# Patient Record
Sex: Male | Born: 1950 | Race: White | Hispanic: No | Marital: Married | Smoking: Former smoker
Health system: Southern US, Community
[De-identification: ages and names within clinical notes are randomized; demographics above are authoritative.]

## PROBLEM LIST (undated history)

## (undated) DIAGNOSIS — I499 Cardiac arrhythmia, unspecified: Secondary | ICD-10-CM

## (undated) DIAGNOSIS — T7840XA Allergy, unspecified, initial encounter: Secondary | ICD-10-CM

## (undated) DIAGNOSIS — I209 Angina pectoris, unspecified: Secondary | ICD-10-CM

## (undated) DIAGNOSIS — N400 Enlarged prostate without lower urinary tract symptoms: Secondary | ICD-10-CM

## (undated) DIAGNOSIS — I4891 Unspecified atrial fibrillation: Secondary | ICD-10-CM

## (undated) DIAGNOSIS — J309 Allergic rhinitis, unspecified: Secondary | ICD-10-CM

## (undated) DIAGNOSIS — F528 Other sexual dysfunction not due to a substance or known physiological condition: Secondary | ICD-10-CM

## (undated) DIAGNOSIS — I219 Acute myocardial infarction, unspecified: Secondary | ICD-10-CM

## (undated) DIAGNOSIS — I10 Essential (primary) hypertension: Secondary | ICD-10-CM

## (undated) DIAGNOSIS — I739 Peripheral vascular disease, unspecified: Secondary | ICD-10-CM

## (undated) DIAGNOSIS — G2581 Restless legs syndrome: Principal | ICD-10-CM

## (undated) DIAGNOSIS — G4700 Insomnia, unspecified: Secondary | ICD-10-CM

## (undated) DIAGNOSIS — J449 Chronic obstructive pulmonary disease, unspecified: Secondary | ICD-10-CM

## (undated) DIAGNOSIS — N3941 Urge incontinence: Secondary | ICD-10-CM

## (undated) DIAGNOSIS — Z86010 Personal history of colonic polyps: Secondary | ICD-10-CM

## (undated) DIAGNOSIS — E039 Hypothyroidism, unspecified: Secondary | ICD-10-CM

## (undated) DIAGNOSIS — K219 Gastro-esophageal reflux disease without esophagitis: Secondary | ICD-10-CM

## (undated) DIAGNOSIS — E785 Hyperlipidemia, unspecified: Secondary | ICD-10-CM

## (undated) DIAGNOSIS — M1710 Unilateral primary osteoarthritis, unspecified knee: Secondary | ICD-10-CM

## (undated) DIAGNOSIS — J189 Pneumonia, unspecified organism: Secondary | ICD-10-CM

## (undated) DIAGNOSIS — R740 Nonspecific elevation of levels of transaminase and lactic acid dehydrogenase [LDH]: Secondary | ICD-10-CM

## (undated) DIAGNOSIS — M1990 Unspecified osteoarthritis, unspecified site: Secondary | ICD-10-CM

## (undated) HISTORY — PX: OTHER SURGICAL HISTORY: SHX169

## (undated) HISTORY — DX: Gastro-esophageal reflux disease without esophagitis: K21.9

## (undated) HISTORY — DX: Unspecified atrial fibrillation: I48.91

## (undated) HISTORY — DX: Hypothyroidism, unspecified: E03.9

## (undated) HISTORY — PX: SHOULDER SURGERY: SHX246

## (undated) HISTORY — DX: Unspecified osteoarthritis, unspecified site: M19.90

## (undated) HISTORY — DX: Allergic rhinitis, unspecified: J30.9

## (undated) HISTORY — PX: UPPER GASTROINTESTINAL ENDOSCOPY: SHX188

## (undated) HISTORY — DX: Insomnia, unspecified: G47.00

## (undated) HISTORY — DX: Benign prostatic hyperplasia without lower urinary tract symptoms: N40.0

## (undated) HISTORY — DX: Allergy, unspecified, initial encounter: T78.40XA

## (undated) HISTORY — DX: Restless legs syndrome: G25.81

## (undated) HISTORY — PX: COLONOSCOPY: SHX174

## (undated) HISTORY — DX: Unilateral primary osteoarthritis, unspecified knee: M17.10

## (undated) HISTORY — PX: POLYPECTOMY: SHX149

## (undated) HISTORY — DX: Personal history of colonic polyps: Z86.010

## (undated) HISTORY — DX: Urge incontinence: N39.41

## (undated) HISTORY — PX: HERNIA REPAIR: SHX51

## (undated) HISTORY — DX: Other sexual dysfunction not due to a substance or known physiological condition: F52.8

## (undated) HISTORY — DX: Hyperlipidemia, unspecified: E78.5

## (undated) HISTORY — DX: Nonspecific elevation of levels of transaminase and lactic acid dehydrogenase (ldh): R74.0

---

## 1993-12-28 LAB — HM SIGMOIDOSCOPY: HM Sigmoidoscopy: NORMAL

## 2000-07-31 ENCOUNTER — Ambulatory Visit (HOSPITAL_COMMUNITY): Admission: RE | Admit: 2000-07-31 | Discharge: 2000-07-31 | Payer: Self-pay | Admitting: Rheumatology

## 2000-07-31 ENCOUNTER — Encounter: Payer: Self-pay | Admitting: Rheumatology

## 2003-10-03 ENCOUNTER — Inpatient Hospital Stay (HOSPITAL_COMMUNITY): Admission: EM | Admit: 2003-10-03 | Discharge: 2003-10-05 | Payer: Self-pay | Admitting: *Deleted

## 2003-10-07 ENCOUNTER — Ambulatory Visit (HOSPITAL_COMMUNITY): Admission: RE | Admit: 2003-10-07 | Discharge: 2003-10-07 | Payer: Self-pay | Admitting: Internal Medicine

## 2004-03-10 ENCOUNTER — Emergency Department (HOSPITAL_COMMUNITY): Admission: EM | Admit: 2004-03-10 | Discharge: 2004-03-10 | Payer: Self-pay | Admitting: Emergency Medicine

## 2004-10-30 ENCOUNTER — Encounter: Admission: RE | Admit: 2004-10-30 | Discharge: 2004-10-30 | Payer: Self-pay | Admitting: Orthopedic Surgery

## 2004-11-24 ENCOUNTER — Encounter: Admission: RE | Admit: 2004-11-24 | Discharge: 2004-11-24 | Payer: Self-pay | Admitting: Orthopedic Surgery

## 2005-01-18 ENCOUNTER — Ambulatory Visit: Payer: Self-pay | Admitting: Internal Medicine

## 2005-01-22 ENCOUNTER — Ambulatory Visit: Payer: Self-pay | Admitting: Cardiology

## 2006-03-13 ENCOUNTER — Ambulatory Visit: Payer: Self-pay | Admitting: Internal Medicine

## 2006-03-18 ENCOUNTER — Ambulatory Visit: Payer: Self-pay | Admitting: Internal Medicine

## 2006-03-27 ENCOUNTER — Ambulatory Visit: Payer: Self-pay

## 2007-04-29 ENCOUNTER — Ambulatory Visit: Payer: Self-pay | Admitting: Internal Medicine

## 2007-04-29 LAB — CONVERTED CEMR LAB
ALT: 19 units/L (ref 0–53)
AST: 20 units/L (ref 0–37)
Albumin: 3.9 g/dL (ref 3.5–5.2)
Alkaline Phosphatase: 90 units/L (ref 39–117)
BUN: 7 mg/dL (ref 6–23)
Basophils Absolute: 0.1 10*3/uL (ref 0.0–0.1)
Basophils Relative: 1 % (ref 0.0–1.0)
Bilirubin Urine: NEGATIVE
Bilirubin, Direct: 0.1 mg/dL (ref 0.0–0.3)
CO2: 27 meq/L (ref 19–32)
Calcium: 9.4 mg/dL (ref 8.4–10.5)
Chloride: 104 meq/L (ref 96–112)
Cholesterol: 227 mg/dL (ref 0–200)
Creatinine, Ser: 0.8 mg/dL (ref 0.4–1.5)
Direct LDL: 123.7 mg/dL
Eosinophils Absolute: 0.2 10*3/uL (ref 0.0–0.6)
Eosinophils Relative: 2 % (ref 0.0–5.0)
GFR calc Af Amer: 129 mL/min
GFR calc non Af Amer: 107 mL/min
Glucose, Bld: 98 mg/dL (ref 70–99)
HCT: 44.1 % (ref 39.0–52.0)
HDL: 34.7 mg/dL — ABNORMAL LOW (ref >39.0)
Hemoglobin, Urine: NEGATIVE
Hemoglobin: 15.4 g/dL (ref 13.0–17.0)
Ketones, ur: NEGATIVE mg/dL
Leukocytes, UA: NEGATIVE
Lymphocytes Relative: 34.5 % (ref 12.0–46.0)
MCHC: 34.9 g/dL (ref 30.0–36.0)
MCV: 92.1 fL (ref 78.0–100.0)
Monocytes Absolute: 0.8 10*3/uL — ABNORMAL HIGH (ref 0.2–0.7)
Monocytes Relative: 9.5 % (ref 3.0–11.0)
Neutro Abs: 4.1 10*3/uL (ref 1.4–7.7)
Neutrophils Relative %: 53 % (ref 43.0–77.0)
Nitrite: NEGATIVE
PSA: 0.75 ng/mL (ref 0.10–4.00)
Platelets: 285 10*3/uL (ref 150–400)
Potassium: 4.3 meq/L (ref 3.5–5.1)
RBC: 4.79 M/uL (ref 4.22–5.81)
RDW: 12.2 % (ref 11.5–14.6)
Sodium: 139 meq/L (ref 135–145)
Specific Gravity, Urine: 1.01 (ref 1.000–1.03)
TSH: 22.58 microintl units/mL — ABNORMAL HIGH (ref 0.35–5.50)
Total Bilirubin: 0.7 mg/dL (ref 0.3–1.2)
Total CHOL/HDL Ratio: 6.5
Total Protein, Urine: NEGATIVE mg/dL
Total Protein: 7.1 g/dL (ref 6.0–8.3)
Triglycerides: 391 mg/dL (ref 0–149)
Urine Glucose: NEGATIVE mg/dL
Urobilinogen, UA: 0.2 (ref 0.0–1.0)
VLDL: 78 mg/dL — ABNORMAL HIGH (ref 0–40)
WBC: 8 10*3/uL (ref 4.5–10.5)
pH: 6.5 (ref 5.0–8.0)

## 2007-05-06 ENCOUNTER — Ambulatory Visit: Payer: Self-pay | Admitting: Internal Medicine

## 2007-05-06 DIAGNOSIS — R5381 Other malaise: Secondary | ICD-10-CM | POA: Insufficient documentation

## 2007-05-06 DIAGNOSIS — E039 Hypothyroidism, unspecified: Secondary | ICD-10-CM

## 2007-05-06 DIAGNOSIS — IMO0002 Reserved for concepts with insufficient information to code with codable children: Secondary | ICD-10-CM

## 2007-05-06 DIAGNOSIS — M545 Low back pain, unspecified: Secondary | ICD-10-CM | POA: Insufficient documentation

## 2007-05-06 DIAGNOSIS — J309 Allergic rhinitis, unspecified: Secondary | ICD-10-CM | POA: Insufficient documentation

## 2007-05-06 DIAGNOSIS — K219 Gastro-esophageal reflux disease without esophagitis: Secondary | ICD-10-CM | POA: Insufficient documentation

## 2007-05-06 DIAGNOSIS — E785 Hyperlipidemia, unspecified: Secondary | ICD-10-CM | POA: Insufficient documentation

## 2007-05-06 DIAGNOSIS — M1710 Unilateral primary osteoarthritis, unspecified knee: Secondary | ICD-10-CM

## 2007-05-06 DIAGNOSIS — F528 Other sexual dysfunction not due to a substance or known physiological condition: Secondary | ICD-10-CM | POA: Insufficient documentation

## 2007-05-06 DIAGNOSIS — R5383 Other fatigue: Secondary | ICD-10-CM

## 2007-05-06 HISTORY — DX: Other sexual dysfunction not due to a substance or known physiological condition: F52.8

## 2007-05-06 HISTORY — DX: Allergic rhinitis, unspecified: J30.9

## 2007-05-06 HISTORY — DX: Gastro-esophageal reflux disease without esophagitis: K21.9

## 2007-05-06 HISTORY — DX: Hyperlipidemia, unspecified: E78.5

## 2007-05-06 HISTORY — DX: Reserved for concepts with insufficient information to code with codable children: IMO0002

## 2007-05-06 HISTORY — DX: Hypothyroidism, unspecified: E03.9

## 2007-05-13 ENCOUNTER — Telehealth (INDEPENDENT_AMBULATORY_CARE_PROVIDER_SITE_OTHER): Payer: Self-pay | Admitting: *Deleted

## 2007-05-15 ENCOUNTER — Ambulatory Visit: Payer: Self-pay | Admitting: Gastroenterology

## 2007-05-23 ENCOUNTER — Telehealth (INDEPENDENT_AMBULATORY_CARE_PROVIDER_SITE_OTHER): Payer: Self-pay | Admitting: *Deleted

## 2007-05-27 ENCOUNTER — Encounter: Payer: Self-pay | Admitting: Internal Medicine

## 2007-05-27 ENCOUNTER — Encounter: Payer: Self-pay | Admitting: Gastroenterology

## 2007-05-27 ENCOUNTER — Ambulatory Visit: Payer: Self-pay | Admitting: Gastroenterology

## 2007-05-29 ENCOUNTER — Telehealth (INDEPENDENT_AMBULATORY_CARE_PROVIDER_SITE_OTHER): Payer: Self-pay | Admitting: *Deleted

## 2007-05-29 ENCOUNTER — Ambulatory Visit: Payer: Self-pay | Admitting: Internal Medicine

## 2007-05-29 DIAGNOSIS — R0989 Other specified symptoms and signs involving the circulatory and respiratory systems: Secondary | ICD-10-CM

## 2007-05-29 DIAGNOSIS — R0609 Other forms of dyspnea: Secondary | ICD-10-CM | POA: Insufficient documentation

## 2007-05-29 LAB — CONVERTED CEMR LAB
ALT: 19 units/L (ref 0–53)
AST: 21 units/L (ref 0–37)
Albumin: 3.9 g/dL (ref 3.5–5.2)
Alkaline Phosphatase: 95 units/L (ref 39–117)
BUN: 6 mg/dL (ref 6–23)
Basophils Absolute: 0 10*3/uL (ref 0.0–0.1)
Basophils Relative: 0.6 % (ref 0.0–1.0)
Bilirubin, Direct: 0.2 mg/dL (ref 0.0–0.3)
CO2: 29 meq/L (ref 19–32)
Calcium: 9.6 mg/dL (ref 8.4–10.5)
Chloride: 102 meq/L (ref 96–112)
Cholesterol: 183 mg/dL (ref 0–200)
Creatinine, Ser: 0.9 mg/dL (ref 0.4–1.5)
Eosinophils Absolute: 0.1 10*3/uL (ref 0.0–0.6)
Eosinophils Relative: 1.3 % (ref 0.0–5.0)
GFR calc Af Amer: 113 mL/min
GFR calc non Af Amer: 93 mL/min
Glucose, Bld: 88 mg/dL (ref 70–99)
HCT: 43.7 % (ref 39.0–52.0)
HDL: 32 mg/dL — ABNORMAL LOW (ref >39.0)
Hemoglobin: 15.7 g/dL (ref 13.0–17.0)
LDL Cholesterol: 112 mg/dL — ABNORMAL HIGH (ref 0–99)
Lymphocytes Relative: 35.9 % (ref 12.0–46.0)
MCHC: 35.8 g/dL (ref 30.0–36.0)
MCV: 90.7 fL (ref 78.0–100.0)
Monocytes Absolute: 0.9 10*3/uL — ABNORMAL HIGH (ref 0.2–0.7)
Monocytes Relative: 14.7 % — ABNORMAL HIGH (ref 3.0–11.0)
Neutro Abs: 3 10*3/uL (ref 1.4–7.7)
Neutrophils Relative %: 47.5 % (ref 43.0–77.0)
Platelets: 225 10*3/uL (ref 150–400)
Potassium: 3.9 meq/L (ref 3.5–5.1)
RBC: 4.82 M/uL (ref 4.22–5.81)
RDW: 11.8 % (ref 11.5–14.6)
Sodium: 138 meq/L (ref 135–145)
TSH: 10.87 microintl units/mL — ABNORMAL HIGH (ref 0.35–5.50)
Total Bilirubin: 0.7 mg/dL (ref 0.3–1.2)
Total CHOL/HDL Ratio: 5.7
Total Protein: 7.1 g/dL (ref 6.0–8.3)
Triglycerides: 195 mg/dL — ABNORMAL HIGH (ref 0–149)
VLDL: 39 mg/dL (ref 0–40)
WBC: 6.2 10*3/uL (ref 4.5–10.5)

## 2007-06-03 ENCOUNTER — Ambulatory Visit: Payer: Self-pay | Admitting: Internal Medicine

## 2007-06-13 ENCOUNTER — Ambulatory Visit: Payer: Self-pay | Admitting: Internal Medicine

## 2007-06-16 LAB — CONVERTED CEMR LAB: TSH: 8.79 microintl units/mL — ABNORMAL HIGH (ref 0.35–5.50)

## 2007-06-17 ENCOUNTER — Encounter: Payer: Self-pay | Admitting: Internal Medicine

## 2007-07-15 ENCOUNTER — Ambulatory Visit: Payer: Self-pay | Admitting: Gastroenterology

## 2007-07-29 ENCOUNTER — Ambulatory Visit: Payer: Self-pay | Admitting: Gastroenterology

## 2007-07-29 ENCOUNTER — Encounter: Payer: Self-pay | Admitting: Internal Medicine

## 2007-07-29 ENCOUNTER — Encounter: Payer: Self-pay | Admitting: Gastroenterology

## 2007-11-06 ENCOUNTER — Encounter: Payer: Self-pay | Admitting: Internal Medicine

## 2008-01-01 ENCOUNTER — Ambulatory Visit: Payer: Self-pay | Admitting: Internal Medicine

## 2008-01-01 ENCOUNTER — Telehealth (INDEPENDENT_AMBULATORY_CARE_PROVIDER_SITE_OTHER): Payer: Self-pay | Admitting: *Deleted

## 2008-01-01 DIAGNOSIS — R1011 Right upper quadrant pain: Secondary | ICD-10-CM | POA: Insufficient documentation

## 2008-01-02 ENCOUNTER — Ambulatory Visit (HOSPITAL_COMMUNITY): Admission: RE | Admit: 2008-01-02 | Discharge: 2008-01-02 | Payer: Self-pay | Admitting: Internal Medicine

## 2008-01-02 LAB — CONVERTED CEMR LAB
ALT: 20 units/L (ref 0–53)
AST: 20 units/L (ref 0–37)
Albumin: 4.1 g/dL (ref 3.5–5.2)
Alkaline Phosphatase: 92 units/L (ref 39–117)
BUN: 7 mg/dL (ref 6–23)
Basophils Absolute: 0.2 10*3/uL — ABNORMAL HIGH (ref 0.0–0.1)
Basophils Relative: 2.2 % (ref 0.0–3.0)
Bilirubin Urine: NEGATIVE
Bilirubin, Direct: 0.1 mg/dL (ref 0.0–0.3)
CO2: 27 meq/L (ref 19–32)
Calcium: 9.3 mg/dL (ref 8.4–10.5)
Chloride: 101 meq/L (ref 96–112)
Creatinine, Ser: 0.7 mg/dL (ref 0.4–1.5)
Crystals: NEGATIVE
Eosinophils Absolute: 0.1 10*3/uL (ref 0.0–0.7)
Eosinophils Relative: 1.3 % (ref 0.0–5.0)
GFR calc Af Amer: 150 mL/min
GFR calc non Af Amer: 124 mL/min
Glucose, Bld: 117 mg/dL — ABNORMAL HIGH (ref 70–99)
HCT: 46.3 % (ref 39.0–52.0)
Hemoglobin: 16 g/dL (ref 13.0–17.0)
Ketones, ur: NEGATIVE mg/dL
Lipase: 26 units/L (ref 11.0–59.0)
Lymphocytes Relative: 32.4 % (ref 12.0–46.0)
MCHC: 34.5 g/dL (ref 30.0–36.0)
MCV: 92.2 fL (ref 78.0–100.0)
Monocytes Absolute: 0.6 10*3/uL (ref 0.1–1.0)
Monocytes Relative: 6 % (ref 3.0–12.0)
Neutro Abs: 5.3 10*3/uL (ref 1.4–7.7)
Neutrophils Relative %: 58.1 % (ref 43.0–77.0)
Nitrite: NEGATIVE
Platelets: 282 10*3/uL (ref 150–400)
Potassium: 3.6 meq/L (ref 3.5–5.1)
RBC: 5.02 M/uL (ref 4.22–5.81)
RDW: 12.4 % (ref 11.5–14.6)
Sodium: 137 meq/L (ref 135–145)
Specific Gravity, Urine: >=1.03 (ref 1.000–1.03)
Squamous Epithelial / LPF: NEGATIVE /lpf
Total Bilirubin: 0.8 mg/dL (ref 0.3–1.2)
Total Protein, Urine: NEGATIVE mg/dL
Total Protein: 7 g/dL (ref 6.0–8.3)
Urine Glucose: NEGATIVE mg/dL
Urobilinogen, UA: 0.2 (ref 0.0–1.0)
WBC: 9.2 10*3/uL (ref 4.5–10.5)
pH: 6 (ref 5.0–8.0)

## 2008-05-25 ENCOUNTER — Ambulatory Visit: Payer: Self-pay | Admitting: Internal Medicine

## 2008-05-25 LAB — CONVERTED CEMR LAB
ALT: 17 units/L (ref 0–53)
AST: 22 units/L (ref 0–37)
Albumin: 3.9 g/dL (ref 3.5–5.2)
Alkaline Phosphatase: 95 units/L (ref 39–117)
BUN: 6 mg/dL (ref 6–23)
Basophils Absolute: 0.1 10*3/uL (ref 0.0–0.1)
Basophils Relative: 0.8 % (ref 0.0–3.0)
Bilirubin Urine: NEGATIVE
Bilirubin, Direct: 0.1 mg/dL (ref 0.0–0.3)
CO2: 30 meq/L (ref 19–32)
Calcium: 9.9 mg/dL (ref 8.4–10.5)
Chloride: 105 meq/L (ref 96–112)
Cholesterol: 251 mg/dL (ref 0–200)
Creatinine, Ser: 0.7 mg/dL (ref 0.4–1.5)
Direct LDL: 143.7 mg/dL
Eosinophils Absolute: 0.1 10*3/uL (ref 0.0–0.7)
Eosinophils Relative: 1.3 % (ref 0.0–5.0)
Folate: 7.6 ng/mL
GFR calc Af Amer: 150 mL/min
GFR calc non Af Amer: 124 mL/min
Glucose, Bld: 87 mg/dL (ref 70–99)
HCT: 46.9 % (ref 39.0–52.0)
HDL: 38.3 mg/dL — ABNORMAL LOW (ref >39.0)
Hemoglobin: 16.2 g/dL (ref 13.0–17.0)
Ketones, ur: NEGATIVE mg/dL
Leukocytes, UA: NEGATIVE
Lymphocytes Relative: 32.2 % (ref 12.0–46.0)
MCHC: 34.5 g/dL (ref 30.0–36.0)
MCV: 92.4 fL (ref 78.0–100.0)
Monocytes Absolute: 0.8 10*3/uL (ref 0.1–1.0)
Monocytes Relative: 9.7 % (ref 3.0–12.0)
Neutro Abs: 4.6 10*3/uL (ref 1.4–7.7)
Neutrophils Relative %: 56 % (ref 43.0–77.0)
Nitrite: NEGATIVE
PSA: 0.74 ng/mL (ref 0.10–4.00)
Platelets: 240 10*3/uL (ref 150–400)
Potassium: 4.3 meq/L (ref 3.5–5.1)
RBC: 5.07 M/uL (ref 4.22–5.81)
RDW: 12.2 % (ref 11.5–14.6)
Sodium: 141 meq/L (ref 135–145)
Specific Gravity, Urine: <=1.005 (ref 1.000–1.03)
TSH: 2.64 microintl units/mL (ref 0.35–5.50)
Total Bilirubin: 0.6 mg/dL (ref 0.3–1.2)
Total CHOL/HDL Ratio: 6.6
Total Protein, Urine: NEGATIVE mg/dL
Total Protein: 6.9 g/dL (ref 6.0–8.3)
Triglycerides: 371 mg/dL (ref 0–149)
Urine Glucose: NEGATIVE mg/dL
Urobilinogen, UA: 0.2 (ref 0.0–1.0)
VLDL: 74 mg/dL — ABNORMAL HIGH (ref 0–40)
Vitamin B-12: 225 pg/mL (ref 211–911)
WBC: 8.2 10*3/uL (ref 4.5–10.5)
pH: 6 (ref 5.0–8.0)

## 2008-05-26 LAB — CONVERTED CEMR LAB: Vit D, 1,25-Dihydroxy: 31 (ref 30–89)

## 2008-06-30 ENCOUNTER — Ambulatory Visit: Payer: Self-pay | Admitting: Internal Medicine

## 2008-06-30 DIAGNOSIS — R079 Chest pain, unspecified: Secondary | ICD-10-CM | POA: Insufficient documentation

## 2008-07-05 ENCOUNTER — Ambulatory Visit: Payer: Self-pay

## 2008-07-05 ENCOUNTER — Encounter: Payer: Self-pay | Admitting: Internal Medicine

## 2008-07-06 ENCOUNTER — Telehealth: Payer: Self-pay | Admitting: Internal Medicine

## 2008-07-08 ENCOUNTER — Encounter: Payer: Self-pay | Admitting: Internal Medicine

## 2008-07-08 ENCOUNTER — Ambulatory Visit: Payer: Self-pay | Admitting: Internal Medicine

## 2008-07-12 ENCOUNTER — Telehealth: Payer: Self-pay | Admitting: Internal Medicine

## 2008-07-20 ENCOUNTER — Ambulatory Visit: Payer: Self-pay | Admitting: Gastroenterology

## 2008-07-20 DIAGNOSIS — Z860100 Personal history of colon polyps, unspecified: Secondary | ICD-10-CM

## 2008-07-20 HISTORY — DX: Personal history of colonic polyps: Z86.010

## 2008-07-20 HISTORY — DX: Personal history of colon polyps, unspecified: Z86.0100

## 2008-07-30 ENCOUNTER — Ambulatory Visit: Payer: Self-pay | Admitting: Gastroenterology

## 2008-07-30 ENCOUNTER — Ambulatory Visit: Payer: Self-pay | Admitting: Internal Medicine

## 2008-07-30 ENCOUNTER — Inpatient Hospital Stay (HOSPITAL_COMMUNITY): Admission: EM | Admit: 2008-07-30 | Discharge: 2008-07-31 | Payer: Self-pay | Admitting: Emergency Medicine

## 2008-07-30 LAB — HM COLONOSCOPY

## 2008-08-03 ENCOUNTER — Telehealth (INDEPENDENT_AMBULATORY_CARE_PROVIDER_SITE_OTHER): Payer: Self-pay | Admitting: *Deleted

## 2008-08-04 ENCOUNTER — Ambulatory Visit: Payer: Self-pay | Admitting: Gastroenterology

## 2008-08-04 ENCOUNTER — Encounter: Payer: Self-pay | Admitting: Gastroenterology

## 2008-08-04 ENCOUNTER — Telehealth: Payer: Self-pay | Admitting: Gastroenterology

## 2008-08-04 DIAGNOSIS — R7401 Elevation of levels of liver transaminase levels: Secondary | ICD-10-CM | POA: Insufficient documentation

## 2008-08-04 DIAGNOSIS — R7402 Elevation of levels of lactic acid dehydrogenase (LDH): Secondary | ICD-10-CM | POA: Insufficient documentation

## 2008-08-04 DIAGNOSIS — R740 Nonspecific elevation of levels of transaminase and lactic acid dehydrogenase [LDH]: Secondary | ICD-10-CM

## 2008-08-04 DIAGNOSIS — R1013 Epigastric pain: Secondary | ICD-10-CM | POA: Insufficient documentation

## 2008-08-04 HISTORY — DX: Elevation of levels of lactic acid dehydrogenase (LDH): R74.02

## 2008-08-04 HISTORY — DX: Elevation of levels of liver transaminase levels: R74.01

## 2008-08-04 LAB — CONVERTED CEMR LAB
ALT: 28 units/L (ref 0–53)
AST: 61 units/L — ABNORMAL HIGH (ref 0–37)
Albumin: 3.9 g/dL (ref 3.5–5.2)
Alkaline Phosphatase: 87 units/L (ref 39–117)
Amylase: 72 units/L (ref 27–131)
BUN: 3 mg/dL — ABNORMAL LOW (ref 6–23)
Basophils Absolute: 0.1 10*3/uL (ref 0.0–0.1)
Basophils Relative: 0.7 % (ref 0.0–3.0)
CO2: 31 meq/L (ref 19–32)
Calcium: 9.7 mg/dL (ref 8.4–10.5)
Chloride: 102 meq/L (ref 96–112)
Creatinine, Ser: 0.7 mg/dL (ref 0.4–1.5)
Eosinophils Absolute: 0.2 10*3/uL (ref 0.0–0.7)
Eosinophils Relative: 1.5 % (ref 0.0–5.0)
GFR calc Af Amer: 150 mL/min
GFR calc non Af Amer: 124 mL/min
Glucose, Bld: 95 mg/dL (ref 70–99)
HCT: 46.5 % (ref 39.0–52.0)
Hemoglobin: 16.2 g/dL (ref 13.0–17.0)
Lipase: 22 units/L (ref 11.0–59.0)
Lymphocytes Relative: 30 % (ref 12.0–46.0)
MCHC: 34.8 g/dL (ref 30.0–36.0)
MCV: 90.8 fL (ref 78.0–100.0)
Monocytes Absolute: 1.2 10*3/uL — ABNORMAL HIGH (ref 0.1–1.0)
Monocytes Relative: 11.2 % (ref 3.0–12.0)
Neutro Abs: 5.9 10*3/uL (ref 1.4–7.7)
Neutrophils Relative %: 56.6 % (ref 43.0–77.0)
Platelets: 276 10*3/uL (ref 150–400)
Potassium: 4.2 meq/L (ref 3.5–5.1)
RBC: 5.12 M/uL (ref 4.22–5.81)
RDW: 12.4 % (ref 11.5–14.6)
Sodium: 139 meq/L (ref 135–145)
Total Bilirubin: 0.7 mg/dL (ref 0.3–1.2)
Total Protein: 6.9 g/dL (ref 6.0–8.3)
WBC: 10.6 10*3/uL — ABNORMAL HIGH (ref 4.5–10.5)

## 2008-08-05 ENCOUNTER — Encounter: Payer: Self-pay | Admitting: Gastroenterology

## 2008-08-05 ENCOUNTER — Ambulatory Visit (HOSPITAL_COMMUNITY): Admission: RE | Admit: 2008-08-05 | Discharge: 2008-08-05 | Payer: Self-pay | Admitting: Gastroenterology

## 2008-08-06 ENCOUNTER — Encounter: Payer: Self-pay | Admitting: Gastroenterology

## 2008-08-16 ENCOUNTER — Telehealth: Payer: Self-pay | Admitting: Gastroenterology

## 2008-08-17 ENCOUNTER — Ambulatory Visit (HOSPITAL_COMMUNITY): Admission: RE | Admit: 2008-08-17 | Discharge: 2008-08-17 | Payer: Self-pay | Admitting: Gastroenterology

## 2008-08-17 ENCOUNTER — Encounter: Payer: Self-pay | Admitting: Gastroenterology

## 2008-08-27 ENCOUNTER — Encounter: Payer: Self-pay | Admitting: Gastroenterology

## 2008-08-27 ENCOUNTER — Telehealth (INDEPENDENT_AMBULATORY_CARE_PROVIDER_SITE_OTHER): Payer: Self-pay | Admitting: *Deleted

## 2008-09-13 ENCOUNTER — Ambulatory Visit: Payer: Self-pay | Admitting: Internal Medicine

## 2008-09-15 LAB — CONVERTED CEMR LAB
ALT: 19 units/L (ref 0–53)
AST: 20 units/L (ref 0–37)
Albumin: 4.2 g/dL (ref 3.5–5.2)
Alkaline Phosphatase: 97 units/L (ref 39–117)
Bilirubin, Direct: 0.1 mg/dL (ref 0.0–0.3)
Cholesterol: 242 mg/dL — ABNORMAL HIGH (ref 0–200)
Direct LDL: 129.6 mg/dL
HDL: 37.9 mg/dL — ABNORMAL LOW (ref >39.00)
Total Bilirubin: 0.7 mg/dL (ref 0.3–1.2)
Total CHOL/HDL Ratio: 6
Total Protein: 7.2 g/dL (ref 6.0–8.3)
Triglycerides: 358 mg/dL — ABNORMAL HIGH (ref 0.0–149.0)
VLDL: 71.6 mg/dL — ABNORMAL HIGH (ref 0.0–40.0)

## 2009-04-12 ENCOUNTER — Ambulatory Visit: Payer: Self-pay | Admitting: Internal Medicine

## 2009-04-12 DIAGNOSIS — T466X5A Adverse effect of antihyperlipidemic and antiarteriosclerotic drugs, initial encounter: Secondary | ICD-10-CM | POA: Insufficient documentation

## 2009-04-12 DIAGNOSIS — M25469 Effusion, unspecified knee: Secondary | ICD-10-CM | POA: Insufficient documentation

## 2009-04-12 DIAGNOSIS — IMO0001 Reserved for inherently not codable concepts without codable children: Secondary | ICD-10-CM | POA: Insufficient documentation

## 2009-04-12 DIAGNOSIS — G4710 Hypersomnia, unspecified: Secondary | ICD-10-CM | POA: Insufficient documentation

## 2009-04-12 DIAGNOSIS — R350 Frequency of micturition: Secondary | ICD-10-CM | POA: Insufficient documentation

## 2009-04-13 ENCOUNTER — Encounter: Payer: Self-pay | Admitting: Internal Medicine

## 2009-04-13 ENCOUNTER — Encounter (INDEPENDENT_AMBULATORY_CARE_PROVIDER_SITE_OTHER): Payer: Self-pay | Admitting: *Deleted

## 2009-04-14 LAB — CONVERTED CEMR LAB
ALT: 18 units/L (ref 0–53)
AST: 20 units/L (ref 0–37)
Albumin: 4.1 g/dL (ref 3.5–5.2)
Alkaline Phosphatase: 86 units/L (ref 39–117)
BUN: 8 mg/dL (ref 6–23)
Basophils Absolute: 0 10*3/uL (ref 0.0–0.1)
Basophils Relative: 0 % (ref 0.0–3.0)
Bilirubin Urine: NEGATIVE
Bilirubin, Direct: 0.1 mg/dL (ref 0.0–0.3)
CO2: 27 meq/L (ref 19–32)
Calcium: 9.6 mg/dL (ref 8.4–10.5)
Chloride: 101 meq/L (ref 96–112)
Cholesterol: 247 mg/dL — ABNORMAL HIGH (ref 0–200)
Creatinine, Ser: 0.9 mg/dL (ref 0.4–1.5)
Direct LDL: 154.3 mg/dL
Eosinophils Absolute: 0.1 10*3/uL (ref 0.0–0.7)
Eosinophils Relative: 1 % (ref 0.0–5.0)
Folate: 6.5 ng/mL
GFR calc non Af Amer: 92.27 mL/min (ref >60)
Glucose, Bld: 84 mg/dL (ref 70–99)
HCT: 45.5 % (ref 39.0–52.0)
HDL: 36.3 mg/dL — ABNORMAL LOW (ref >39.00)
Hemoglobin: 15.7 g/dL (ref 13.0–17.0)
Iron: 101 ug/dL (ref 42–165)
Ketones, ur: NEGATIVE mg/dL
Leukocytes, UA: NEGATIVE
Lymphocytes Relative: 22 % (ref 12.0–46.0)
Lymphs Abs: 2.1 10*3/uL (ref 0.7–4.0)
MCHC: 34.6 g/dL (ref 30.0–36.0)
MCV: 94.6 fL (ref 78.0–100.0)
Monocytes Absolute: 0.6 10*3/uL (ref 0.1–1.0)
Monocytes Relative: 6.3 % (ref 3.0–12.0)
Neutro Abs: 6.8 10*3/uL (ref 1.4–7.7)
Neutrophils Relative %: 70.7 % (ref 43.0–77.0)
Nitrite: NEGATIVE
PSA: 0.86 ng/mL (ref 0.10–4.00)
Platelets: 243 10*3/uL (ref 150.0–400.0)
Potassium: 5.2 meq/L — ABNORMAL HIGH (ref 3.5–5.1)
RBC: 4.8 M/uL (ref 4.22–5.81)
RDW: 11.9 % (ref 11.5–14.6)
Saturation Ratios: 25.7 % (ref 20.0–50.0)
Sed Rate: 10 mm/hr (ref 0–22)
Sodium: 137 meq/L (ref 135–145)
Specific Gravity, Urine: <=1.005 (ref 1.000–1.030)
TSH: 7.54 microintl units/mL — ABNORMAL HIGH (ref 0.35–5.50)
Total Bilirubin: 0.6 mg/dL (ref 0.3–1.2)
Total CHOL/HDL Ratio: 7
Total CK: 96 units/L (ref 7–232)
Total Protein, Urine: NEGATIVE mg/dL
Total Protein: 7.1 g/dL (ref 6.0–8.3)
Transferrin: 281.2 mg/dL (ref 212.0–360.0)
Triglycerides: 316 mg/dL — ABNORMAL HIGH (ref 0.0–149.0)
Urine Glucose: NEGATIVE mg/dL
Urobilinogen, UA: 0.2 (ref 0.0–1.0)
VLDL: 63.2 mg/dL — ABNORMAL HIGH (ref 0.0–40.0)
Vitamin B-12: 212 pg/mL (ref 211–911)
WBC: 9.6 10*3/uL (ref 4.5–10.5)
pH: 6.5 (ref 5.0–8.0)

## 2009-05-13 ENCOUNTER — Ambulatory Visit: Payer: Self-pay | Admitting: Internal Medicine

## 2009-05-17 LAB — CONVERTED CEMR LAB
Cholesterol: 235 mg/dL — ABNORMAL HIGH (ref 0–200)
Direct LDL: 176 mg/dL
HDL: 39.9 mg/dL (ref >39.00)
TSH: 3.89 microintl units/mL (ref 0.35–5.50)
Total CHOL/HDL Ratio: 6
Triglycerides: 171 mg/dL — ABNORMAL HIGH (ref 0.0–149.0)
VLDL: 34.2 mg/dL (ref 0.0–40.0)

## 2009-08-22 ENCOUNTER — Ambulatory Visit: Payer: Self-pay | Admitting: Internal Medicine

## 2009-08-22 DIAGNOSIS — B029 Zoster without complications: Secondary | ICD-10-CM | POA: Insufficient documentation

## 2010-04-18 ENCOUNTER — Ambulatory Visit: Payer: Self-pay | Admitting: Internal Medicine

## 2010-04-18 ENCOUNTER — Encounter: Payer: Self-pay | Admitting: Internal Medicine

## 2010-04-18 DIAGNOSIS — R634 Abnormal weight loss: Secondary | ICD-10-CM | POA: Insufficient documentation

## 2010-04-18 LAB — CONVERTED CEMR LAB
ALT: 17 units/L (ref 0–53)
AST: 20 units/L (ref 0–37)
Albumin: 4.1 g/dL (ref 3.5–5.2)
Alkaline Phosphatase: 87 units/L (ref 39–117)
BUN: 14 mg/dL (ref 6–23)
Basophils Absolute: 0.1 10*3/uL (ref 0.0–0.1)
Basophils Relative: 0.9 % (ref 0.0–3.0)
Bilirubin Urine: NEGATIVE
Bilirubin, Direct: 0.1 mg/dL (ref 0.0–0.3)
CO2: 27 meq/L (ref 19–32)
Calcium: 9.6 mg/dL (ref 8.4–10.5)
Chloride: 104 meq/L (ref 96–112)
Cholesterol: 229 mg/dL — ABNORMAL HIGH (ref 0–200)
Creatinine, Ser: 0.8 mg/dL (ref 0.4–1.5)
Direct LDL: 178 mg/dL
Eosinophils Absolute: 0.1 10*3/uL (ref 0.0–0.7)
Eosinophils Relative: 1.4 % (ref 0.0–5.0)
GFR calc non Af Amer: 102.37 mL/min (ref >60)
Glucose, Bld: 86 mg/dL (ref 70–99)
HCT: 48.3 % (ref 39.0–52.0)
HDL: 41.4 mg/dL (ref >39.00)
Hemoglobin: 16.5 g/dL (ref 13.0–17.0)
Ketones, ur: 15 mg/dL
Leukocytes, UA: NEGATIVE
Lymphocytes Relative: 25.7 % (ref 12.0–46.0)
Lymphs Abs: 2.5 10*3/uL (ref 0.7–4.0)
MCHC: 34.1 g/dL (ref 30.0–36.0)
MCV: 94.3 fL (ref 78.0–100.0)
Monocytes Absolute: 0.7 10*3/uL (ref 0.1–1.0)
Monocytes Relative: 7.4 % (ref 3.0–12.0)
Neutro Abs: 6.2 10*3/uL (ref 1.4–7.7)
Neutrophils Relative %: 64.6 % (ref 43.0–77.0)
Nitrite: NEGATIVE
PSA: 0.71 ng/mL (ref 0.10–4.00)
Platelets: 264 10*3/uL (ref 150.0–400.0)
Potassium: 4.5 meq/L (ref 3.5–5.1)
RBC: 5.12 M/uL (ref 4.22–5.81)
RDW: 13.3 % (ref 11.5–14.6)
Sodium: 139 meq/L (ref 135–145)
Specific Gravity, Urine: 1.025 (ref 1.000–1.030)
TSH: 5.11 microintl units/mL (ref 0.35–5.50)
Total Bilirubin: 0.7 mg/dL (ref 0.3–1.2)
Total CHOL/HDL Ratio: 6
Total Protein, Urine: NEGATIVE mg/dL
Total Protein: 6.6 g/dL (ref 6.0–8.3)
Triglycerides: 88 mg/dL (ref 0.0–149.0)
Urine Glucose: NEGATIVE mg/dL
Urobilinogen, UA: 0.2 (ref 0.0–1.0)
VLDL: 17.6 mg/dL (ref 0.0–40.0)
WBC: 9.7 10*3/uL (ref 4.5–10.5)
pH: 5.5 (ref 5.0–8.0)

## 2010-07-02 ENCOUNTER — Encounter: Payer: Self-pay | Admitting: Orthopedic Surgery

## 2010-07-13 NOTE — Progress Notes (Signed)
  Phone Note Outgoing Call   Summary of Call: LMOPT - labs negative, normal, or stable  - No Acute problem  - stress test normal Initial call taken by: Corwin Levins MD,  July 06, 2008 11:53 AM

## 2010-07-13 NOTE — Progress Notes (Signed)
Summary: chest cold  Phone Note Call from Patient Call back at Home Phone [phone]   Caller: Spouse Call For: dr Jonny Ruiz Reason for Call: Acute Illness Complaint: Cough/Sore throat, Breathing Problems Summary of Call: c/o chest cold ,running a fever of 102 last night , wheezing when he breath requesting to be seen today Initial call taken by: Shelbie Proctor,  May 29, 2007 9:05 AM  Follow-up for Phone Call        ok for addon today Follow-up by: Corwin Levins MD,  May 29, 2007 9:10 AM  Additional Follow-up for Phone Call Additional follow up Details #1::        Phone Call Completed, Provider Notified, Appt Scheduled Today at 10:00 spoke with pt on his way now dd Additional Follow-up by: Shelbie Proctor,  May 29, 2007 9:37 AM
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
## 2010-07-13 NOTE — Miscellaneous (Signed)
Summary: Orders Update pft charges  Clinical Lists Changes  Orders: Added new Service order of Carbon Monoxide diffusing w/capacity (94720) - Signed Added new Service order of Lung Volumes (94240) - Signed Added new Service order of Spirometry (Pre & Post) (94060) - Signed 
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
## 2010-07-13 NOTE — Progress Notes (Signed)
  Phone Note Call from Patient Call back at Home Phone [phone]   Caller: Patient Call For: DR Jonny Ruiz Summary of Call: PER PT CALL STATED HE MENTION THAT HE WAS GETTING BRONCHITIS AT HIS LAST OFFICE VISIT, NOW HE HAS BRONCHITIS WANT AN RX SENT TO [facility] RDS [phone] Initial call taken by: Shelbie Proctor,  May 23, 2007 3:20 PM  Follow-up for Phone Call        ok to be seen at [facility] tomorrow; we do not normally call in antibx Follow-up by: Corwin Levins MD,  May 23, 2007 4:11 PM  Additional Follow-up for Phone Call Additional follow up Details #1::        Phone Call Completed pt stated he will wait to make an appt with dr Jonny Ruiz May 26, 2007 9:11 AM spoke with pt Additional Follow-up by: Shelbie Proctor,  May 26, 2007 9:11 AM

## 2010-07-13 NOTE — Procedures (Signed)
Summary: Colonoscopy Report/LEB  Colonoscopy Report/LEB   Imported By: Esmeralda Links D'jimraou 06/02/2007 12:19:44  _____________________________________________________________________  External Attachment:    Type:   Image     Comment:   External Document

## 2010-07-13 NOTE — Progress Notes (Signed)
Summary: ? re appt tom   Phone Note Call from Patient Call back at Home Phone [phone]   Caller: spouse patricia Call For: jacobs Reason for Call: Talk to Nurse Summary of Call: Patient has a monometry scheduled for tomorrow but woke up today with a scratchy throat wants to know if thats ok Initial call taken by: Tawni Levy,  August 16, 2008 10:49 AM  Follow-up for Phone Call         Advised ok to go ahead with manometry tomorrow Follow-up by: Teryl Lucy RN,  August 16, 2008 11:30 AM
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
## 2010-07-13 NOTE — Miscellaneous (Signed)
Summary: esophageal manometry  Clinical Lists Changes  Orders: Added new Test order of Manometry (Manometry) - Signed  Appended Document: esophageal manometry manometry completed on 08/17/2008: all peristaltic contractions, no signficantly high esophageal pressures except that RESIDUAL UES pressure was slightly elevated 12 (should be <8).  Not sure this is clincally relevant or that it means his pains are from his esophagus however I would like to try him on levbid 0.375mg  by mouth  two times a day (call him in 60 pills, 2 refils) and have him see me in office in 5-6 weeks.  please call him, thanks

## 2010-07-13 NOTE — Procedures (Signed)
Summary: EGD   EGD  Procedure date:  08/05/2008  Findings:      Location: [facility]    ENDOSCOPY PROCEDURE REPORT  PATIENT:  Guy Morris, Guy Morris  MR#:  [id number] BIRTHDATE:   [birth year]   GENDER:   male  ENDOSCOPIST:   Rachael Fee, MD   PROCEDURE DATE:  08/05/2008 PROCEDURE:  EGD, diagnostic ASA CLASS:   Class II INDICATIONS: abdominal pain , chest pain, jaw pains  MEDICATIONS:   Fentanyl 100 mcg IV, Versed 6 mg IV TOPICAL ANESTHETIC:   Cetacaine Spray  DESCRIPTION OF PROCEDURE:   After the risks benefits and alternatives of the procedure were thoroughly explained, informed consent was obtained.  The [id number] ([id number]) endoscope was introduced through the mouth and advanced to the second portion of the duodenum, without limitations.  The instrument was slowly withdrawn as the mucosa was fully examined. <<PROCEDUREIMAGES>>        <<OLD IMAGES>>  The upper, middle, and distal third of the esophagus were carefully inspected and no abnormalities were noted. The z-line was well seen at the GEJ. The endoscope was pushed into the fundus which was normal including a retroflexed view. The antrum,gastric body, first and second part of the duodenum were unremarkable (see image001, image002, image003, image004, and image005).    Retroflexed views revealed no abnormalities.    The scope was then withdrawn from the patient and the procedure completed.  COMPLICATIONS:   None  ENDOSCOPIC IMPRESSION:  1) Normal EGD   As yet, no explanation for his chest, abd, jaw pains (recent CT scan unrevealing, ultrasound today unrevealing; cbc, cmet normal; colonoscopy with only polyps).    It is not clear that his pains are GI related.  RECOMMENDATIONS:  It is not clear that the pains are GI related.  My office will arrange for esophageal manometry to be done (esophageal dysmotility can cause chest pains).  You should see your PCP to consider other causes outside of GI  tract.  Continue daily antiacid medicine.    _______________________________ Rachael Fee, MD  cc: Oliver Barre, MD  Appended Document: EGD needs esophageal manometry at [facility] dx: chest pains

## 2010-07-13 NOTE — Progress Notes (Signed)
Summary: possible broken rib  Phone Note Call from Patient Call back at Home Phone [phone]   Caller: Patient Call For: dr Jonny Ruiz Summary of Call: per pt call stated he thinks he has a broken rib  pt has an appt with dr Jonny Ruiz today Initial call taken by: Shelbie Proctor,  January 01, 2008 10:28 AM

## 2010-07-13 NOTE — Progress Notes (Signed)
   Phone Note Outgoing Call   Summary of Call: Message left for pt. to call and make ROV appt. for 2-3 wks from time of colon on 07/30/08 Initial call taken by: Teryl Lucy RN,  August 03, 2008 9:43 AM
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
## 2010-07-13 NOTE — Progress Notes (Signed)
  Phone Note Call from Patient Call back at Home Phone [phone]   Caller: [facility] [phone] Summary of Call: Per [facility] received an rx for  levothyroxine 100  mcg and one for 50 mcg  e faxed from the office . which is the correct dosage pt need to be on Patient's chart has been requested.  Initial call taken by: Shelbie Proctor,  May 13, 2007 3:34 PM  Follow-up for Phone Call        the 100 is correct Follow-up by: Corwin Levins MD,  May 13, 2007 5:45 PM  Additional Follow-up for Phone Call Additional follow up Details #1::        called pt pharmacy  349 left msg on machine reguarding the correct doasge Additional Follow-up by: Shelbie Proctor,  May 14, 2007 8:50 AM

## 2010-07-13 NOTE — Progress Notes (Signed)
   Phone Note Outgoing Call   Call placed by: Chales Abrahams CMA,  August 27, 2008 8:31 AM Summary of Call: left message on machine to call back regarding manometry and levbid sent to pharmacy  Follow-up for Phone Call        pt aware meds sent to pharmacy will call to make f/u Follow-up by: Chales Abrahams CMA,  August 27, 2008 3:21 PM
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
## 2010-07-13 NOTE — Progress Notes (Signed)
Summary: Having another problem   Phone Note Call from Patient Call back at Home Phone [phone]   Caller: Elease Hashimoto Call For: Dr Christella Hartigan Reason for Call: Talk to Nurse Summary of Call: Having another problem-wants to talk to [facility]. Initial call taken by: Leanor Kail [facility],  August 04, 2008 10:14 AM  Follow-up for Phone Call        Burning   epigastric pain restarted yesterday around 11am.Pain radiates into,jaws neck and collarbone.Kept him awake last nightHe took 2 Prilosec and ate a half pack  of Rolaids this am and has not gotten any relief.Denies sweating,nausea or shortness  of breath.  Follow-up by: Teryl Lucy RN,  August 04, 2008 10:43 AM  Additional Follow-up for Phone Call Additional follow up Details #1::        needs labs (cbc, cmet), needs to be seen here today. (PA?)  Still needs EGD to be rescheduled.  May need ultrasound to check for biliary issues.  Panic attack's?? Additional Follow-up by: Rachael Fee MD,  August 04, 2008 11:20 AM    Additional Follow-up for Phone Call Additional follow up Details #2::    To be seen by Dr.Stark this afternoon. Labs ordered. Follow-up by: Teryl Lucy RN,  August 04, 2008 1:34 PM
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
## 2010-07-13 NOTE — Progress Notes (Signed)
  Phone Note Outgoing Call   Summary of Call: LMOPT - PFT's normal Initial call taken by: Corwin Levins MD,  July 12, 2008 12:54 PM

## 2010-07-13 NOTE — Consult Note (Signed)
Summary: [facility]  [facility]   Imported By: Lanelle Bal 11/14/2007 14:22:44  _____________________________________________________________________  External Attachment:    Type:   Image     Comment:   External Document

## 2010-07-13 NOTE — Procedures (Signed)
Summary: Esophageal Manometry/[facility]  Esophageal [facility]ometry/[facility]   Imported By: Sherian Rein 08/30/2008 08:14:34  _____________________________________________________________________  External Attachment:    Type:   Image     Comment:   External Document
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
## 2010-07-13 NOTE — Procedures (Signed)
Summary: Colonscopy Report/[facility]  Colonscopy Report/[facility]   Imported By: Esmeralda Links D'jimraou 08/05/2007 13:07:40  _____________________________________________________________________  External Attachment:    Type:   Image     Comment:   External Document

## 2010-07-13 NOTE — Assessment & Plan Note (Signed)
Summary: 4 WK ROA/NML    History of Present Illness: breathing much improved, now finished with meds, still some very slight sob and uses the inhaler infrequently; denies any hyper-hypo thyroid symptoms, reflux, cough  Current Allergies (reviewed today): ! BENADRYL ! VIOXX ! NEURONTIN (GABAPENTIN) Updated/Current Medications (including changes made in today's visit):  LEVOTHROID 100 MCG  TABS (LEVOTHYROXINE SODIUM) 1 by mouth qd LOVASTATIN 40 MG  TABS (LOVASTATIN) 1 by mouth qd TRAMADOL HCL 50 MG TABS (TRAMADOL HCL) Take 1 tablet by mouth every four hours OMEPRAZOLE 20 MG  TBEC (OMEPRAZOLE) 1po qd DARVOCET-N 100 100-650 MG  TABS (PROPOXYPHENE N-APAP) 1 by mouth once daily as needed breakthrough pain ALBUTEROL 90 MCG/ACT  AERS (ALBUTEROL) 2 puffs qid prn   Past Medical History:    Reviewed history from 05/06/2007 and no changes required:       GERD       Hyperlipidemia       right knee djd       Low back pain       Hypothyroidism       E.D.       Allergic rhinitis   Social History:    Reviewed history from 05/06/2007 and no changes required:       Alcohol use-no       Former Smoker     Physical Exam  General:     Well-developed,well-nourished,in no acute distress; alert,appropriate and cooperative throughout examination Head:     Normocephalic and atraumatic without obvious abnormalities. No apparent alopecia or balding. Eyes:     No corneal or conjunctival inflammation noted. EOMI. Perrla.  Ears:     External ear exam shows no significant lesions or deformities.  Otoscopic examination reveals clear canals, tympanic membranes are intact bilaterally without bulging, retraction, inflammation or discharge. Hearing is grossly normal bilaterally. Nose:     External nasal examination shows no deformity or inflammation. Nasal mucosa are pink and moist without lesions or exudates. Mouth:     Oral mucosa and oropharynx without lesions or exudates.  Teeth in good  repair. Neck:     No deformities, masses, or tenderness noted. Lungs:     Normal respiratory effort, chest expands symmetrically. Lungs are clear to auscultation, no crackles or wheezes. Heart:     Normal rate and regular rhythm. S1 and S2 normal without gallop, murmur, click, rub or other extra sounds. Extremities:     no edema    Impression & Recommendations:  Problem # 1:  BRONCHITIS-ACUTE (ICD-466.0)  The following medications were removed from the medication list:    Levaquin 500 Mg Tabs (Levofloxacin) .Marland Kitchen... 1 by mouth qd    Tussionex Pennkinetic Er 8-10 Mg/40ml Lqcr (Chlorpheniramine-hydrocodone) .Marland Kitchen... 1 tsp po two times a day prn  His updated medication list for this problem includes:    Albuterol 90 Mcg/act Aers (Albuterol) .Marland Kitchen... 2 puffs qid prn    Problem # 2:  HYPOTHYROIDISM (ICD-244.9)  His updated medication list for this problem includes:    Levothroid 100 Mcg Tabs (Levothyroxine sodium) .Marland Kitchen... 1 by mouth qd  on new thyroid dosing, now out 4 wks on the new dose - to re-checke tsh Orders: TLB-TSH (Thyroid Stimulating Hormone) (84443-TSH)  Labs Reviewed: TSH: 10.87 (05/29/2007)    Chol: 183 (05/29/2007)   HDL: 32.0 (05/29/2007)   LDL: 112 (05/29/2007)   TG: 195 (05/29/2007)   Problem # 3:  HYPERLIPIDEMIA (ICD-272.4)  His updated medication list for this problem includes:    Lovastatin 40 Mg  Tabs (Lovastatin) .Marland Kitchen... 1 by mouth qd  Labs Reviewed: Chol: 183 (05/29/2007)   HDL: 32.0 (05/29/2007)   LDL: 112 (05/29/2007)   TG: 195 (05/29/2007) SGOT: 21 (05/29/2007)   SGPT: 19 (05/29/2007)   Problem # 4:  GERD (ICD-530.81)  His updated medication list for this problem includes:    Omeprazole 20 Mg Tbec (Omeprazole) .Marland Kitchen... 1po qd  Diagnostics Reviewed:  Discussed lifestyle modifications, diet, antacids/medications, and preventive measures. Handout provided.   Complete Medication List: 1)  Levothroid 100 Mcg Tabs (Levothyroxine sodium) .Marland Kitchen.. 1 by mouth qd 2)   Lovastatin 40 Mg Tabs (Lovastatin) .Marland Kitchen.. 1 by mouth qd 3)  Tramadol Hcl 50 Mg Tabs (Tramadol hcl) .... Take 1 tablet by mouth every four hours 4)  Omeprazole 20 Mg Tbec (Omeprazole) .Marland Kitchen.. 1po qd 5)  Darvocet-n 100 100-650 Mg Tabs (Propoxyphene n-apap) .Marland Kitchen.. 1 by mouth once daily as needed breakthrough pain 6)  Albuterol 90 Mcg/act Aers (Albuterol) .... 2 puffs qid prn   Patient Instructions: 1)  You will have repeat Thyroid test today - you may need a slightly higher thyroid med dose depending on the result 2)  Please schedule a follow-up appointment in 6 months.    ]
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
## 2010-07-13 NOTE — Letter (Signed)
Summary: [facility] Consult Scheduled Letter  [facility]  [address] [place], [place] [address]   Phone: [phone]  Fax: [phone]      04/13/2009 MRN: [id number]  [facility] [address] [place], [place]  [address]    Dear Mr. STRACENER,      We have scheduled an appointment for you.  At the recommendation of Dr.John, we have scheduled you a consult with Dr Shelle Iron on 04/22/09 at 2:15pm.  Their phone number is [phone].  If this appointment day and time is not convenient for you, please feel free to call the office of the doctor you are being referred to at the number listed above and reschedule the appointment.    [facility] [address] [place], [place] [address] *Pulmonary Dept.2nd Floor*   Please give 24hr notice if you need to cancel/reschedule to avoid a $50.00 fee.Also bring insurance card and any co-pay due at time of visit.     Thank you,  Patient Care Coordinator [facility]

## 2010-07-13 NOTE — Miscellaneous (Signed)
Summary: levbid  Clinical Lists Changes  Medications: Changed medication from HYOSCYAMINE SULFATE 0.125 MG SUBL (HYOSCYAMINE SULFATE) 2 tablets by mouth every 4 hours as needed for pain to LEVBID 0.375 MG XR12H-TAB (HYOSCYAMINE SULFATE) take 1 tab by mouth two times a day - Signed Rx of LEVBID 0.375 MG XR12H-TAB (HYOSCYAMINE SULFATE) take 1 tab by mouth two times a day;  #60 x 2;  Signed;  Entered by: Chales Abrahams CMA;  Authorized by: Rachael Fee MD;  Method used: Electronically to [facility] [address], [place], [place], [place]  [address], Ph: [phone], Fax: [phone]    Prescriptions: LEVBID 0.375 MG XR12H-TAB (HYOSCYAMINE SULFATE) take 1 tab by mouth two times a day  #60 x 2   Entered by:   Chales Abrahams CMA   Authorized by:   Rachael Fee MD   Signed by:   Chales Abrahams CMA on 08/27/2008   Method used:   Electronically to        [facility]  [address] (retail)       [address]       [place], [place]  [address]       Ph: [phone]       Fax: [phone]   RxID:   [id number]

## 2010-07-13 NOTE — Consult Note (Signed)
Summary: [facility]  [facility]   Imported By: Esmeralda Links D'jimraou 07/17/2007 15:10:35  _____________________________________________________________________  External Attachment:    Type:   Image     Comment:   External Document

## 2010-07-13 NOTE — Procedures (Signed)
Summary: Colonoscopy   Colonoscopy  Procedure date:  07/30/2008  Findings:      Location:  [facility].    Procedures Next Due Date:    Colonoscopy: 08/2013  COLONOSCOPY PROCEDURE REPORT  PATIENT:  Guy Morris, Guy Morris  MR#:  [id number] BIRTHDATE:   1950/09/18   GENDER:   male  ENDOSCOPIST:   Rachael Fee, MD   PROCEDURE DATE:  07/30/2008 PROCEDURE:  Colonoscopy, diagnostic ASA CLASS:   Class II INDICATIONS: history of pre-cancerous (adenomatous) colon polyps   MEDICATIONS:    Fentanyl 75 mcg IV, versed 7mg  IV  DESCRIPTION OF PROCEDURE:   After the risks benefits and alternatives of the procedure were thoroughly explained, informed consent was obtained.  Digital rectal exam was performed and revealed no rectal masses.   The LB CF-H180AL [id number] endoscope was introduced through the anus and advanced to the cecum, which was identified by both the appendix and ileocecal valve, without limitations.  The quality of the prep was good, using MoviPrep.  The instrument was then slowly withdrawn as the colon was fully examined. <<PROCEDUREIMAGES>>      <<OLD IMAGES>>  FINDINGS:  Moderate diverticulosis was found sigmoid to descending  This was otherwise a normal examination (see image1, image2, and image4).   Retroflexed views in the rectum revealed no abnormalities.    The scope was then withdrawn from the patient and the procedure completed.  COMPLICATIONS:   None  ENDOSCOPIC IMPRESSION:  1) Moderate diverticulosis in the sigmoid to descending  2) Otherwise normal examination  RECOMMENDATIONS:  Given your personal history of pre-cancerous polyps, you will need a repeat colonoscopy in 5 years.  REPEAT EXAM:   In 5 year(s) for Colonoscopy.   _______________________________ Rachael Fee, MD  CC: Oliver Barre, MD       Appended Document: Colonoscopy below is an amended repor that includes documentation of his post procedureal pain  complication  COLONOSCOPY PROCEDURE REPORT  PATIENT:  Guy, Morris  MR#:  [id number] BIRTHDATE:   08-15-1950   GENDER:   male  ENDOSCOPIST:   Rachael Fee, MD   PROCEDURE DATE:  07/30/2008 PROCEDURE:  Colonoscopy, diagnostic ASA CLASS:   Class II INDICATIONS: history of pre-cancerous (adenomatous) colon polyps   MEDICATIONS:    Fentanyl 75 mcg IV, versed 7mg  IV  DESCRIPTION OF PROCEDURE:   After the risks benefits and alternatives of the procedure were thoroughly explained, informed consent was obtained.  Digital rectal exam was performed and revealed no rectal masses.   The LB CF-H180AL [id number] endoscope was introduced through the anus and advanced to the cecum, which was identified by both the appendix and ileocecal valve, without limitations.  The quality of the prep was good, using MoviPrep.  The instrument was then slowly withdrawn as the colon was fully examined. <<PROCEDUREIMAGES>>      <<OLD IMAGES>>  FINDINGS:  Moderate diverticulosis was found sigmoid to descending  This was otherwise a normal examination (see image1, image2, and image4).   Retroflexed views in the rectum revealed no abnormalities.    The scope was then withdrawn from the patient and the procedure completed.  COMPLICATIONS:   Severe post procedural pain, patient was sent to ER by ambulance and the EGD that was previously planned to follow this colonoscopy was cancelled.  ENDOSCOPIC IMPRESSION:  1) Moderate diverticulosis in the sigmoid to descending  2) Otherwise normal examination  RECOMMENDATIONS:  Given your personal history of pre-cancerous polyps, you will need a repeat colonoscopy in 5 years.  REPEAT EXAM:   In 5 year(s) for Colonoscopy.   _______________________________ Rachael Fee, MD  CC: Oliver Barre, MD
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
## 2010-07-13 NOTE — Procedures (Signed)
Summary: Colon   Colonoscopy  Procedure date:  07/29/2007  Findings:      Location:  [facility].    Colonoscopy  Procedure date:  07/29/2007  Findings:      [facility]   [facility]   Patient Name: Guy Morris, Guy Morris MRN:  Procedure Procedures: Colonoscopy CPT: [phone].    with polypectomy. CPT: [id number].  Personnel: Endoscopist: Rachael Fee, MD.  Exam Location: Exam performed in Endoscopy Suite. Outpatient  Patient Consent: Procedure, Alternatives, Risks and Benefits discussed, consent obtained, from patient. Consent was obtained by the RN.  Indications  Surveillance of: Adenomatous Polyp(s).  Comments: multiple polyps removed 2 months ago, one with HGD, one was piecemeal resected. History  Current Medications: Patient is not currently taking Coumadin.  Comments: Patient history reviewed and updated, pre-procedure physical performed prior to initiation of sedation? yes Pre-Exam Physical: Performed Jul 29, 2007. Cardio-pulmonary exam, Abdominal exam, Mental status exam WNL.  Exam Exam: Extent of exam reached: Cecum, extent intended: Cecum.  The cecum was identified by appendiceal orifice and IC valve. Patient position: on left side. Time to Cecum: 00:03:07. Time for Withdrawl: 00:16:45. Colon retroflexion performed. Images taken. ASA Classification: II. Tolerance: good.  Monitoring: Pulse and BP monitoring, Oximetry used. Supplemental O2 given.  Colon Prep Prep results: good.  Sedation Meds: Patient assessed and found to be appropriate for moderate (conscious) sedation. Fentanyl 75 mcg. given IV. Versed 6 mg. given IV.  Findings - MULTIPLE POLYPS: Ascending Colon to Transverse Colon. 5 polyps Polyps sent to pathology. Path # 1 and 2. Comments: 6 polyps.  All were sessile and none were larger than 5-59mm.  They were all completely removed using snare and no cautery.  5 of the polyps were in right colon, the last was in mid  transverse.  - DIVERTICULOSIS: Descending Colon to Sigmoid Colon.  - NORMAL EXAM: Cecum to Rectum. Comments: otherwise normal examination.   Assessment Abnormal examination, see findings above.  Comments: SIX SMALL COLON POLYPS, ALL COMPLETELY REMOVED WITH SNARE (NO CAUTERY) .  LEFT SIDED DIVERTICULOSIS.  NO CANCERS. Events  Unplanned Interventions: No intervention was required.  Unplanned Events: There were no complications. Plans Comments: AWAIT FINAL PATHOLOGY, BUT GIVEN LARGE POLYP BURDED (ON THIS EXAM AND THE COLONOSCOPY 2-3 MONTHS AGO) AND THE FACT THAT HGD WAS SEEN FOCALLY IN ONE OF THE COMPLETELY RESECTED POLYPS IN 12/09, HE WILL LIKELY NEED REPEAT COLONOSCOPY IN 1 YEAR. This report was created from the original endoscopy report, which was reviewed and signed by the above listed endoscopist.

## 2010-07-13 NOTE — Procedures (Signed)
Summary: Instructions for Procedure/[facility]  Instructions for Procedure/[facility]   Imported By: Lanelle Bal 08/11/2008 10:52:15  _____________________________________________________________________  External Attachment:    Type:   Image     Comment:   External Document

## 2010-07-13 NOTE — Miscellaneous (Signed)
Summary: Waiver of Liability/[facility]  Waiver of Liability/[facility]   Imported By: Lanelle Bal 08/11/2008 10:53:36  _____________________________________________________________________  External Attachment:    Type:   Image     Comment:   External Document

## 2010-07-13 NOTE — Procedures (Signed)
Summary: Colon   Colonoscopy  Procedure date:  05/27/2007  Findings:      Location:  [facility].    Patient Name: Guy Morris, Guy Morris MRN:  Procedure Procedures: Colonoscopy CPT: [phone].    with polypectomy. CPT: [id number].  Personnel: Endoscopist: Rachael Fee, MD.  Referred By: Corwin Levins, MD.  Exam Location: Exam performed in Endoscopy Suite. Outpatient  Patient Consent: Procedure, Alternatives, Risks and Benefits discussed, consent obtained, from patient. Consent was obtained by the RN.  Indications  Average Risk Screening Routine.  Comments: had flex sig in 1995, no polyps; History  Current Medications: Patient is not currently taking Coumadin.  Comments: Patient history reviewed and updated, pre-procedure physical performed prior to initiation of sedation? yes Pre-Exam Physical: Performed May 27, 2007. Cardio-pulmonary exam, Abdominal exam, Mental status exam WNL.  Exam Exam: Extent of exam reached: Cecum, extent intended: Cecum.  The cecum was identified by appendiceal orifice and IC valve. Patient position: on left side. Time to Cecum: 00:02:05. Time for Withdrawl: 00:29:04. Colon retroflexion performed. Images taken. ASA Classification: II. Tolerance: good.  Monitoring: Pulse and BP monitoring, Oximetry used. Supplemental O2 given.  Colon Prep Prep results: fair, exam compromised.  Sedation Meds: Patient assessed and found to be appropriate for moderate (conscious) sedation. Fentanyl 75 mcg. given IV. Versed 10 mg. given IV.  Findings POLYP: Transverse Colon, Maximum size: 3 mm. sessile polyp. Procedure:  snare without cautery, removed, retrieved, Polyp sent to pathology. Polyp sent to pathology.  POLYP: Transverse Colon, Maximum size: 9 mm. sessile polyp. Procedure:  snare without cautery, The polyp was removed piece meal. removed, retrieved, sent to pathology. sent to pathology.  - MULTIPLE POLYPS: Ascending Colon. removed,  retrieved, 5 polyps Polyps sent to pathology. Path # 1 and 2. Comments: 5 ascending colon polyps.  All were sessile.  Most were 5-67mm and were removed with cold snare.  One was 15mm and was removed with snare, cautery and then removed with [employer].  The largest polyp was sent in pathology jar #2.  The remaining smaller polyps were sent in jar #1.  - DIVERTICULOSIS: Sigmoid Colon. Comments: a few small diverticulum.  - NORMAL EXAM: Cecum to Rectum. Comments: otherwise normal examination.   Assessment Abnormal examination, see findings above.  Comments: SEVERAL COLON POLYPS, ONE WAS >1CM AND ONE WAS AND REMOVED IN PIECEMEAL FASHION.  NO CANCERS. Events  Unplanned Interventions: No intervention was required.  Unplanned Events: There were no complications. Plans Comments: AWAIT PATHOLOGY. LIKELY WILL REPEAT COLONOSCOPY IN 1 YEAR GIVEN LARGE NUMBER OF POLYPS AND PIECEMEAL RESECTION. Scheduling/Referral: Await pathology to schedule patient.

## 2010-09-26 LAB — BASIC METABOLIC PANEL
BUN: 5 mg/dL — ABNORMAL LOW (ref 6–23)
BUN: 5 mg/dL — ABNORMAL LOW (ref 6–23)
CO2: 22 mEq/L (ref 19–32)
CO2: 23 mEq/L (ref 19–32)
Calcium: 8.7 mg/dL (ref 8.4–10.5)
Calcium: 8.8 mg/dL (ref 8.4–10.5)
Chloride: 104 mEq/L (ref 96–112)
Chloride: 106 mEq/L (ref 96–112)
Creatinine, Ser: 0.69 mg/dL (ref 0.4–1.5)
Creatinine, Ser: 0.81 mg/dL (ref 0.4–1.5)
GFR calc Af Amer: >60 mL/min (ref >60)
GFR calc Af Amer: >60 mL/min (ref >60)
GFR calc non Af Amer: >60 mL/min (ref >60)
GFR calc non Af Amer: >60 mL/min (ref >60)
Glucose, Bld: 86 mg/dL (ref 70–99)
Glucose, Bld: 96 mg/dL (ref 70–99)
Potassium: 3.8 mEq/L (ref 3.5–5.1)
Potassium: 4 mEq/L (ref 3.5–5.1)
Sodium: 133 mEq/L — ABNORMAL LOW (ref 135–145)
Sodium: 136 mEq/L (ref 135–145)

## 2010-09-26 LAB — CBC
HCT: 40.6 % (ref 39.0–52.0)
HCT: 42.5 % (ref 39.0–52.0)
Hemoglobin: 13.8 g/dL (ref 13.0–17.0)
Hemoglobin: 14.4 g/dL (ref 13.0–17.0)
MCHC: 33.8 g/dL (ref 30.0–36.0)
MCHC: 33.9 g/dL (ref 30.0–36.0)
MCV: 91.6 fL (ref 78.0–100.0)
MCV: 91.7 fL (ref 78.0–100.0)
Platelets: 256 10*3/uL (ref 150–400)
Platelets: 263 10*3/uL (ref 150–400)
RBC: 4.43 MIL/uL (ref 4.22–5.81)
RBC: 4.63 MIL/uL (ref 4.22–5.81)
RDW: 12.9 % (ref 11.5–15.5)
RDW: 13.1 % (ref 11.5–15.5)
WBC: 10.8 10*3/uL — ABNORMAL HIGH (ref 4.0–10.5)
WBC: 8.5 10*3/uL (ref 4.0–10.5)

## 2010-09-26 LAB — TYPE AND SCREEN
ABO/RH(D): A POS
Antibody Screen: NEGATIVE

## 2010-09-26 LAB — TROPONIN I
Troponin I: 0.03 ng/mL (ref 0.00–0.06)
Troponin I: 0.04 ng/mL (ref 0.00–0.06)

## 2010-09-26 LAB — DIFFERENTIAL
Basophils Absolute: 0.1 10*3/uL (ref 0.0–0.1)
Basophils Relative: 1 % (ref 0–1)
Eosinophils Absolute: 0.1 10*3/uL (ref 0.0–0.7)
Eosinophils Relative: 1 % (ref 0–5)
Lymphocytes Relative: 29 % (ref 12–46)
Lymphs Abs: 2.5 10*3/uL (ref 0.7–4.0)
Monocytes Absolute: 0.6 10*3/uL (ref 0.1–1.0)
Monocytes Relative: 7 % (ref 3–12)
Neutro Abs: 5.3 10*3/uL (ref 1.7–7.7)
Neutrophils Relative %: 63 % (ref 43–77)

## 2010-09-26 LAB — CK
Total CK: 71 U/L (ref 7–232)
Total CK: 83 U/L (ref 7–232)

## 2010-09-26 LAB — ABO/RH: ABO/RH(D): A POS

## 2010-10-15 ENCOUNTER — Encounter: Payer: Self-pay | Admitting: Internal Medicine

## 2010-10-15 DIAGNOSIS — Z0000 Encounter for general adult medical examination without abnormal findings: Secondary | ICD-10-CM | POA: Insufficient documentation

## 2010-10-17 ENCOUNTER — Ambulatory Visit (INDEPENDENT_AMBULATORY_CARE_PROVIDER_SITE_OTHER): Payer: Medicare Other | Admitting: Internal Medicine

## 2010-10-17 ENCOUNTER — Other Ambulatory Visit (INDEPENDENT_AMBULATORY_CARE_PROVIDER_SITE_OTHER): Payer: Medicare Other

## 2010-10-17 ENCOUNTER — Encounter: Payer: Self-pay | Admitting: Internal Medicine

## 2010-10-17 ENCOUNTER — Other Ambulatory Visit (INDEPENDENT_AMBULATORY_CARE_PROVIDER_SITE_OTHER): Payer: Medicare Other | Admitting: Internal Medicine

## 2010-10-17 VITALS — BP 122/72 | HR 47 | Temp 97.7°F | Ht 68.0 in | Wt 171.1 lb

## 2010-10-17 DIAGNOSIS — E785 Hyperlipidemia, unspecified: Secondary | ICD-10-CM

## 2010-10-17 DIAGNOSIS — Z13220 Encounter for screening for lipoid disorders: Secondary | ICD-10-CM

## 2010-10-17 DIAGNOSIS — G2581 Restless legs syndrome: Secondary | ICD-10-CM

## 2010-10-17 DIAGNOSIS — R5381 Other malaise: Secondary | ICD-10-CM

## 2010-10-17 DIAGNOSIS — E039 Hypothyroidism, unspecified: Secondary | ICD-10-CM

## 2010-10-17 DIAGNOSIS — Z0000 Encounter for general adult medical examination without abnormal findings: Secondary | ICD-10-CM

## 2010-10-17 DIAGNOSIS — R5383 Other fatigue: Secondary | ICD-10-CM

## 2010-10-17 DIAGNOSIS — R3915 Urgency of urination: Secondary | ICD-10-CM | POA: Insufficient documentation

## 2010-10-17 DIAGNOSIS — Z79899 Other long term (current) drug therapy: Secondary | ICD-10-CM

## 2010-10-17 HISTORY — DX: Restless legs syndrome: G25.81

## 2010-10-17 LAB — URINALYSIS, ROUTINE W REFLEX MICROSCOPIC
Bilirubin Urine: NEGATIVE
Ketones, ur: NEGATIVE
Leukocytes, UA: NEGATIVE
Nitrite: NEGATIVE
Specific Gravity, Urine: 1.01 (ref 1.000–1.030)
Total Protein, Urine: NEGATIVE
Urine Glucose: NEGATIVE
Urobilinogen, UA: 0.2 (ref 0.0–1.0)
pH: 5.5 (ref 5.0–8.0)

## 2010-10-17 LAB — LIPID PANEL
Cholesterol: 252 mg/dL — ABNORMAL HIGH (ref 0–200)
HDL: 43.7 mg/dL (ref >39.00)
Total CHOL/HDL Ratio: 6
Triglycerides: 102 mg/dL (ref 0.0–149.0)
VLDL: 20.4 mg/dL (ref 0.0–40.0)

## 2010-10-17 LAB — BASIC METABOLIC PANEL
BUN: 12 mg/dL (ref 6–23)
CO2: 29 mEq/L (ref 19–32)
Calcium: 9.8 mg/dL (ref 8.4–10.5)
Chloride: 103 mEq/L (ref 96–112)
Creatinine, Ser: 0.8 mg/dL (ref 0.4–1.5)
GFR: 106.68 mL/min (ref >60.00)
Glucose, Bld: 81 mg/dL (ref 70–99)
Potassium: 4.6 mEq/L (ref 3.5–5.1)
Sodium: 139 mEq/L (ref 135–145)

## 2010-10-17 LAB — HEPATIC FUNCTION PANEL
ALT: 14 U/L (ref 0–53)
AST: 17 U/L (ref 0–37)
Albumin: 4 g/dL (ref 3.5–5.2)
Alkaline Phosphatase: 84 U/L (ref 39–117)
Bilirubin, Direct: 0.1 mg/dL (ref 0.0–0.3)
Total Bilirubin: 0.6 mg/dL (ref 0.3–1.2)
Total Protein: 6.9 g/dL (ref 6.0–8.3)

## 2010-10-17 LAB — CBC WITH DIFFERENTIAL/PLATELET
Basophils Absolute: 0 10*3/uL (ref 0.0–0.1)
Basophils Relative: 0.3 % (ref 0.0–3.0)
Eosinophils Absolute: 0.1 10*3/uL (ref 0.0–0.7)
Eosinophils Relative: 0.9 % (ref 0.0–5.0)
HCT: 47.8 % (ref 39.0–52.0)
Hemoglobin: 16.5 g/dL (ref 13.0–17.0)
Lymphocytes Relative: 23.6 % (ref 12.0–46.0)
Lymphs Abs: 2.7 10*3/uL (ref 0.7–4.0)
MCHC: 34.5 g/dL (ref 30.0–36.0)
MCV: 93.6 fl (ref 78.0–100.0)
Monocytes Absolute: 0.7 10*3/uL (ref 0.1–1.0)
Monocytes Relative: 5.7 % (ref 3.0–12.0)
Neutro Abs: 8 10*3/uL — ABNORMAL HIGH (ref 1.4–7.7)
Neutrophils Relative %: 69.5 % (ref 43.0–77.0)
Platelets: 249 10*3/uL (ref 150.0–400.0)
RBC: 5.11 Mil/uL (ref 4.22–5.81)
RDW: 13.5 % (ref 11.5–14.6)
WBC: 11.5 10*3/uL — ABNORMAL HIGH (ref 4.5–10.5)

## 2010-10-17 LAB — TSH: TSH: 3.71 u[IU]/mL (ref 0.35–5.50)

## 2010-10-17 LAB — LDL CHOLESTEROL, DIRECT: Direct LDL: 191.1 mg/dL

## 2010-10-17 MED ORDER — CLONAZEPAM 1 MG PO TABS: 1.0000 mg | ORAL_TABLET | Freq: Every evening | ORAL | Status: DC | PRN

## 2010-10-17 NOTE — Assessment & Plan Note (Signed)
Also for lft's on new statin
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
## 2010-10-17 NOTE — Assessment & Plan Note (Signed)
stable overall by hx and exam, most recent lab reviewed with pt, and pt to continue medical treatment as before, but with fatigue will check TSH today  Lab Results  Component Value Date   TSH 5.11 04/18/2010

## 2010-10-17 NOTE — Assessment & Plan Note (Signed)
stable overall by hx and exam, most recent lab reviewed with pt, and pt to continue medical treatment as before  Lab Results  Component Value Date   LDLCALC 112* 05/29/2007    To check ldl and lft's on new statin, cont diet

## 2010-10-17 NOTE — Assessment & Plan Note (Signed)
Does have sense of ongoing fatigue, but denies signficant hypersomnolence.  Etiology unclear, Exam otherwise benign, to check labs as documented, follow with expectant management

## 2010-10-17 NOTE — Assessment & Plan Note (Signed)
New onset, for klonpin qhs,  to f/u any worsening symptoms or concerns

## 2010-10-17 NOTE — Patient Instructions (Signed)
Please go to LAB in the Basement for the blood and/or urine tests to be done today Please call the number on the [employer] (the [employer] [facility]) for results of testing in 2-3 days Take all new medications as prescribed - the medication at night for the restless legs Continue all other medications as before You will be contacted regarding the referral for: urology Please return in 6 mo with Lab testing done 3-5 days before

## 2010-10-17 NOTE — Assessment & Plan Note (Signed)
Likely a bladder irrtative symptoms related to prostate -   For UA today to r/o infection, and refer urology

## 2010-10-18 ENCOUNTER — Telehealth: Payer: Self-pay | Admitting: Internal Medicine

## 2010-10-18 MED ORDER — EZETIMIBE 10 MG PO TABS: 10.0000 mg | ORAL_TABLET | Freq: Every day | ORAL | Status: DC

## 2010-10-18 NOTE — Telephone Encounter (Signed)
zetia rx sent to pharmacy  Continue all other medications as before

## 2010-10-22 ENCOUNTER — Encounter: Payer: Self-pay | Admitting: Internal Medicine

## 2010-10-24 NOTE — H&P (Signed)
[name], [name]                ACCOUNT NO.:  [id number]   MEDICAL RECORD NO.:  [id number]          PATIENT TYPE:  EMS   LOCATION:  ED                           FACILITY:  [facility]   PHYSICIAN:  Wilhemina Bonito. Marina Goodell, MD      DATE OF BIRTH:  Apr 10, 1951   DATE OF ADMISSION:  07/30/2008  DATE OF DISCHARGE:                              HISTORY & PHYSICAL   PROBLEM:  Severe abdominal pain post colonoscopy.   HISTORY:  Guy Morris is a 60 year old white male referred to Dr. Christella Hartigan per  Dr. Oliver Barre with recent complaints of a choking sensation in his  throat which is associated with bilateral shoulder pain and chest  discomfort.  This had been occurring episodically over the past 3-4  weeks, apparently episodes lasting 30 minutes or so.  He had seen Dr.  Jonny Ruiz, had subsequently been referred for nuclear medicine stress testing  and a Cardiolite was negative with no evidence of ischemia.  He was then  sent to Dr. Christella Hartigan with question of esophageal spasm.  The patient does  have a history of GERD.  He also has history of colon polyps with  several tubular adenomas removed at colonoscopy approximately 2 years  ago.  He was scheduled for followup colonoscopy and EGD with Dr. Christella Hartigan  today for further evaluation.  He had the colonoscopy first, which did  show moderate diverticular disease in the sigmoid and descending colon.  No polyps were noted.  Immediately post procedure the patient was  complaining of severe excruciating diffuse abdominal pain, which he  describes as a tearing sort of pain.  He remained hemodynamically  stable.  EMS was called and he was transported to the emergency room at  [facility].  He has remained hemodynamically stable, has not had any  emesis, has no complaints of shortness of breath, chest pain or  diaphoresis.  He is being admitted at this time for stabilization and  further workup to rule out colonic perforation.   CURRENT MEDICATIONS:  1. Prilosec 20 mg daily.  2.  Levothyroxine 100 mcg daily.   ALLERGIES:  BENADRYL, VIOXX AND NEURONTIN.   PAST HISTORY:  Is pertinent for:  1. GERD.  2. Hypothyroidism.  3. Hyperlipidemia.  4. He has chronic back pain and is disabled with disk disease in the      lumbar spine.  5. Has history of adenomatous colon polyps as outlined above.   SOCIAL HISTORY:  The patient is married.  He has three children.  He is  disabled.  No tobacco, no ETOH.   FAMILY HISTORY:  Father deceased secondary to lung cancer.  Mother  deceased with breast cancer.   REVIEW OF SYSTEMS:  GENERAL:  The patient complains of diffuse abdominal  pain and weakness, currently.  CARDIOVASCULAR:  Denies any chest pain or  anginal symptoms.  Episodic throat and shoulder complex as outlined  above.  RESPIRATORY:  Denies any cough, shortness of breath or sputum  production.  GI:  As outlined above.  GENITOURINARY:  Denies any  dysuria, urgency or frequency.  MUSCULOSKELETAL:  Positive  for chronic  low back pain.  He does generally not take any pain medication.  NEURO/PSYCH:  Negative.  All other review of systems negative.   LABORATORIES:  WBC of 8.5, hemoglobin 14.4, hematocrit of 42.5, MCV of  91.7, potassium 3.8, creatinine 0.69.  Plain abdominal films supine  views only show a markedly distended air filled colon.  There is no free  air noted.  CT is pending.   PHYSICAL EXAM:  Well-developed white male in pain.  He is grimacing,  holding his abdomen.  He is alert and oriented.  He is afebrile.  Blood  pressure 143/87, pulse in the 90s, O2 saturation 95 on room air.  HEENT:  Nontraumatic, normocephalic.  EOMI, PERRLA.  Sclerae anicteric.  Color is good.  NECK:  Supple.  There is no JVD.  CARDIOVASCULAR:  Regular rate and rhythm with S1, S2.  There is no  murmur, rub or gallop.  PULMONARY:  Clear to A and P.  ABDOMEN:  Is distended, fairly firm.  Bowel sounds are quiet.  He is  diffusely tender with guarding and rebound.  No incisional  scars.  RECTAL EXAM:  Is not done at this time.  See colonoscopy report from  earlier this afternoon.  EXTREMITIES:  No clubbing, cyanosis or edema.  NEURO:  The patient is alert and oriented x3.  Exam is grossly nonfocal.   IMPRESSION:  1. [age]-year-old white male with acute severe diffuse abdominal      pain post colonoscopy this afternoon.  Rule out bowel perforation.      Rule out gas trapping.  Rule out other acute intra-abdominal event.  2. Recent complaint of throat and shoulder pain.  Question esophageal      spasm.  3. Hypothyroidism.  4. Chronic back pain with disk disease.  5. Gastroesophageal reflux disease.   PLAN:  The patient is admitted to the service of Dr. Yancey Flemings.  He  will be kept n.p.o.  Pain control and antibiotics as needed.  Stat CT  scan of the abdomen and pelvis and then further plans pending findings  on CT.  For details please see the orders.      Amy Esterwood, PA-C      John N. Marina Goodell, MD  Electronically Signed    AE/MEDQ  D:  07/30/2008  T:  07/30/2008  Job:  [id number]   cc:   Rachael Fee, MD  [address]  [place], [place] [address]   Corwin Levins, MD  [address]  [place]  [place] [address]

## 2010-10-27 NOTE — Discharge Summary (Signed)
Guy Morris, Guy Morris                          ACCOUNT NO.:  [id number]   MEDICAL RECORD NO.:  [id number]                   PATIENT TYPE:  INP   LOCATION:  A311                                 FACILITY:  [facility]   PHYSICIAN:  Vania Rea, M.D.              DATE OF BIRTH:  07-Jul-1950   DATE OF ADMISSION:  10/02/2003  DATE OF DISCHARGE:  10/03/2003                                 DISCHARGE SUMMARY   DISPOSITION:  Discharged to home.   DISCHARGE CONDITION:  Stable.   DISCHARGE DIAGNOSES:  1. Gastroenteritis, resolved.  2. Hypertension, controlled.  3. Hypothyroidism, controlled.  4. History of gastroesophageal reflux disease, stable.   DISCHARGE MEDICATIONS:  1. Lotensin 5 mg daily.  2. Synthroid 25 mg daily.  3. Prevacid 30 mg daily.   HOSPITAL COURSE:  Please refer to history and physical of April 24. This is  a 60 year old Caucasian man who was admitted after 36 hours of postprandial  nausea and vomiting of undigested food. He denied any hematemesis, melena,  or hematochezia. In the emergency room, he had an abdominal series which  suggested possible small bowel obstruction. Subsequent CT scan of the  abdomen revealed no evidence of obstruction. The patient was admitted,  received IV hydration and a graduated diet. Today, the patient is able to  tolerate a full diet without any nausea, vomiting, or pain.   The patient does describe abdominal discomfort in the months prior to  admission whenever he eats very greasy food. The patient also says that he  feels as if he has been loosing weight for the past few months; however,  there has been no change in the way his clothes have been fitting him.   PHYSICAL EXAMINATION:  VITAL SIGNS:  This morning, his vitals:  Temperature  97.5, pulse 56, respirations 20, blood pressure 104/55.  HEENT:  His mucous membranes are pink. He is anicteric. He is not  dehydrated. His chest is clear to auscultation bilaterally.  CARDIOVASCULAR:   His heart has a regular rhythm. No murmurs.  ABDOMEN:  Obese and soft. There is mild right upper quadrant tenderness.  There is no organomegaly.  EXTREMITIES:  There is no edema. He has 3+ pulses bilaterally.  CENTRAL NERVOUS SYSTEM:  Cranial nerves are grossly intact. There is no  focal deficit.   LABORATORY DATA:  During this brief hospital stay revealed a repeatedly  normal lipase. Slightly low sodium of 131 yesterday probably related to his  vomiting not yet repleted. Serum chemistry has been otherwise normal. His  liver function tests, his alkaline phosphatase was 104, SGOT 25, SGPT 90,  his total protein was 5.4, and albumin 3.2, and a calcium 8.6.   FOLLOW UP:  The patient is to have an abdominal ultrasound as an outpatient  to evaluate for gallstones. He is also to follow up with Dr. Rehman/Dr.  Jena Gauss to assess for any other cause of his  persistent intolerance to fats.   SPECIAL INSTRUCTIONS:  The patient is advised to avoid fatty or greasy foods  and to keep all appointments with his physicians.     ___________________________________________                                         Vania Rea, M.D.   LC/MEDQ  D:  10/05/2003  T:  10/05/2003  Job:  [id number]

## 2010-10-27 NOTE — H&P (Signed)
NAME:  ERICA, OSUNA                          ACCOUNT NO.:  [id number]   MEDICAL RECORD NO.:  [id number]                   PATIENT TYPE:  INP   LOCATION:  A311                                 FACILITY:  [facility]   PHYSICIAN:  Melvyn Novas, MD        DATE OF BIRTH:  01-24-1951   DATE OF ADMISSION:  10/02/2003  DATE OF DISCHARGE:                                HISTORY & PHYSICAL   REASON FOR ADMISSION:  The patient is a 60 year old white male who complains  of a 36-hour history of recurrent postprandial nausea and vomiting of food.  He specifically denies any hematemesis, melena, hematochezia.  He did have  some epigastric tenderness which has precipitated postprandially which tends  to subside after vomiting.  He denies any abdominal trauma and he is  admitted through the ER.  He had some diffuse abdominal tenderness.  X-rays  reveal possible small-bowel obstruction.  CAT scan with contrast reveals no  evidence of small-bowel obstruction.  The appendix looked fairly normal.  There is some scattered diverticula.  He is admitted for serial blood work  and repeat abdominal exam to pursue his clinical course.   PAST SURGICAL HISTORY:  Remarkable for a left knee arthroscopic surgery.   PAST MEDICAL HISTORY:  Significant for hypothyroidism, hypertension, and  GERD.   CURRENT MEDICATIONS:  1. A blood pressure pill he takes once a day.  2. Synthroid unknown dosage.  3. Prevacid once a day.   SOCIAL HISTORY:  He is a retired Personnel officer, married, nonsmoker, does not  imbibe alcohol.   PHYSICAL EXAMINATION:  VITAL SIGNS:  Blood pressure 109/72, temperature is  98.3, respiratory rate is 60 and pulse is 76.  HEENT:  Head:  Normocephalic, atraumatic.  Eyes:  PERRLA.  Extraocular  movements intact.  Sclerae clear.  Conjunctivae pink.  Throat shows no  erythema or exudate.  NECK:  Shows no jugular venous distention, no carotid bruits, no  thyromegaly, no thyroid bruits.  LUNGS:   Clear to A&P.  No rales, wheezes, or rhonchi.  HEART:  Regular rhythm.  No murmurs, gallops, heaves, thrills, or rubs.  ABDOMEN:  Soft, positive diffuse tenderness all over.  Bowel sounds are  hypoactive.  No borborygmi.  No rebound.  No masses detectable.  RECTAL:  Was heme-negative in the ER.  EXTREMITIES:  No clubbing, cyanosis, or edema.  NEUROLOGICAL:  Cranial nerves grossly intact.  The patient moves all four  extremities.   IMPRESSION:  1. Recurrent nausea and vomiting.  2. Diffuse abdominal pain.  3. Possible ileus.  4. Hypertension.  5. Gastroesophageal reflux disease.  6. Hypothyroidism.   PLAN:  The plan is to admit and repeat serial abdominal exams, repeat  lipase, amylase, CBC, and BMP.  Follow clinical course with abdominal checks  and I will make further recommendations as the database expands.  He is on a  clear liquid diet.     ___________________________________________  Melvyn Novas, MD   RMD/MEDQ  D:  10/03/2003  T:  10/03/2003  Job:  [id number]

## 2011-04-11 ENCOUNTER — Other Ambulatory Visit: Payer: Self-pay | Admitting: Internal Medicine

## 2011-04-23 ENCOUNTER — Ambulatory Visit (INDEPENDENT_AMBULATORY_CARE_PROVIDER_SITE_OTHER): Payer: Medicare Other | Admitting: Internal Medicine

## 2011-04-23 ENCOUNTER — Encounter: Payer: Self-pay | Admitting: Internal Medicine

## 2011-04-23 VITALS — BP 106/70 | HR 46 | Temp 98.0°F | Ht 68.0 in | Wt 172.3 lb

## 2011-04-23 DIAGNOSIS — E039 Hypothyroidism, unspecified: Secondary | ICD-10-CM

## 2011-04-23 DIAGNOSIS — Z23 Encounter for immunization: Secondary | ICD-10-CM

## 2011-04-23 DIAGNOSIS — R634 Abnormal weight loss: Secondary | ICD-10-CM

## 2011-04-23 DIAGNOSIS — M1990 Unspecified osteoarthritis, unspecified site: Secondary | ICD-10-CM

## 2011-04-23 DIAGNOSIS — E785 Hyperlipidemia, unspecified: Secondary | ICD-10-CM

## 2011-04-23 HISTORY — DX: Unspecified osteoarthritis, unspecified site: M19.90

## 2011-04-23 MED ORDER — SIMVASTATIN 20 MG PO TABS: 20.0000 mg | ORAL_TABLET | Freq: Every day | ORAL | Status: DC

## 2011-04-23 MED ORDER — MELOXICAM 15 MG PO TABS: 15.0000 mg | ORAL_TABLET | Freq: Every day | ORAL | Status: DC

## 2011-04-23 NOTE — Assessment & Plan Note (Signed)
Intentional, with better diet and exercise,  to f/u any worsening symptoms or concerns

## 2011-04-23 NOTE — Assessment & Plan Note (Signed)
stable overall by hx and exam, most recent data reviewed with pt, and pt to continue medical treatment as before  Lab Results  Component Value Date   TSH 3.71 10/17/2010

## 2011-04-23 NOTE — Patient Instructions (Signed)
Take all new medications as prescribed - the generic for zocor (simvastatin) lower dose, and the anti-inflammatory for pain Continue all other medications as before You had the flu shot today Please return in 6 months, or sooner if needed

## 2011-04-23 NOTE — Progress Notes (Signed)
Addended by: Scharlene Gloss B on: 04/23/2011 08:48 AM   Modules accepted: Orders

## 2011-04-23 NOTE — Assessment & Plan Note (Signed)
Is taking the zetia, but not the zocor;  Doubt the zetia alone with get LDL < 100;  Has been intol of lipitor/crestor in the past;  For low dose zetai and hopefully he can tolerate thi;  to f/u any worsening symptoms or concerns and labs next visit

## 2011-04-23 NOTE — Assessment & Plan Note (Signed)
Ok for meloxicam prn,  to f/u any worsening symptoms or concerns
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
## 2011-08-09 DIAGNOSIS — M25569 Pain in unspecified knee: Secondary | ICD-10-CM | POA: Diagnosis not present

## 2011-08-20 DIAGNOSIS — M25569 Pain in unspecified knee: Secondary | ICD-10-CM | POA: Diagnosis not present

## 2011-10-22 ENCOUNTER — Encounter: Payer: Self-pay | Admitting: Internal Medicine

## 2011-10-22 ENCOUNTER — Ambulatory Visit (INDEPENDENT_AMBULATORY_CARE_PROVIDER_SITE_OTHER): Payer: Medicare Other | Admitting: Internal Medicine

## 2011-10-22 ENCOUNTER — Other Ambulatory Visit (INDEPENDENT_AMBULATORY_CARE_PROVIDER_SITE_OTHER): Payer: Medicare Other

## 2011-10-22 VITALS — BP 132/78 | HR 46 | Temp 97.0°F | Ht 68.0 in | Wt 179.1 lb

## 2011-10-22 DIAGNOSIS — N320 Bladder-neck obstruction: Secondary | ICD-10-CM | POA: Insufficient documentation

## 2011-10-22 DIAGNOSIS — H5460 Unqualified visual loss, one eye, unspecified: Secondary | ICD-10-CM

## 2011-10-22 DIAGNOSIS — M1990 Unspecified osteoarthritis, unspecified site: Secondary | ICD-10-CM

## 2011-10-22 DIAGNOSIS — E039 Hypothyroidism, unspecified: Secondary | ICD-10-CM | POA: Diagnosis not present

## 2011-10-22 DIAGNOSIS — R109 Unspecified abdominal pain: Secondary | ICD-10-CM

## 2011-10-22 DIAGNOSIS — H5461 Unqualified visual loss, right eye, normal vision left eye: Secondary | ICD-10-CM | POA: Insufficient documentation

## 2011-10-22 DIAGNOSIS — E785 Hyperlipidemia, unspecified: Secondary | ICD-10-CM | POA: Diagnosis not present

## 2011-10-22 DIAGNOSIS — R350 Frequency of micturition: Secondary | ICD-10-CM

## 2011-10-22 LAB — LIPID PANEL
Cholesterol: 212 mg/dL — ABNORMAL HIGH (ref 0–200)
HDL: 37.1 mg/dL — ABNORMAL LOW (ref >39.00)
Total CHOL/HDL Ratio: 6
Triglycerides: 190 mg/dL — ABNORMAL HIGH (ref 0.0–149.0)
VLDL: 38 mg/dL (ref 0.0–40.0)

## 2011-10-22 LAB — URINALYSIS, ROUTINE W REFLEX MICROSCOPIC
Bilirubin Urine: NEGATIVE
Ketones, ur: NEGATIVE
Leukocytes, UA: NEGATIVE
Nitrite: NEGATIVE
Specific Gravity, Urine: 1.02 (ref 1.000–1.030)
Total Protein, Urine: NEGATIVE
Urine Glucose: NEGATIVE
Urobilinogen, UA: 0.2 (ref 0.0–1.0)
pH: 5.5 (ref 5.0–8.0)

## 2011-10-22 LAB — CBC WITH DIFFERENTIAL/PLATELET
Basophils Absolute: 0.1 10*3/uL (ref 0.0–0.1)
Basophils Relative: 0.5 % (ref 0.0–3.0)
Eosinophils Absolute: 0.2 10*3/uL (ref 0.0–0.7)
Eosinophils Relative: 1.4 % (ref 0.0–5.0)
HCT: 47 % (ref 39.0–52.0)
Hemoglobin: 15.7 g/dL (ref 13.0–17.0)
Lymphocytes Relative: 20.8 % (ref 12.0–46.0)
Lymphs Abs: 2.7 10*3/uL (ref 0.7–4.0)
MCHC: 33.4 g/dL (ref 30.0–36.0)
MCV: 95.1 fl (ref 78.0–100.0)
Monocytes Absolute: 0.8 10*3/uL (ref 0.1–1.0)
Monocytes Relative: 6 % (ref 3.0–12.0)
Neutro Abs: 9.2 10*3/uL — ABNORMAL HIGH (ref 1.4–7.7)
Neutrophils Relative %: 71.3 % (ref 43.0–77.0)
Platelets: 243 10*3/uL (ref 150.0–400.0)
RBC: 4.95 Mil/uL (ref 4.22–5.81)
RDW: 13.4 % (ref 11.5–14.6)
WBC: 12.9 10*3/uL — ABNORMAL HIGH (ref 4.5–10.5)

## 2011-10-22 LAB — BASIC METABOLIC PANEL
BUN: 12 mg/dL (ref 6–23)
CO2: 24 mEq/L (ref 19–32)
Calcium: 9 mg/dL (ref 8.4–10.5)
Chloride: 101 mEq/L (ref 96–112)
Creatinine, Ser: 0.7 mg/dL (ref 0.4–1.5)
GFR: 130.83 mL/min (ref >60.00)
Glucose, Bld: 86 mg/dL (ref 70–99)
Potassium: 4.3 mEq/L (ref 3.5–5.1)
Sodium: 137 mEq/L (ref 135–145)

## 2011-10-22 LAB — TSH: TSH: 4.18 u[IU]/mL (ref 0.35–5.50)

## 2011-10-22 LAB — LDL CHOLESTEROL, DIRECT: Direct LDL: 141.6 mg/dL

## 2011-10-22 LAB — HEPATIC FUNCTION PANEL
ALT: 14 U/L (ref 0–53)
AST: 18 U/L (ref 0–37)
Albumin: 3.8 g/dL (ref 3.5–5.2)
Alkaline Phosphatase: 76 U/L (ref 39–117)
Bilirubin, Direct: 0.1 mg/dL (ref 0.0–0.3)
Total Bilirubin: 0.4 mg/dL (ref 0.3–1.2)
Total Protein: 6.8 g/dL (ref 6.0–8.3)

## 2011-10-22 LAB — PSA: PSA: 1.26 ng/mL (ref 0.10–4.00)

## 2011-10-22 LAB — SEDIMENTATION RATE: Sed Rate: 7 mm/hr (ref 0–22)

## 2011-10-22 MED ORDER — NAPROXEN 500 MG PO TABS: 500.0000 mg | ORAL_TABLET | Freq: Two times a day (BID) | ORAL | Status: DC
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
## 2011-10-22 NOTE — Assessment & Plan Note (Signed)
Could not tolerate the zocor, to check lipids, cont diet Lab Results  Component Value Date   LDLCALC 112* 05/29/2007

## 2011-10-22 NOTE — Assessment & Plan Note (Addendum)
Suspect GI vs prostate/bladder realted, for labs today  Note: time for pt hx, exam, record review in room with pt, determination of diagnoses with plan for further eval and tx is > 40 min

## 2011-10-22 NOTE — Assessment & Plan Note (Signed)
Also due for PSA- to do today

## 2011-10-22 NOTE — Assessment & Plan Note (Signed)
Ok to change to naproxen prn,  to f/u any worsening symptoms or concerns

## 2011-10-22 NOTE — Assessment & Plan Note (Signed)
Unclear etiology - for MRI head, and refer opthomology

## 2011-10-22 NOTE — Assessment & Plan Note (Signed)
?   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

## 2011-10-22 NOTE — Patient Instructions (Addendum)
OK to stop the mobic and zocor as you have done Take all new medications as prescribed - the naproxen Continue all other medications as before Please have the pharmacy call with any refills you may need. You will be contacted regarding the referral for: MRI for the brain, as well as opthomology and urology referrals Please go to LAB in the Basement for the blood and/or urine tests to be done today You will be contacted by phone if any changes need to be made immediately.  Otherwise, you will receive a letter about your results with an explanation. Please return in 6 months, or sooner if needed

## 2011-10-22 NOTE — Assessment & Plan Note (Signed)
stable overall by hx and exam, most recent data reviewed with pt, and pt to continue medical treatment as before Lab Results  Component Value Date   WBC 11.5* 10/17/2010   HGB 16.5 10/17/2010   HCT 47.8 10/17/2010   PLT 249.0 10/17/2010   GLUCOSE 81 10/17/2010   CHOL 252* 10/17/2010   TRIG 102.0 10/17/2010   HDL 43.70 10/17/2010   LDLDIRECT 191.1 10/17/2010   LDLCALC 112* 05/29/2007   ALT 14 10/17/2010   AST 17 10/17/2010   NA 139 10/17/2010   K 4.6 10/17/2010   CL 103 10/17/2010   CREATININE 0.8 10/17/2010   BUN 12 10/17/2010   CO2 29 10/17/2010   TSH 3.71 10/17/2010   PSA 0.71 04/18/2010

## 2011-10-29 ENCOUNTER — Other Ambulatory Visit: Payer: Self-pay

## 2011-10-29 ENCOUNTER — Ambulatory Visit
Admission: RE | Admit: 2011-10-29 | Discharge: 2011-10-29 | Disposition: A | Discharge status: Home / Self Care | Payer: Medicare Other | Source: Ambulatory Visit | Attending: Internal Medicine | Admitting: Internal Medicine

## 2011-10-29 DIAGNOSIS — H5460 Unqualified visual loss, one eye, unspecified: Secondary | ICD-10-CM

## 2011-10-29 DIAGNOSIS — H5461 Unqualified visual loss, right eye, normal vision left eye: Secondary | ICD-10-CM

## 2011-10-29 DIAGNOSIS — Z1389 Encounter for screening for other disorder: Secondary | ICD-10-CM | POA: Diagnosis not present

## 2011-10-29 DIAGNOSIS — R51 Headache: Secondary | ICD-10-CM | POA: Diagnosis not present

## 2011-10-29 DIAGNOSIS — H539 Unspecified visual disturbance: Secondary | ICD-10-CM | POA: Diagnosis not present

## 2011-12-21 ENCOUNTER — Ambulatory Visit (INDEPENDENT_AMBULATORY_CARE_PROVIDER_SITE_OTHER): Payer: Medicare Other | Admitting: Urology

## 2011-12-21 DIAGNOSIS — R3915 Urgency of urination: Secondary | ICD-10-CM | POA: Diagnosis not present

## 2011-12-21 DIAGNOSIS — N400 Enlarged prostate without lower urinary tract symptoms: Secondary | ICD-10-CM | POA: Diagnosis not present

## 2011-12-21 DIAGNOSIS — R350 Frequency of micturition: Secondary | ICD-10-CM | POA: Diagnosis not present

## 2011-12-21 DIAGNOSIS — N3941 Urge incontinence: Secondary | ICD-10-CM | POA: Diagnosis not present

## 2011-12-31 DIAGNOSIS — R82998 Other abnormal findings in urine: Secondary | ICD-10-CM | POA: Diagnosis not present

## 2011-12-31 DIAGNOSIS — K5730 Diverticulosis of large intestine without perforation or abscess without bleeding: Secondary | ICD-10-CM | POA: Diagnosis not present

## 2011-12-31 DIAGNOSIS — N400 Enlarged prostate without lower urinary tract symptoms: Secondary | ICD-10-CM | POA: Diagnosis not present

## 2012-01-07 ENCOUNTER — Other Ambulatory Visit: Payer: Self-pay | Admitting: Internal Medicine

## 2012-04-28 ENCOUNTER — Ambulatory Visit (INDEPENDENT_AMBULATORY_CARE_PROVIDER_SITE_OTHER): Payer: Medicare Other | Admitting: Internal Medicine

## 2012-04-28 ENCOUNTER — Encounter: Payer: Self-pay | Admitting: Internal Medicine

## 2012-04-28 VITALS — BP 122/80 | HR 53 | Temp 99.0°F | Ht 68.0 in | Wt 173.4 lb

## 2012-04-28 DIAGNOSIS — R002 Palpitations: Secondary | ICD-10-CM | POA: Diagnosis not present

## 2012-04-28 DIAGNOSIS — G4700 Insomnia, unspecified: Secondary | ICD-10-CM

## 2012-04-28 DIAGNOSIS — N400 Enlarged prostate without lower urinary tract symptoms: Secondary | ICD-10-CM

## 2012-04-28 DIAGNOSIS — E039 Hypothyroidism, unspecified: Secondary | ICD-10-CM

## 2012-04-28 DIAGNOSIS — N3941 Urge incontinence: Secondary | ICD-10-CM

## 2012-04-28 DIAGNOSIS — Z23 Encounter for immunization: Secondary | ICD-10-CM

## 2012-04-28 DIAGNOSIS — M1990 Unspecified osteoarthritis, unspecified site: Secondary | ICD-10-CM

## 2012-04-28 HISTORY — DX: Benign prostatic hyperplasia without lower urinary tract symptoms: N40.0

## 2012-04-28 HISTORY — DX: Insomnia, unspecified: G47.00

## 2012-04-28 HISTORY — DX: Urge incontinence: N39.41

## 2012-04-28 MED ORDER — NAPROXEN 500 MG PO TABS: 500.0000 mg | ORAL_TABLET | Freq: Two times a day (BID) | ORAL | Status: AC

## 2012-04-28 MED ORDER — ZOLPIDEM TARTRATE 5 MG PO TABS: 5.0000 mg | ORAL_TABLET | Freq: Every evening | ORAL | Status: DC | PRN

## 2012-04-28 MED ORDER — LEVOTHYROXINE SODIUM 112 MCG PO TABS: 112.0000 ug | ORAL_TABLET | Freq: Every day | ORAL | Status: DC

## 2012-04-28 MED ORDER — SOLIFENACIN SUCCINATE 5 MG PO TABS: 10.0000 mg | ORAL_TABLET | Freq: Every day | ORAL | Status: DC

## 2012-04-28 NOTE — Assessment & Plan Note (Signed)
stable overall by hx and exam, most recent data reviewed with pt, and pt to continue medical treatment as before Lab Results  Component Value Date   TSH 4.18 10/22/2011

## 2012-04-28 NOTE — Assessment & Plan Note (Addendum)
ECG reviewed as per emr, rare intermittent symptoms without assoc symtpoms such as sob, dizziness, ok to  to f/u any worsening symptoms or concerns
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
## 2012-04-28 NOTE — Patient Instructions (Addendum)
Your EKG was ok today Take all new medications as prescribed - the generic ambien to help with sleep You had the flu shot today Your medications were refilled today as requested Your handicap parking form was signed Please return if the palpitations cause any other symptoms, to consider a Holter Monitor Please keep your appointments with your specialists as you have planned Please continue your efforts at being more active, low cholesterol diet, and weight control. Please return in 6 months, or sooner if needed

## 2012-04-28 NOTE — Assessment & Plan Note (Signed)
For ambien prn,  to f/u any worsening symptoms or concerns  

## 2012-04-28 NOTE — Assessment & Plan Note (Signed)
stable overall by hx and exam, most recent data reviewed with pt, and pt to continue medical treatment as before, for refills today 

## 2012-10-06 ENCOUNTER — Telehealth: Payer: Self-pay

## 2012-10-06 NOTE — Telephone Encounter (Signed)
Ok for verbal 

## 2012-10-06 NOTE — Telephone Encounter (Signed)
Pharmacy informed.

## 2012-10-06 NOTE — Telephone Encounter (Signed)
Ok to change synthroid to a different generic brand per pharmacy.  Call back [facility] [phone]

## 2012-10-27 ENCOUNTER — Encounter: Payer: Self-pay | Admitting: Internal Medicine

## 2012-10-27 ENCOUNTER — Other Ambulatory Visit (INDEPENDENT_AMBULATORY_CARE_PROVIDER_SITE_OTHER): Payer: Medicare Other

## 2012-10-27 ENCOUNTER — Ambulatory Visit (INDEPENDENT_AMBULATORY_CARE_PROVIDER_SITE_OTHER): Payer: Medicare Other | Admitting: Internal Medicine

## 2012-10-27 VITALS — BP 130/76 | HR 56 | Temp 98.1°F | Wt 175.0 lb

## 2012-10-27 DIAGNOSIS — K219 Gastro-esophageal reflux disease without esophagitis: Secondary | ICD-10-CM | POA: Diagnosis not present

## 2012-10-27 DIAGNOSIS — N320 Bladder-neck obstruction: Secondary | ICD-10-CM | POA: Diagnosis not present

## 2012-10-27 DIAGNOSIS — E039 Hypothyroidism, unspecified: Secondary | ICD-10-CM | POA: Diagnosis not present

## 2012-10-27 DIAGNOSIS — E785 Hyperlipidemia, unspecified: Secondary | ICD-10-CM

## 2012-10-27 DIAGNOSIS — G894 Chronic pain syndrome: Secondary | ICD-10-CM | POA: Diagnosis not present

## 2012-10-27 LAB — BASIC METABOLIC PANEL
BUN: 10 mg/dL (ref 6–23)
CO2: 26 mEq/L (ref 19–32)
Calcium: 9.8 mg/dL (ref 8.4–10.5)
Chloride: 105 mEq/L (ref 96–112)
Creatinine, Ser: 0.7 mg/dL (ref 0.4–1.5)
GFR: 130.38 mL/min (ref >60.00)
Glucose, Bld: 87 mg/dL (ref 70–99)
Potassium: 4.9 mEq/L (ref 3.5–5.1)
Sodium: 138 mEq/L (ref 135–145)

## 2012-10-27 LAB — HEPATIC FUNCTION PANEL
ALT: 14 U/L (ref 0–53)
AST: 19 U/L (ref 0–37)
Albumin: 3.9 g/dL (ref 3.5–5.2)
Alkaline Phosphatase: 81 U/L (ref 39–117)
Bilirubin, Direct: 0.1 mg/dL (ref 0.0–0.3)
Total Bilirubin: 0.8 mg/dL (ref 0.3–1.2)
Total Protein: 6.9 g/dL (ref 6.0–8.3)

## 2012-10-27 LAB — LIPID PANEL
Cholesterol: 222 mg/dL — ABNORMAL HIGH (ref 0–200)
HDL: 38.3 mg/dL — ABNORMAL LOW (ref >39.00)
Total CHOL/HDL Ratio: 6
Triglycerides: 94 mg/dL (ref 0.0–149.0)
VLDL: 18.8 mg/dL (ref 0.0–40.0)

## 2012-10-27 LAB — TSH: TSH: 4.18 u[IU]/mL (ref 0.35–5.50)

## 2012-10-27 LAB — CBC WITH DIFFERENTIAL/PLATELET
Basophils Absolute: 0 10*3/uL (ref 0.0–0.1)
Basophils Relative: 0.5 % (ref 0.0–3.0)
Eosinophils Absolute: 0.1 10*3/uL (ref 0.0–0.7)
Eosinophils Relative: 1.5 % (ref 0.0–5.0)
HCT: 48 % (ref 39.0–52.0)
Hemoglobin: 16.6 g/dL (ref 13.0–17.0)
Lymphocytes Relative: 27.4 % (ref 12.0–46.0)
Lymphs Abs: 2.5 10*3/uL (ref 0.7–4.0)
MCHC: 34.6 g/dL (ref 30.0–36.0)
MCV: 93.3 fl (ref 78.0–100.0)
Monocytes Absolute: 0.8 10*3/uL (ref 0.1–1.0)
Monocytes Relative: 8.8 % (ref 3.0–12.0)
Neutro Abs: 5.6 10*3/uL (ref 1.4–7.7)
Neutrophils Relative %: 61.8 % (ref 43.0–77.0)
Platelets: 250 10*3/uL (ref 150.0–400.0)
RBC: 5.14 Mil/uL (ref 4.22–5.81)
RDW: 13.3 % (ref 11.5–14.6)
WBC: 9.1 10*3/uL (ref 4.5–10.5)

## 2012-10-27 LAB — URINALYSIS, ROUTINE W REFLEX MICROSCOPIC
Bilirubin Urine: NEGATIVE
Ketones, ur: NEGATIVE
Leukocytes, UA: NEGATIVE
Nitrite: NEGATIVE
Specific Gravity, Urine: <=1.005 (ref 1.000–1.030)
Total Protein, Urine: NEGATIVE
Urine Glucose: NEGATIVE
Urobilinogen, UA: 0.2 (ref 0.0–1.0)
pH: 6 (ref 5.0–8.0)

## 2012-10-27 LAB — LDL CHOLESTEROL, DIRECT: Direct LDL: 167.3 mg/dL

## 2012-10-27 LAB — PSA: PSA: 0.83 ng/mL (ref 0.10–4.00)

## 2012-10-27 MED ORDER — HYDROCODONE-ACETAMINOPHEN 7.5-325 MG PO TABS: 1.0000 | ORAL_TABLET | Freq: Four times a day (QID) | ORAL | Status: DC | PRN

## 2012-10-27 NOTE — Patient Instructions (Addendum)
Please continue all other medications as before, and refills have been done if requested - the hydrocodone Please have the pharmacy call with any other refills you may need. You are otherwise up to date with prevention measures today. Please continue your efforts at being more active, low cholesterol diet, and weight control. Please go to the LAB in the Basement (turn left off the elevator) for the tests to be done today You will be contacted by phone if any changes need to be made immediately.  Otherwise, you will receive a letter about your results with an explanation Please remember to sign up for My Chart if you have not done so, as this will be important to you in the future with finding out test results, communicating by private email, and scheduling acute appointments online when needed.

## 2012-10-27 NOTE — Assessment & Plan Note (Signed)
Recently changed from brand name to generic with ? More fatigue - for f/u tsh Lab Results  Component Value Date   TSH 4.18 10/22/2011
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
## 2012-10-27 NOTE — Assessment & Plan Note (Signed)
stable overall by history and exam, recent data reviewed with pt, and pt to continue medical treatment as before,  to f/u any worsening symptoms or concerns For labs today to f/u, has been statin intolerant in past

## 2012-10-27 NOTE — Assessment & Plan Note (Signed)
stable overall by history and exam, recent data reviewed with pt, and pt to continue medical treatment as before,  to f/u any worsening symptoms or concerns Lab Results  Component Value Date   WBC 12.9* 10/22/2011   HGB 15.7 10/22/2011   HCT 47.0 10/22/2011   PLT 243.0 10/22/2011   GLUCOSE 86 10/22/2011   CHOL 212* 10/22/2011   TRIG 190.0* 10/22/2011   HDL 37.10* 10/22/2011   LDLDIRECT 141.6 10/22/2011   LDLCALC 112* 05/29/2007   ALT 14 10/22/2011   AST 18 10/22/2011   NA 137 10/22/2011   K 4.3 10/22/2011   CL 101 10/22/2011   CREATININE 0.7 10/22/2011   BUN 12 10/22/2011   CO2 24 10/22/2011   TSH 4.18 10/22/2011   PSA 1.26 10/22/2011   For refill today

## 2012-10-27 NOTE — Assessment & Plan Note (Signed)
stable overall by history and exam, , and pt to continue medical treatment as before,  to f/u any worsening symptoms or concerns  

## 2013-04-30 ENCOUNTER — Ambulatory Visit (INDEPENDENT_AMBULATORY_CARE_PROVIDER_SITE_OTHER): Payer: Medicare Other | Admitting: Internal Medicine

## 2013-04-30 ENCOUNTER — Encounter: Payer: Self-pay | Admitting: Internal Medicine

## 2013-04-30 VITALS — BP 118/82 | HR 66 | Temp 98.0°F | Ht 67.0 in | Wt 168.5 lb

## 2013-04-30 DIAGNOSIS — E785 Hyperlipidemia, unspecified: Secondary | ICD-10-CM

## 2013-04-30 DIAGNOSIS — IMO0002 Reserved for concepts with insufficient information to code with codable children: Secondary | ICD-10-CM | POA: Diagnosis not present

## 2013-04-30 DIAGNOSIS — E039 Hypothyroidism, unspecified: Secondary | ICD-10-CM

## 2013-04-30 DIAGNOSIS — Z23 Encounter for immunization: Secondary | ICD-10-CM | POA: Diagnosis not present

## 2013-04-30 DIAGNOSIS — M1710 Unilateral primary osteoarthritis, unspecified knee: Secondary | ICD-10-CM | POA: Diagnosis not present

## 2013-04-30 DIAGNOSIS — G894 Chronic pain syndrome: Secondary | ICD-10-CM

## 2013-04-30 MED ORDER — HYDROCODONE-ACETAMINOPHEN 7.5-325 MG PO TABS: 1.0000 | ORAL_TABLET | Freq: Four times a day (QID) | ORAL | Status: DC | PRN

## 2013-04-30 MED ORDER — EZETIMIBE 10 MG PO TABS: 10.0000 mg | ORAL_TABLET | Freq: Every day | ORAL | Status: DC

## 2013-04-30 MED ORDER — PRAVASTATIN SODIUM 20 MG PO TABS: 20.0000 mg | ORAL_TABLET | Freq: Every day | ORAL | Status: DC

## 2013-04-30 NOTE — Assessment & Plan Note (Signed)
Right knee, low freq knee pain hydrocodone use, The current medical regimen is effective; for refills today
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
## 2013-04-30 NOTE — Patient Instructions (Addendum)
You had the flu shot today Please take all new medication as prescribed  - the pravastatin to at least take a small amount to get some cholesterol down Please continue all other medications as before, and refills have been done if requested - the zetia as well as your other medications below, and the hydrocodone  Please keep your appointments with your specialists as you have planned - orthopedic  Please return in 6 months, or sooner if needed

## 2013-04-30 NOTE — Assessment & Plan Note (Signed)
Intol of nsaid per pt, exam ok today,for ortho f/u, encouraged for surgury as per Dr Madelon Lips

## 2013-04-30 NOTE — Assessment & Plan Note (Signed)
stable overall by history and exam, recent data reviewed with pt, and pt to continue medical treatment as before,  to f/u any worsening symptoms or concerns Lab Results  Component Value Date   TSH 4.18 10/27/2012    

## 2013-04-30 NOTE — Assessment & Plan Note (Signed)
To add pravachol 20 qd, though has been intol of 3 other meds, maybe able to tolerate a lower dose to at least get some LDL reduction, even if not to goal < 100

## 2013-04-30 NOTE — Progress Notes (Signed)
Pre-visit discussion using our clinic review tool. No additional management support is needed unless otherwise documented below in the visit note.  

## 2013-05-27 ENCOUNTER — Encounter: Payer: Self-pay | Admitting: Gastroenterology

## 2013-09-25 ENCOUNTER — Other Ambulatory Visit: Payer: Self-pay | Admitting: Internal Medicine

## 2013-10-29 ENCOUNTER — Encounter: Payer: Self-pay | Admitting: Internal Medicine

## 2013-10-29 ENCOUNTER — Encounter: Payer: Self-pay | Admitting: Gastroenterology

## 2013-10-29 ENCOUNTER — Ambulatory Visit (INDEPENDENT_AMBULATORY_CARE_PROVIDER_SITE_OTHER): Payer: Medicare Other | Admitting: Internal Medicine

## 2013-10-29 ENCOUNTER — Other Ambulatory Visit (INDEPENDENT_AMBULATORY_CARE_PROVIDER_SITE_OTHER): Payer: Medicare Other

## 2013-10-29 VITALS — BP 130/72 | HR 59 | Temp 98.1°F | Ht 68.0 in | Wt 167.0 lb

## 2013-10-29 DIAGNOSIS — E039 Hypothyroidism, unspecified: Secondary | ICD-10-CM

## 2013-10-29 DIAGNOSIS — N320 Bladder-neck obstruction: Secondary | ICD-10-CM | POA: Diagnosis not present

## 2013-10-29 DIAGNOSIS — IMO0002 Reserved for concepts with insufficient information to code with codable children: Secondary | ICD-10-CM | POA: Diagnosis not present

## 2013-10-29 DIAGNOSIS — E785 Hyperlipidemia, unspecified: Secondary | ICD-10-CM

## 2013-10-29 DIAGNOSIS — Z0000 Encounter for general adult medical examination without abnormal findings: Secondary | ICD-10-CM

## 2013-10-29 DIAGNOSIS — M1710 Unilateral primary osteoarthritis, unspecified knee: Secondary | ICD-10-CM | POA: Diagnosis not present

## 2013-10-29 LAB — URINALYSIS, ROUTINE W REFLEX MICROSCOPIC
Bilirubin Urine: NEGATIVE
Ketones, ur: NEGATIVE
Leukocytes, UA: NEGATIVE
Nitrite: NEGATIVE
Specific Gravity, Urine: 1.01 (ref 1.000–1.030)
Total Protein, Urine: NEGATIVE
Urine Glucose: NEGATIVE
Urobilinogen, UA: 0.2 (ref 0.0–1.0)
pH: 5.5 (ref 5.0–8.0)

## 2013-10-29 LAB — BASIC METABOLIC PANEL
BUN: 9 mg/dL (ref 6–23)
CO2: 26 mEq/L (ref 19–32)
Calcium: 9.6 mg/dL (ref 8.4–10.5)
Chloride: 102 mEq/L (ref 96–112)
Creatinine, Ser: 0.8 mg/dL (ref 0.4–1.5)
GFR: 107.17 mL/min (ref >60.00)
Glucose, Bld: 85 mg/dL (ref 70–99)
Potassium: 4.5 mEq/L (ref 3.5–5.1)
Sodium: 137 mEq/L (ref 135–145)

## 2013-10-29 LAB — PSA: PSA: 1.07 ng/mL (ref 0.10–4.00)

## 2013-10-29 LAB — CBC WITH DIFFERENTIAL/PLATELET
Basophils Absolute: 0.1 10*3/uL (ref 0.0–0.1)
Basophils Relative: 0.6 % (ref 0.0–3.0)
Eosinophils Absolute: 0.1 10*3/uL (ref 0.0–0.7)
Eosinophils Relative: 1.1 % (ref 0.0–5.0)
HCT: 49.2 % (ref 39.0–52.0)
Hemoglobin: 16.6 g/dL (ref 13.0–17.0)
Lymphocytes Relative: 19.5 % (ref 12.0–46.0)
Lymphs Abs: 2.5 10*3/uL (ref 0.7–4.0)
MCHC: 33.8 g/dL (ref 30.0–36.0)
MCV: 94.8 fl (ref 78.0–100.0)
Monocytes Absolute: 0.8 10*3/uL (ref 0.1–1.0)
Monocytes Relative: 6 % (ref 3.0–12.0)
Neutro Abs: 9.3 10*3/uL — ABNORMAL HIGH (ref 1.4–7.7)
Neutrophils Relative %: 72.8 % (ref 43.0–77.0)
Platelets: 260 10*3/uL (ref 150.0–400.0)
RBC: 5.19 Mil/uL (ref 4.22–5.81)
RDW: 13.4 % (ref 11.5–15.5)
WBC: 12.8 10*3/uL — ABNORMAL HIGH (ref 4.0–10.5)

## 2013-10-29 LAB — HEPATIC FUNCTION PANEL
ALT: 12 U/L (ref 0–53)
AST: 17 U/L (ref 0–37)
Albumin: 3.9 g/dL (ref 3.5–5.2)
Alkaline Phosphatase: 83 U/L (ref 39–117)
Bilirubin, Direct: 0.1 mg/dL (ref 0.0–0.3)
Total Bilirubin: 0.7 mg/dL (ref 0.2–1.2)
Total Protein: 6.9 g/dL (ref 6.0–8.3)

## 2013-10-29 LAB — LIPID PANEL
Cholesterol: 238 mg/dL — ABNORMAL HIGH (ref 0–200)
HDL: 41.3 mg/dL (ref >39.00)
LDL Cholesterol: 173 mg/dL — ABNORMAL HIGH (ref 0–99)
Total CHOL/HDL Ratio: 6
Triglycerides: 118 mg/dL (ref 0.0–149.0)
VLDL: 23.6 mg/dL (ref 0.0–40.0)

## 2013-10-29 LAB — TSH: TSH: 7.22 u[IU]/mL — ABNORMAL HIGH (ref 0.35–4.50)

## 2013-10-29 MED ORDER — HYDROCODONE-ACETAMINOPHEN 7.5-325 MG PO TABS: 1.0000 | ORAL_TABLET | Freq: Four times a day (QID) | ORAL | Status: DC | PRN

## 2013-10-29 MED ORDER — PRAVASTATIN SODIUM 20 MG PO TABS
20.0000 mg | ORAL_TABLET | Freq: Every day | ORAL | Status: DC
Start: 2013-10-29 — End: 2013-10-30

## 2013-10-29 MED ORDER — EZETIMIBE 10 MG PO TABS: 10.0000 mg | ORAL_TABLET | Freq: Every day | ORAL | Status: DC

## 2013-10-29 MED ORDER — SOLIFENACIN SUCCINATE 5 MG PO TABS: 10.0000 mg | ORAL_TABLET | Freq: Every day | ORAL | Status: DC

## 2013-10-29 MED ORDER — LEVOTHYROXINE SODIUM 112 MCG PO TABS: ORAL_TABLET | ORAL | Status: DC

## 2013-10-29 MED ORDER — OMEPRAZOLE 20 MG PO CPDR: 20.0000 mg | DELAYED_RELEASE_CAPSULE | Freq: Every day | ORAL | Status: DC
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
## 2013-10-29 NOTE — Assessment & Plan Note (Signed)
stable overall by history and exam, recent data reviewed with pt, and pt to continue medical treatment as before,  to f/u any worsening symptoms or concerns Lab Results  Component Value Date   LDLCALC 112* 05/29/2007

## 2013-10-29 NOTE — Assessment & Plan Note (Signed)
Severe, end stage , needs new ortho - for ortho referral

## 2013-10-29 NOTE — Assessment & Plan Note (Signed)
stable overall by history and exam, recent data reviewed with pt, and pt to continue medical treatment as before,  to f/u any worsening symptoms or concerns Lab Results  Component Value Date   TSH 4.18 10/27/2012

## 2013-10-29 NOTE — Assessment & Plan Note (Signed)
Not charged but pt due for f/u colonsocpy according to 2010 documentation; pt states will present to 3rd flr today/GI to arrange himself

## 2013-10-29 NOTE — Patient Instructions (Signed)
Please continue all other medications as before, and refills have been done, including the hydrocodone  Please have the pharmacy call with any other refills you may need.  Please continue your efforts at being more active, low cholesterol diet, and weight control.  You are otherwise up to date with prevention measures today.  Please keep your appointments with your specialists as you may have planned  You will be contacted regarding the referral for: orthopedice  Please go to 3rd floor today as you have planned to see about the follow up colonoscopy  Please go to the LAB in the Basement (turn left off the elevator) for the tests to be done today  You will be contacted by phone if any changes need to be made immediately.  Otherwise, you will receive a letter about your results with an explanation, but please check with MyChart first.  Please remember to sign up for MyChart if you have not done so, as this will be important to you in the future with finding out test results, communicating by private email, and scheduling acute appointments online when needed.  Please return in 6 months, or sooner if needed

## 2013-10-29 NOTE — Progress Notes (Signed)
Pre visit review using our clinic review tool, if applicable. No additional management support is needed unless otherwise documented below in the visit note. 

## 2013-10-29 NOTE — Assessment & Plan Note (Signed)
Also for psa as he is due 

## 2013-10-30 ENCOUNTER — Encounter: Payer: Self-pay | Admitting: Internal Medicine

## 2013-10-30 ENCOUNTER — Other Ambulatory Visit: Payer: Self-pay | Admitting: Internal Medicine

## 2013-10-30 MED ORDER — LEVOTHYROXINE SODIUM 125 MCG PO TABS: 125.0000 ug | ORAL_TABLET | Freq: Every day | ORAL | Status: DC

## 2013-10-30 MED ORDER — PRAVASTATIN SODIUM 40 MG PO TABS: 40.0000 mg | ORAL_TABLET | Freq: Every day | ORAL | Status: DC

## 2013-12-08 ENCOUNTER — Ambulatory Visit (AMBULATORY_SURGERY_CENTER): Payer: Self-pay

## 2013-12-08 VITALS — Ht 68.0 in | Wt 167.0 lb

## 2013-12-08 DIAGNOSIS — Z860100 Personal history of colon polyps, unspecified: Secondary | ICD-10-CM

## 2013-12-08 DIAGNOSIS — M25569 Pain in unspecified knee: Secondary | ICD-10-CM | POA: Diagnosis not present

## 2013-12-08 MED ORDER — MOVIPREP 100 G PO SOLR: 1.0000 | Freq: Once | ORAL | Status: DC

## 2013-12-08 NOTE — Progress Notes (Signed)
No allergies to eggs or soy No diet/weight loss meds No home oxygen No past problems with anesthesia  No email History of complications following procedure/transferred to [facility] via ambulance Pt stated he coded 2 times

## 2013-12-22 ENCOUNTER — Encounter: Payer: Self-pay | Admitting: Gastroenterology

## 2013-12-22 ENCOUNTER — Ambulatory Visit (AMBULATORY_SURGERY_CENTER): Payer: Medicare Other | Admitting: Gastroenterology

## 2013-12-22 VITALS — BP 126/45 | HR 48 | Temp 96.7°F | Resp 19 | Ht 68.0 in | Wt 167.0 lb

## 2013-12-22 DIAGNOSIS — E039 Hypothyroidism, unspecified: Secondary | ICD-10-CM | POA: Diagnosis not present

## 2013-12-22 DIAGNOSIS — G894 Chronic pain syndrome: Secondary | ICD-10-CM | POA: Diagnosis not present

## 2013-12-22 DIAGNOSIS — Z86010 Personal history of colonic polyps: Secondary | ICD-10-CM | POA: Diagnosis not present

## 2013-12-22 DIAGNOSIS — G4700 Insomnia, unspecified: Secondary | ICD-10-CM | POA: Diagnosis not present

## 2013-12-22 DIAGNOSIS — R002 Palpitations: Secondary | ICD-10-CM | POA: Diagnosis not present

## 2013-12-22 DIAGNOSIS — D126 Benign neoplasm of colon, unspecified: Secondary | ICD-10-CM

## 2013-12-22 DIAGNOSIS — M545 Low back pain, unspecified: Secondary | ICD-10-CM | POA: Diagnosis not present

## 2013-12-22 DIAGNOSIS — K5730 Diverticulosis of large intestine without perforation or abscess without bleeding: Secondary | ICD-10-CM

## 2013-12-22 DIAGNOSIS — G2581 Restless legs syndrome: Secondary | ICD-10-CM | POA: Diagnosis not present

## 2013-12-22 MED ORDER — SODIUM CHLORIDE 0.9 % IV SOLN: 500.0000 mL | INTRAVENOUS | Status: DC

## 2013-12-22 NOTE — Op Note (Signed)
[place]  [address]. [place], [id number]   COLONOSCOPY PROCEDURE REPORT  PATIENT: Guy Morris, Guy Morris  MR#: [id number] BIRTHDATE: December 19, 1950 , 63  yrs. old GENDER: Male ENDOSCOPIST: Milus Banister, MD PROCEDURE DATE:  12/22/2013 PROCEDURE:   Colonoscopy with snare polypectomy First Screening Colonoscopy - Avg.  risk and is 50 yrs.  old or older - No.  Prior Negative Screening - Now for repeat screening. N/A  History of Adenoma - Now for follow-up colonoscopy & has been > or = to 3 yrs.  Yes hx of adenoma.  Has been 3 or more years since last colonoscopy.  Polyps Removed Today? Yes. ASA CLASS:   Class II INDICATIONS:mutiple adenomatous polyps 2009, 2010 (including one TA with small focus of HGD in 2009). MEDICATIONS: MAC sedation, administered by CRNA and Propofol (Diprivan) 260 mg IV  DESCRIPTION OF PROCEDURE:   After the risks benefits and alternatives of the procedure were thoroughly explained, informed consent was obtained.  A digital rectal exam revealed no abnormalities of the rectum.   The LB [id number]  endoscope was introduced through the anus and advanced to the cecum, which was identified by both the appendix and ileocecal valve. No adverse events experienced.   The quality of the prep was good.  The instrument was then slowly withdrawn as the colon was fully examined.  COLON FINDINGS: Three polyps were found, removed and sent to pathology.  All of these were sessile.  One was 19mm across, ascending segment, removed with cold snare (jar 1).  One was transverse segment, 44mm across, removed with cold snare (jar 1). The last was in descending segment 33mm across, removed with cold snare (jar 2).  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 The examination was otherwise normal.  Retroflexed views revealed no abnormalities. The time to cecum=3 minutes 01 seconds. Withdrawal time=16 minutes 58 seconds.  The scope was withdrawn and the  procedure completed. COMPLICATIONS: There were no complications.  ENDOSCOPIC IMPRESSION: Three polyps were found, removed and sent to pathology. 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  The examination was otherwise normal.  RECOMMENDATIONS: If the polyp(s) removed today are proven to be adenomatous (pre-cancerous) polyps, you will need a colonoscopy in 3-5 years. Otherwise you should continue to follow colorectal cancer screening guidelines for "routine risk" patients with a colonoscopy in 10 years.  You will receive a letter within 1-2 weeks with the results of your biopsy as well as final recommendations.  Please call my office if you have not received a letter after 3 weeks.   eSigned:  Milus Banister, MD 12/22/2013 9:16 AM   OI:[id number] Jenny Reichmann, MD

## 2013-12-22 NOTE — Progress Notes (Signed)
Report to PACU, RN, vss, BBS= Clear.  

## 2013-12-22 NOTE — Progress Notes (Signed)
Called to room to assist during endoscopic procedure.  Patient ID and intended procedure confirmed with present staff. Received instructions for my participation in the procedure from the performing physician.  

## 2013-12-22 NOTE — Patient Instructions (Signed)
Discharge instructions given with verbal understanding. Handouts on polyps and diverticulosis. Resume previous medications. YOU HAD AN ENDOSCOPIC PROCEDURE TODAY AT THE [facility]: Refer to the procedure report that was given to you for any specific questions about what was found during the examination.  If the procedure report does not answer your questions, please call your gastroenterologist to clarify.  If you requested that your care partner not be given the details of your procedure findings, then the procedure report has been included in a sealed envelope for you to review at your convenience later.  YOU SHOULD EXPECT: Some feelings of bloating in the abdomen. Passage of more gas than usual.  Walking can help get rid of the air that was put into your GI tract during the procedure and reduce the bloating. If you had a lower endoscopy (such as a colonoscopy or flexible sigmoidoscopy) you may notice spotting of blood in your stool or on the toilet paper. If you underwent a bowel prep for your procedure, then you may not have a normal bowel movement for a few days.  DIET: Your first meal following the procedure should be a light meal and then it is ok to progress to your normal diet.  A half-sandwich or bowl of soup is an example of a good first meal.  Heavy or fried foods are harder to digest and may make you feel nauseous or bloated.  Likewise meals heavy in dairy and vegetables can cause extra gas to form and this can also increase the bloating.  Drink plenty of fluids but you should avoid alcoholic beverages for 24 hours.  ACTIVITY: Your care partner should take you home directly after the procedure.  You should plan to take it easy, moving slowly for the rest of the day.  You can resume normal activity the day after the procedure however you should NOT DRIVE or use heavy machinery for 24 hours (because of the sedation medicines used during the test).    SYMPTOMS TO REPORT  IMMEDIATELY: A gastroenterologist can be reached at any hour.  During normal business hours, 8:30 AM to 5:00 PM Monday through Friday, call [phone].  After hours and on weekends, please call the GI answering service at [phone] who will take a message and have the physician on call contact you.   Following lower endoscopy (colonoscopy or flexible sigmoidoscopy):  Excessive amounts of blood in the stool  Significant tenderness or worsening of abdominal pains  Swelling of the abdomen that is new, acute  Fever of 100F or higher  FOLLOW UP: If any biopsies were taken you will be contacted by phone or by letter within the next 1-3 weeks.  Call your gastroenterologist if you have not heard about the biopsies in 3 weeks.  Our staff will call the home number listed on your records the next business day following your procedure to check on you and address any questions or concerns that you may have at that time regarding the information given to you following your procedure. This is a courtesy call and so if there is no answer at the home number and we have not heard from you through the emergency physician on call, we will assume that you have returned to your regular daily activities without incident.  SIGNATURES/CONFIDENTIALITY: You and/or your care partner have signed paperwork which will be entered into your electronic medical record.  These signatures attest to the fact that that the information above on your After Visit Summary   has been reviewed and is understood.  Full responsibility of the confidentiality of this discharge information lies with you and/or your care-partner. 

## 2013-12-23 ENCOUNTER — Telehealth: Payer: Self-pay | Admitting: *Deleted

## 2013-12-23 NOTE — Telephone Encounter (Signed)
  Follow up Call-  Call back number 12/22/2013  Post procedure Call Back phone  # [phone]  Permission to leave phone message Yes     Patient questions:  Do you have a fever, pain , or abdominal swelling? No. Pain Score  0 *  Have you tolerated food without any problems? Yes.    Have you been able to return to your normal activities? Yes.    Do you have any questions about your discharge instructions: Diet   No. Medications  No. Follow up visit  No.  Do you have questions or concerns about your Care? No.  Actions: * If pain score is 4 or above: No action needed, pain <4.

## 2014-01-04 ENCOUNTER — Encounter: Payer: Self-pay | Admitting: Gastroenterology

## 2014-01-25 ENCOUNTER — Other Ambulatory Visit (HOSPITAL_COMMUNITY): Payer: Medicare Other

## 2014-02-05 ENCOUNTER — Inpatient Hospital Stay (HOSPITAL_COMMUNITY): Admission: RE | Admit: 2014-02-05 | Payer: Medicare Other | Source: Ambulatory Visit | Admitting: Orthopedic Surgery

## 2014-02-05 ENCOUNTER — Encounter (HOSPITAL_COMMUNITY): Admission: RE | Payer: Self-pay | Source: Ambulatory Visit

## 2014-02-05 SURGERY — ARTHROPLASTY, KNEE, TOTAL
Anesthesia: General | Laterality: Right

## 2014-04-29 ENCOUNTER — Encounter: Payer: Self-pay | Admitting: Internal Medicine

## 2014-04-29 ENCOUNTER — Other Ambulatory Visit (INDEPENDENT_AMBULATORY_CARE_PROVIDER_SITE_OTHER): Payer: Medicare Other

## 2014-04-29 ENCOUNTER — Ambulatory Visit (INDEPENDENT_AMBULATORY_CARE_PROVIDER_SITE_OTHER): Payer: Medicare Other | Admitting: Internal Medicine

## 2014-04-29 VITALS — BP 128/80 | HR 51 | Temp 98.1°F | Ht 67.0 in | Wt 166.5 lb

## 2014-04-29 DIAGNOSIS — R634 Abnormal weight loss: Secondary | ICD-10-CM | POA: Diagnosis not present

## 2014-04-29 DIAGNOSIS — Z23 Encounter for immunization: Secondary | ICD-10-CM | POA: Diagnosis not present

## 2014-04-29 DIAGNOSIS — E038 Other specified hypothyroidism: Secondary | ICD-10-CM | POA: Diagnosis not present

## 2014-04-29 DIAGNOSIS — E785 Hyperlipidemia, unspecified: Secondary | ICD-10-CM

## 2014-04-29 LAB — TSH: TSH: 7.23 u[IU]/mL — ABNORMAL HIGH (ref 0.35–4.50)

## 2014-04-29 LAB — LIPID PANEL
Cholesterol: 211 mg/dL — ABNORMAL HIGH (ref 0–200)
HDL: 38 mg/dL — ABNORMAL LOW (ref >39.00)
LDL Cholesterol: 151 mg/dL — ABNORMAL HIGH (ref 0–99)
NonHDL: 173
Total CHOL/HDL Ratio: 6
Triglycerides: 110 mg/dL (ref 0.0–149.0)
VLDL: 22 mg/dL (ref 0.0–40.0)

## 2014-04-29 LAB — HEPATIC FUNCTION PANEL
ALT: 13 U/L (ref 0–53)
AST: 16 U/L (ref 0–37)
Albumin: 4 g/dL (ref 3.5–5.2)
Alkaline Phosphatase: 79 U/L (ref 39–117)
Bilirubin, Direct: 0.1 mg/dL (ref 0.0–0.3)
Total Bilirubin: 0.7 mg/dL (ref 0.2–1.2)
Total Protein: 7 g/dL (ref 6.0–8.3)

## 2014-04-29 LAB — T4, FREE: Free T4: 1.12 ng/dL (ref 0.60–1.60)

## 2014-04-29 MED ORDER — LEVOTHYROXINE SODIUM 125 MCG PO TABS: 125.0000 ug | ORAL_TABLET | Freq: Every day | ORAL | Status: DC

## 2014-04-29 NOTE — Assessment & Plan Note (Addendum)
Intentional per pt, trying to avoid DM which is rampant in his family, but also for spep, exam o/w benign

## 2014-04-29 NOTE — Patient Instructions (Signed)
You had the flu shot today  .Please continue all other medications as before, and refills have been done if requested.  Please have the pharmacy call with any other refills you may need.  Please continue your efforts at being more active, low cholesterol diet, and weight control.  Please keep your appointments with your specialists as you may have planned  Please go to the LAB in the Basement (turn left off the elevator) for the tests to be done today  You will be contacted by phone if any changes need to be made immediately.  Otherwise, you will receive a letter about your results with an explanation, but please check with MyChart first.  Please remember to sign up for MyChart if you have not done so, as this will be important to you in the future with finding out test results, communicating by private email, and scheduling acute appointments online when needed.  Please return in 6 months, or sooner if needed 
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
## 2014-04-29 NOTE — Assessment & Plan Note (Addendum)
With recent change/incr levothyroxine, for f/u labs today, o.w stable overall by history and exam, recent data reviewed with pt, and pt to continue medical treatment as before,  to f/u any worsening symptoms or concerns

## 2014-04-29 NOTE — Assessment & Plan Note (Addendum)
Suspect atkins diet effect where low carb diet has resulted in relative intake more fat/chol, to cont med, refer dietary, f/u labs today on incr statin last visit Lab Results  Component Value Date   LDLCALC 173* 10/29/2013

## 2014-04-29 NOTE — Progress Notes (Signed)
Pre visit review using our clinic review tool, if applicable. No additional management support is needed unless otherwise documented below in the visit note. 

## 2014-06-24 ENCOUNTER — Ambulatory Visit (INDEPENDENT_AMBULATORY_CARE_PROVIDER_SITE_OTHER): Payer: Medicare Other | Admitting: Internal Medicine

## 2014-06-24 ENCOUNTER — Encounter: Payer: Self-pay | Admitting: Internal Medicine

## 2014-06-24 ENCOUNTER — Ambulatory Visit (INDEPENDENT_AMBULATORY_CARE_PROVIDER_SITE_OTHER)
Admission: RE | Admit: 2014-06-24 | Discharge: 2014-06-24 | Disposition: A | Discharge status: Home / Self Care | Payer: Medicare Other | Source: Ambulatory Visit | Attending: Internal Medicine | Admitting: Internal Medicine

## 2014-06-24 ENCOUNTER — Other Ambulatory Visit: Payer: Self-pay | Admitting: Internal Medicine

## 2014-06-24 VITALS — BP 120/82 | HR 56 | Temp 97.8°F | Ht 67.0 in | Wt 166.5 lb

## 2014-06-24 DIAGNOSIS — H9193 Unspecified hearing loss, bilateral: Secondary | ICD-10-CM

## 2014-06-24 DIAGNOSIS — E785 Hyperlipidemia, unspecified: Secondary | ICD-10-CM | POA: Diagnosis not present

## 2014-06-24 DIAGNOSIS — Z720 Tobacco use: Secondary | ICD-10-CM

## 2014-06-24 DIAGNOSIS — R0989 Other specified symptoms and signs involving the circulatory and respiratory systems: Secondary | ICD-10-CM

## 2014-06-24 DIAGNOSIS — H9392 Unspecified disorder of left ear: Secondary | ICD-10-CM | POA: Diagnosis not present

## 2014-06-24 DIAGNOSIS — R911 Solitary pulmonary nodule: Secondary | ICD-10-CM

## 2014-06-24 DIAGNOSIS — E038 Other specified hypothyroidism: Secondary | ICD-10-CM | POA: Diagnosis not present

## 2014-06-24 DIAGNOSIS — F17200 Nicotine dependence, unspecified, uncomplicated: Secondary | ICD-10-CM

## 2014-06-24 DIAGNOSIS — R42 Dizziness and giddiness: Secondary | ICD-10-CM | POA: Diagnosis not present

## 2014-06-24 DIAGNOSIS — J449 Chronic obstructive pulmonary disease, unspecified: Secondary | ICD-10-CM | POA: Diagnosis not present

## 2014-06-24 MED ORDER — MECLIZINE HCL 25 MG PO TABS: 25.0000 mg | ORAL_TABLET | Freq: Three times a day (TID) | ORAL | Status: DC | PRN

## 2014-06-24 NOTE — Progress Notes (Signed)
Pre visit review using our clinic review tool, if applicable. No additional management support is needed unless otherwise documented below in the visit note. 

## 2014-06-24 NOTE — Patient Instructions (Addendum)
FASTING LABS IN AM PLEASE!!! Your next office appointment will be determined based upon review of your pending labs & x-rays. Those instructions will be transmitted to you by phone or  by mail  Followup as needed for your acute issue. Please report any significant change in your symptoms.  Please take enteric-coated aspirin 81 mg daily with breakfast.  Please think about quitting smoking. Review the risks we discussed. Please call 1-800-QUIT-NOW [phone]) for free smoking cessation counseling.   Perform isometric exercise of calves  ( while seated go up on toes to count of 5 & then onto heels for 5 count). Repeat  4- 5 times prior to standing if you've been seated or supine for any significant period of time as BP drops with such positions.

## 2014-06-25 ENCOUNTER — Other Ambulatory Visit (INDEPENDENT_AMBULATORY_CARE_PROVIDER_SITE_OTHER): Payer: Medicare Other

## 2014-06-25 ENCOUNTER — Other Ambulatory Visit: Payer: Self-pay | Admitting: Internal Medicine

## 2014-06-25 ENCOUNTER — Ambulatory Visit (HOSPITAL_COMMUNITY): Payer: Medicare Other | Attending: Internal Medicine | Admitting: Cardiology

## 2014-06-25 DIAGNOSIS — R42 Dizziness and giddiness: Secondary | ICD-10-CM | POA: Diagnosis not present

## 2014-06-25 DIAGNOSIS — E038 Other specified hypothyroidism: Secondary | ICD-10-CM

## 2014-06-25 DIAGNOSIS — R0989 Other specified symptoms and signs involving the circulatory and respiratory systems: Secondary | ICD-10-CM | POA: Diagnosis not present

## 2014-06-25 DIAGNOSIS — E785 Hyperlipidemia, unspecified: Secondary | ICD-10-CM | POA: Diagnosis not present

## 2014-06-25 DIAGNOSIS — I6522 Occlusion and stenosis of left carotid artery: Secondary | ICD-10-CM | POA: Diagnosis not present

## 2014-06-25 DIAGNOSIS — R270 Ataxia, unspecified: Secondary | ICD-10-CM

## 2014-06-25 LAB — BASIC METABOLIC PANEL
BUN: 12 mg/dL (ref 6–23)
CO2: 24 mEq/L (ref 19–32)
Calcium: 10.1 mg/dL (ref 8.4–10.5)
Chloride: 106 mEq/L (ref 96–112)
Creatinine, Ser: 0.87 mg/dL (ref 0.40–1.50)
GFR: 94.28 mL/min (ref >60.00)
Glucose, Bld: 96 mg/dL (ref 70–99)
Potassium: 5.1 mEq/L (ref 3.5–5.1)
Sodium: 138 mEq/L (ref 135–145)

## 2014-06-25 LAB — CBC WITH DIFFERENTIAL/PLATELET
Basophils Absolute: 0.1 10*3/uL (ref 0.0–0.1)
Basophils Relative: 0.4 % (ref 0.0–3.0)
Eosinophils Absolute: 0.1 10*3/uL (ref 0.0–0.7)
Eosinophils Relative: 1.1 % (ref 0.0–5.0)
HCT: 52.2 % — ABNORMAL HIGH (ref 39.0–52.0)
Hemoglobin: 17.3 g/dL — ABNORMAL HIGH (ref 13.0–17.0)
Lymphocytes Relative: 26.7 % (ref 12.0–46.0)
Lymphs Abs: 3.1 10*3/uL (ref 0.7–4.0)
MCHC: 33.1 g/dL (ref 30.0–36.0)
MCV: 94.7 fl (ref 78.0–100.0)
Monocytes Absolute: 0.9 10*3/uL (ref 0.1–1.0)
Monocytes Relative: 7.7 % (ref 3.0–12.0)
Neutro Abs: 7.4 10*3/uL (ref 1.4–7.7)
Neutrophils Relative %: 64.1 % (ref 43.0–77.0)
Platelets: 260 10*3/uL (ref 150.0–400.0)
RBC: 5.52 Mil/uL (ref 4.22–5.81)
RDW: 13.5 % (ref 11.5–15.5)
WBC: 11.5 10*3/uL — ABNORMAL HIGH (ref 4.0–10.5)

## 2014-06-25 LAB — HEPATIC FUNCTION PANEL
ALT: 12 U/L (ref 0–53)
AST: 16 U/L (ref 0–37)
Albumin: 4.3 g/dL (ref 3.5–5.2)
Alkaline Phosphatase: 83 U/L (ref 39–117)
Bilirubin, Direct: 0 mg/dL (ref 0.0–0.3)
Total Bilirubin: 0.5 mg/dL (ref 0.2–1.2)
Total Protein: 7.6 g/dL (ref 6.0–8.3)

## 2014-06-25 LAB — TSH: TSH: 10.88 u[IU]/mL — ABNORMAL HIGH (ref 0.35–4.50)

## 2014-06-25 NOTE — Progress Notes (Signed)
Carotid duplex performed 

## 2014-06-25 NOTE — Progress Notes (Signed)
   Subjective:    Patient ID: Guy Morris, male    DOB: Feb 04, 1951, 64 y.o.   MRN: [id number]  HPI  Symptoms began 1 week ago as severe dizziness upon arising from the bed. He did not have symptoms prior to standing related to turning in bed. The symptoms have persisted but vary in intensity. With the episodes he has some weakness in his arms as if there are "limp". He also has nausea with the symptoms. Closing his eyes does help. The symptoms were actually better on one day but have recurred  There was no cardiac or neurologic prodrome prior to the advance.  There is no associated upper respiratory tract infection symptoms. He does have occasional itchy, watery eyes  He states he can hear his heart in the left ear. Tinnitus has been an issue 10-15 years  His last TSH was 7.23 in November 2015. His LDL was 151.  He drinks a pot coffee a day. He  smokes 15 "small cigars" daily.  Review of Systems Denied were any change in heart rhythm or rate prior to the event. There was no associated chest pain or shortness of breath . Also specifically denied prior to the episode were headache, limb weakness, tingling, or numbness. No seizure activity noted. Frontal headache, facial pain , nasal purulence, dental pain, sore throat , otic pain or otic discharge denied. No fever , chills or sweats.    Objective:   Physical Exam  Pertinent or positive findings include: Facies are markedly weathered in appearance. He has pattern alopecia. Has a mustache and beard. He was very unsteady standing up from the chair. Rhomberg revealed slight past pointing on the right. With extraocular motion  there was some nystagmus which was unsustained He has decreased hearing bilaterally, worse on the left. The tuning fork  lateralizes to the left. A brisk left carotid bruit is noted Breath sounds are decreased. He has severe valgus deformity of the legs.  General appearance :adequately nourished; in no  distress. Eyes: No conjunctival inflammation or scleral icterus is present. Oral exam: There is mild oropharyngeal erythema ;no exudate noted.  Heart:  Slow rate and regular rhythm. S1 and S2 normal without gallop, murmur, click, rub or other extra sounds   Lungs:Chest clear to auscultation; no wheezes, rhonchi,rales ,or rubs present.No increased work of breathing.  Abdomen: bowel sounds normal, soft and non-tender without masses, organomegaly or hernias noted.  No guarding or rebound. Vascular : all pulses equal Skin:Warm & dry.  Intact without suspicious lesions or rashes ; no jaundice or tenting Lymphatic: No lymphadenopathy is noted about the head, neck, axilla        Assessment & Plan:  #1 dizziness, postural  #2 left carotid bruit  #3 abnormal tuning fork exam  #4 hypothyroidism  #5 dyslipidemia  Plan: See orders and recommendations

## 2014-06-29 ENCOUNTER — Ambulatory Visit (INDEPENDENT_AMBULATORY_CARE_PROVIDER_SITE_OTHER): Payer: Medicare Other | Admitting: Internal Medicine

## 2014-06-29 ENCOUNTER — Encounter: Payer: Self-pay | Admitting: Internal Medicine

## 2014-06-29 VITALS — BP 138/78 | HR 62 | Ht 67.0 in | Wt 167.0 lb

## 2014-06-29 DIAGNOSIS — R42 Dizziness and giddiness: Secondary | ICD-10-CM | POA: Diagnosis not present

## 2014-06-29 DIAGNOSIS — E785 Hyperlipidemia, unspecified: Secondary | ICD-10-CM | POA: Diagnosis not present

## 2014-06-29 DIAGNOSIS — E038 Other specified hypothyroidism: Secondary | ICD-10-CM

## 2014-06-29 DIAGNOSIS — H539 Unspecified visual disturbance: Secondary | ICD-10-CM | POA: Insufficient documentation

## 2014-06-29 LAB — NMR LIPOPROFILE WITH LIPIDS
Cholesterol, Total: 245 mg/dL — ABNORMAL HIGH (ref 100–199)
HDL Particle Number: 29 umol/L — ABNORMAL LOW (ref >=30.5)
HDL Size: 8.6 nm — ABNORMAL LOW (ref >=9.2)
HDL-C: 45 mg/dL (ref >39)
LDL (calc): 174 mg/dL — ABNORMAL HIGH (ref 0–99)
LDL Particle Number: 2192 nmol/L — ABNORMAL HIGH (ref <1000)
LDL Size: 20.5 nm (ref >=20.8)
LP-IR Score: 56 — ABNORMAL HIGH (ref <=45)
Large HDL-P: 3.1 umol/L — ABNORMAL LOW (ref >=4.8)
Large VLDL-P: 0.9 nmol/L (ref <=2.7)
Small LDL Particle Number: 1090 nmol/L — ABNORMAL HIGH (ref <=527)
Triglycerides: 132 mg/dL (ref 0–149)
VLDL Size: 46.4 nm (ref <=46.6)

## 2014-06-29 NOTE — Patient Instructions (Signed)
The Ophthalmology & ECHO (heart valve study) referrals will be scheduled and you'll be notified of the time.Please call the Referral Co-Ordinator @ [phone] if you have not been notified of appointment time within 7-10 days.

## 2014-06-29 NOTE — Progress Notes (Signed)
   Subjective:    Patient ID: Guy Morris, male    DOB: June 15, 1950, 64 y.o.   MRN: [id number]  HPI  The dizziness has essentially resolved. He only took meclizine for 2 doses.  He continues to have some visual dysfunction. He states that when he focuses on items such as a tree it seems to move. He described the hanger on the back of the door as "rocking back-and-forth".  He did have a episode of dizziness yesterday 1/18 which lasted 30 seconds. This occurred when he bent forward.  He has decreased his small cigar intake from 10-11 a day down to 4 as of today  He is lost over 30 pounds with portion control and decreasing sweets.  His  Doppler and extensive labs were reviewed. TSH was 10.88. This is on Synthroid 125 g a day  His advanced cholesterol testing was also reviewed and discussed in detail  Review of Systems He denies associated headache, diplopia, or vision loss to  He has no numbness, tingling, weakness in extremities new  Has no fever, chills, or sweats      Objective:   Physical Exam  Positive or pertinent findings include: There is splitting of the first heart sound Pedal pulses are decreased but equal. He has marked valgus deformity of the lower extremities. Rhomberg and finger-nose testing are now normal.  General appearance :adequately nourished; in no distress. Eyes: No conjunctival inflammation or scleral icterus is present.EOMI Heart:  Normal rate and regular rhythm.  S2 normal without gallop, murmur, click, rub or other extra sounds   Lungs:Chest clear to auscultation; no wheezes, rhonchi,rales ,or rubs present.No increased work of breathing.  Abdomen: bowel sounds normal, soft and non-tender without masses, organomegaly or hernias noted.  No guarding or rebound.  Vascular : all pulses equal ;L carotid bruit. Skin:Warm & dry.  Intact without suspicious lesions or rashes ; no jaundice or tenting Lymphatic: No lymphadenopathy is noted about the head,  neck, axilla,          Assessment & Plan:  #1 dizziness, essentially resolved  #2 visual dysfunction in the context of significant carotid disease   #3See Current Assessment & Plan in Problem List under specific Diagnosis

## 2014-06-29 NOTE — Assessment & Plan Note (Signed)
Ophth referral 2 D ECHO

## 2014-06-29 NOTE — Assessment & Plan Note (Addendum)
Statin intolerant Recheck TSH after TSH therapeutic 3 mos

## 2014-06-29 NOTE — Assessment & Plan Note (Signed)
Thyroid increase to 125 mcg qd except 1&1/2 on T & Th TSH in 10 weeks

## 2014-06-29 NOTE — Progress Notes (Signed)
Pre visit review using our clinic review tool, if applicable. No additional management support is needed unless otherwise documented below in the visit note. 

## 2014-07-05 ENCOUNTER — Ambulatory Visit (HOSPITAL_COMMUNITY)
Admission: RE | Admit: 2014-07-05 | Discharge: 2014-07-05 | Disposition: A | Discharge status: Home / Self Care | Payer: Medicare Other | Source: Ambulatory Visit | Attending: Internal Medicine | Admitting: Internal Medicine

## 2014-07-05 DIAGNOSIS — E785 Hyperlipidemia, unspecified: Secondary | ICD-10-CM

## 2014-07-05 DIAGNOSIS — R42 Dizziness and giddiness: Secondary | ICD-10-CM | POA: Diagnosis present

## 2014-07-05 DIAGNOSIS — Z79899 Other long term (current) drug therapy: Secondary | ICD-10-CM | POA: Diagnosis not present

## 2014-07-05 DIAGNOSIS — Z720 Tobacco use: Secondary | ICD-10-CM | POA: Insufficient documentation

## 2014-07-05 DIAGNOSIS — H539 Unspecified visual disturbance: Secondary | ICD-10-CM

## 2014-07-05 NOTE — Progress Notes (Signed)
  Echocardiogram 2D Echocardiogram has been performed.  [place], [place] 07/05/2014, 10:40 AM

## 2014-07-21 ENCOUNTER — Ambulatory Visit (INDEPENDENT_AMBULATORY_CARE_PROVIDER_SITE_OTHER)
Admission: RE | Admit: 2014-07-21 | Discharge: 2014-07-21 | Disposition: A | Discharge status: Home / Self Care | Payer: Medicare Other | Source: Ambulatory Visit | Attending: Internal Medicine | Admitting: Internal Medicine

## 2014-07-21 DIAGNOSIS — J439 Emphysema, unspecified: Secondary | ICD-10-CM | POA: Diagnosis not present

## 2014-07-21 DIAGNOSIS — R911 Solitary pulmonary nodule: Secondary | ICD-10-CM

## 2014-07-21 DIAGNOSIS — J929 Pleural plaque without asbestos: Secondary | ICD-10-CM | POA: Diagnosis not present

## 2014-07-23 ENCOUNTER — Ambulatory Visit (INDEPENDENT_AMBULATORY_CARE_PROVIDER_SITE_OTHER): Payer: Medicare Other | Admitting: Internal Medicine

## 2014-07-23 ENCOUNTER — Encounter: Payer: Self-pay | Admitting: Internal Medicine

## 2014-07-23 VITALS — BP 140/80 | HR 49 | Temp 97.8°F | Ht 67.0 in | Wt 163.5 lb

## 2014-07-23 DIAGNOSIS — J61 Pneumoconiosis due to asbestos and other mineral fibers: Secondary | ICD-10-CM

## 2014-07-23 DIAGNOSIS — R42 Dizziness and giddiness: Secondary | ICD-10-CM | POA: Diagnosis not present

## 2014-07-23 DIAGNOSIS — E785 Hyperlipidemia, unspecified: Secondary | ICD-10-CM

## 2014-07-23 NOTE — Assessment & Plan Note (Signed)
Perform isometric exercise of calves  ( while seated go up on toes to count of 5 & then onto heels for 5 count). Repeat  4- 5 times prior to standing if you've been seated or supine for any significant period of time as BP drops with such positions.  

## 2014-07-23 NOTE — Progress Notes (Signed)
   Subjective:    Patient ID: Guy Morris, male    DOB: Aug 16, 1950, 64 y.o.   MRN: [id number]  HPI  He is here to review his CT scan which suggests asbestosis. No nodule was present. He has no active cardio pulmonary symptoms at this time.  His past occupational exposures to asbestos were reviewed.  He has cut back from 10 small cigars a day to none in the last 24 hours.  He did have an episode of slight dizziness when he sit up rapidly from the chair yesterday.  He's also concerned about intermittent swelling in the left inguinal area.  NMR results discussed & risk assessed (increased almost 75%). MI in P aunt @ 38 & 37 M uncles > 79. He has been statin intolerant.  Review of Systems No cough or hemoptysis. Chest pain, palpitations, tachycardia, exertional dyspnea, paroxysmal nocturnal dyspnea, claudication or edema are absent.  Fever, chills, sweats, or unexplained weight loss not present. No significant headaches. Mental status change or memory loss denied. Blurred vision , diplopia or vision loss absent. Vertigo, near syncope or imbalance denied. There is no numbness, tingling, or weakness in extremities.   No loss of control of bladder or bowels. Radicular type pain absent. No seizure stigmata.    Objective:   Physical Exam  Pertinent or positive findings include: Brisk left carotid bruit Slight fullness left inguinal area w/o tenderness or definite hernia.Left intrascrotal varices. Valgus deformity of the lower extremities Decreased pedal pulses  General appearance :adequately nourished; in no distress. Eyes: No conjunctival inflammation or scleral icterus is present. Heart:  Normal rate and regular rhythm. S1 and S2 normal without gallop, murmur, click, rub or other extra sounds   Lungs:Chest clear to auscultation; no wheezes, rhonchi,rales ,or rubs present.No increased work of breathing.  Abdomen: bowel sounds normal, soft and non-tender without masses,  organomegaly or hernias noted.  No guarding or rebound. No AAA Vascular : all pulses equal ; no bruits present. Skin:Warm & dry.  Intact without suspicious lesions or rashes ; no jaundice or tenting Lymphatic: No lymphadenopathy is noted about the head, neck, axilla, or inguinal areas.  Neuro: Strength, tone  normal.     Assessment & Plan:  #1 See Current Assessment & Plan in Problem List under specific Diagnosis #2 L inguinal hernia suggested; reducible. Surgical referral if symptoms progress

## 2014-07-23 NOTE — Assessment & Plan Note (Signed)
Annual CXR

## 2014-07-23 NOTE — Assessment & Plan Note (Signed)
NMR reviewed & risks discussed

## 2014-07-23 NOTE — Patient Instructions (Addendum)
Perform isometric exercise of calves  ( while seated go up on toes to count of 5 & then onto heels for 5 count). Repeat  4- 5 times prior to standing if you've been seated or supine for any significant period of time as BP drops with such positions.  Neurology referral if symptoms persist or progress

## 2014-07-23 NOTE — Progress Notes (Signed)
Pre visit review using our clinic review tool, if applicable. No additional management support is needed unless otherwise documented below in the visit note. 

## 2014-08-24 ENCOUNTER — Encounter: Payer: Self-pay | Admitting: Internal Medicine

## 2014-08-24 ENCOUNTER — Other Ambulatory Visit (INDEPENDENT_AMBULATORY_CARE_PROVIDER_SITE_OTHER): Payer: Medicare Other

## 2014-08-24 ENCOUNTER — Ambulatory Visit (INDEPENDENT_AMBULATORY_CARE_PROVIDER_SITE_OTHER): Payer: Medicare Other | Admitting: Internal Medicine

## 2014-08-24 VITALS — BP 110/72 | HR 53 | Temp 97.8°F | Resp 18 | Ht 67.0 in | Wt 167.0 lb

## 2014-08-24 DIAGNOSIS — D751 Secondary polycythemia: Secondary | ICD-10-CM

## 2014-08-24 DIAGNOSIS — K4090 Unilateral inguinal hernia, without obstruction or gangrene, not specified as recurrent: Secondary | ICD-10-CM | POA: Diagnosis not present

## 2014-08-24 DIAGNOSIS — E785 Hyperlipidemia, unspecified: Secondary | ICD-10-CM

## 2014-08-24 DIAGNOSIS — N320 Bladder-neck obstruction: Secondary | ICD-10-CM | POA: Diagnosis not present

## 2014-08-24 DIAGNOSIS — E039 Hypothyroidism, unspecified: Secondary | ICD-10-CM

## 2014-08-24 LAB — CBC WITH DIFFERENTIAL/PLATELET
Basophils Absolute: 0 10*3/uL (ref 0.0–0.1)
Basophils Relative: 0.5 % (ref 0.0–3.0)
Eosinophils Absolute: 0.2 10*3/uL (ref 0.0–0.7)
Eosinophils Relative: 2.6 % (ref 0.0–5.0)
HCT: 46.7 % (ref 39.0–52.0)
Hemoglobin: 16 g/dL (ref 13.0–17.0)
Lymphocytes Relative: 30.1 % (ref 12.0–46.0)
Lymphs Abs: 2.7 10*3/uL (ref 0.7–4.0)
MCHC: 34.3 g/dL (ref 30.0–36.0)
MCV: 91.1 fl (ref 78.0–100.0)
Monocytes Absolute: 0.7 10*3/uL (ref 0.1–1.0)
Monocytes Relative: 8.1 % (ref 3.0–12.0)
Neutro Abs: 5.3 10*3/uL (ref 1.4–7.7)
Neutrophils Relative %: 58.7 % (ref 43.0–77.0)
Platelets: 257 10*3/uL (ref 150.0–400.0)
RBC: 5.12 Mil/uL (ref 4.22–5.81)
RDW: 12.9 % (ref 11.5–15.5)
WBC: 9.1 10*3/uL (ref 4.0–10.5)

## 2014-08-24 LAB — LDL CHOLESTEROL, DIRECT: Direct LDL: 167 mg/dL

## 2014-08-24 LAB — LIPID PANEL
Cholesterol: 234 mg/dL — ABNORMAL HIGH (ref 0–200)
HDL: 44.5 mg/dL (ref >39.00)
NonHDL: 189.5
Total CHOL/HDL Ratio: 5
Triglycerides: 210 mg/dL — ABNORMAL HIGH (ref 0.0–149.0)
VLDL: 42 mg/dL — ABNORMAL HIGH (ref 0.0–40.0)

## 2014-08-24 LAB — T4, FREE: Free T4: 1.01 ng/dL (ref 0.60–1.60)

## 2014-08-24 LAB — PSA: PSA: 1.01 ng/mL (ref 0.10–4.00)

## 2014-08-24 LAB — TSH: TSH: 9.1 u[IU]/mL — ABNORMAL HIGH (ref 0.35–4.50)

## 2014-08-24 NOTE — Assessment & Plan Note (Signed)
stable overall by history and exam, recent data reviewed with pt, and pt to continue medical treatment as before,  to f/u any worsening symptoms or concerns Lab Results  Component Value Date   LDLCALC 174* 06/25/2014

## 2014-08-24 NOTE — Assessment & Plan Note (Signed)
stable overall by history and exam, recent data reviewed with pt, and pt to continue medical treatment as before,  to f/u any worsening symptoms or concerns Lab Results  Component Value Date   WBC 11.5* 06/25/2014   HGB 17.3* 06/25/2014   HCT 52.2* 06/25/2014   MCV 94.7 06/25/2014   PLT 260.0 06/25/2014   For f/u cbc

## 2014-08-24 NOTE — Assessment & Plan Note (Signed)
stable overall by history and exam, recent data reviewed with pt, and pt to continue medical treatment as before,  to f/u any worsening symptoms or concern  For f/u labs Lab Results  Component Value Date   TSH 10.88* 06/25/2014

## 2014-08-24 NOTE — Progress Notes (Signed)
Pre visit review using our clinic review tool, if applicable. No additional management support is needed unless otherwise documented below in the visit note. 

## 2014-08-24 NOTE — Assessment & Plan Note (Signed)
Ok for referral gen surgury 

## 2014-08-24 NOTE — Patient Instructions (Signed)
Please continue all other medications as before, and refills have been done if requested.  Please have the pharmacy call with any other refills you may need.  Please continue your efforts at being more active, low cholesterol diet, and weight control.  You are otherwise up to date with prevention measures today.  Please keep your appointments with your specialists as you may have planned  You will be contacted regarding the referral for: General Surgury  Please go to the LAB in the Basement (turn left off the elevator) for the tests to be done today  You will be contacted by phone if any changes need to be made immediately.  Otherwise, you will receive a letter about your results with an explanation, but please check with MyChart first.  Please remember to sign up for MyChart if you have not done so, as this will be important to you in the future with finding out test results, communicating by private email, and scheduling acute appointments online when needed.  Please return in 6 months, or sooner if needed

## 2014-08-24 NOTE — Assessment & Plan Note (Signed)
Also for psa as he is due, asympt
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
## 2014-08-25 ENCOUNTER — Other Ambulatory Visit: Payer: Self-pay | Admitting: Internal Medicine

## 2014-08-25 ENCOUNTER — Encounter: Payer: Self-pay | Admitting: Internal Medicine

## 2014-08-25 MED ORDER — LEVOTHYROXINE SODIUM 137 MCG PO TABS: 137.0000 ug | ORAL_TABLET | Freq: Every day | ORAL | Status: DC

## 2014-09-08 DIAGNOSIS — K4090 Unilateral inguinal hernia, without obstruction or gangrene, not specified as recurrent: Secondary | ICD-10-CM | POA: Diagnosis not present

## 2014-10-07 DIAGNOSIS — K4090 Unilateral inguinal hernia, without obstruction or gangrene, not specified as recurrent: Secondary | ICD-10-CM | POA: Diagnosis not present

## 2014-10-12 DIAGNOSIS — M25461 Effusion, right knee: Secondary | ICD-10-CM | POA: Diagnosis not present

## 2014-10-12 DIAGNOSIS — M1711 Unilateral primary osteoarthritis, right knee: Secondary | ICD-10-CM | POA: Diagnosis not present

## 2014-10-12 DIAGNOSIS — M25561 Pain in right knee: Secondary | ICD-10-CM | POA: Diagnosis not present

## 2014-10-13 ENCOUNTER — Encounter: Payer: Self-pay | Admitting: Gastroenterology

## 2014-10-14 DIAGNOSIS — M1711 Unilateral primary osteoarthritis, right knee: Secondary | ICD-10-CM | POA: Diagnosis not present

## 2014-10-28 ENCOUNTER — Ambulatory Visit (INDEPENDENT_AMBULATORY_CARE_PROVIDER_SITE_OTHER): Payer: Medicare Other | Admitting: Internal Medicine

## 2014-10-28 ENCOUNTER — Other Ambulatory Visit: Payer: Self-pay | Admitting: Internal Medicine

## 2014-10-28 ENCOUNTER — Encounter: Payer: Self-pay | Admitting: Internal Medicine

## 2014-10-28 ENCOUNTER — Other Ambulatory Visit (INDEPENDENT_AMBULATORY_CARE_PROVIDER_SITE_OTHER): Payer: Medicare Other

## 2014-10-28 VITALS — BP 120/82 | HR 56 | Temp 98.0°F | Wt 166.1 lb

## 2014-10-28 DIAGNOSIS — E785 Hyperlipidemia, unspecified: Secondary | ICD-10-CM

## 2014-10-28 DIAGNOSIS — E039 Hypothyroidism, unspecified: Secondary | ICD-10-CM

## 2014-10-28 DIAGNOSIS — J309 Allergic rhinitis, unspecified: Secondary | ICD-10-CM

## 2014-10-28 LAB — TSH: TSH: 12.41 u[IU]/mL — ABNORMAL HIGH (ref 0.35–4.50)

## 2014-10-28 LAB — T4, FREE: Free T4: 0.92 ng/dL (ref 0.60–1.60)

## 2014-10-28 MED ORDER — LEVOTHYROXINE SODIUM 175 MCG PO TABS: 175.0000 ug | ORAL_TABLET | Freq: Every day | ORAL | Status: DC

## 2014-10-28 NOTE — Assessment & Plan Note (Signed)
stable overall by history and exam, and pt to continue medical treatment as before,  to f/u any worsening symptoms or concerns, ok to also add otc nasacort as needed

## 2014-10-28 NOTE — Patient Instructions (Signed)
Please continue all other medications as before, and refills have been done if requested.  Please have the pharmacy call with any other refills you may need.  Please continue your efforts at being more active, low cholesterol diet, and weight control.  You are otherwise up to date with prevention measures today.  Please keep your appointments with your specialists as you may have planned  Please go to the LAB in the Basement (turn left off the elevator) for the tests to be done today  You will be contacted by phone if any changes need to be made immediately.  Otherwise, you will receive a letter about your results with an explanation, but please check with MyChart first.  Please remember to sign up for MyChart if you have not done so, as this will be important to you in the future with finding out test results, communicating by private email, and scheduling acute appointments online when needed.  Please return in 6 months, or sooner if needed  Good Luck with your knee surgury in July.

## 2014-10-28 NOTE — Assessment & Plan Note (Signed)
stable overall by history and exam, recent data reviewed with pt, and pt to continue medical treatment as before,  to f/u any worsening symptoms or concerns Lab Results  Component Value Date   TSH 9.10* 08/24/2014   For f/u lab today

## 2014-10-28 NOTE — Assessment & Plan Note (Signed)
stable overall by history and exam, recent data reviewed with pt, and pt to continue medical treatment as before,  to f/u any worsening symptoms or concerns, statin intolerant - delcines trial of other statin even qod  Lab Results  Component Value Date   LDLCALC 174* 06/25/2014

## 2014-10-28 NOTE — Progress Notes (Signed)
Pre visit review using our clinic review tool, if applicable. No additional management support is needed unless otherwise documented below in the visit note. 
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
## 2014-11-30 DIAGNOSIS — M1711 Unilateral primary osteoarthritis, right knee: Secondary | ICD-10-CM | POA: Diagnosis not present

## 2014-12-01 ENCOUNTER — Ambulatory Visit: Payer: Self-pay | Admitting: Physician Assistant
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
## 2014-12-03 NOTE — Pre-Procedure Instructions (Signed)
Guy Morris  12/03/2014      [facility] - [place], [place] - [address] [place] [address] Phone: [phone] Fax: [phone]    Your procedure is scheduled on : July 8th, Friday.   Report to [facility] Admitting at 5:30 AM  Call this number if you have problems the morning of surgery:  [phone]   Remember:  Do not eat food or drink liquids after midnight Thursday.  Take these medicines the morning of surgery with A SIP OF WATER : Hydrocodone, Levothyroxine, Claritin (if needed)   Do not wear jewelry - no rings or watches.  Do not wear lotions or colognes.   You may NOT  wear deodorant the day of surgery.             Men may shave face and neck.   Do not bring valuables to the hospital.  [facility] is not responsible for any belongings or valuables.  Contacts, dentures or bridgework may not be worn into surgery.  Leave your suitcase in the car.  After surgery it may be brought to your room. For patients admitted to the hospital, discharge time will be determined by your treatment team.  Name and phone number of your driver:     Special instructions:  "Preparing for Surgery" instruction sheet.  Please read over the following fact sheets that you were given. Pain Booklet, Coughing and Deep Breathing, Blood Transfusion Information, MRSA Information and Surgical Site Infection Prevention

## 2014-12-06 ENCOUNTER — Encounter (HOSPITAL_COMMUNITY): Payer: Self-pay

## 2014-12-06 ENCOUNTER — Encounter (HOSPITAL_COMMUNITY)
Admission: RE | Admit: 2014-12-06 | Discharge: 2014-12-06 | Disposition: A | Discharge status: Home / Self Care | Payer: Medicare Other | Source: Ambulatory Visit | Attending: Orthopedic Surgery | Admitting: Orthopedic Surgery

## 2014-12-06 DIAGNOSIS — Z01812 Encounter for preprocedural laboratory examination: Secondary | ICD-10-CM | POA: Insufficient documentation

## 2014-12-06 DIAGNOSIS — R001 Bradycardia, unspecified: Secondary | ICD-10-CM | POA: Diagnosis not present

## 2014-12-06 DIAGNOSIS — Z0183 Encounter for blood typing: Secondary | ICD-10-CM | POA: Diagnosis not present

## 2014-12-06 DIAGNOSIS — M179 Osteoarthritis of knee, unspecified: Secondary | ICD-10-CM | POA: Insufficient documentation

## 2014-12-06 HISTORY — DX: Chronic obstructive pulmonary disease, unspecified: J44.9

## 2014-12-06 LAB — CBC WITH DIFFERENTIAL/PLATELET
Basophils Absolute: 0 10*3/uL (ref 0.0–0.1)
Basophils Relative: 0 % (ref 0–1)
Eosinophils Absolute: 0.1 10*3/uL (ref 0.0–0.7)
Eosinophils Relative: 2 % (ref 0–5)
HCT: 46.2 % (ref 39.0–52.0)
Hemoglobin: 15.8 g/dL (ref 13.0–17.0)
Lymphocytes Relative: 31 % (ref 12–46)
Lymphs Abs: 2.7 10*3/uL (ref 0.7–4.0)
MCH: 31.7 pg (ref 26.0–34.0)
MCHC: 34.2 g/dL (ref 30.0–36.0)
MCV: 92.8 fL (ref 78.0–100.0)
Monocytes Absolute: 0.7 10*3/uL (ref 0.1–1.0)
Monocytes Relative: 8 % (ref 3–12)
Neutro Abs: 5.1 10*3/uL (ref 1.7–7.7)
Neutrophils Relative %: 59 % (ref 43–77)
Platelets: 263 10*3/uL (ref 150–400)
RBC: 4.98 MIL/uL (ref 4.22–5.81)
RDW: 12.9 % (ref 11.5–15.5)
WBC: 8.6 10*3/uL (ref 4.0–10.5)

## 2014-12-06 LAB — COMPREHENSIVE METABOLIC PANEL
ALT: 16 U/L — ABNORMAL LOW (ref 17–63)
AST: 19 U/L (ref 15–41)
Albumin: 3.9 g/dL (ref 3.5–5.0)
Alkaline Phosphatase: 89 U/L (ref 38–126)
Anion gap: 8 (ref 5–15)
BUN: 10 mg/dL (ref 6–20)
CO2: 27 mmol/L (ref 22–32)
Calcium: 9.5 mg/dL (ref 8.9–10.3)
Chloride: 101 mmol/L (ref 101–111)
Creatinine, Ser: 0.78 mg/dL (ref 0.61–1.24)
GFR calc Af Amer: >60 mL/min (ref >60)
GFR calc non Af Amer: >60 mL/min (ref >60)
Glucose, Bld: 100 mg/dL — ABNORMAL HIGH (ref 65–99)
Potassium: 4.3 mmol/L (ref 3.5–5.1)
Sodium: 136 mmol/L (ref 135–145)
Total Bilirubin: 0.6 mg/dL (ref 0.3–1.2)
Total Protein: 7.4 g/dL (ref 6.5–8.1)

## 2014-12-06 LAB — URINALYSIS, ROUTINE W REFLEX MICROSCOPIC
Bilirubin Urine: NEGATIVE
Glucose, UA: NEGATIVE mg/dL
Ketones, ur: NEGATIVE mg/dL
Leukocytes, UA: NEGATIVE
Nitrite: NEGATIVE
Protein, ur: NEGATIVE mg/dL
Specific Gravity, Urine: 1.011 (ref 1.005–1.030)
Urobilinogen, UA: 0.2 mg/dL (ref 0.0–1.0)
pH: 5 (ref 5.0–8.0)

## 2014-12-06 LAB — PROTIME-INR
INR: 1.06 (ref 0.00–1.49)
Prothrombin Time: 14 s (ref 11.6–15.2)

## 2014-12-06 LAB — URINE MICROSCOPIC-ADD ON

## 2014-12-06 LAB — TYPE AND SCREEN
ABO/RH(D): A POS
Antibody Screen: NEGATIVE

## 2014-12-06 LAB — APTT: aPTT: 30 seconds (ref 24–37)

## 2014-12-06 LAB — SURGICAL PCR SCREEN
MRSA, PCR: NEGATIVE
Staphylococcus aureus: NEGATIVE

## 2014-12-06 LAB — ABO/RH: ABO/RH(D): A POS

## 2014-12-07 LAB — URINE CULTURE: Culture: NO GROWTH

## 2014-12-15 DIAGNOSIS — M13861 Other specified arthritis, right knee: Secondary | ICD-10-CM | POA: Diagnosis not present

## 2014-12-15 DIAGNOSIS — M25562 Pain in left knee: Secondary | ICD-10-CM | POA: Diagnosis not present

## 2014-12-16 MED ORDER — TRANEXAMIC ACID 1000 MG/10ML IV SOLN
1000.0000 mg | INTRAVENOUS | Status: AC
  Administered 2014-12-17: 1000 mg via INTRAVENOUS
  Filled 2014-12-16: qty 10

## 2014-12-17 ENCOUNTER — Inpatient Hospital Stay (HOSPITAL_COMMUNITY): Payer: Medicare Other | Admitting: Certified Registered Nurse Anesthetist

## 2014-12-17 ENCOUNTER — Inpatient Hospital Stay (HOSPITAL_COMMUNITY)
Admission: RE | Admit: 2014-12-17 | Discharge: 2014-12-19 | DRG: 470 | Disposition: A | Discharge status: Home / Self Care | Payer: Medicare Other | Source: Ambulatory Visit | Attending: Orthopedic Surgery | Admitting: Orthopedic Surgery

## 2014-12-17 ENCOUNTER — Encounter (HOSPITAL_COMMUNITY)
Admission: RE | Disposition: A | Discharge status: Home / Self Care | Payer: Self-pay | Source: Ambulatory Visit | Attending: Orthopedic Surgery

## 2014-12-17 ENCOUNTER — Encounter (HOSPITAL_COMMUNITY): Payer: Self-pay | Admitting: *Deleted

## 2014-12-17 DIAGNOSIS — M1711 Unilateral primary osteoarthritis, right knee: Principal | ICD-10-CM | POA: Diagnosis present

## 2014-12-17 DIAGNOSIS — E039 Hypothyroidism, unspecified: Secondary | ICD-10-CM | POA: Diagnosis not present

## 2014-12-17 DIAGNOSIS — Z87891 Personal history of nicotine dependence: Secondary | ICD-10-CM | POA: Diagnosis not present

## 2014-12-17 DIAGNOSIS — R110 Nausea: Secondary | ICD-10-CM | POA: Diagnosis not present

## 2014-12-17 DIAGNOSIS — J449 Chronic obstructive pulmonary disease, unspecified: Secondary | ICD-10-CM | POA: Diagnosis present

## 2014-12-17 DIAGNOSIS — G8918 Other acute postprocedural pain: Secondary | ICD-10-CM | POA: Diagnosis not present

## 2014-12-17 DIAGNOSIS — K219 Gastro-esophageal reflux disease without esophagitis: Secondary | ICD-10-CM | POA: Diagnosis present

## 2014-12-17 DIAGNOSIS — M179 Osteoarthritis of knee, unspecified: Secondary | ICD-10-CM | POA: Diagnosis not present

## 2014-12-17 HISTORY — PX: TOTAL KNEE ARTHROPLASTY: SHX125

## 2014-12-17 LAB — GLUCOSE, CAPILLARY: Glucose-Capillary: 115 mg/dL — ABNORMAL HIGH (ref 65–99)

## 2014-12-17 SURGERY — ARTHROPLASTY, KNEE, TOTAL
Anesthesia: General | Site: Knee | Laterality: Right

## 2014-12-17 MED ORDER — METOCLOPRAMIDE HCL 5 MG/ML IJ SOLN
5.0000 mg | Freq: Three times a day (TID) | INTRAMUSCULAR | Status: DC | PRN
  Administered 2014-12-17 – 2014-12-18 (×3): 10 mg via INTRAVENOUS
  Filled 2014-12-17 (×3): qty 2

## 2014-12-17 MED ORDER — PROPOFOL 10 MG/ML IV BOLUS
INTRAVENOUS | Status: AC
  Filled 2014-12-17: qty 20

## 2014-12-17 MED ORDER — FENTANYL CITRATE (PF) 100 MCG/2ML IJ SOLN
INTRAMUSCULAR | Status: DC | PRN
Start: 2014-12-17 — End: 2014-12-17
  Administered 2014-12-17 (×2): 50 ug via INTRAVENOUS
  Administered 2014-12-17: 100 ug via INTRAVENOUS
  Administered 2014-12-17: 50 ug via INTRAVENOUS
  Administered 2014-12-17: 100 ug via INTRAVENOUS
  Administered 2014-12-17 (×4): 50 ug via INTRAVENOUS
  Administered 2014-12-17: 100 ug via INTRAVENOUS
  Administered 2014-12-17 (×2): 50 ug via INTRAVENOUS

## 2014-12-17 MED ORDER — LIDOCAINE HCL (CARDIAC) 20 MG/ML IV SOLN
INTRAVENOUS | Status: AC
  Filled 2014-12-17: qty 10

## 2014-12-17 MED ORDER — ROCURONIUM BROMIDE 100 MG/10ML IV SOLN
INTRAVENOUS | Status: DC | PRN
  Administered 2014-12-17: 40 mg via INTRAVENOUS

## 2014-12-17 MED ORDER — ONDANSETRON HCL 4 MG PO TABS: 4.0000 mg | ORAL_TABLET | Freq: Four times a day (QID) | ORAL | Status: DC | PRN

## 2014-12-17 MED ORDER — LIDOCAINE HCL 4 % MT SOLN
OROMUCOSAL | Status: DC | PRN
  Administered 2014-12-17: 4 mL via TOPICAL

## 2014-12-17 MED ORDER — FLEET ENEMA 7-19 GM/118ML RE ENEM: 1.0000 | ENEMA | Freq: Once | RECTAL | Status: AC | PRN

## 2014-12-17 MED ORDER — CHLORHEXIDINE GLUCONATE 4 % EX LIQD: 60.0000 mL | Freq: Once | CUTANEOUS | Status: DC

## 2014-12-17 MED ORDER — CEFAZOLIN SODIUM-DEXTROSE 2-3 GM-% IV SOLR
2.0000 g | INTRAVENOUS | Status: AC
  Administered 2014-12-17: 2 g via INTRAVENOUS

## 2014-12-17 MED ORDER — POLYETHYLENE GLYCOL 3350 17 G PO PACK: 17.0000 g | PACK | Freq: Every day | ORAL | Status: DC | PRN

## 2014-12-17 MED ORDER — LEVOTHYROXINE SODIUM 175 MCG PO TABS
175.0000 ug | ORAL_TABLET | Freq: Every day | ORAL | Status: DC
  Administered 2014-12-18 – 2014-12-19 (×2): 175 ug via ORAL
  Filled 2014-12-17 (×3): qty 1

## 2014-12-17 MED ORDER — CEFAZOLIN SODIUM-DEXTROSE 2-3 GM-% IV SOLR
INTRAVENOUS | Status: AC
  Filled 2014-12-17: qty 50

## 2014-12-17 MED ORDER — ONDANSETRON HCL 4 MG/2ML IJ SOLN
INTRAMUSCULAR | Status: AC
  Filled 2014-12-17: qty 2

## 2014-12-17 MED ORDER — ACETAMINOPHEN 325 MG PO TABS
650.0000 mg | ORAL_TABLET | Freq: Four times a day (QID) | ORAL | Status: DC | PRN
Start: 2014-12-17 — End: 2014-12-19
  Administered 2014-12-18: 650 mg via ORAL
  Filled 2014-12-17: qty 2

## 2014-12-17 MED ORDER — ONDANSETRON HCL 4 MG/2ML IJ SOLN
4.0000 mg | Freq: Four times a day (QID) | INTRAMUSCULAR | Status: DC | PRN
  Administered 2014-12-17 – 2014-12-18 (×3): 4 mg via INTRAVENOUS
  Filled 2014-12-17 (×3): qty 2

## 2014-12-17 MED ORDER — FENTANYL CITRATE (PF) 250 MCG/5ML IJ SOLN
INTRAMUSCULAR | Status: AC
  Filled 2014-12-17: qty 5

## 2014-12-17 MED ORDER — ONDANSETRON HCL 4 MG/2ML IJ SOLN
INTRAMUSCULAR | Status: DC | PRN
  Administered 2014-12-17: 4 mg via INTRAVENOUS

## 2014-12-17 MED ORDER — SODIUM CHLORIDE 0.9 % IR SOLN
Status: DC | PRN
  Administered 2014-12-17: 1000 mL

## 2014-12-17 MED ORDER — OXYCODONE HCL 5 MG PO TABS
ORAL_TABLET | ORAL | Status: AC
  Filled 2014-12-17: qty 1

## 2014-12-17 MED ORDER — APIXABAN 2.5 MG PO TABS
2.5000 mg | ORAL_TABLET | Freq: Two times a day (BID) | ORAL | Status: DC
  Administered 2014-12-18 – 2014-12-19 (×3): 2.5 mg via ORAL
  Filled 2014-12-17 (×3): qty 1

## 2014-12-17 MED ORDER — HYDROMORPHONE HCL 1 MG/ML IJ SOLN
0.5000 mg | INTRAMUSCULAR | Status: DC | PRN
  Administered 2014-12-17 (×4): 0.5 mg via INTRAVENOUS

## 2014-12-17 MED ORDER — BISACODYL 5 MG PO TBEC: 5.0000 mg | DELAYED_RELEASE_TABLET | Freq: Every day | ORAL | Status: DC | PRN

## 2014-12-17 MED ORDER — SODIUM CHLORIDE 0.9 % IJ SOLN
INTRAMUSCULAR | Status: AC
  Filled 2014-12-17: qty 10

## 2014-12-17 MED ORDER — PHENYLEPHRINE 40 MCG/ML (10ML) SYRINGE FOR IV PUSH (FOR BLOOD PRESSURE SUPPORT)
PREFILLED_SYRINGE | INTRAVENOUS | Status: AC
  Filled 2014-12-17: qty 10

## 2014-12-17 MED ORDER — DOCUSATE SODIUM 100 MG PO CAPS
100.0000 mg | ORAL_CAPSULE | Freq: Two times a day (BID) | ORAL | Status: DC
  Administered 2014-12-17 – 2014-12-19 (×5): 100 mg via ORAL
  Filled 2014-12-17 (×6): qty 1

## 2014-12-17 MED ORDER — HYDROMORPHONE HCL 1 MG/ML IJ SOLN
INTRAMUSCULAR | Status: AC
  Administered 2014-12-17: 0.5 mg via INTRAVENOUS
  Filled 2014-12-17: qty 1

## 2014-12-17 MED ORDER — NEOSTIGMINE METHYLSULFATE 10 MG/10ML IV SOLN
INTRAVENOUS | Status: DC | PRN
  Administered 2014-12-17: 3 mg via INTRAVENOUS

## 2014-12-17 MED ORDER — GLYCOPYRROLATE 0.2 MG/ML IJ SOLN
INTRAMUSCULAR | Status: DC | PRN
  Administered 2014-12-17: .4 mg via INTRAVENOUS

## 2014-12-17 MED ORDER — ONDANSETRON HCL 4 MG/2ML IJ SOLN: 4.0000 mg | Freq: Once | INTRAMUSCULAR | Status: DC | PRN

## 2014-12-17 MED ORDER — HYDROMORPHONE HCL 1 MG/ML IJ SOLN
INTRAMUSCULAR | Status: AC
  Filled 2014-12-17: qty 1

## 2014-12-17 MED ORDER — OXYCODONE HCL 5 MG PO TABS
5.0000 mg | ORAL_TABLET | ORAL | Status: DC | PRN
  Administered 2014-12-17 (×2): 10 mg via ORAL
  Administered 2014-12-17: 5 mg via ORAL
  Administered 2014-12-17 – 2014-12-18 (×6): 10 mg via ORAL
  Administered 2014-12-18: 5 mg via ORAL
  Administered 2014-12-19 (×3): 10 mg via ORAL
  Filled 2014-12-17 (×13): qty 2

## 2014-12-17 MED ORDER — METOCLOPRAMIDE HCL 5 MG PO TABS
5.0000 mg | ORAL_TABLET | Freq: Three times a day (TID) | ORAL | Status: DC | PRN
  Filled 2014-12-17: qty 2

## 2014-12-17 MED ORDER — LIDOCAINE HCL (CARDIAC) 20 MG/ML IV SOLN
INTRAVENOUS | Status: DC | PRN
  Administered 2014-12-17: 60 mg via INTRAVENOUS

## 2014-12-17 MED ORDER — SODIUM CHLORIDE 0.9 % IV SOLN: INTRAVENOUS | Status: DC

## 2014-12-17 MED ORDER — ACETAMINOPHEN 650 MG RE SUPP: 650.0000 mg | Freq: Four times a day (QID) | RECTAL | Status: DC | PRN

## 2014-12-17 MED ORDER — APIXABAN 2.5 MG PO TABS
2.5000 mg | ORAL_TABLET | Freq: Two times a day (BID) | ORAL | Status: DC
Start: 2014-12-17 — End: 2015-06-16

## 2014-12-17 MED ORDER — PROPOFOL 10 MG/ML IV BOLUS
INTRAVENOUS | Status: DC | PRN
  Administered 2014-12-17: 200 mg via INTRAVENOUS
  Administered 2014-12-17: 30 mg via INTRAVENOUS

## 2014-12-17 MED ORDER — HYDROMORPHONE HCL 1 MG/ML IJ SOLN
0.5000 mg | INTRAMUSCULAR | Status: DC | PRN
  Administered 2014-12-17 – 2014-12-18 (×6): 0.5 mg via INTRAVENOUS
  Filled 2014-12-17 (×6): qty 1

## 2014-12-17 MED ORDER — SUCCINYLCHOLINE CHLORIDE 20 MG/ML IJ SOLN
INTRAMUSCULAR | Status: AC
  Filled 2014-12-17: qty 1

## 2014-12-17 MED ORDER — CEFAZOLIN SODIUM 1-5 GM-% IV SOLN
1.0000 g | Freq: Four times a day (QID) | INTRAVENOUS | Status: AC
  Administered 2014-12-17: 1 g via INTRAVENOUS
  Filled 2014-12-17 (×3): qty 50

## 2014-12-17 MED ORDER — LACTATED RINGERS IV SOLN
INTRAVENOUS | Status: DC | PRN
  Administered 2014-12-17 (×2): via INTRAVENOUS

## 2014-12-17 MED ORDER — SODIUM CHLORIDE 0.9 % IV SOLN
INTRAVENOUS | Status: DC
  Administered 2014-12-17 – 2014-12-18 (×2): via INTRAVENOUS

## 2014-12-17 MED ORDER — MENTHOL 3 MG MT LOZG: 1.0000 | LOZENGE | OROMUCOSAL | Status: DC | PRN

## 2014-12-17 MED ORDER — PHENOL 1.4 % MT LIQD: 1.0000 | OROMUCOSAL | Status: DC | PRN

## 2014-12-17 MED ORDER — EPHEDRINE SULFATE 50 MG/ML IJ SOLN
INTRAMUSCULAR | Status: DC | PRN
  Administered 2014-12-17: 10 mg via INTRAVENOUS
  Administered 2014-12-17: 5 mg via INTRAVENOUS

## 2014-12-17 MED ORDER — OXYCODONE-ACETAMINOPHEN 5-325 MG PO TABS: 1.0000 | ORAL_TABLET | ORAL | Status: DC | PRN

## 2014-12-17 MED ORDER — ROCURONIUM BROMIDE 50 MG/5ML IV SOLN
INTRAVENOUS | Status: AC
  Filled 2014-12-17: qty 1

## 2014-12-17 SURGICAL SUPPLY — 65 items
BANDAGE ELASTIC 4 VELCRO ST LF (GAUZE/BANDAGES/DRESSINGS) ×2 IMPLANT
BANDAGE ELASTIC 6 VELCRO ST LF (GAUZE/BANDAGES/DRESSINGS) ×2 IMPLANT
BANDAGE ESMARK 6X9 LF (GAUZE/BANDAGES/DRESSINGS) ×1 IMPLANT
BLADE SAGITTAL 25.0X1.19X90 (BLADE) ×2 IMPLANT
BLADE SAW SAG 90X13X1.27 (BLADE) ×2 IMPLANT
BNDG CMPR 9X6 STRL LF SNTH (GAUZE/BANDAGES/DRESSINGS) ×1
BNDG ESMARK 6X9 LF (GAUZE/BANDAGES/DRESSINGS) ×2
BOWL SMART MIX CTS (DISPOSABLE) ×2 IMPLANT
CAP KNEE TOTAL 3 SIGMA ×1 IMPLANT
CEMENT HV SMART SET (Cement) ×2 IMPLANT
COVER SURGICAL LIGHT HANDLE (MISCELLANEOUS) ×2 IMPLANT
CUFF TOURNIQUET SINGLE 34IN LL (TOURNIQUET CUFF) ×2 IMPLANT
CUFF TOURNIQUET SINGLE 44IN (TOURNIQUET CUFF) IMPLANT
DRAPE IMP U-DRAPE 54X76 (DRAPES) ×2 IMPLANT
DRAPE INCISE IOBAN 66X45 STRL (DRAPES) IMPLANT
DRAPE ORTHO SPLIT 77X108 STRL (DRAPES) ×4
DRAPE SURG ORHT 6 SPLT 77X108 (DRAPES) ×2 IMPLANT
DRAPE U-SHAPE 47X51 STRL (DRAPES) ×2 IMPLANT
DRSG ADAPTIC 3X8 NADH LF (GAUZE/BANDAGES/DRESSINGS) ×2 IMPLANT
DRSG PAD ABDOMINAL 8X10 ST (GAUZE/BANDAGES/DRESSINGS) ×3 IMPLANT
DURAPREP 26ML APPLICATOR (WOUND CARE) ×2 IMPLANT
ELECT REM PT RETURN 9FT ADLT (ELECTROSURGICAL) ×2
ELECTRODE REM PT RTRN 9FT ADLT (ELECTROSURGICAL) ×1 IMPLANT
EVACUATOR 1/8 PVC DRAIN (DRAIN) ×2 IMPLANT
FACESHIELD WRAPAROUND (MASK) ×4 IMPLANT
FACESHIELD WRAPAROUND OR TEAM (MASK) ×2 IMPLANT
FLOSEAL 10ML (HEMOSTASIS) IMPLANT
GAUZE SPONGE 4X4 12PLY STRL (GAUZE/BANDAGES/DRESSINGS) ×2 IMPLANT
GLOVE BIOGEL PI IND STRL 8 (GLOVE) ×4 IMPLANT
GLOVE BIOGEL PI INDICATOR 8 (GLOVE) ×4
GLOVE ORTHO TXT STRL SZ7.5 (GLOVE) ×6 IMPLANT
GLOVE SURG ORTHO 8.0 STRL STRW (GLOVE) ×6 IMPLANT
GOWN STRL REUS W/ TWL LRG LVL3 (GOWN DISPOSABLE) ×2 IMPLANT
GOWN STRL REUS W/ TWL XL LVL3 (GOWN DISPOSABLE) ×1 IMPLANT
GOWN STRL REUS W/TWL 2XL LVL3 (GOWN DISPOSABLE) ×2 IMPLANT
GOWN STRL REUS W/TWL LRG LVL3 (GOWN DISPOSABLE) ×4
GOWN STRL REUS W/TWL XL LVL3 (GOWN DISPOSABLE) ×2
HANDPIECE INTERPULSE COAX TIP (DISPOSABLE) ×2
HOOD PEEL AWAY FACE SHEILD DIS (HOOD) ×2 IMPLANT
IMMOBILIZER KNEE 22 UNIV (SOFTGOODS) IMPLANT
KIT BASIN OR (CUSTOM PROCEDURE TRAY) ×2 IMPLANT
KIT ROOM TURNOVER OR (KITS) ×2 IMPLANT
MANIFOLD NEPTUNE II (INSTRUMENTS) ×2 IMPLANT
NEEDLE 22X1 1/2 (OR ONLY) (NEEDLE) IMPLANT
NS IRRIG 1000ML POUR BTL (IV SOLUTION) ×2 IMPLANT
PACK TOTAL JOINT (CUSTOM PROCEDURE TRAY) ×2 IMPLANT
PACK UNIVERSAL I (CUSTOM PROCEDURE TRAY) ×2 IMPLANT
PAD ARMBOARD 7.5X6 YLW CONV (MISCELLANEOUS) ×4 IMPLANT
PAD CAST 4YDX4 CTTN HI CHSV (CAST SUPPLIES) ×1 IMPLANT
PADDING CAST COTTON 4X4 STRL (CAST SUPPLIES) ×2
PADDING CAST COTTON 6X4 STRL (CAST SUPPLIES) ×2 IMPLANT
SET HNDPC FAN SPRY TIP SCT (DISPOSABLE) ×1 IMPLANT
SPONGE GAUZE 4X4 12PLY STER LF (GAUZE/BANDAGES/DRESSINGS) ×1 IMPLANT
STAPLER VISISTAT 35W (STAPLE) ×2 IMPLANT
SUCTION FRAZIER TIP 10 FR DISP (SUCTIONS) ×2 IMPLANT
SUT ETHIBOND NAB CT1 #1 30IN (SUTURE) ×6 IMPLANT
SUT VIC AB 0 CT1 27 (SUTURE) ×2
SUT VIC AB 0 CT1 27XBRD ANBCTR (SUTURE) ×1 IMPLANT
SUT VIC AB 2-0 CT1 27 (SUTURE) ×4
SUT VIC AB 2-0 CT1 TAPERPNT 27 (SUTURE) ×2 IMPLANT
SYR CONTROL 10ML LL (SYRINGE) IMPLANT
TOWEL OR 17X24 6PK STRL BLUE (TOWEL DISPOSABLE) ×2 IMPLANT
TOWEL OR 17X26 10 PK STRL BLUE (TOWEL DISPOSABLE) ×2 IMPLANT
TRAY FOLEY CATH 16FRSI W/METER (SET/KITS/TRAYS/PACK) IMPLANT
WATER STERILE IRR 1000ML POUR (IV SOLUTION) ×4 IMPLANT

## 2014-12-17 NOTE — Anesthesia Preprocedure Evaluation (Signed)
Anesthesia Evaluation  Patient identified by MRN, date of birth, ID band Patient awake    Airway Mallampati: I       Dental   Pulmonary COPDformer smoker,    + decreased breath sounds      Cardiovascular Normal cardiovascular exam    Neuro/Psych    GI/Hepatic GERD-  ,  Endo/Other  Hypothyroidism   Renal/GU      Musculoskeletal  (+) Arthritis -,   Abdominal   Peds  Hematology   Anesthesia Other Findings   Reproductive/Obstetrics                             Anesthesia Physical Anesthesia Plan  ASA: II  Anesthesia Plan: General   Post-op Pain Management:    Induction: Intravenous  Airway Management Planned: Oral ETT  Additional Equipment:   Intra-op Plan:   Post-operative Plan: Extubation in OR  Informed Consent: I have reviewed the patients History and Physical, chart, labs and discussed the procedure including the risks, benefits and alternatives for the proposed anesthesia with the patient or authorized representative who has indicated his/her understanding and acceptance.     Plan Discussed with: CRNA, Surgeon and Anesthesiologist  Anesthesia Plan Comments:         Anesthesia Quick Evaluation

## 2014-12-17 NOTE — Anesthesia Postprocedure Evaluation (Signed)
  Anesthesia Post-op Note  Patient: Guy Morris  Procedure(s) Performed: Procedure(s): TOTAL KNEE ARTHROPLASTY (Right)  Patient Location: PACU  Anesthesia Type:General  Level of Consciousness: awake, alert , oriented and patient cooperative  Airway and Oxygen Therapy: Patient Spontanous Breathing  Post-op Pain: mild  Post-op Assessment: Post-op Vital signs reviewed, Patient's Cardiovascular Status Stable, Respiratory Function Stable, Patent Airway, No signs of Nausea or vomiting and Pain level controlled              Post-op Vital Signs: stable  Last Vitals:  Filed Vitals:   12/17/14 1130  BP:   Pulse: 82  Temp:   Resp: 14    Complications: No apparent anesthesia complications

## 2014-12-17 NOTE — Evaluation (Signed)
Physical Therapy Evaluation Patient Details Name: Guy Morris MRN: [id number] DOB: April 26, 1951 Today's Date: 12/17/2014   History of Present Illness  Pt admitted for right TKA with hx of chronic pain, GERD, shingles, BPH  Clinical Impression  Pt very eager to mobilize and return home. Pt in CPM on arrival with padding slid off bar and pt stating 10/10 pain, removed CPM with pain down to 8/10 with repositioning. RN provided pain meds and stated therapy would return later but pt adamant to work with therapy now to progress for home. Pt jumping up out of the bed but limited by nausea at door. Pt with decreased knee extension and maintaining flexion throughout ROM in and out of CPM. Pt educated for need to relax knee straight and precaution. Mr.Bachmann will benefit from acute therapy to maximize mobility, function, gait and ROM to decrease burden of care and return pt to active independent function. HEP handout provided.     Follow Up Recommendations Home health PT    Equipment Recommendations  None recommended by PT    Recommendations for Other Services       Precautions / Restrictions Precautions Precautions: Knee Required Braces or Orthoses: Knee Immobilizer - Right Knee Immobilizer - Right: On when out of bed or walking Restrictions Weight Bearing Restrictions: Yes RLE Weight Bearing: Weight bearing as tolerated      Mobility  Bed Mobility Overal bed mobility: Modified Independent             General bed mobility comments: pt somewhat impulsive with transfer and performing quickly with rails  Transfers Overall transfer level: Needs assistance   Transfers: Sit to/from Stand Sit to Stand: Supervision         General transfer comment: cues for hand placement and safety  Ambulation/Gait Ambulation/Gait assistance: Min guard Ambulation Distance (Feet): 40 Feet Assistive device: Rolling walker (2 wheeled) Gait Pattern/deviations: Step-through pattern;Decreased stride  length;Decreased stance time - right   Gait velocity interpretation: Below normal speed for age/gender General Gait Details: cues for posture and sequence, pt with nausea halfway and turned back to chair  Stairs            Wheelchair Mobility    Modified Rankin (Stroke Patients Only)       Balance Overall balance assessment: Needs assistance   Sitting balance-Leahy Scale: Good       Standing balance-Leahy Scale: Fair                               Pertinent Vitals/Pain Pain Assessment: 0-10 Pain Score: 7  Pain Location: right knee after activity and medication Pain Descriptors / Indicators: Aching Pain Intervention(s): Limited activity within patient's tolerance;RN gave pain meds during session;Repositioned;Monitored during session;Ice applied    Home Living Family/patient expects to be discharged to:: Private residence Living Arrangements: Spouse/significant other Available Help at Discharge: Family;Available 24 hours/day Type of Home: House Home Access: Level entry     Home Layout: One level Home Equipment: Walker - 2 wheels;Bedside commode      Prior Function Level of Independence: Independent         Comments: pt very active golfing and fishing     Hand Dominance        Extremity/Trunk Assessment   Upper Extremity Assessment: Overall WFL for tasks assessed           Lower Extremity Assessment: Overall WFL for tasks assessed (RLE limited ROM and strength as expected  POD#0)      Cervical / Trunk Assessment: Normal  Communication   Communication: No difficulties  Cognition Arousal/Alertness: Awake/alert Behavior During Therapy: WFL for tasks assessed/performed Overall Cognitive Status: Within Functional Limits for tasks assessed                      General Comments      Exercises Total Joint Exercises Ankle Circles/Pumps: AROM;Right;10 reps;Seated Quad Sets: AROM;Seated;Right;5 reps Heel Slides:  AAROM;Right;5 reps;Seated      Assessment/Plan    PT Assessment Patient needs continued PT services  PT Diagnosis Difficulty walking;Acute pain   PT Problem List Decreased strength;Decreased range of motion;Decreased activity tolerance;Decreased balance;Pain;Decreased knowledge of use of DME;Decreased mobility  PT Treatment Interventions Functional mobility training;Therapeutic activities;Therapeutic exercise;Patient/family education;Gait training;DME instruction   PT Goals (Current goals can be found in the Care Plan section) Acute Rehab PT Goals Patient Stated Goal: go home PT Goal Formulation: With patient/family Time For Goal Achievement: 12/31/14 Potential to Achieve Goals: Good    Frequency 7X/week   Barriers to discharge        Co-evaluation               End of Session   Activity Tolerance: Patient limited by pain Patient left: in chair;with call bell/phone within reach;with family/visitor present Nurse Communication: Mobility status;Precautions;Weight bearing status         Time: 1350-1418 PT Time Calculation (min) (ACUTE ONLY): 28 min   Charges:   PT Evaluation $Initial PT Evaluation Tier I: 1 Procedure PT Treatments $Therapeutic Activity: 8-22 mins   PT G CodesMelford Aase 12/17/2014, 2:28 PM Elwyn Reach, [place]

## 2014-12-17 NOTE — Transfer of Care (Signed)
Immediate Anesthesia Transfer of Care Note  Patient: Guy Morris  Procedure(s) Performed: Procedure(s): TOTAL KNEE ARTHROPLASTY (Right)  Patient Location: PACU  Anesthesia Type:General  Level of Consciousness: awake, alert  and oriented  Airway & Oxygen Therapy: Patient Spontanous Breathing and Patient connected to nasal cannula oxygen  Post-op Assessment: Report given to RN and Post -op Vital signs reviewed and stable  Post vital signs: Reviewed and stable  Last Vitals:  Filed Vitals:   12/17/14 0612  BP: 125/61  Pulse: 48  Temp: 36.4 C  Resp: 18    Complications: No apparent anesthesia complications

## 2014-12-17 NOTE — Progress Notes (Signed)
Orthopedic Tech Progress Note Patient Details:  Guy Morris 1950/10/13 [id number] CPM applied to RLE with appropriate settings. OHF applied to bed. Footsie roll provided. CPM Right Knee CPM Right Knee: On Right Knee Flexion (Degrees): 90 Right Knee Extension (Degrees): 0   [name] 12/17/2014, 11:04 AM

## 2014-12-17 NOTE — Anesthesia Procedure Notes (Addendum)
Procedure Name: Intubation Date/Time: 12/17/2014 7:54 AM Performed by: Merdis Delay Pre-anesthesia Checklist: Patient identified, Emergency Drugs available, Suction available, Patient being monitored and Timeout performed Patient Re-evaluated:Patient Re-evaluated prior to inductionOxygen Delivery Method: Circle system utilized Preoxygenation: Pre-oxygenation with 100% oxygen Intubation Type: IV induction Ventilation: Mask ventilation without difficulty and Oral airway inserted - appropriate to patient size Laryngoscope Size: Mac and 4 Grade View: Grade I Tube type: Oral Tube size: 7.5 mm Number of attempts: 1 Airway Equipment and Method: Stylet and LTA kit utilized Placement Confirmation: ETT inserted through vocal cords under direct vision,  positive ETCO2,  CO2 detector and breath sounds checked- equal and bilateral Secured at: 22 cm Tube secured with: Tape Dental Injury: Teeth and Oropharynx as per pre-operative assessment    Anesthesia Regional Block:  Femoral nerve block  Pre-Anesthetic Checklist: ,, timeout performed, Correct Patient, Correct Site, Correct Laterality, Correct Procedure, Correct Position, site marked, Risks and benefits discussed,  Surgical consent,  Pre-op evaluation,  At surgeon's request and post-op pain management  Laterality: Right  Prep: Maximum Sterile Barrier Precautions used, chloraprep and alcohol swabs       Needles:  Injection technique: Single-shot  Needle Type: Stimulator Needle - 80          Additional Needles:  Procedures: Doppler guided Femoral nerve block Narrative:  Start time: 12/17/2014 7:00 AM End time: 12/17/2014 7:10 AM Injection made incrementally with aspirations every 5 mL.  Performed by: Personally  Anesthesiologist: Kate Sable  Additional Notes: Pt accepts procedure w/ risks. 20cc 0.5% Marcaine w/ epi w/o difficulty w/ mild discomfort. GES

## 2014-12-17 NOTE — Brief Op Note (Signed)
12/17/2014  10:18 AM  PATIENT:  Guy Morris  64 y.o. male  PRE-OPERATIVE DIAGNOSIS:  OA RIGHT KNEE  POST-OPERATIVE DIAGNOSIS:  OA RIGHT KNEE  PROCEDURE:  Procedure(s): TOTAL KNEE ARTHROPLASTY (Right)  SURGEON:  Surgeon(s) and Role:    * Earlie Server, MD - Primary  PHYSICIAN ASSISTANT: Chriss Czar, PA-C  ASSISTANTS:   ANESTHESIA:   regional and general  EBL:  Total I/O In: 1600 [I.V.:1600] Out: 50 [Blood:50]  BLOOD ADMINISTERED:none  DRAINS: none   LOCAL MEDICATIONS USED:  NONE  SPECIMEN:  No Specimen  DISPOSITION OF SPECIMEN:  N/A  COUNTS:  YES  TOURNIQUET:   Total Tourniquet Time Documented: Thigh (Right) - 80 minutes Total: Thigh (Right) - 80 minutes   DICTATION: .Other Dictation: Dictation Number unknown  PLAN OF CARE: Admit to inpatient   PATIENT DISPOSITION:  PACU - hemodynamically stable.   Delay start of Pharmacological VTE agent (>24hrs) due to surgical blood loss or risk of bleeding: yes

## 2014-12-17 NOTE — Interval H&P Note (Signed)
History and Physical Interval Note:  12/17/2014 7:25 AM  Guy Morris  has presented today for surgery, with the diagnosis of OA RIGHT KNEE  The various methods of treatment have been discussed with the patient and family. After consideration of risks, benefits and other options for treatment, the patient has consented to  Procedure(s): TOTAL KNEE ARTHROPLASTY (Right) as a surgical intervention .  The patient's history has been reviewed, patient examined, no change in status, stable for surgery.  I have reviewed the patient's chart and labs.  Questions were answered to the patient's satisfaction.     [name],W D
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
## 2014-12-17 NOTE — Progress Notes (Signed)
Utilization review completed.  

## 2014-12-18 LAB — BASIC METABOLIC PANEL
Anion gap: 6 (ref 5–15)
BUN: <5 mg/dL — ABNORMAL LOW (ref 6–20)
CO2: 29 mmol/L (ref 22–32)
Calcium: 8.8 mg/dL — ABNORMAL LOW (ref 8.9–10.3)
Chloride: 102 mmol/L (ref 101–111)
Creatinine, Ser: 0.72 mg/dL (ref 0.61–1.24)
GFR calc Af Amer: >60 mL/min (ref >60)
GFR calc non Af Amer: >60 mL/min (ref >60)
Glucose, Bld: 127 mg/dL — ABNORMAL HIGH (ref 65–99)
Potassium: 4.1 mmol/L (ref 3.5–5.1)
Sodium: 137 mmol/L (ref 135–145)

## 2014-12-18 LAB — CBC
HCT: 38 % — ABNORMAL LOW (ref 39.0–52.0)
Hemoglobin: 12.5 g/dL — ABNORMAL LOW (ref 13.0–17.0)
MCH: 30.7 pg (ref 26.0–34.0)
MCHC: 32.9 g/dL (ref 30.0–36.0)
MCV: 93.4 fL (ref 78.0–100.0)
Platelets: 240 10*3/uL (ref 150–400)
RBC: 4.07 MIL/uL — ABNORMAL LOW (ref 4.22–5.81)
RDW: 12.8 % (ref 11.5–15.5)
WBC: 11.9 10*3/uL — ABNORMAL HIGH (ref 4.0–10.5)

## 2014-12-18 NOTE — Op Note (Signed)
Guy Morris, Guy Morris                ACCOUNT NO.:  [id number]  MEDICAL RECORD NO.:  [id number]  LOCATION:  5N09C                        FACILITY:  [facility]  PHYSICIAN:  Lockie Pares, M.D.    DATE OF BIRTH:  03-10-1951  DATE OF PROCEDURE: DATE OF DISCHARGE:                              OPERATIVE REPORT   INDICATIONS:  This is a 64 year old with severe osteoarthritis of the knee with severe varus deformity, flexion contracture, thought to be amenable to hospitalization and replacement.  PREOPERATIVE DIAGNOSIS:  Severe osteoarthritis, right knee.  POSTOPERATIVE DIAGNOSIS:  Severe osteoarthritis, right knee.  OPERATION:  Right total knee replacement (Sigma size 4 femur and tibia, 41-mm patella all poly with 50-mm bearing.  SURGEON:  Lockie Pares, M.D.  ASSISTANT:  Guy Czar, PA-C.  ANESTHESIA:  General with the nerve block.  DESCRIPTION OF PROCEDURE:  After general anesthetic and nerve block, supine position and placed in 350.  Straight skin incision was made.  He had old small medial incision and more extensive lateral incision; however, we definitely felt there was a good skin bridge remaining with a straight midline incision, which would be the only practical incision to use for this knee.  Approached through a medial parapatellar approach to the knee revealed a very diseased deformity with severe varus deformity, severe osteophytic formation on the medial side of the knee and a flexion contracture.  We cut 11 mm of distal femur for a 5-degree valgus cut followed by about 2-3 below the most diseased medial compartment on the tibia with the appropriate degree of alignment and slope, aiming for about a -2 degree of tibial slope.  We then checked the extension gap after extensive medial release in stripping to eventually be 15 mm.  This was despite a very minimal tibial cut.  The femur was sized to be a size 4.  We then placed all-in-1 cutting block for an anterior,  posterior and chamfer cuts, and then we removed the posterior aspect of the knee with excess remnants of the menisci as well as posterior osteophytes.  PCL was completely released.  Flexion gap was checked and matched the extension gap at 15 mm.  Guy Morris was cut for the tibia followed by the tibial baseplate, used as a trial with a 15-mm bearing and the box was then cut for the femur.  We did resect some osteophytes off the lateral and more noticeable osteophytes off the medial side of the femur.  The patella was measured to resect approximately 9 mm of patella for 41-mm all poly trial.  Trial was placed on the tibia followed by the femur and patella and we trialed to full extension or nearly full extension on the table with good stability of varus and valgus with the knee extended and flexed.  Dorsi was negligible as well with a 15 bearing.  Trial components were removed. Bony surfaces were irrigated.  We then inserted the cement in a doughy state, tibia followed by femur and patella.  We did elect to use a trial bearing, allowed to cement harden and a 15-mm bearing.  Once the cement was hardened, we removed the trial bearing, small bits of cement were  removed from the back of the knee.  We then irrigated and then released the tourniquet.  Tourniquet was released.  No excessive bleeding was noted.  Final bearing was placed, all parameters were deemed to be acceptable again.  Closure was affected with #1 Ethibond, 2-0 Vicryl and skin clips.  Lightly compressive sterile dressing and knee immobilizer applied, taken to the recovery room in stable condition.     Lockie Pares, M.D.   ______________________________ Lockie Pares, M.D.    WDC/MEDQ  D:  12/17/2014  T:  12/18/2014  Job:  [id number]

## 2014-12-18 NOTE — Progress Notes (Signed)
Physical Therapy Treatment Patient Details Name: Guy Morris MRN: [id number] DOB: Jan 13, 1951 Today's Date: 12/18/2014    History of Present Illness Pt admitted for right TKA with hx of chronic pain, GERD, shingles, BPH    PT Comments    Patient cleared by nursing, patient reports significant nausea > pain, but agreeable to therapy.  As exercises and mobility continued, pain and nausea decreased somewhat, feeling more comfortable by end of session.  Patient requiring MIN assist overall for safety and needs assist for ROM in R knee.  Patient is appropriate to continue with skilled PT services at this time, progressing towards goals.  Follow Up Recommendations  Home health PT     Equipment Recommendations  None recommended by PT    Recommendations for Other Services       Precautions / Restrictions Precautions Precautions: Knee Required Braces or Orthoses: Knee Immobilizer - Right Knee Immobilizer - Right: On when out of bed or walking Restrictions Weight Bearing Restrictions: Yes RLE Weight Bearing: Weight bearing as tolerated    Mobility  Bed Mobility Overal bed mobility: Modified Independent             General bed mobility comments: pt somewhat impulsive with transfer and performing quickly with rails  Transfers Overall transfer level: Needs assistance Equipment used: Rolling walker (2 wheeled) Transfers: Sit to/from Stand Sit to Stand: Min guard         General transfer comment: cues for hand placement and safety  Ambulation/Gait Ambulation/Gait assistance: Min guard Ambulation Distance (Feet): 150 Feet Assistive device: Rolling walker (2 wheeled) Gait Pattern/deviations: Step-to pattern;Antalgic   Gait velocity interpretation: Below normal speed for age/gender General Gait Details: cues for posture and sequence, standing rest breaks   Stairs            Wheelchair Mobility    Modified Rankin (Stroke Patients Only)       Balance Overall  balance assessment: Needs assistance   Sitting balance-Leahy Scale: Good       Standing balance-Leahy Scale: Fair                      Cognition Arousal/Alertness: Awake/alert Behavior During Therapy: WFL for tasks assessed/performed Overall Cognitive Status: Within Functional Limits for tasks assessed                      Exercises Total Joint Exercises Ankle Circles/Pumps: AROM;Both;10 reps;Supine Quad Sets: AROM;Right;10 reps;Supine Short Arc Quad: AAROM;Right;10 reps;Supine Heel Slides: AAROM;Right;10 reps;Supine Straight Leg Raises: AAROM;Right;10 reps;Supine Standing Hip Extension: AROM;Right;10 reps    General Comments        Pertinent Vitals/Pain Pain Assessment: 0-10 Pain Score: 6  Pain Location: R knee and thigh Pain Descriptors / Indicators: Aching;Sharp;Sore Pain Intervention(s): Limited activity within patient's tolerance;Monitored during session;Relaxation    Home Living Family/patient expects to be discharged to:: Private residence Living Arrangements: Spouse/significant other Available Help at Discharge: Family;Available 24 hours/day Type of Home: House Home Access: Level entry   Home Layout: One level Home Equipment: Walker - 2 wheels;Bedside commode      Prior Function Level of Independence: Independent      Comments: pt very active golfing and fishing   PT Goals (current goals can now be found in the care plan section) Acute Rehab PT Goals Patient Stated Goal: go home PT Goal Formulation: With patient/family Time For Goal Achievement: 12/31/14 Potential to Achieve Goals: Good Progress towards PT goals: Progressing toward goals    Frequency  7X/week    PT Plan Current plan remains appropriate    Co-evaluation             End of Session Equipment Utilized During Treatment: Gait belt Activity Tolerance: Patient tolerated treatment well;Patient limited by pain Patient left: in chair;with call bell/phone within  reach;with family/visitor present     Time: 1005-1045 PT Time Calculation (min) (ACUTE ONLY): 40 min  Charges:  $Gait Training: 8-22 mins $Therapeutic Exercise: 8-22 mins $Therapeutic Activity: 8-22 mins                    G Codes:      Guy Morris L Dec 30, 2014, 12:13 PM

## 2014-12-18 NOTE — Progress Notes (Signed)
Orthopedic Tech Progress Note Patient Details:  Guy Morris 11-Feb-1951 [id number] On cpm at 7:30 pm Patient ID: Guy Morris, male   DOB: July 17, 1950, 64 y.o.   MRN: [id number]   [name] 12/18/2014, 7:33 PM

## 2014-12-18 NOTE — Evaluation (Signed)
Occupational Therapy Evaluation Patient Details Name: Guy Morris MRN: [id number] DOB: 1950/09/10 Today's Date: 12/18/2014    History of Present Illness Pt admitted for right TKA with hx of chronic pain, GERD, shingles, BPH   Clinical Impression   Pt at min A level with LB ADLs, set up with UB ADLs and min guard A with ADL mobility. Pt will have 24 hour assist at home form his wife and plan to get a tub bench. All education completed and no further acute OT indicated at this time    Follow Up Recommendations  No OT follow up    Equipment Recommendations  3 in 1 bedside comode;Tub/shower bench    Recommendations for Other Services       Precautions / Restrictions Precautions Precautions: Knee Required Braces or Orthoses: Knee Immobilizer - Right Knee Immobilizer - Right: On when out of bed or walking Restrictions Weight Bearing Restrictions: Yes RLE Weight Bearing: Weight bearing as tolerated      Mobility Bed Mobility Overal bed mobility: Modified Independent             General bed mobility comments: pt somewhat impulsive with transfer and performing quickly with rails  Transfers Overall transfer level: Needs assistance Equipment used: Rolling walker (2 wheeled) Transfers: Sit to/from Stand Sit to Stand: Min guard         General transfer comment: cues for hand placement and safety    Balance Overall balance assessment: Needs assistance   Sitting balance-Leahy Scale: Good       Standing balance-Leahy Scale: Fair                              ADL Overall ADL's : Needs assistance/impaired     Grooming: Wash/dry hands;Wash/dry face;Min guard   Upper Body Bathing: Set up;Sitting   Lower Body Bathing: Minimal assistance   Upper Body Dressing : Set up;Sitting   Lower Body Dressing: Minimal assistance   Toilet Transfer: Min guard;Comfort height toilet;Grab bars;RW   Toileting- Clothing Manipulation and Hygiene: Min guard;Sit  to/from Nurse, children's Details (indicate cue type and reason): pt stated that he will be getting  tub bench Functional mobility during ADLs: Min guard;Cueing for safety;Rolling walker General ADL Comments: Educated pt and family on tub bench for home use     Vision  reading glasses, no change from baseline   Perception Perception Perception Tested?: No   Praxis Praxis Praxis tested?: Not tested    Pertinent Vitals/Pain Pain Assessment: 0-10 Pain Score: 6  Pain Location: R knee Pain Descriptors / Indicators: Aching;Sore Pain Intervention(s): Monitored during session;Repositioned;Relaxation;Ice applied     Hand Dominance Right   Extremity/Trunk Assessment Upper Extremity Assessment Upper Extremity Assessment: Overall WFL for tasks assessed   Lower Extremity Assessment Lower Extremity Assessment: Defer to PT evaluation   Cervical / Trunk Assessment Cervical / Trunk Assessment: Normal   Communication Communication Communication: No difficulties   Cognition Arousal/Alertness: Awake/alert Behavior During Therapy: WFL for tasks assessed/performed Overall Cognitive Status: Within Functional Limits for tasks assessed                     General Comments   pt very pleasant and cooperative                Home Living Family/patient expects to be discharged to:: Private residence Living Arrangements: Spouse/significant other Available Help at Discharge: Family;Available 24 hours/day Type of  Home: House Home Access: Level entry     Home Layout: One level     Bathroom Shower/Tub: Teacher, early years/pre: Standard     Home Equipment: Environmental consultant - 2 wheels;Bedside commode          Prior Functioning/Environment Level of Independence: Independent        Comments: pt very active golfing and fishing    OT Diagnosis:     OT Problem List: Decreased activity tolerance;Decreased knowledge of use of DME or AE   OT  Treatment/Interventions:      OT Goals(Current goals can be found in the care plan section) Acute Rehab OT Goals Patient Stated Goal: go home OT Goal Formulation: With patient/family  OT Frequency:  none   Barriers to D/C:  none                        End of Session Equipment Utilized During Treatment: Rolling walker;Other (comment) (3 in 1) CPM Right Knee CPM Right Knee: Off  Activity Tolerance: Patient tolerated treatment well Patient left: in chair   Time: 1347-1410 OT Time Calculation (min): 23 min Charges:  OT General Charges $OT Visit: 1 Procedure OT Evaluation $Initial OT Evaluation Tier I: 1 Procedure OT Treatments $Therapeutic Activity: 8-22 mins G-Codes:    Britt Bottom 12/18/2014, 3:03 PM

## 2014-12-18 NOTE — Progress Notes (Signed)
     Subjective:  POD#1 R TKA. Patient reports pain as moderate.  Resting comfortably in bed.  Tolerated working with PT well this morning. Will plan to discharge home tomorrow if PT this afternoon goes well and no issues with pain control.  Patient has some nausea post-op, so we will also monitor this throughout today.   Objective:   VITALS:   Filed Vitals:   12/17/14 1700 12/17/14 2035 12/18/14 0028 12/18/14 0422  BP: 121/71 120/69 129/55 119/59  Pulse: 58 66 63 68  Temp: 97.7 F (36.5 C) 98.3 F (36.8 C)  98.9 F (37.2 C)  TempSrc: Oral Oral  Oral  Resp: 16 16  16   Height:      Weight:      SpO2: 96% 97% 96% 97%    Neurologically intact ABD soft Neurovascular intact Sensation intact distally Intact pulses distally Dorsiflexion/Plantar flexion intact Incision: dressing C/D/I   Lab Results  Component Value Date   WBC 11.9* 12/18/2014   HGB 12.5* 12/18/2014   HCT 38.0* 12/18/2014   MCV 93.4 12/18/2014   PLT 240 12/18/2014   BMET    Component Value Date/Time   NA 137 12/18/2014 0356   K 4.1 12/18/2014 0356   CL 102 12/18/2014 0356   CO2 29 12/18/2014 0356   GLUCOSE 127* 12/18/2014 0356   BUN <5* 12/18/2014 0356   CREATININE 0.72 12/18/2014 0356   CALCIUM 8.8* 12/18/2014 0356   GFRNONAA >60 12/18/2014 0356   GFRAA >60 12/18/2014 0356     Assessment/Plan: 1 Day Post-Op   Active Problems:   Primary localized osteoarthritis of right knee   Up with therapy WBAT in the RLE Eliquis for DVT prophylaxis Plan to discharge home tomorrow if nausea improves and no issues overnight.    Vitaliy Eisenhour Lelan Pons 12/18/2014, 1:14 PM Cell [phone]

## 2014-12-18 NOTE — Discharge Instructions (Signed)
INSTRUCTIONS AFTER JOINT REPLACEMENT  ° °o Remove items at home which could result in a fall. This includes throw rugs or furniture in walking pathways °o ICE to the affected joint every three hours while awake for 30 minutes at a time, for at least the first 3-5 days, and then as needed for pain and swelling.  Continue to use ice for pain and swelling. You may notice swelling that will progress down to the foot and ankle.  This is normal after surgery.  Elevate your leg when you are not up walking on it.   °o Continue to use the breathing machine you got in the hospital (incentive spirometer) which will help keep your temperature down.  It is common for your temperature to cycle up and down following surgery, especially at night when you are not up moving around and exerting yourself.  The breathing machine keeps your lungs expanded and your temperature down. ° ° °DIET:  As you were doing prior to hospitalization, we recommend a well-balanced diet. ° °DRESSING / WOUND CARE / SHOWERING ° °You may change your dressing 3-5 days after surgery.  Then change the dressing every day with sterile gauze.  Please use good hand washing techniques before changing the dressing.  Do not use any lotions or creams on the incision until instructed by your surgeon. ° °ACTIVITY ° °o Increase activity slowly as tolerated, but follow the weight bearing instructions below.   °o No driving for 6 weeks or until further direction given by your physician.  You cannot drive while taking narcotics.  °o No lifting or carrying greater than 10 lbs. until further directed by your surgeon. °o Avoid periods of inactivity such as sitting longer than an hour when not asleep. This helps prevent blood clots.  °o You may return to work once you are authorized by your doctor.  ° ° ° °WEIGHT BEARING  ° °Weight bearing as tolerated with assist device (walker, cane, etc) as directed, use it as long as suggested by your surgeon or therapist, typically at  least 4-6 weeks. ° ° °EXERCISES ° °Results after joint replacement surgery are often greatly improved when you follow the exercise, range of motion and muscle strengthening exercises prescribed by your doctor. Safety measures are also important to protect the joint from further injury. Any time any of these exercises cause you to have increased pain or swelling, decrease what you are doing until you are comfortable again and then slowly increase them. If you have problems or questions, call your caregiver or physical therapist for advice.  ° °Rehabilitation is important following a joint replacement. After just a few days of immobilization, the muscles of the leg can become weakened and shrink (atrophy).  These exercises are designed to build up the tone and strength of the thigh and leg muscles and to improve motion. Often times heat used for twenty to thirty minutes before working out will loosen up your tissues and help with improving the range of motion but do not use heat for the first two weeks following surgery (sometimes heat can increase post-operative swelling).  ° °These exercises can be done on a training (exercise) mat, on the floor, on a table or on a bed. Use whatever works the best and is most comfortable for you.    Use music or television while you are exercising so that the exercises are a pleasant break in your day. This will make your life better with the exercises acting as a break   in your routine that you can look forward to.   Perform all exercises about fifteen times, three times per day or as directed.  You should exercise both the operative leg and the other leg as well. ° °Exercises include: °  °• Quad Sets - Tighten up the muscle on the front of the thigh (Quad) and hold for 5-10 seconds.   °• Straight Leg Raises - With your knee straight (if you were given a brace, keep it on), lift the leg to 60 degrees, hold for 3 seconds, and slowly lower the leg.  Perform this exercise against  resistance later as your leg gets stronger.  °• Leg Slides: Lying on your back, slowly slide your foot toward your buttocks, bending your knee up off the floor (only go as far as is comfortable). Then slowly slide your foot back down until your leg is flat on the floor again.  °• Angel Wings: Lying on your back spread your legs to the side as far apart as you can without causing discomfort.  °• Hamstring Strength:  Lying on your back, push your heel against the floor with your leg straight by tightening up the muscles of your buttocks.  Repeat, but this time bend your knee to a comfortable angle, and push your heel against the floor.  You may put a pillow under the heel to make it more comfortable if necessary.  ° °A rehabilitation program following joint replacement surgery can speed recovery and prevent re-injury in the future due to weakened muscles. Contact your doctor or a physical therapist for more information on knee rehabilitation.  ° ° °CONSTIPATION ° °Constipation is defined medically as fewer than three stools per week and severe constipation as less than one stool per week.  Even if you have a regular bowel pattern at home, your normal regimen is likely to be disrupted due to multiple reasons following surgery.  Combination of anesthesia, postoperative narcotics, change in appetite and fluid intake all can affect your bowels.  ° °YOU MUST use at least one of the following options; they are listed in order of increasing strength to get the job done.  They are all available over the counter, and you may need to use some, POSSIBLY even all of these options:   ° °Drink plenty of fluids (prune juice may be helpful) and high fiber foods °Colace 100 mg by mouth twice a day  °Senokot for constipation as directed and as needed Dulcolax (bisacodyl), take with full glass of water  °Miralax (polyethylene glycol) once or twice a day as needed. ° °If you have tried all these things and are unable to have a bowel  movement in the first 3-4 days after surgery call either your surgeon or your primary doctor.   ° °If you experience loose stools or diarrhea, hold the medications until you stool forms back up.  If your symptoms do not get better within 1 week or if they get worse, check with your doctor.  If you experience "the worst abdominal pain ever" or develop nausea or vomiting, please contact the office immediately for further recommendations for treatment. ° ° °ITCHING:  If you experience itching with your medications, try taking only a single pain pill, or even half a pain pill at a time.  You can also use Benadryl over the counter for itching or also to help with sleep.  ° °TED HOSE STOCKINGS:  Use stockings on both legs until for at least 2 weeks or as   directed by physician office. They may be removed at night for sleeping.  MEDICATIONS:  See your medication summary on the After Visit Summary that nursing will review with you.  You may have some home medications which will be placed on hold until you complete the course of blood thinner medication.  It is important for you to complete the blood thinner medication as prescribed.  PRECAUTIONS:  If you experience chest pain or shortness of breath - call 911 immediately for transfer to the hospital emergency department.   If you develop a fever greater that 101 F, purulent drainage from wound, increased redness or drainage from wound, foul odor from the wound/dressing, or calf pain - CONTACT YOUR SURGEON.                                                   FOLLOW-UP APPOINTMENTS:  If you do not already have a post-op appointment, please call the office for an appointment to be seen by your surgeon.  Guidelines for how soon to be seen are listed in your After Visit Summary, but are typically between 1-4 weeks after surgery.  OTHER INSTRUCTIONS:   Knee Replacement:  Do not place pillow under knee, focus on keeping the knee straight while resting. CPM  instructions: 0-90 degrees, 2 hours in the morning, 2 hours in the afternoon, and 2 hours in the evening. Place foam block, curve side up under heel at all times except when in CPM or when walking.  DO NOT modify, tear, cut, or change the foam block in any way.  MAKE SURE YOU:   Understand these instructions.   Get help right away if you are not doing well or get worse.    Thank you for letting us be a part of your medical care team.  It is a privilege we respect greatly.  We hope these instructions will help you stay on track for a fast and full recovery!  ---------------------------------------------------------------------------  Information on my medicine - ELIQUIS (apixaban)  This medication education was reviewed with me or my healthcare representative as part of my discharge preparation.  The pharmacist that spoke with me during my hospital stay was:  Roma Schanz, [facility]  Why was Eliquis prescribed for you? Eliquis was prescribed for you to reduce the risk of blood clots forming after orthopedic surgery.    What do You need to know about Eliquis? Take your Eliquis TWICE DAILY - one tablet in the morning and one tablet in the evening with or without food.  It would be best to take the dose about the same time each day.  If you have difficulty swallowing the tablet whole please discuss with your pharmacist how to take the medication safely.  Take Eliquis exactly as prescribed by your doctor and DO NOT stop taking Eliquis without talking to the doctor who prescribed the medication.  Stopping without other medication to take the place of Eliquis may increase your risk of developing a clot.  After discharge, you should have regular check-up appointments with your healthcare provider that is prescribing your Eliquis.  What do you do if you miss a dose? If a dose of ELIQUIS is not taken at the scheduled time, take it as soon as possible on the same day and twice-daily  administration should be resumed.  The dose should not  be doubled to make up for a missed dose.  Do not take more than one tablet of ELIQUIS at the same time.  Important Safety Information A possible side effect of Eliquis is bleeding. You should call your healthcare provider right away if you experience any of the following: ? Bleeding from an injury or your nose that does not stop. ? Unusual colored urine (red or dark brown) or unusual colored stools (red or black). ? Unusual bruising for unknown reasons. ? A serious fall or if you hit your head (even if there is no bleeding).  Some medicines may interact with Eliquis and might increase your risk of bleeding or clotting while on Eliquis. To help avoid this, consult your healthcare provider or pharmacist prior to using any new prescription or non-prescription medications, including herbals, vitamins, non-steroidal anti-inflammatory drugs (NSAIDs) and supplements.  This website has more information on Eliquis (apixaban): http://www.eliquis.com/eliquis/home

## 2014-12-18 NOTE — Progress Notes (Signed)
Physical Therapy Treatment Patient Details Name: Guy Morris MRN: [id number] DOB: 06-19-1950 Today's Date: 12/18/2014    History of Present Illness Pt admitted for right TKA with hx of chronic pain, GERD, shingles, BPH    PT Comments    Patient nausea improved from AM, patient performing knee exercises at edge of bed on arrival.  Improved mobility and stability, Mild to Standby assist for all mobility, Gait including stair training this afternoon.  Excellent flexion, need emphasis on knee extension.  Patient is progressing well, is appropriate to continue with skilled PT services.  Follow Up Recommendations  Home health PT     Equipment Recommendations  None recommended by PT    Recommendations for Other Services       Precautions / Restrictions Precautions Precautions: Knee Required Braces or Orthoses: Knee Immobilizer - Right Knee Immobilizer - Right: On when out of bed or walking Restrictions Weight Bearing Restrictions: Yes RLE Weight Bearing: Weight bearing as tolerated    Mobility  Bed Mobility Overal bed mobility: Modified Independent             General bed mobility comments: Good performance  Transfers Overall transfer level: Needs assistance Equipment used: Rolling walker (2 wheeled) Transfers: Sit to/from Stand Sit to Stand: Min guard;Supervision         General transfer comment: cues for hand placement and safety  Ambulation/Gait Ambulation/Gait assistance: Supervision Ambulation Distance (Feet): 175 Feet Assistive device: Rolling walker (2 wheeled) Gait Pattern/deviations: Step-through pattern;Antalgic   Gait velocity interpretation: Below normal speed for age/gender General Gait Details: Cues for posture   Stairs Stairs: Yes Stairs assistance: Min guard Stair Management: Two rails;Step to pattern Number of Stairs: 5 General stair comments: Cues for sequence on stairs as well as curb step with RW.  Wheelchair Mobility    Modified  Rankin (Stroke Patients Only)       Balance     Sitting balance-Leahy Scale: Good       Standing balance-Leahy Scale: Fair                      Cognition Arousal/Alertness: Awake/alert Behavior During Therapy: WFL for tasks assessed/performed Overall Cognitive Status: Within Functional Limits for tasks assessed                      Exercises Total Joint Exercises Ankle Circles/Pumps: AROM;Both;10 reps;Supine Quad Sets: AROM;Right;10 reps;Supine Short Arc Quad: AAROM;Right;10 reps;Supine Heel Slides: AAROM;Right;10 reps;Supine Straight Leg Raises: AAROM;Right;10 reps;Supine Goniometric ROM: 15-100 Standing Hip Extension: AROM;Right;10 reps    General Comments        Pertinent Vitals/Pain Pain Assessment: 0-10 Pain Score: 4  Pain Location: R knee Pain Descriptors / Indicators: Aching;Sore Pain Intervention(s): Monitored during session    Home Living Family/patient expects to be discharged to:: Private residence Living Arrangements: Spouse/significant other Available Help at Discharge: Family;Available 24 hours/day Type of Home: House Home Access: Level entry   Home Layout: One level Home Equipment: Walker - 2 wheels;Bedside commode      Prior Function Level of Independence: Independent      Comments: pt very active golfing and fishing   PT Goals (current goals can now be found in the care plan section) Acute Rehab PT Goals Patient Stated Goal: go home PT Goal Formulation: With patient/family Time For Goal Achievement: 12/31/14 Potential to Achieve Goals: Good Progress towards PT goals: Progressing toward goals    Frequency  7X/week    PT Plan Current plan  remains appropriate    Co-evaluation             End of Session Equipment Utilized During Treatment: Gait belt Activity Tolerance: Patient tolerated treatment well;Patient limited by pain Patient left: in CPM;with call bell/phone within reach;with family/visitor present      Time: 5732-2025 PT Time Calculation (min) (ACUTE ONLY): 35 min  Charges:  $Gait Training: 8-22 mins $Therapeutic Exercise: 8-22 mins                    G Codes:      Niyati Heinke L Jan 08, 2015, 5:06 PM

## 2014-12-19 LAB — CBC
HCT: 38.2 % — ABNORMAL LOW (ref 39.0–52.0)
Hemoglobin: 12.8 g/dL — ABNORMAL LOW (ref 13.0–17.0)
MCH: 31.6 pg (ref 26.0–34.0)
MCHC: 33.5 g/dL (ref 30.0–36.0)
MCV: 94.3 fL (ref 78.0–100.0)
Platelets: 216 10*3/uL (ref 150–400)
RBC: 4.05 MIL/uL — ABNORMAL LOW (ref 4.22–5.81)
RDW: 12.8 % (ref 11.5–15.5)
WBC: 14.1 10*3/uL — ABNORMAL HIGH (ref 4.0–10.5)

## 2014-12-19 NOTE — Progress Notes (Signed)
Guy Morris discharged home per MD order. Discharge instructions reviewed and discussed with patient. All questions and concerns answered. Copy of instructions and scripts given to patient. IV removed.  Patient escorted to car by staff in a wheelchair. No distress noted upon discharge.   [name] 12/19/2014 11:13 AM

## 2014-12-19 NOTE — Progress Notes (Signed)
     Subjective:  POD#2 R TKA. Patient reports pain as moderate.  Was able to ambulate in the halls today with PT.  No issues overnight.   Objective:   VITALS:   Filed Vitals:   12/18/14 0422 12/18/14 1409 12/18/14 2143 12/19/14 0410  BP: 119/59 127/68 128/85 133/64  Pulse: 68 65 80 81  Temp: 98.9 F (37.2 C) 98.4 F (36.9 C) 98 F (36.7 C) 98.6 F (37 C)  TempSrc: Oral Oral Oral Oral  Resp: 16 18 18 18   Height:      Weight:      SpO2: 97% 100% 96% 97%    Neurologically intact ABD soft Neurovascular intact Sensation intact distally Intact pulses distally Dorsiflexion/Plantar flexion intact Incision: dressing C/D/I   Lab Results  Component Value Date   WBC 14.1* 12/19/2014   HGB 12.8* 12/19/2014   HCT 38.2* 12/19/2014   MCV 94.3 12/19/2014   PLT 216 12/19/2014   BMET    Component Value Date/Time   NA 137 12/18/2014 0356   K 4.1 12/18/2014 0356   CL 102 12/18/2014 0356   CO2 29 12/18/2014 0356   GLUCOSE 127* 12/18/2014 0356   BUN <5* 12/18/2014 0356   CREATININE 0.72 12/18/2014 0356   CALCIUM 8.8* 12/18/2014 0356   GFRNONAA >60 12/18/2014 0356   GFRAA >60 12/18/2014 0356     Assessment/Plan: 2 Days Post-Op   Active Problems:   Primary localized osteoarthritis of right knee   Up with therapy WBAT in the RLE Eliquis for DVT prophylaxis Dressing changed today and drain pulled.  Discharging home today.    Britanny Marksberry Lelan Pons 12/19/2014, 10:48 AM Cell [phone]

## 2014-12-19 NOTE — Progress Notes (Signed)
Physical Therapy Treatment Patient Details Name: Guy Morris MRN: [id number] DOB: 01/17/1951 Today's Date: 12/19/2014    History of Present Illness Pt admitted for right TKA with hx of chronic pain, GERD, shingles, BPH    PT Comments    Pt with c/o R foot pain at lateral aspect that started in CPM machine last night. PA made aware as she is preparing to remove dressing. Pt declined there ex this AM since he is preparing for d/c home. Ambulated in hallway without KI with no knee buckling, however, knee positioned in ~ 30 degrees of flexion. Advised pt to have KI in place when ascending steps into house today.  Follow Up Recommendations  Home health PT     Equipment Recommendations  None recommended by PT    Recommendations for Other Services       Precautions / Restrictions Precautions Precautions: Knee Required Braces or Orthoses: Knee Immobilizer - Right Knee Immobilizer - Right: On when out of bed or walking Restrictions Weight Bearing Restrictions: Yes RLE Weight Bearing: Weight bearing as tolerated    Mobility  Bed Mobility Overal bed mobility: Modified Independent                Transfers   Equipment used: Rolling walker (2 wheeled)   Sit to Stand: Supervision            Ambulation/Gait Ambulation/Gait assistance: Supervision Ambulation Distance (Feet): 200 Feet Assistive device: Rolling walker (2 wheeled) Gait Pattern/deviations: Antalgic;Step-through pattern   Gait velocity interpretation: Below normal speed for age/gender General Gait Details: Cues for posture   Stairs            Wheelchair Mobility    Modified Rankin (Stroke Patients Only)       Balance                                    Cognition Arousal/Alertness: Awake/alert Behavior During Therapy: WFL for tasks assessed/performed Overall Cognitive Status: Within Functional Limits for tasks assessed                      Exercises       General Comments        Pertinent Vitals/Pain Pain Assessment: 0-10 Pain Score: 3  Pain Location: R knee and R foot Pain Descriptors / Indicators: Sore Pain Intervention(s): Monitored during session    Home Living                      Prior Function            PT Goals (current goals can now be found in the care plan section) Acute Rehab PT Goals Patient Stated Goal: go home PT Goal Formulation: With patient/family Time For Goal Achievement: 12/31/14 Potential to Achieve Goals: Good Progress towards PT goals: Progressing toward goals    Frequency  7X/week    PT Plan Current plan remains appropriate    Co-evaluation             End of Session Equipment Utilized During Treatment: Gait belt Activity Tolerance: Patient tolerated treatment well Patient left: in bed;with call bell/phone within reach;with family/visitor present     Time: 4562-5638 PT Time Calculation (min) (ACUTE ONLY): 13 min  Charges:  $Gait Training: 8-22 mins                    G Codes:  Lorriane Shire 12/19/2014, 11:23 AM

## 2014-12-19 NOTE — Discharge Summary (Signed)
Physician Discharge Summary  Patient ID: Guy Morris MRN: [id number] DOB/AGE: Jun 16, 1950 64 y.o.  Admit date: 12/17/2014 Discharge date: 12/19/2014  Admission Diagnoses:  <principal problem not specified>  Discharge Diagnoses:  Active Problems:   Primary localized osteoarthritis of right knee   Past Medical History  Diagnosis Date  . RLS (restless legs syndrome) 10/17/2010  . Degenerative joint disease 04/23/2011  . HYPOTHYROIDISM 05/06/2007    Qualifier: Diagnosis of  By: Jenny Reichmann MD, Hunt Oris   . HYPERLIPIDEMIA 05/06/2007    Qualifier: Diagnosis of  By: Jenny Reichmann MD, Hunt Oris   . ALLERGIC RHINITIS 05/06/2007    Qualifier: Diagnosis of  By: Jenny Reichmann MD, Hunt Oris   . Personal history of colonic polyps 07/20/2008    Centricity Description: PERSONAL HX COLONIC POLYPS Qualifier: Diagnosis of  By: Ardis Hughs MD, Melene Plan  Centricity Description: COLONIC POLYPS, HX OF Qualifier: Diagnosis of  By: Jenny Reichmann MD, Mora Bellman, KNEE, 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    Qualifier: Diagnosis of  By: Jenny Reichmann MD, [place] ERECTILE DYSFUNCTION 05/06/2007    Qualifier: Diagnosis of  By: Jenny Reichmann MD, [place] TRANSAMINASE-LFT'S 08/04/2008    Qualifier: Diagnosis of  By: Fuller Plan MD Marijo Conception   . Insomnia 04/28/2012  . BPH (benign prostatic hyperplasia) 04/28/2012  . Urge incontinence 04/28/2012    [facility] heps per urology  . COPD (chronic obstructive pulmonary disease)   . GERD 05/06/2007    Qualifier: Diagnosis of  By: Jenny Reichmann MD, Hunt Oris   . Acid reflux     no longer has to take medication    Surgeries: Procedure(s): TOTAL KNEE ARTHROPLASTY on 12/17/2014   Consultants (if any):    Discharged Condition: Improved  Hospital Course: AUDY DAUPHINE is an 64 y.o. male who was admitted 12/17/2014 with a diagnosis of <principal problem not specified> and went to the operating room on 12/17/2014 and underwent the above named procedures.    He was given perioperative antibiotics:  Anti-infectives    Start      Dose/Rate Route Frequency Ordered Stop   12/17/14 1045  ceFAZolin (ANCEF) IVPB 1 g/50 mL premix     1 g 100 mL/hr over 30 Minutes Intravenous Every 6 hours 12/17/14 1032 12/17/14 2244   12/17/14 0558  ceFAZolin (ANCEF) 2-3 GM-% IVPB SOLR    Comments:  Ardine Eng   : cabinet override      12/17/14 0558 12/17/14 1814   12/17/14 0556  ceFAZolin (ANCEF) IVPB 2 g/50 mL premix     2 g 100 mL/hr over 30 Minutes Intravenous On call to O.R. 12/17/14 7062 12/17/14 0740    .  He was given sequential compression devices, early ambulation, and Eliquis 2.5mg  BID for DVT prophylaxis.  He benefited maximally from the hospital stay and there were no complications.    Recent vital signs:  Filed Vitals:   12/19/14 0410  BP: 133/64  Pulse: 81  Temp: 98.6 F (37 C)  Resp: 18    Recent laboratory studies:  Lab Results  Component Value Date   HGB 12.8* 12/19/2014   HGB 12.5* 12/18/2014   HGB 15.8 12/06/2014   Lab Results  Component Value Date   WBC 14.1* 12/19/2014   PLT 216 12/19/2014   Lab Results  Component Value Date   INR 1.06 12/06/2014   Lab Results  Component Value Date   NA 137 12/18/2014   K 4.1 12/18/2014   CL 102 12/18/2014  CO2 29 12/18/2014   BUN <5* 12/18/2014   CREATININE 0.72 12/18/2014   GLUCOSE 127* 12/18/2014    Discharge Medications:     Medication List    STOP taking these medications        aspirin EC 81 MG tablet     HYDROcodone-acetaminophen 7.5-325 MG per tablet  Commonly known as:  NORCO      TAKE these medications        apixaban 2.5 MG Tabs tablet  Commonly known as:  ELIQUIS  Take 1 tablet (2.5 mg total) by mouth 2 (two) times daily.     levothyroxine 175 MCG tablet  Commonly known as:  SYNTHROID, LEVOTHROID  Take 1 tablet (175 mcg total) by mouth daily before breakfast.     oxyCODONE-acetaminophen 5-325 MG per tablet  Commonly known as:  ROXICET  Take 1-2 tablets by mouth every 4 (four) hours as needed for moderate pain or  severe pain.        Diagnostic Studies: No results found.  Disposition:         Follow-up Information    Follow up with CAFFREY JR,W D, MD. Schedule an appointment as soon as possible for a visit in 2 weeks.   Specialty:  Orthopedic Surgery   Contact information:   [address] [place] [address] [phone]        Signed: Gae Dry 12/19/2014, 10:49 AM Cell [phone]

## 2014-12-20 ENCOUNTER — Encounter (HOSPITAL_COMMUNITY): Payer: Self-pay | Admitting: Orthopedic Surgery

## 2014-12-20 DIAGNOSIS — G894 Chronic pain syndrome: Secondary | ICD-10-CM | POA: Diagnosis not present

## 2014-12-20 DIAGNOSIS — R42 Dizziness and giddiness: Secondary | ICD-10-CM | POA: Diagnosis not present

## 2014-12-20 DIAGNOSIS — Z471 Aftercare following joint replacement surgery: Secondary | ICD-10-CM | POA: Diagnosis not present

## 2014-12-20 DIAGNOSIS — Z96651 Presence of right artificial knee joint: Secondary | ICD-10-CM | POA: Diagnosis not present

## 2014-12-22 DIAGNOSIS — Z96651 Presence of right artificial knee joint: Secondary | ICD-10-CM | POA: Diagnosis not present

## 2014-12-22 DIAGNOSIS — R42 Dizziness and giddiness: Secondary | ICD-10-CM | POA: Diagnosis not present

## 2014-12-22 DIAGNOSIS — Z471 Aftercare following joint replacement surgery: Secondary | ICD-10-CM | POA: Diagnosis not present

## 2014-12-22 DIAGNOSIS — G894 Chronic pain syndrome: Secondary | ICD-10-CM | POA: Diagnosis not present

## 2014-12-24 DIAGNOSIS — R42 Dizziness and giddiness: Secondary | ICD-10-CM | POA: Diagnosis not present

## 2014-12-24 DIAGNOSIS — Z96651 Presence of right artificial knee joint: Secondary | ICD-10-CM | POA: Diagnosis not present

## 2014-12-24 DIAGNOSIS — G894 Chronic pain syndrome: Secondary | ICD-10-CM | POA: Diagnosis not present

## 2014-12-24 DIAGNOSIS — Z471 Aftercare following joint replacement surgery: Secondary | ICD-10-CM | POA: Diagnosis not present

## 2014-12-27 DIAGNOSIS — Z96651 Presence of right artificial knee joint: Secondary | ICD-10-CM | POA: Diagnosis not present

## 2014-12-27 DIAGNOSIS — R42 Dizziness and giddiness: Secondary | ICD-10-CM | POA: Diagnosis not present

## 2014-12-27 DIAGNOSIS — G894 Chronic pain syndrome: Secondary | ICD-10-CM | POA: Diagnosis not present

## 2014-12-27 DIAGNOSIS — M25461 Effusion, right knee: Secondary | ICD-10-CM | POA: Diagnosis not present

## 2014-12-27 DIAGNOSIS — Z471 Aftercare following joint replacement surgery: Secondary | ICD-10-CM | POA: Diagnosis not present

## 2014-12-27 DIAGNOSIS — M25561 Pain in right knee: Secondary | ICD-10-CM | POA: Diagnosis not present

## 2014-12-30 DIAGNOSIS — Z96651 Presence of right artificial knee joint: Secondary | ICD-10-CM | POA: Diagnosis not present

## 2015-01-03 DIAGNOSIS — Z96651 Presence of right artificial knee joint: Secondary | ICD-10-CM | POA: Diagnosis not present

## 2015-01-04 DIAGNOSIS — Z471 Aftercare following joint replacement surgery: Secondary | ICD-10-CM | POA: Diagnosis not present

## 2015-01-04 DIAGNOSIS — G894 Chronic pain syndrome: Secondary | ICD-10-CM | POA: Diagnosis not present

## 2015-01-04 DIAGNOSIS — Z96651 Presence of right artificial knee joint: Secondary | ICD-10-CM | POA: Diagnosis not present

## 2015-01-04 DIAGNOSIS — R42 Dizziness and giddiness: Secondary | ICD-10-CM | POA: Diagnosis not present

## 2015-01-05 DIAGNOSIS — Z96651 Presence of right artificial knee joint: Secondary | ICD-10-CM | POA: Diagnosis not present

## 2015-01-05 DIAGNOSIS — R42 Dizziness and giddiness: Secondary | ICD-10-CM | POA: Diagnosis not present

## 2015-01-05 DIAGNOSIS — G894 Chronic pain syndrome: Secondary | ICD-10-CM | POA: Diagnosis not present

## 2015-01-05 DIAGNOSIS — Z471 Aftercare following joint replacement surgery: Secondary | ICD-10-CM | POA: Diagnosis not present

## 2015-01-07 DIAGNOSIS — G894 Chronic pain syndrome: Secondary | ICD-10-CM | POA: Diagnosis not present

## 2015-01-07 DIAGNOSIS — R42 Dizziness and giddiness: Secondary | ICD-10-CM | POA: Diagnosis not present

## 2015-01-07 DIAGNOSIS — Z96651 Presence of right artificial knee joint: Secondary | ICD-10-CM | POA: Diagnosis not present

## 2015-01-07 DIAGNOSIS — Z471 Aftercare following joint replacement surgery: Secondary | ICD-10-CM | POA: Diagnosis not present

## 2015-01-10 ENCOUNTER — Ambulatory Visit (HOSPITAL_COMMUNITY): Payer: Medicare Other | Admitting: Physical Therapy

## 2015-01-10 DIAGNOSIS — R531 Weakness: Secondary | ICD-10-CM | POA: Diagnosis not present

## 2015-01-10 DIAGNOSIS — M25561 Pain in right knee: Secondary | ICD-10-CM | POA: Diagnosis not present

## 2015-01-10 DIAGNOSIS — R262 Difficulty in walking, not elsewhere classified: Secondary | ICD-10-CM | POA: Diagnosis not present

## 2015-01-10 DIAGNOSIS — M25661 Stiffness of right knee, not elsewhere classified: Secondary | ICD-10-CM | POA: Diagnosis not present

## 2015-01-12 ENCOUNTER — Encounter (HOSPITAL_COMMUNITY): Payer: Medicare Other | Admitting: Physical Therapy

## 2015-01-13 DIAGNOSIS — M25561 Pain in right knee: Secondary | ICD-10-CM | POA: Diagnosis not present

## 2015-01-13 DIAGNOSIS — R531 Weakness: Secondary | ICD-10-CM | POA: Diagnosis not present

## 2015-01-13 DIAGNOSIS — Z96651 Presence of right artificial knee joint: Secondary | ICD-10-CM | POA: Diagnosis not present

## 2015-01-13 DIAGNOSIS — M25661 Stiffness of right knee, not elsewhere classified: Secondary | ICD-10-CM | POA: Diagnosis not present

## 2015-01-13 DIAGNOSIS — R262 Difficulty in walking, not elsewhere classified: Secondary | ICD-10-CM | POA: Diagnosis not present

## 2015-01-14 ENCOUNTER — Encounter (HOSPITAL_COMMUNITY): Payer: Medicare Other

## 2015-01-17 ENCOUNTER — Encounter (HOSPITAL_COMMUNITY): Payer: Medicare Other | Admitting: Physical Therapy

## 2015-01-17 DIAGNOSIS — R262 Difficulty in walking, not elsewhere classified: Secondary | ICD-10-CM | POA: Diagnosis not present

## 2015-01-17 DIAGNOSIS — R531 Weakness: Secondary | ICD-10-CM | POA: Diagnosis not present

## 2015-01-17 DIAGNOSIS — M25561 Pain in right knee: Secondary | ICD-10-CM | POA: Diagnosis not present

## 2015-01-17 DIAGNOSIS — M25661 Stiffness of right knee, not elsewhere classified: Secondary | ICD-10-CM | POA: Diagnosis not present

## 2015-01-19 ENCOUNTER — Encounter (HOSPITAL_COMMUNITY): Payer: Medicare Other

## 2015-01-20 DIAGNOSIS — R531 Weakness: Secondary | ICD-10-CM | POA: Diagnosis not present

## 2015-01-20 DIAGNOSIS — M25661 Stiffness of right knee, not elsewhere classified: Secondary | ICD-10-CM | POA: Diagnosis not present

## 2015-01-20 DIAGNOSIS — R262 Difficulty in walking, not elsewhere classified: Secondary | ICD-10-CM | POA: Diagnosis not present

## 2015-01-20 DIAGNOSIS — M25561 Pain in right knee: Secondary | ICD-10-CM | POA: Diagnosis not present

## 2015-01-21 ENCOUNTER — Encounter (HOSPITAL_COMMUNITY): Payer: Medicare Other | Admitting: Physical Therapy

## 2015-01-24 ENCOUNTER — Encounter (HOSPITAL_COMMUNITY): Payer: Medicare Other | Admitting: Physical Therapy

## 2015-01-24 DIAGNOSIS — R531 Weakness: Secondary | ICD-10-CM | POA: Diagnosis not present

## 2015-01-24 DIAGNOSIS — M25661 Stiffness of right knee, not elsewhere classified: Secondary | ICD-10-CM | POA: Diagnosis not present

## 2015-01-24 DIAGNOSIS — M25561 Pain in right knee: Secondary | ICD-10-CM | POA: Diagnosis not present

## 2015-01-24 DIAGNOSIS — R262 Difficulty in walking, not elsewhere classified: Secondary | ICD-10-CM | POA: Diagnosis not present

## 2015-01-26 ENCOUNTER — Encounter (HOSPITAL_COMMUNITY): Payer: Medicare Other | Admitting: Physical Therapy

## 2015-01-27 DIAGNOSIS — M25661 Stiffness of right knee, not elsewhere classified: Secondary | ICD-10-CM | POA: Diagnosis not present

## 2015-01-27 DIAGNOSIS — Z96651 Presence of right artificial knee joint: Secondary | ICD-10-CM | POA: Diagnosis not present

## 2015-01-27 DIAGNOSIS — R531 Weakness: Secondary | ICD-10-CM | POA: Diagnosis not present

## 2015-01-27 DIAGNOSIS — M25561 Pain in right knee: Secondary | ICD-10-CM | POA: Diagnosis not present

## 2015-01-27 DIAGNOSIS — R262 Difficulty in walking, not elsewhere classified: Secondary | ICD-10-CM | POA: Diagnosis not present

## 2015-01-28 ENCOUNTER — Encounter (HOSPITAL_COMMUNITY): Payer: Medicare Other

## 2015-01-31 ENCOUNTER — Encounter (HOSPITAL_COMMUNITY): Payer: Medicare Other | Admitting: Physical Therapy

## 2015-01-31 DIAGNOSIS — R531 Weakness: Secondary | ICD-10-CM | POA: Diagnosis not present

## 2015-01-31 DIAGNOSIS — M25561 Pain in right knee: Secondary | ICD-10-CM | POA: Diagnosis not present

## 2015-01-31 DIAGNOSIS — R262 Difficulty in walking, not elsewhere classified: Secondary | ICD-10-CM | POA: Diagnosis not present

## 2015-01-31 DIAGNOSIS — M25661 Stiffness of right knee, not elsewhere classified: Secondary | ICD-10-CM | POA: Diagnosis not present

## 2015-02-02 ENCOUNTER — Encounter (HOSPITAL_COMMUNITY): Payer: Medicare Other

## 2015-02-03 DIAGNOSIS — R531 Weakness: Secondary | ICD-10-CM | POA: Diagnosis not present

## 2015-02-03 DIAGNOSIS — M25561 Pain in right knee: Secondary | ICD-10-CM | POA: Diagnosis not present

## 2015-02-03 DIAGNOSIS — M25661 Stiffness of right knee, not elsewhere classified: Secondary | ICD-10-CM | POA: Diagnosis not present

## 2015-02-03 DIAGNOSIS — R262 Difficulty in walking, not elsewhere classified: Secondary | ICD-10-CM | POA: Diagnosis not present

## 2015-02-04 ENCOUNTER — Encounter (HOSPITAL_COMMUNITY): Payer: Medicare Other | Admitting: Physical Therapy

## 2015-02-07 ENCOUNTER — Encounter (HOSPITAL_COMMUNITY): Payer: Medicare Other | Admitting: Physical Therapy

## 2015-02-07 DIAGNOSIS — R262 Difficulty in walking, not elsewhere classified: Secondary | ICD-10-CM | POA: Diagnosis not present

## 2015-02-07 DIAGNOSIS — M25661 Stiffness of right knee, not elsewhere classified: Secondary | ICD-10-CM | POA: Diagnosis not present

## 2015-02-07 DIAGNOSIS — R531 Weakness: Secondary | ICD-10-CM | POA: Diagnosis not present

## 2015-02-07 DIAGNOSIS — M25561 Pain in right knee: Secondary | ICD-10-CM | POA: Diagnosis not present

## 2015-02-09 ENCOUNTER — Encounter (HOSPITAL_COMMUNITY): Payer: Medicare Other | Admitting: Physical Therapy

## 2015-02-10 DIAGNOSIS — R262 Difficulty in walking, not elsewhere classified: Secondary | ICD-10-CM | POA: Diagnosis not present

## 2015-02-10 DIAGNOSIS — R531 Weakness: Secondary | ICD-10-CM | POA: Diagnosis not present

## 2015-02-10 DIAGNOSIS — M25661 Stiffness of right knee, not elsewhere classified: Secondary | ICD-10-CM | POA: Diagnosis not present

## 2015-02-10 DIAGNOSIS — M25561 Pain in right knee: Secondary | ICD-10-CM | POA: Diagnosis not present

## 2015-02-15 DIAGNOSIS — R531 Weakness: Secondary | ICD-10-CM | POA: Diagnosis not present

## 2015-02-15 DIAGNOSIS — M25561 Pain in right knee: Secondary | ICD-10-CM | POA: Diagnosis not present

## 2015-02-15 DIAGNOSIS — M25661 Stiffness of right knee, not elsewhere classified: Secondary | ICD-10-CM | POA: Diagnosis not present

## 2015-02-15 DIAGNOSIS — R262 Difficulty in walking, not elsewhere classified: Secondary | ICD-10-CM | POA: Diagnosis not present

## 2015-02-17 DIAGNOSIS — R262 Difficulty in walking, not elsewhere classified: Secondary | ICD-10-CM | POA: Diagnosis not present

## 2015-02-17 DIAGNOSIS — M25661 Stiffness of right knee, not elsewhere classified: Secondary | ICD-10-CM | POA: Diagnosis not present

## 2015-02-17 DIAGNOSIS — R531 Weakness: Secondary | ICD-10-CM | POA: Diagnosis not present

## 2015-02-17 DIAGNOSIS — M25561 Pain in right knee: Secondary | ICD-10-CM | POA: Diagnosis not present

## 2015-02-21 DIAGNOSIS — M25561 Pain in right knee: Secondary | ICD-10-CM | POA: Diagnosis not present

## 2015-02-21 DIAGNOSIS — M25562 Pain in left knee: Secondary | ICD-10-CM | POA: Diagnosis not present

## 2015-02-22 DIAGNOSIS — M25561 Pain in right knee: Secondary | ICD-10-CM | POA: Diagnosis not present

## 2015-02-22 DIAGNOSIS — R262 Difficulty in walking, not elsewhere classified: Secondary | ICD-10-CM | POA: Diagnosis not present

## 2015-02-22 DIAGNOSIS — M25661 Stiffness of right knee, not elsewhere classified: Secondary | ICD-10-CM | POA: Diagnosis not present

## 2015-02-22 DIAGNOSIS — R531 Weakness: Secondary | ICD-10-CM | POA: Diagnosis not present

## 2015-02-24 DIAGNOSIS — R531 Weakness: Secondary | ICD-10-CM | POA: Diagnosis not present

## 2015-02-24 DIAGNOSIS — R262 Difficulty in walking, not elsewhere classified: Secondary | ICD-10-CM | POA: Diagnosis not present

## 2015-02-24 DIAGNOSIS — M25661 Stiffness of right knee, not elsewhere classified: Secondary | ICD-10-CM | POA: Diagnosis not present

## 2015-02-24 DIAGNOSIS — M25561 Pain in right knee: Secondary | ICD-10-CM | POA: Diagnosis not present

## 2015-02-28 DIAGNOSIS — R262 Difficulty in walking, not elsewhere classified: Secondary | ICD-10-CM | POA: Diagnosis not present

## 2015-02-28 DIAGNOSIS — R531 Weakness: Secondary | ICD-10-CM | POA: Diagnosis not present

## 2015-02-28 DIAGNOSIS — M25561 Pain in right knee: Secondary | ICD-10-CM | POA: Diagnosis not present

## 2015-02-28 DIAGNOSIS — M25661 Stiffness of right knee, not elsewhere classified: Secondary | ICD-10-CM | POA: Diagnosis not present

## 2015-03-03 DIAGNOSIS — R262 Difficulty in walking, not elsewhere classified: Secondary | ICD-10-CM | POA: Diagnosis not present

## 2015-03-03 DIAGNOSIS — M25661 Stiffness of right knee, not elsewhere classified: Secondary | ICD-10-CM | POA: Diagnosis not present

## 2015-03-03 DIAGNOSIS — M25561 Pain in right knee: Secondary | ICD-10-CM | POA: Diagnosis not present

## 2015-03-03 DIAGNOSIS — R531 Weakness: Secondary | ICD-10-CM | POA: Diagnosis not present

## 2015-03-07 DIAGNOSIS — R531 Weakness: Secondary | ICD-10-CM | POA: Diagnosis not present

## 2015-03-07 DIAGNOSIS — M25661 Stiffness of right knee, not elsewhere classified: Secondary | ICD-10-CM | POA: Diagnosis not present

## 2015-03-07 DIAGNOSIS — M25561 Pain in right knee: Secondary | ICD-10-CM | POA: Diagnosis not present

## 2015-03-07 DIAGNOSIS — R262 Difficulty in walking, not elsewhere classified: Secondary | ICD-10-CM | POA: Diagnosis not present

## 2015-03-10 DIAGNOSIS — R262 Difficulty in walking, not elsewhere classified: Secondary | ICD-10-CM | POA: Diagnosis not present

## 2015-03-10 DIAGNOSIS — M25661 Stiffness of right knee, not elsewhere classified: Secondary | ICD-10-CM | POA: Diagnosis not present

## 2015-03-10 DIAGNOSIS — M25561 Pain in right knee: Secondary | ICD-10-CM | POA: Diagnosis not present

## 2015-03-10 DIAGNOSIS — R531 Weakness: Secondary | ICD-10-CM | POA: Diagnosis not present

## 2015-03-14 DIAGNOSIS — R262 Difficulty in walking, not elsewhere classified: Secondary | ICD-10-CM | POA: Diagnosis not present

## 2015-03-14 DIAGNOSIS — M25661 Stiffness of right knee, not elsewhere classified: Secondary | ICD-10-CM | POA: Diagnosis not present

## 2015-03-14 DIAGNOSIS — M25561 Pain in right knee: Secondary | ICD-10-CM | POA: Diagnosis not present

## 2015-03-14 DIAGNOSIS — R531 Weakness: Secondary | ICD-10-CM | POA: Diagnosis not present

## 2015-03-17 DIAGNOSIS — R262 Difficulty in walking, not elsewhere classified: Secondary | ICD-10-CM | POA: Diagnosis not present

## 2015-03-17 DIAGNOSIS — M25561 Pain in right knee: Secondary | ICD-10-CM | POA: Diagnosis not present

## 2015-03-17 DIAGNOSIS — M25661 Stiffness of right knee, not elsewhere classified: Secondary | ICD-10-CM | POA: Diagnosis not present

## 2015-03-17 DIAGNOSIS — R531 Weakness: Secondary | ICD-10-CM | POA: Diagnosis not present

## 2015-03-21 DIAGNOSIS — M25562 Pain in left knee: Secondary | ICD-10-CM | POA: Diagnosis not present

## 2015-03-21 DIAGNOSIS — M25561 Pain in right knee: Secondary | ICD-10-CM | POA: Diagnosis not present

## 2015-04-18 DIAGNOSIS — M25561 Pain in right knee: Secondary | ICD-10-CM | POA: Diagnosis not present

## 2015-05-03 ENCOUNTER — Encounter: Payer: Self-pay | Admitting: Internal Medicine

## 2015-05-03 ENCOUNTER — Ambulatory Visit (INDEPENDENT_AMBULATORY_CARE_PROVIDER_SITE_OTHER): Payer: Medicare Other | Admitting: Internal Medicine

## 2015-05-03 VITALS — BP 128/76 | HR 51 | Temp 98.8°F | Ht 68.0 in | Wt 172.0 lb

## 2015-05-03 DIAGNOSIS — J309 Allergic rhinitis, unspecified: Secondary | ICD-10-CM

## 2015-05-03 DIAGNOSIS — G2581 Restless legs syndrome: Secondary | ICD-10-CM | POA: Diagnosis not present

## 2015-05-03 DIAGNOSIS — E785 Hyperlipidemia, unspecified: Secondary | ICD-10-CM

## 2015-05-03 DIAGNOSIS — Z23 Encounter for immunization: Secondary | ICD-10-CM | POA: Diagnosis not present

## 2015-05-03 MED ORDER — GABAPENTIN (ONCE-DAILY) 300 MG PO TABS: ORAL_TABLET | ORAL | Status: DC

## 2015-05-03 NOTE — Assessment & Plan Note (Signed)
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

## 2015-05-03 NOTE — Patient Instructions (Signed)
You had the flu shot today  Please take all new medication as prescribed - the gabapentin  Please also try OTC Zaditor for eye allergies, and call for Optivar if not working well enough  Please continue all other medications as before, and refills have been done if requested.  Please have the pharmacy call with any other refills you may need.  Please continue your efforts at being more active, low cholesterol diet, and weight control.  Please keep your appointments with your specialists as you may have planned  Please return in 6 months, or sooner if needed

## 2015-05-03 NOTE — Assessment & Plan Note (Signed)
stable overall by history and exam, recent data reviewed with pt, and pt to continue medical treatment as before,  to f/u any worsening symptoms or concerns Lab Results  Component Value Date   LDLCALC 174* 06/25/2014   Reminded pt regarding diet, as been statin intolerant

## 2015-05-03 NOTE — Assessment & Plan Note (Signed)
Overall mild with eye involvement as well - for otc zaditor to start, consider optivar if not improved well enough

## 2015-05-03 NOTE — Progress Notes (Signed)
Pre visit review using our clinic review tool, if applicable. No additional management support is needed unless otherwise documented below in the visit note. 
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
## 2015-05-16 DIAGNOSIS — M25561 Pain in right knee: Secondary | ICD-10-CM | POA: Diagnosis not present

## 2015-06-16 ENCOUNTER — Ambulatory Visit (INDEPENDENT_AMBULATORY_CARE_PROVIDER_SITE_OTHER): Payer: Medicare Other | Admitting: Family

## 2015-06-16 ENCOUNTER — Encounter: Payer: Self-pay | Admitting: Family

## 2015-06-16 VITALS — BP 108/72 | HR 70 | Temp 98.0°F | Resp 14 | Ht 67.0 in | Wt 174.0 lb

## 2015-06-16 DIAGNOSIS — J019 Acute sinusitis, unspecified: Secondary | ICD-10-CM | POA: Insufficient documentation

## 2015-06-16 DIAGNOSIS — J0100 Acute maxillary sinusitis, unspecified: Secondary | ICD-10-CM | POA: Diagnosis not present

## 2015-06-16 MED ORDER — METHYLPREDNISOLONE ACETATE 80 MG/ML IJ SUSP
80.0000 mg | Freq: Once | INTRAMUSCULAR | Status: AC
  Administered 2015-06-16: 80 mg via INTRAMUSCULAR

## 2015-06-16 MED ORDER — HYDROCODONE-HOMATROPINE 5-1.5 MG/5ML PO SYRP
5.0000 mL | ORAL_SOLUTION | Freq: Three times a day (TID) | ORAL | Status: DC | PRN
Start: 2015-06-16 — End: 2015-11-01

## 2015-06-16 MED ORDER — POLYMYXIN B-TRIMETHOPRIM 10000-0.1 UNIT/ML-% OP SOLN: 1.0000 [drp] | Freq: Four times a day (QID) | OPHTHALMIC | Status: DC

## 2015-06-16 MED ORDER — AMOXICILLIN-POT CLAVULANATE 875-125 MG PO TABS: 1.0000 | ORAL_TABLET | Freq: Two times a day (BID) | ORAL | Status: DC

## 2015-06-16 NOTE — Assessment & Plan Note (Signed)
Symptoms and exam consistent with acute bacterial sinusitis. Start Augmentin. Start Hycodan as needed for cough and sleep. In office injection Depo-Medrol provided. Continue over-the-counter medications as needed for symptom relief and supportive care. Follow-up if symptoms worsen or fail to improve.

## 2015-06-16 NOTE — Patient Instructions (Signed)
Thank you for choosing [facility].  Summary/Instructions:  Your prescription(s) have been submitted to your pharmacy or been printed and provided for you. Please take as directed and contact our office if you believe you are having problem(s) with the medication(s) or have any questions.  If your symptoms worsen or fail to improve, please contact our office for further instruction, or in case of emergency go directly to the emergency room at the closest medical facility.   General Recommendations:    Please drink plenty of fluids.  Get plenty of rest   Sleep in humidified air  Use saline nasal sprays  Netti pot   OTC Medications:  Decongestants - helps relieve congestion   Flonase (generic fluticasone) or Nasacort (generic triamcinolone) - please make sure to use the "cross-over" technique at a 45 degree angle towards the opposite eye as opposed to straight up the nasal passageway.   Sudafed (generic pseudoephedrine - Note this is the one that is available behind the pharmacy counter); Products with phenylephrine (-PE) may also be used but is often not as effective as pseudoephedrine.   If you have HIGH BLOOD PRESSURE - Coricidin HBP; AVOID any product that is -D as this contains pseudoephedrine which may increase your blood pressure.  Afrin (oxymetazoline) every 6-8 hours for up to 3 days.   Allergies - helps relieve runny nose, itchy eyes and sneezing   Claritin (generic loratidine), Allegra (fexofenidine), or Zyrtec (generic cyrterizine) for runny nose. These medications should not cause drowsiness.  Note - Benadryl (generic diphenhydramine) may be used however may cause drowsiness  Cough -   Delsym or Robitussin (generic dextromethorphan)  Expectorants - helps loosen mucus to ease removal   Mucinex (generic guaifenesin) as directed on the package.  Headaches / General Aches   Tylenol (generic acetaminophen) - DO NOT EXCEED 3 grams (3,000 mg) in a 24  hour time period  Advil/Motrin (generic ibuprofen)   Sore Throat -   Salt water gargle   Chloraseptic (generic benzocaine) spray or lozenges / Sucrets (generic dyclonine)    Sinusitis Sinusitis is redness, soreness, and inflammation of the paranasal sinuses. Paranasal sinuses are air pockets within the bones of your face (beneath the eyes, the middle of the forehead, or above the eyes). In healthy paranasal sinuses, mucus is able to drain out, and air is able to circulate through them by way of your nose. However, when your paranasal sinuses are inflamed, mucus and air can become trapped. This can allow bacteria and other germs to grow and cause infection. Sinusitis can develop quickly and last only a short time (acute) or continue over a long period (chronic). Sinusitis that lasts for more than 12 weeks is considered chronic.  CAUSES  Causes of sinusitis include:  Allergies.  Structural abnormalities, such as displacement of the cartilage that separates your nostrils (deviated septum), which can decrease the air flow through your nose and sinuses and affect sinus drainage.  Functional abnormalities, such as when the small hairs (cilia) that line your sinuses and help remove mucus do not work properly or are not present. SIGNS AND SYMPTOMS  Symptoms of acute and chronic sinusitis are the same. The primary symptoms are pain and pressure around the affected sinuses. Other symptoms include:  Upper toothache.  Earache.  Headache.  Bad breath.  Decreased sense of smell and taste.  A cough, which worsens when you are lying flat.  Fatigue.  Fever.  Thick drainage from your nose, which often is green and may   contain pus (purulent).  Swelling and warmth over the affected sinuses. DIAGNOSIS  Your health care provider will perform a physical exam. During the exam, your health care provider may:  Look in your nose for signs of abnormal growths in your nostrils (nasal  polyps).  Tap over the affected sinus to check for signs of infection.  View the inside of your sinuses (endoscopy) using an imaging device that has a light attached (endoscope). If your health care provider suspects that you have chronic sinusitis, one or more of the following tests may be recommended:  Allergy tests.  Nasal culture. A sample of mucus is taken from your nose, sent to a lab, and screened for bacteria.  Nasal cytology. A sample of mucus is taken from your nose and examined by your health care provider to determine if your sinusitis is related to an allergy. TREATMENT  Most cases of acute sinusitis are related to a viral infection and will resolve on their own within 10 days. Sometimes medicines are prescribed to help relieve symptoms (pain medicine, decongestants, nasal steroid sprays, or saline sprays).  However, for sinusitis related to a bacterial infection, your health care provider will prescribe antibiotic medicines. These are medicines that will help kill the bacteria causing the infection.  Rarely, sinusitis is caused by a fungal infection. In theses cases, your health care provider will prescribe antifungal medicine. For some cases of chronic sinusitis, surgery is needed. Generally, these are cases in which sinusitis recurs more than 3 times per year, despite other treatments. HOME CARE INSTRUCTIONS   Drink plenty of water. Water helps thin the mucus so your sinuses can drain more easily.  Use a humidifier.  Inhale steam 3 to 4 times a day (for example, sit in the bathroom with the shower running).  Apply a warm, moist washcloth to your face 3 to 4 times a day, or as directed by your health care provider.  Use saline nasal sprays to help moisten and clean your sinuses.  Take medicines only as directed by your health care provider.  If you were prescribed either an antibiotic or antifungal medicine, finish it all even if you start to feel better. SEEK IMMEDIATE  MEDICAL CARE IF:  You have increasing pain or severe headaches.  You have nausea, vomiting, or drowsiness.  You have swelling around your face.  You have vision problems.  You have a stiff neck.  You have difficulty breathing. MAKE SURE YOU:   Understand these instructions.  Will watch your condition.  Will get help right away if you are not doing well or get worse. Document Released: 05/28/2005 Document Revised: 10/12/2013 Document Reviewed: 06/12/2011 ExitCare Patient Information 2015 [employer]. This information is not intended to replace advice given to you by your health care provider. Make sure you discuss any questions you have with your health care provider.   

## 2015-06-16 NOTE — Progress Notes (Signed)
Pre visit review using our clinic review tool, if applicable. No additional management support is needed unless otherwise documented below in the visit note. 

## 2015-06-16 NOTE — Progress Notes (Signed)
Subjective:    Patient ID: Guy Morris, male    DOB: 09/19/1950, 65 y.o.   MRN: [id number]  Chief Complaint  Patient presents with  . Cough    cough, sinus pressure and headache, eyes swollen and in pain, sore throat    HPI:  Guy Morris is a 65 y.o. male who  has a past medical history of RLS (restless legs syndrome) (10/17/2010); Degenerative joint disease (04/23/2011); HYPOTHYROIDISM (05/06/2007); HYPERLIPIDEMIA (05/06/2007); ALLERGIC RHINITIS (05/06/2007); Personal history of colonic polyps (07/20/2008); OSTEOARTHRITIS, KNEE, RIGHT (05/06/2007); ERECTILE DYSFUNCTION (05/06/2007); ABNORMAL TRANSAMINASE-LFT'S (08/04/2008); Insomnia (04/28/2012); BPH (benign prostatic hyperplasia) (04/28/2012); Urge incontinence (04/28/2012); COPD (chronic obstructive pulmonary disease) ([place]); GERD (05/06/2007); and Acid reflux. and presents today for an acute office visit.  This is a new problem. Associated symptoms of cough, sinus pressure, headache, sore throat, and swollen eyes has been going on for approximately 1 week. Modifying factors include eye drops for his eyes and Nyquil at night to help with symptoms which did help him sleep some. Course of the symptoms has stayed about the same since initial onset. Denies fevers. Denies any recent antibiotic use. the  Allergies  Allergen Reactions  . Atorvastatin     REACTION: myalgia  . Diphenhydramine Hcl     REACTION: easy bruising per pt  . Rofecoxib     REACTION: itch  . Rosuvastatin     REACTION: myalgia  . Zocor [Simvastatin] Other (See Comments)    myalgia     Current Outpatient Prescriptions on File Prior to Visit  Medication Sig Dispense Refill  . Gabapentin, Once-Daily, 300 MG TABS 1-2 by mouth at night as needed 180 tablet 1  . levothyroxine (SYNTHROID, LEVOTHROID) 175 MCG tablet Take 1 tablet (175 mcg total) by mouth daily before breakfast. 90 tablet 3  . oxyCODONE-acetaminophen (ROXICET) 5-325 MG per tablet Take 1-2 tablets by mouth  every 4 (four) hours as needed for moderate pain or severe pain. 100 tablet 0   No current facility-administered medications on file prior to visit.    Review of Systems  HENT: Positive for congestion, sinus pressure and sore throat.   Respiratory: Positive for cough. Negative for chest tightness and shortness of breath.   Neurological: Positive for headaches.      Objective:    BP 108/72 mmHg  Pulse 70  Temp(Src) 98 F (36.7 C) (Oral)  Resp 14  Ht 5\' 7"  (1.702 m)  Wt 174 lb (78.926 kg)  BMI 27.25 kg/m2  SpO2 97% Nursing note and vital signs reviewed.  Physical Exam  Constitutional: He is oriented to person, place, and time. He appears well-developed and well-nourished. No distress.  HENT:  Right Ear: Hearing, tympanic membrane, external ear and ear canal normal.  Left Ear: Hearing, tympanic membrane, external ear and ear canal normal.  Nose: Right sinus exhibits maxillary sinus tenderness and frontal sinus tenderness. Left sinus exhibits maxillary sinus tenderness and frontal sinus tenderness.  Mouth/Throat: Uvula is midline, oropharynx is clear and moist and mucous membranes are normal.  Neck: Neck supple.  Cardiovascular: Normal rate, regular rhythm, normal heart sounds and intact distal pulses.   Pulmonary/Chest: Effort normal and breath sounds normal.  Neurological: He is alert and oriented to person, place, and time.  Skin: Skin is warm and dry.  Psychiatric: He has a normal mood and affect. His behavior is normal. Judgment and thought content normal.       Assessment & Plan:   Problem List Items Addressed This Visit  Respiratory   Sinusitis, acute - Primary    Symptoms and exam consistent with acute bacterial sinusitis. Start Augmentin. Start Hycodan as needed for cough and sleep. In office injection Depo-Medrol provided. Continue over-the-counter medications as needed for symptom relief and supportive care. Follow-up if symptoms worsen or fail to improve.        Relevant Medications   HYDROcodone-homatropine (HYCODAN) 5-1.5 MG/5ML syrup   amoxicillin-clavulanate (AUGMENTIN) 875-125 MG tablet   methylPREDNISolone acetate (DEPO-MEDROL) injection 80 mg (Completed)

## 2015-11-01 ENCOUNTER — Ambulatory Visit (INDEPENDENT_AMBULATORY_CARE_PROVIDER_SITE_OTHER)
Admission: RE | Admit: 2015-11-01 | Discharge: 2015-11-01 | Disposition: A | Discharge status: Home / Self Care | Payer: Medicare Other | Source: Ambulatory Visit | Attending: Internal Medicine | Admitting: Internal Medicine

## 2015-11-01 ENCOUNTER — Other Ambulatory Visit (INDEPENDENT_AMBULATORY_CARE_PROVIDER_SITE_OTHER): Payer: Medicare Other

## 2015-11-01 ENCOUNTER — Encounter: Payer: Self-pay | Admitting: Internal Medicine

## 2015-11-01 ENCOUNTER — Ambulatory Visit (INDEPENDENT_AMBULATORY_CARE_PROVIDER_SITE_OTHER): Payer: Medicare Other | Admitting: Internal Medicine

## 2015-11-01 ENCOUNTER — Telehealth: Payer: Self-pay

## 2015-11-01 VITALS — BP 140/84 | HR 57 | Temp 98.4°F | Resp 20 | Wt 177.0 lb

## 2015-11-01 DIAGNOSIS — R0602 Shortness of breath: Secondary | ICD-10-CM | POA: Diagnosis not present

## 2015-11-01 DIAGNOSIS — R079 Chest pain, unspecified: Secondary | ICD-10-CM

## 2015-11-01 DIAGNOSIS — R0600 Dyspnea, unspecified: Secondary | ICD-10-CM

## 2015-11-01 DIAGNOSIS — N320 Bladder-neck obstruction: Secondary | ICD-10-CM

## 2015-11-01 DIAGNOSIS — R7989 Other specified abnormal findings of blood chemistry: Secondary | ICD-10-CM

## 2015-11-01 DIAGNOSIS — E039 Hypothyroidism, unspecified: Secondary | ICD-10-CM

## 2015-11-01 DIAGNOSIS — E785 Hyperlipidemia, unspecified: Secondary | ICD-10-CM

## 2015-11-01 DIAGNOSIS — J439 Emphysema, unspecified: Secondary | ICD-10-CM

## 2015-11-01 DIAGNOSIS — J449 Chronic obstructive pulmonary disease, unspecified: Secondary | ICD-10-CM | POA: Insufficient documentation

## 2015-11-01 DIAGNOSIS — Z1159 Encounter for screening for other viral diseases: Secondary | ICD-10-CM | POA: Diagnosis not present

## 2015-11-01 DIAGNOSIS — G894 Chronic pain syndrome: Secondary | ICD-10-CM

## 2015-11-01 LAB — URINALYSIS, ROUTINE W REFLEX MICROSCOPIC
Bilirubin Urine: NEGATIVE
Hgb urine dipstick: NEGATIVE
Ketones, ur: NEGATIVE
Leukocytes, UA: NEGATIVE
Nitrite: NEGATIVE
RBC / HPF: NONE SEEN (ref 0–?)
Specific Gravity, Urine: <=1.005 — AB (ref 1.000–1.030)
Total Protein, Urine: NEGATIVE
Urine Glucose: NEGATIVE
Urobilinogen, UA: 0.2 (ref 0.0–1.0)
pH: 5.5 (ref 5.0–8.0)

## 2015-11-01 LAB — HEPATIC FUNCTION PANEL
ALT: 13 U/L (ref 0–53)
AST: 15 U/L (ref 0–37)
Albumin: 4.5 g/dL (ref 3.5–5.2)
Alkaline Phosphatase: 77 U/L (ref 39–117)
Bilirubin, Direct: 0.1 mg/dL (ref 0.0–0.3)
Total Bilirubin: 0.5 mg/dL (ref 0.2–1.2)
Total Protein: 6.7 g/dL (ref 6.0–8.3)

## 2015-11-01 LAB — CBC WITH DIFFERENTIAL/PLATELET
Basophils Absolute: 0 10*3/uL (ref 0.0–0.1)
Basophils Relative: 0.7 % (ref 0.0–3.0)
Eosinophils Absolute: 0.1 10*3/uL (ref 0.0–0.7)
Eosinophils Relative: 1.4 % (ref 0.0–5.0)
HCT: 47.1 % (ref 39.0–52.0)
Hemoglobin: 15.8 g/dL (ref 13.0–17.0)
Lymphocytes Relative: 37.5 % (ref 12.0–46.0)
Lymphs Abs: 2.7 10*3/uL (ref 0.7–4.0)
MCHC: 33.6 g/dL (ref 30.0–36.0)
MCV: 90.7 fl (ref 78.0–100.0)
Monocytes Absolute: 0.7 10*3/uL (ref 0.1–1.0)
Monocytes Relative: 10.3 % (ref 3.0–12.0)
Neutro Abs: 3.5 10*3/uL (ref 1.4–7.7)
Neutrophils Relative %: 50.1 % (ref 43.0–77.0)
Platelets: 276 10*3/uL (ref 150.0–400.0)
RBC: 5.2 Mil/uL (ref 4.22–5.81)
RDW: 12.9 % (ref 11.5–15.5)
WBC: 7.1 10*3/uL (ref 4.0–10.5)

## 2015-11-01 LAB — LIPID PANEL
Cholesterol: 218 mg/dL — ABNORMAL HIGH (ref 0–200)
HDL: 36.5 mg/dL — ABNORMAL LOW (ref >39.00)
NonHDL: 181.52
Total CHOL/HDL Ratio: 6
Triglycerides: 221 mg/dL — ABNORMAL HIGH (ref 0.0–149.0)
VLDL: 44.2 mg/dL — ABNORMAL HIGH (ref 0.0–40.0)

## 2015-11-01 LAB — BASIC METABOLIC PANEL
BUN: 9 mg/dL (ref 6–23)
CO2: 27 mEq/L (ref 19–32)
Calcium: 10 mg/dL (ref 8.4–10.5)
Chloride: 105 mEq/L (ref 96–112)
Creatinine, Ser: 0.75 mg/dL (ref 0.40–1.50)
GFR: 111.41 mL/min (ref >60.00)
Glucose, Bld: 95 mg/dL (ref 70–99)
Potassium: 4.1 mEq/L (ref 3.5–5.1)
Sodium: 137 mEq/L (ref 135–145)

## 2015-11-01 LAB — LDL CHOLESTEROL, DIRECT: Direct LDL: 142 mg/dL

## 2015-11-01 LAB — PSA: PSA: 0.9 ng/mL (ref 0.10–4.00)

## 2015-11-01 LAB — TSH: TSH: 0.27 u[IU]/mL — ABNORMAL LOW (ref 0.35–4.50)

## 2015-11-01 MED ORDER — OXYCODONE-ACETAMINOPHEN 5-325 MG PO TABS: 1.0000 | ORAL_TABLET | ORAL | Status: DC | PRN

## 2015-11-01 MED ORDER — ASPIRIN EC 81 MG PO TBEC: 81.0000 mg | DELAYED_RELEASE_TABLET | Freq: Every day | ORAL | Status: DC

## 2015-11-01 MED ORDER — LEVOTHYROXINE SODIUM 175 MCG PO TABS: 175.0000 ug | ORAL_TABLET | Freq: Every day | ORAL | Status: DC

## 2015-11-01 MED ORDER — ALBUTEROL SULFATE HFA 108 (90 BASE) MCG/ACT IN AERS: 2.0000 | INHALATION_SPRAY | Freq: Four times a day (QID) | RESPIRATORY_TRACT | Status: DC | PRN

## 2015-11-01 MED ORDER — TIOTROPIUM BROMIDE MONOHYDRATE 18 MCG IN CAPS: 18.0000 ug | ORAL_CAPSULE | Freq: Every day | RESPIRATORY_TRACT | Status: DC

## 2015-11-01 NOTE — Patient Instructions (Addendum)
Your EKG was OK today  Please start Aspirin 81 mg - 1 per day - coated only (OTC)  Please take all new medication as prescribed - the albuterol and Spiriva inhalers  Please continue all other medications as before, and refills have been done if requested.  Please have the pharmacy call with any other refills you may need.  Please continue your efforts at being more active, low cholesterol diet, and weight control.  You are otherwise up to date with prevention measures today.  Please keep your appointments with your specialists as you may have planned  You will be contacted regarding the referral for: Stress test  Please go to the XRAY Department in the Basement (go straight as you get off the elevator) for the x-ray testing  Please go to the LAB in the Basement (turn left off the elevator) for the tests to be done today  You will be contacted by phone if any changes need to be made immediately.  Otherwise, you will receive a letter about your results with an explanation, but please check with MyChart first.  Please remember to sign up for MyChart if you have not done so, as this will be important to you in the future with finding out test results, communicating by private email, and scheduling acute appointments online when needed.  Please return in 6 months, or sooner if needed

## 2015-11-01 NOTE — Progress Notes (Signed)
Pre visit review using our clinic review tool, if applicable. No additional management support is needed unless otherwise documented below in the visit note. 
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
## 2015-11-01 NOTE — Telephone Encounter (Signed)
Medication sent to pharmacy  

## 2015-11-02 LAB — HEPATITIS C ANTIBODY: HCV Ab: NEGATIVE

## 2015-11-07 NOTE — Assessment & Plan Note (Signed)
stable overall by history and exam, recent data reviewed with pt, and pt to continue medical treatment as before,  to f/u any worsening symptoms or concerns Lab Results  Component Value Date   LDLCALC 174* 06/25/2014   For lower chol diet, has been statin intolerant

## 2015-11-07 NOTE — Assessment & Plan Note (Signed)
stable overall by history and exam, recent data reviewed with pt, and pt to continue medical treatment as before,  to f/u any worsening symptoms or concerns  

## 2015-11-07 NOTE — Assessment & Plan Note (Signed)
stable overall by history and exam, recent data reviewed with pt, and pt to continue medical treatment as before,  to f/u any worsening symptoms or concerns SpO2 Readings from Last 3 Encounters:  11/01/15 95%  06/16/15 97%  05/03/15 97%

## 2015-11-07 NOTE — Assessment & Plan Note (Signed)
stable overall by history and exam, recent data reviewed with pt, and pt to continue medical treatment as before,  to f/u any worsening symptoms or concerns Lab Results  Component Value Date   TSH 0.27* 11/01/2015

## 2015-11-07 NOTE — Assessment & Plan Note (Signed)
Mild to mod, etiology unclear, for cxr, and labs as ordered,  to f/u any worsening symptoms or concerns

## 2015-11-07 NOTE — Assessment & Plan Note (Signed)
Also for psa as he is due, asympt

## 2015-11-07 NOTE — Assessment & Plan Note (Signed)
Atpical, ecg reviewed, for stress test,  to f/u any worsening symptoms or concerns  Note:  Total time for pt hx, exam, review of record with pt in the room, determination of diagnoses and plan for further eval and tx is > 40 min, with over 50% spent in coordination and counseling of patient

## 2015-11-08 ENCOUNTER — Telehealth (HOSPITAL_COMMUNITY): Payer: Self-pay | Admitting: *Deleted

## 2015-11-08 NOTE — Telephone Encounter (Signed)
Left message on voicemail in reference to upcoming appointment scheduled for 11/09/15. Phone number given for a call back so details instructions can be given. Zamara Cozad, Ranae Palms

## 2015-11-09 ENCOUNTER — Ambulatory Visit (HOSPITAL_COMMUNITY): Payer: Medicare Other | Attending: Cardiovascular Disease

## 2015-11-09 ENCOUNTER — Encounter: Payer: Self-pay | Admitting: Internal Medicine

## 2015-11-09 DIAGNOSIS — R079 Chest pain, unspecified: Secondary | ICD-10-CM | POA: Insufficient documentation

## 2015-11-09 DIAGNOSIS — R0609 Other forms of dyspnea: Secondary | ICD-10-CM | POA: Insufficient documentation

## 2015-11-09 DIAGNOSIS — I779 Disorder of arteries and arterioles, unspecified: Secondary | ICD-10-CM | POA: Diagnosis not present

## 2015-11-09 DIAGNOSIS — R9439 Abnormal result of other cardiovascular function study: Secondary | ICD-10-CM | POA: Insufficient documentation

## 2015-11-09 LAB — MYOCARDIAL PERFUSION IMAGING
LV dias vol: 134 mL (ref 62–150)
LV sys vol: 67 mL
Peak HR: 73 {beats}/min
RATE: 0.3
Rest HR: 45 {beats}/min
SDS: 2
SRS: 6
SSS: 8
TID: 1.11

## 2015-11-09 MED ORDER — REGADENOSON 0.4 MG/5ML IV SOLN
0.4000 mg | Freq: Once | INTRAVENOUS | Status: AC
  Administered 2015-11-09: 0.4 mg via INTRAVENOUS

## 2015-11-09 MED ORDER — TECHNETIUM TC 99M TETROFOSMIN IV KIT
32.4000 | PACK | Freq: Once | INTRAVENOUS | Status: AC | PRN
  Administered 2015-11-09: 32 via INTRAVENOUS
  Filled 2015-11-09: qty 32

## 2015-11-09 MED ORDER — TECHNETIUM TC 99M TETROFOSMIN IV KIT
10.2000 | PACK | Freq: Once | INTRAVENOUS | Status: AC | PRN
  Administered 2015-11-09: 10 via INTRAVENOUS
  Filled 2015-11-09: qty 10

## 2016-05-08 ENCOUNTER — Encounter: Payer: Self-pay | Admitting: Internal Medicine

## 2016-05-08 ENCOUNTER — Other Ambulatory Visit (INDEPENDENT_AMBULATORY_CARE_PROVIDER_SITE_OTHER): Payer: Medicare Other

## 2016-05-08 ENCOUNTER — Ambulatory Visit (INDEPENDENT_AMBULATORY_CARE_PROVIDER_SITE_OTHER): Payer: Medicare Other | Admitting: Internal Medicine

## 2016-05-08 VITALS — BP 136/80 | HR 66 | Temp 97.9°F | Resp 20 | Wt 171.5 lb

## 2016-05-08 DIAGNOSIS — E039 Hypothyroidism, unspecified: Secondary | ICD-10-CM

## 2016-05-08 DIAGNOSIS — R6884 Jaw pain: Secondary | ICD-10-CM | POA: Insufficient documentation

## 2016-05-08 DIAGNOSIS — E785 Hyperlipidemia, unspecified: Secondary | ICD-10-CM | POA: Diagnosis not present

## 2016-05-08 DIAGNOSIS — Z23 Encounter for immunization: Secondary | ICD-10-CM | POA: Diagnosis not present

## 2016-05-08 DIAGNOSIS — F458 Other somatoform disorders: Secondary | ICD-10-CM | POA: Diagnosis not present

## 2016-05-08 DIAGNOSIS — R030 Elevated blood-pressure reading, without diagnosis of hypertension: Secondary | ICD-10-CM | POA: Diagnosis not present

## 2016-05-08 DIAGNOSIS — R350 Frequency of micturition: Secondary | ICD-10-CM | POA: Diagnosis not present

## 2016-05-08 LAB — URINALYSIS, ROUTINE W REFLEX MICROSCOPIC
Bilirubin Urine: NEGATIVE
Hgb urine dipstick: NEGATIVE
Ketones, ur: NEGATIVE
Leukocytes, UA: NEGATIVE
Nitrite: NEGATIVE
RBC / HPF: NONE SEEN (ref 0–?)
Specific Gravity, Urine: 1.01 (ref 1.000–1.030)
Total Protein, Urine: NEGATIVE
Urine Glucose: NEGATIVE
Urobilinogen, UA: 0.2 (ref 0.0–1.0)
WBC, UA: NONE SEEN (ref 0–?)
pH: 6 (ref 5.0–8.0)

## 2016-05-08 LAB — TSH: TSH: 0.34 u[IU]/mL — ABNORMAL LOW (ref 0.35–4.50)

## 2016-05-08 LAB — T4, FREE: Free T4: 1.38 ng/dL (ref 0.60–1.60)

## 2016-05-08 MED ORDER — CYCLOBENZAPRINE HCL 5 MG PO TABS: 5.0000 mg | ORAL_TABLET | Freq: Every day | ORAL | 5 refills | Status: DC

## 2016-05-08 MED ORDER — EZETIMIBE 10 MG PO TABS: 10.0000 mg | ORAL_TABLET | Freq: Every day | ORAL | 3 refills | Status: DC

## 2016-05-08 NOTE — Progress Notes (Signed)
Pre visit review using our clinic review tool, if applicable. No additional management support is needed unless otherwise documented below in the visit note. 
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
## 2016-05-08 NOTE — Patient Instructions (Addendum)
You had the flu shot today  Please take all new medication as prescribed - the muscle relaxer at night, as well as the new zetia 10 mg per day for cholesterol  Please continue all other medications as before, and refills have been done if requested.  Please have the pharmacy call with any other refills you may need.  Please continue your efforts at being more active, low cholesterol diet, and weight control.  Please keep your appointments with your specialists as you may have planned  Please go to the LAB in the Basement (turn left off the elevator) for the tests to be done today  You will be contacted by phone if any changes need to be made immediately.  Otherwise, you will receive a letter about your results with an explanation, but please check with MyChart first.  Please remember to sign up for MyChart if you have not done so, as this will be important to you in the future with finding out test results, communicating by private email, and scheduling acute appointments online when needed.0  If you have Medicare related insurance (such as traditoinal Medicare, Blue [employer] or [employer], or similar), Please make an appointment at the Scheduling desk with Maudie Mercury, the [employer], for your Wellness Visit in this office, which is a benefit with your insurance.  Please return in 6 months, or sooner if needed

## 2016-05-14 NOTE — Assessment & Plan Note (Signed)
Also for f/u UA, o/w asympt,  to f/u any worsening symptoms or concerns

## 2016-05-14 NOTE — Assessment & Plan Note (Signed)
stable overall by history and exam, recent data reviewed with pt, and pt to continue medical treatment as before,  to f/u any worsening symptoms or concerns BP Readings from Last 3 Encounters:  05/08/16 136/80  11/01/15 140/84  06/16/15 108/72

## 2016-05-14 NOTE — Assessment & Plan Note (Signed)
Suspect related to teeth grinding at night, for qhs muscle relaxer, f/u dental as planned

## 2016-05-14 NOTE — Assessment & Plan Note (Signed)
stable overall by history and exam, recent data reviewed with pt, and pt to continue medical treatment as before with lower chol diet, also to add zetia 10 qd due to statin intolerance,  to f/u any worsening symptoms or concerns Lab Results  Component Value Date   LDLCALC 174 (H) 06/25/2014

## 2016-05-14 NOTE — Assessment & Plan Note (Signed)
For contd mouth guard

## 2016-05-14 NOTE — Assessment & Plan Note (Signed)
stable overall by history and exam, recent data reviewed with pt, and pt to continue medical treatment as before,  to f/u any worsening symptoms or concerns Lab Results  Component Value Date   TSH 0.34 (L) 05/08/2016

## 2016-11-02 ENCOUNTER — Encounter: Payer: Self-pay | Admitting: Gastroenterology

## 2016-11-06 ENCOUNTER — Ambulatory Visit (INDEPENDENT_AMBULATORY_CARE_PROVIDER_SITE_OTHER): Payer: Medicare Other | Admitting: Internal Medicine

## 2016-11-06 ENCOUNTER — Other Ambulatory Visit (INDEPENDENT_AMBULATORY_CARE_PROVIDER_SITE_OTHER): Payer: Medicare Other

## 2016-11-06 ENCOUNTER — Encounter: Payer: Self-pay | Admitting: Internal Medicine

## 2016-11-06 VITALS — BP 132/84 | HR 50 | Ht 68.0 in | Wt 171.0 lb

## 2016-11-06 DIAGNOSIS — E785 Hyperlipidemia, unspecified: Secondary | ICD-10-CM

## 2016-11-06 DIAGNOSIS — G894 Chronic pain syndrome: Secondary | ICD-10-CM

## 2016-11-06 DIAGNOSIS — N320 Bladder-neck obstruction: Secondary | ICD-10-CM | POA: Diagnosis not present

## 2016-11-06 DIAGNOSIS — J439 Emphysema, unspecified: Secondary | ICD-10-CM

## 2016-11-06 DIAGNOSIS — Z23 Encounter for immunization: Secondary | ICD-10-CM | POA: Diagnosis not present

## 2016-11-06 DIAGNOSIS — E039 Hypothyroidism, unspecified: Secondary | ICD-10-CM

## 2016-11-06 DIAGNOSIS — Z114 Encounter for screening for human immunodeficiency virus [HIV]: Secondary | ICD-10-CM | POA: Diagnosis not present

## 2016-11-06 DIAGNOSIS — G4700 Insomnia, unspecified: Secondary | ICD-10-CM

## 2016-11-06 LAB — CBC WITH DIFFERENTIAL/PLATELET
Basophils Absolute: 0 10*3/uL (ref 0.0–0.1)
Basophils Relative: 0.6 % (ref 0.0–3.0)
Eosinophils Absolute: 0.1 10*3/uL (ref 0.0–0.7)
Eosinophils Relative: 1.6 % (ref 0.0–5.0)
HCT: 46.5 % (ref 39.0–52.0)
Hemoglobin: 15.7 g/dL (ref 13.0–17.0)
Lymphocytes Relative: 32.7 % (ref 12.0–46.0)
Lymphs Abs: 2.1 10*3/uL (ref 0.7–4.0)
MCHC: 33.8 g/dL (ref 30.0–36.0)
MCV: 90.7 fl (ref 78.0–100.0)
Monocytes Absolute: 0.7 10*3/uL (ref 0.1–1.0)
Monocytes Relative: 10.9 % (ref 3.0–12.0)
Neutro Abs: 3.6 10*3/uL (ref 1.4–7.7)
Neutrophils Relative %: 54.2 % (ref 43.0–77.0)
Platelets: 260 10*3/uL (ref 150.0–400.0)
RBC: 5.13 Mil/uL (ref 4.22–5.81)
RDW: 13 % (ref 11.5–15.5)
WBC: 6.6 10*3/uL (ref 4.0–10.5)

## 2016-11-06 LAB — HEPATIC FUNCTION PANEL
ALT: 13 U/L (ref 0–53)
AST: 15 U/L (ref 0–37)
Albumin: 4.5 g/dL (ref 3.5–5.2)
Alkaline Phosphatase: 72 U/L (ref 39–117)
Bilirubin, Direct: 0.1 mg/dL (ref 0.0–0.3)
Total Bilirubin: 0.4 mg/dL (ref 0.2–1.2)
Total Protein: 7.4 g/dL (ref 6.0–8.3)

## 2016-11-06 LAB — BASIC METABOLIC PANEL
BUN: 9 mg/dL (ref 6–23)
CO2: 26 mEq/L (ref 19–32)
Calcium: 9.9 mg/dL (ref 8.4–10.5)
Chloride: 105 mEq/L (ref 96–112)
Creatinine, Ser: 0.76 mg/dL (ref 0.40–1.50)
GFR: 109.37 mL/min (ref >60.00)
Glucose, Bld: 100 mg/dL — ABNORMAL HIGH (ref 70–99)
Potassium: 4.8 mEq/L (ref 3.5–5.1)
Sodium: 139 mEq/L (ref 135–145)

## 2016-11-06 LAB — URINALYSIS, ROUTINE W REFLEX MICROSCOPIC
Bilirubin Urine: NEGATIVE
Hgb urine dipstick: NEGATIVE
Ketones, ur: NEGATIVE
Leukocytes, UA: NEGATIVE
Nitrite: NEGATIVE
Specific Gravity, Urine: 1.01 (ref 1.000–1.030)
Total Protein, Urine: NEGATIVE
Urine Glucose: NEGATIVE
Urobilinogen, UA: 0.2 (ref 0.0–1.0)
WBC, UA: NONE SEEN — AB (ref 0–?)
pH: 6 (ref 5.0–8.0)

## 2016-11-06 LAB — LIPID PANEL
Cholesterol: 192 mg/dL (ref 0–200)
HDL: 42.3 mg/dL (ref >39.00)
LDL Cholesterol: 113 mg/dL — ABNORMAL HIGH (ref 0–99)
NonHDL: 149.57
Total CHOL/HDL Ratio: 5
Triglycerides: 181 mg/dL — ABNORMAL HIGH (ref 0.0–149.0)
VLDL: 36.2 mg/dL (ref 0.0–40.0)

## 2016-11-06 LAB — TSH: TSH: 0.37 u[IU]/mL (ref 0.35–4.50)

## 2016-11-06 LAB — T4, FREE: Free T4: 1.37 ng/dL (ref 0.60–1.60)

## 2016-11-06 LAB — PSA: PSA: 1.08 ng/mL (ref 0.10–4.00)

## 2016-11-06 MED ORDER — ALBUTEROL SULFATE HFA 108 (90 BASE) MCG/ACT IN AERS: 2.0000 | INHALATION_SPRAY | Freq: Four times a day (QID) | RESPIRATORY_TRACT | 3 refills | Status: DC | PRN

## 2016-11-06 MED ORDER — TIOTROPIUM BROMIDE MONOHYDRATE 18 MCG IN CAPS: 18.0000 ug | ORAL_CAPSULE | Freq: Every day | RESPIRATORY_TRACT | 3 refills | Status: DC

## 2016-11-06 MED ORDER — EZETIMIBE 10 MG PO TABS: 10.0000 mg | ORAL_TABLET | Freq: Every day | ORAL | 3 refills | Status: DC

## 2016-11-06 MED ORDER — LEVOTHYROXINE SODIUM 175 MCG PO TABS: 175.0000 ug | ORAL_TABLET | Freq: Every day | ORAL | 3 refills | Status: DC

## 2016-11-06 MED ORDER — OXYCODONE-ACETAMINOPHEN 5-325 MG PO TABS: 1.0000 | ORAL_TABLET | ORAL | 0 refills | Status: DC | PRN

## 2016-11-06 MED ORDER — TRAZODONE HCL 50 MG PO TABS: 25.0000 mg | ORAL_TABLET | Freq: Every evening | ORAL | 1 refills | Status: DC | PRN

## 2016-11-06 NOTE — Assessment & Plan Note (Signed)
stable overall by history and exam, recent data reviewed with pt, and pt to continue medical treatment as before,  to f/u any worsening symptoms or concerns Lab Results  Component Value Date   TSH 0.34 (L) 05/08/2016   For f/u lab

## 2016-11-06 NOTE — Patient Instructions (Addendum)
You had the new Prevnar pneumonia shot  Please take all new medication as prescribed - the trazadone for sleep  Please continue all other medications as before, and refills have been done if requested - the pain medication  Please have the pharmacy call with any other refills you may need.  Please continue your efforts at being more active, low cholesterol diet, and weight control.  You are otherwise up to date with prevention measures today.  Please keep your appointments with your specialists as you may have planned  Please go to the LAB in the Basement (turn left off the elevator) for the tests to be done today  You will be contacted by phone if any changes need to be made immediately.  Otherwise, you will receive a letter about your results with an explanation, but please check with MyChart first.  Please remember to sign up for MyChart if you have not done so, as this will be important to you in the future with finding out test results, communicating by private email, and scheduling acute appointments online when needed.  If you have Medicare related insurance (such as traditional Medicare, [employer] or [employer], or similar), Please make an appointment at the [facility] with Sharee Pimple, the [employer], for your Wellness Visit in this office, which is a benefit with your insurance.  Please remember to sign up for MyChart if you have not done so, as this will be important to you in the future with finding out test results, communicating by private email, and scheduling acute appointments online when needed.  Please return in 1 year for your yearly visit, or sooner if needed, with Lab testing done 3-5 days before

## 2016-11-06 NOTE — Assessment & Plan Note (Signed)
Ok for refil oxycodone prn,  controlled substance DB reviewed, for ortho f/u as planned
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
## 2016-11-06 NOTE — Assessment & Plan Note (Signed)
stable overall by history and exam, recent data reviewed with pt, and pt to continue medical treatment as before,  to f/u any worsening symptoms or concerns Lab Results  Component Value Date   LDLCALC 174 (H) 06/25/2014   To cont zetia, for f/U lab today

## 2016-11-06 NOTE — Assessment & Plan Note (Signed)
Asympt, also for psa as he is due 

## 2016-11-06 NOTE — Assessment & Plan Note (Signed)
stable overall by history and exam, recent data reviewed with pt, and pt to continue medical treatment as before,  to f/u any worsening symptoms or concerns  

## 2016-11-07 ENCOUNTER — Encounter: Payer: Self-pay | Admitting: Internal Medicine

## 2016-11-07 LAB — HIV ANTIBODY (ROUTINE TESTING W REFLEX): HIV 1&2 Ab, 4th Generation: NONREACTIVE

## 2016-11-20 ENCOUNTER — Encounter: Payer: Self-pay | Admitting: Gastroenterology

## 2017-01-18 ENCOUNTER — Ambulatory Visit (AMBULATORY_SURGERY_CENTER): Payer: Self-pay | Admitting: *Deleted

## 2017-01-18 VITALS — Ht 68.0 in | Wt 173.8 lb

## 2017-01-18 DIAGNOSIS — Z86010 Personal history of colonic polyps: Secondary | ICD-10-CM

## 2017-01-18 MED ORDER — NA SULFATE-K SULFATE-MG SULF 17.5-3.13-1.6 GM/177ML PO SOLN: 1.0000 [IU] | Freq: Once | ORAL | 0 refills | Status: AC

## 2017-01-18 NOTE — Progress Notes (Signed)
No egg or soy allergy known to patient  No issues with past sedation with any surgeries  or procedures, no intubation problems Pt. States that he  Coded one time during colonoscopy procedure. Last one he did fine. No diet pills per patient No home 02 use per patient  No blood thinners per patient  Pt denies issues with constipation pt. Takes stool softner daily  No A fib or A flutter  EMMI video sent to pt's e mail  Pt. declines

## 2017-02-06 ENCOUNTER — Ambulatory Visit (AMBULATORY_SURGERY_CENTER): Payer: Medicare Other | Admitting: Gastroenterology

## 2017-02-06 ENCOUNTER — Encounter: Payer: Self-pay | Admitting: Gastroenterology

## 2017-02-06 VITALS — BP 116/69 | HR 44 | Temp 96.6°F | Resp 12 | Ht 68.0 in | Wt 173.0 lb

## 2017-02-06 DIAGNOSIS — K5730 Diverticulosis of large intestine without perforation or abscess without bleeding: Secondary | ICD-10-CM

## 2017-02-06 DIAGNOSIS — Z86010 Personal history of colonic polyps: Secondary | ICD-10-CM | POA: Diagnosis not present

## 2017-02-06 DIAGNOSIS — D124 Benign neoplasm of descending colon: Secondary | ICD-10-CM

## 2017-02-06 DIAGNOSIS — J449 Chronic obstructive pulmonary disease, unspecified: Secondary | ICD-10-CM | POA: Diagnosis not present

## 2017-02-06 MED ORDER — SODIUM CHLORIDE 0.9 % IV SOLN: 500.0000 mL | INTRAVENOUS | Status: DC

## 2017-02-06 NOTE — Progress Notes (Signed)
Called to room to assist during endoscopic procedure.  Patient ID and intended procedure confirmed with present staff. Received instructions for my participation in the procedure from the performing physician.  

## 2017-02-06 NOTE — Op Note (Signed)
[facility] Patient Name: Guy Morris Procedure Date: 02/06/2017 10:38 AM MRN: [id number] Endoscopist: Milus Banister , MD Age: 67 Referring MD:  Date of Birth: 28-Nov-1950 Gender: Male Account #: [id number] Procedure:                Colonoscopy Indications:              High risk colon cancer surveillance: Personal                            history of colonic polyps: mutiple adenomatous                            polyps 2009, 2010 (including one TA                           with small focus of HGD in 2009). 12/2013                            colonoscopy 3 subCM adenomas Medicines:                Monitored Anesthesia Care Procedure:                Pre-Anesthesia Assessment:                           - Prior to the procedure, a History and Physical                            was performed, and patient medications and                            allergies were reviewed. The patient's tolerance of                            previous anesthesia was also reviewed. The risks                            and benefits of the procedure and the sedation                            options and risks were discussed with the patient.                            All questions were answered, and informed consent                            was obtained. Prior Anticoagulants: The patient has                            taken no previous anticoagulant or antiplatelet                            agents. ASA Grade Assessment: II - A patient with  mild systemic disease. After reviewing the risks                            and benefits, the patient was deemed in                            satisfactory condition to undergo the procedure.                           After obtaining informed consent, the colonoscope                            was passed under direct vision. Throughout the                            procedure, the patient's blood pressure, pulse, and               oxygen saturations were monitored continuously. The                            Colonoscope was introduced through the anus and                            advanced to the the cecum, identified by                            appendiceal orifice and ileocecal valve. The                            colonoscopy was performed without difficulty. The                            patient tolerated the procedure well. The quality                            of the bowel preparation was adequate. The                            ileocecal valve, appendiceal orifice, and rectum                            were photographed. Scope In: 10:41:11 AM Scope Out: 10:55:23 AM Scope Withdrawal Time: 0 hours 10 minutes 11 seconds  Total Procedure Duration: 0 hours 14 minutes 12 seconds  Findings:                 Multiple small and large-mouthed diverticula were                            found in the left colon.                           A 2 mm polyp was found in the descending colon. The  polyp was sessile. The polyp was removed with a                            cold snare. Resection and retrieval were complete.                           The exam was otherwise without abnormality on                            direct and retroflexion views. Complications:            No immediate complications. Estimated blood loss:                            None. Estimated Blood Loss:     Estimated blood loss: none. Impression:               - Diverticulosis in the left colon.                           - One 2 mm polyp in the descending colon, removed                            with a cold snare. Resected and retrieved.                           - The examination was otherwise normal on direct                            and retroflexion views. Recommendation:           - Patient has a contact number available for                            emergencies. The signs and symptoms of potential                             delayed complications were discussed with the                            patient. Return to normal activities tomorrow.                            Written discharge instructions were provided to the                            patient.                           - Resume previous diet.                           - Continue present medications.                           You will receive a letter within 2-3 weeks with the  pathology results and my final recommendations.                           If the polyp(s) is proven to be 'pre-cancerous' on                            pathology, you will need repeat colonoscopy in 5                            years. Milus Banister, MD 02/06/2017 10:58:20 AM This report has been signed electronically.

## 2017-02-06 NOTE — Patient Instructions (Signed)
Discharge instructions given. Handouts on polyps and diverticulosis. Resume previous medications. YOU HAD AN ENDOSCOPIC PROCEDURE TODAY AT THE [facility]:   Refer to the procedure report that was given to you for any specific questions about what was found during the examination.  If the procedure report does not answer your questions, please call your gastroenterologist to clarify.  If you requested that your care partner not be given the details of your procedure findings, then the procedure report has been included in a sealed envelope for you to review at your convenience later.  YOU SHOULD EXPECT: Some feelings of bloating in the abdomen. Passage of more gas than usual.  Walking can help get rid of the air that was put into your GI tract during the procedure and reduce the bloating. If you had a lower endoscopy (such as a colonoscopy or flexible sigmoidoscopy) you may notice spotting of blood in your stool or on the toilet paper. If you underwent a bowel prep for your procedure, you may not have a normal bowel movement for a few days.  Please Note:  You might notice some irritation and congestion in your nose or some drainage.  This is from the oxygen used during your procedure.  There is no need for concern and it should clear up in a day or so.  SYMPTOMS TO REPORT IMMEDIATELY:   Following lower endoscopy (colonoscopy or flexible sigmoidoscopy):  Excessive amounts of blood in the stool  Significant tenderness or worsening of abdominal pains  Swelling of the abdomen that is new, acute  Fever of 100F or higher   For urgent or emergent issues, a gastroenterologist can be reached at any hour by calling [phone].   DIET:  We do recommend a small meal at first, but then you may proceed to your regular diet.  Drink plenty of fluids but you should avoid alcoholic beverages for 24 hours.  ACTIVITY:  You should plan to take it easy for the rest of today and you should NOT  DRIVE or use heavy machinery until tomorrow (because of the sedation medicines used during the test).    FOLLOW UP: Our staff will call the number listed on your records the next business day following your procedure to check on you and address any questions or concerns that you may have regarding the information given to you following your procedure. If we do not reach you, we will leave a message.  However, if you are feeling well and you are not experiencing any problems, there is no need to return our call.  We will assume that you have returned to your regular daily activities without incident.  If any biopsies were taken you will be contacted by phone or by letter within the next 1-3 weeks.  Please call us at [phone] if you have not heard about the biopsies in 3 weeks.    SIGNATURES/CONFIDENTIALITY: You and/or your care partner have signed paperwork which will be entered into your electronic medical record.  These signatures attest to the fact that that the information above on your After Visit Summary has been reviewed and is understood.  Full responsibility of the confidentiality of this discharge information lies with you and/or your care-partner. 

## 2017-02-06 NOTE — Progress Notes (Signed)
Pt's states no medical or surgical changes since previsit or office visit. 

## 2017-02-06 NOTE — Progress Notes (Signed)
Report to PACU, RN, vss, BBS= Clear.  

## 2017-02-07 ENCOUNTER — Telehealth: Payer: Self-pay | Admitting: *Deleted

## 2017-02-07 NOTE — Telephone Encounter (Signed)
  Follow up Call-  Call back number 02/06/2017  Post procedure Call Back phone  # [phone]  Permission to leave phone message Yes  Some recent data might be hidden    [facility]

## 2017-02-07 NOTE — Telephone Encounter (Signed)
  Follow up Call-  Call back number 02/06/2017  Post procedure Call Back phone  # [phone]  Permission to leave phone message Yes  Some recent data might be hidden    [facility]

## 2017-02-17 ENCOUNTER — Encounter: Payer: Self-pay | Admitting: Gastroenterology

## 2017-05-13 DIAGNOSIS — Z23 Encounter for immunization: Secondary | ICD-10-CM | POA: Diagnosis not present

## 2017-06-20 ENCOUNTER — Ambulatory Visit: Payer: Self-pay

## 2017-06-20 NOTE — Telephone Encounter (Signed)
FYI

## 2017-06-20 NOTE — Telephone Encounter (Signed)
Wife speaking for pt who is in the room.Pt c/o chest and jaw pain a week and a half. Pt states it feels like "an elephant has been sitting on his chest." Pain radiates to the jaw. Pt c/o SOB at rest and with walking. Pt stated pain is constant. Advised to go to [facility] or the nearest hospital, NPO and chew an aspirin. Routing high priority to PCP Reason for Disposition . Pain also present in shoulder(s) or arm(s) or jaw  (Exception: pain is clearly made worse by movement)  Answer Assessment - Initial Assessment Questions 1. RESPIRATORY STATUS: "Describe your breathing?" (e.g., wheezing, shortness of breath, unable to speak, severe coughing)      SOB at rest and with exertion 2. ONSET: "When did this breathing problem begin?"      1 week ago 3. PATTERN "Does the difficult breathing come and go, or has it been constant since it started?"      constant 4. SEVERITY: "How bad is your breathing?" (e.g., mild, moderate, severe)    - MILD: No SOB at rest, mild SOB with walking, speaks normally in sentences, can lay down, no retractions, pulse < 100.    - MODERATE: SOB at rest, SOB with minimal exertion and prefers to sit, cannot lie down flat, speaks in phrases, mild retractions, audible wheezing, pulse 100-120.    - SEVERE: Very SOB at rest, speaks in single words, struggling to breathe, sitting hunched forward, retractions, pulse > 120      SOB at rest and with exertion- moderate 5. RECURRENT SYMPTOM: "Have you had difficulty breathing before?" If so, ask: "When was the last time?" and "What happened that time?"      Didn't ask 6. CARDIAC HISTORY: "Do you have any history of heart disease?" (e.g., heart attack, angina, bypass surgery, angioplasty)      Pt states that he had a previous referral to cardiologist and he never went 7. LUNG HISTORY: "Do you have any history of lung disease?"  (e.g., pulmonary embolus, asthma, emphysema)     Never asked 8. CAUSE: "What do you think is causing the  breathing problem?"      Never asked 9. OTHER SYMPTOMS: "Do you have any other symptoms? (e.g., dizziness, runny nose, cough, chest pain, fever)     Pt states that it feels like an elephant is sitting on his chest. Pt states he has jaw pain as well 10. PREGNANCY: "Is there any chance you are pregnant?" "When was your last menstrual period?"       n/a 11. TRAVEL: "Have you traveled out of the country in the last month?" (e.g., travel history, exposures)       Did not ask  Answer Assessment - Initial Assessment Questions 1. LOCATION: "Where does it hurt?"       chest 2. RADIATION: "Does the pain go anywhere else?" (e.g., into neck, jaw, arms, back)     To the jaw 3. ONSET: "When did the chest pain begin?" (Minutes, hours or days)       Week and a half 4. PATTERN "Does the pain come and go, or has it been constant since it started?"  "Does it get worse with exertion?"      constant 5. DURATION: "How long does it last" (e.g., seconds, minutes, hours)     constant 6. SEVERITY: "How bad is the pain?"  (e.g., Scale 1-10; mild, moderate, or severe)    - MILD (1-3): doesn't interfere with normal activities     -  MODERATE (4-7): interferes with normal activities or awakens from sleep    - SEVERE (8-10): excruciating pain, unable to do any normal activities        Did not ask 7. CARDIAC RISK FACTORS: "Do you have any history of heart problems or risk factors for heart disease?" (e.g., prior heart attack, angina; high blood pressure, diabetes, being overweight, high cholesterol, smoking, or strong family history of heart disease)     Was supposed to go to referral to cardiologist office after a knee surgery and never went to consult 8. PULMONARY RISK FACTORS: "Do you have any history of lung disease?"  (e.g., blood clots in lung, asthma, emphysema, birth control pills)     Did not ask 9. CAUSE: "What do you think is causing the chest pain?"     Did not ask 10. OTHER SYMPTOMS: "Do you have any  other symptoms?" (e.g., dizziness, nausea, vomiting, sweating, fever, difficulty breathing, cough)       Difficulty breathing 11. PREGNANCY: "Is there any chance you are pregnant?" "When was your last menstrual period?"       n/a  Protocols used: CHEST PAIN-A-AH, BREATHING DIFFICULTY-A-AH

## 2017-11-05 ENCOUNTER — Encounter: Payer: Self-pay | Admitting: Internal Medicine

## 2017-11-05 ENCOUNTER — Ambulatory Visit (INDEPENDENT_AMBULATORY_CARE_PROVIDER_SITE_OTHER): Payer: Medicare Other | Admitting: Internal Medicine

## 2017-11-05 ENCOUNTER — Other Ambulatory Visit (INDEPENDENT_AMBULATORY_CARE_PROVIDER_SITE_OTHER): Payer: Medicare Other

## 2017-11-05 VITALS — BP 118/76 | HR 49 | Temp 98.0°F | Ht 68.0 in | Wt 174.0 lb

## 2017-11-05 DIAGNOSIS — J439 Emphysema, unspecified: Secondary | ICD-10-CM | POA: Diagnosis not present

## 2017-11-05 DIAGNOSIS — R739 Hyperglycemia, unspecified: Secondary | ICD-10-CM | POA: Diagnosis not present

## 2017-11-05 DIAGNOSIS — N320 Bladder-neck obstruction: Secondary | ICD-10-CM

## 2017-11-05 DIAGNOSIS — Z23 Encounter for immunization: Secondary | ICD-10-CM

## 2017-11-05 DIAGNOSIS — E785 Hyperlipidemia, unspecified: Secondary | ICD-10-CM

## 2017-11-05 DIAGNOSIS — E039 Hypothyroidism, unspecified: Secondary | ICD-10-CM

## 2017-11-05 DIAGNOSIS — M1990 Unspecified osteoarthritis, unspecified site: Secondary | ICD-10-CM | POA: Diagnosis not present

## 2017-11-05 DIAGNOSIS — R001 Bradycardia, unspecified: Secondary | ICD-10-CM | POA: Diagnosis not present

## 2017-11-05 LAB — CBC WITH DIFFERENTIAL/PLATELET
Basophils Absolute: 0 K/uL (ref 0.0–0.1)
Basophils Relative: 0.6 % (ref 0.0–3.0)
Eosinophils Absolute: 0.1 K/uL (ref 0.0–0.7)
Eosinophils Relative: 1.8 % (ref 0.0–5.0)
HCT: 46.7 % (ref 39.0–52.0)
Hemoglobin: 15.8 g/dL (ref 13.0–17.0)
Lymphocytes Relative: 33.2 % (ref 12.0–46.0)
Lymphs Abs: 1.8 K/uL (ref 0.7–4.0)
MCHC: 33.8 g/dL (ref 30.0–36.0)
MCV: 91.6 fl (ref 78.0–100.0)
Monocytes Absolute: 0.6 K/uL (ref 0.1–1.0)
Monocytes Relative: 11.5 % (ref 3.0–12.0)
Neutro Abs: 2.9 K/uL (ref 1.4–7.7)
Neutrophils Relative %: 52.9 % (ref 43.0–77.0)
Platelets: 235 K/uL (ref 150.0–400.0)
RBC: 5.1 Mil/uL (ref 4.22–5.81)
RDW: 13.1 % (ref 11.5–15.5)
WBC: 5.5 K/uL (ref 4.0–10.5)

## 2017-11-05 LAB — BASIC METABOLIC PANEL WITH GFR
BUN: 12 mg/dL (ref 6–23)
CO2: 26 meq/L (ref 19–32)
Calcium: 9.6 mg/dL (ref 8.4–10.5)
Chloride: 104 meq/L (ref 96–112)
Creatinine, Ser: 0.66 mg/dL (ref 0.40–1.50)
GFR: 128.31 mL/min (ref >60.00)
Glucose, Bld: 98 mg/dL (ref 70–99)
Potassium: 4.3 meq/L (ref 3.5–5.1)
Sodium: 139 meq/L (ref 135–145)

## 2017-11-05 LAB — URINALYSIS, ROUTINE W REFLEX MICROSCOPIC
Bilirubin Urine: NEGATIVE
Hgb urine dipstick: NEGATIVE
Ketones, ur: NEGATIVE
Leukocytes, UA: NEGATIVE
Nitrite: NEGATIVE
RBC / HPF: NONE SEEN (ref 0–?)
Specific Gravity, Urine: 1.01 (ref 1.000–1.030)
Total Protein, Urine: NEGATIVE
Urine Glucose: NEGATIVE
Urobilinogen, UA: 0.2 (ref 0.0–1.0)
pH: 6 (ref 5.0–8.0)

## 2017-11-05 LAB — HEMOGLOBIN A1C: Hgb A1c MFr Bld: 6.1 % (ref 4.6–6.5)

## 2017-11-05 LAB — HEPATIC FUNCTION PANEL
ALT: 12 U/L (ref 0–53)
AST: 14 U/L (ref 0–37)
Albumin: 4.5 g/dL (ref 3.5–5.2)
Alkaline Phosphatase: 82 U/L (ref 39–117)
Bilirubin, Direct: 0.1 mg/dL (ref 0.0–0.3)
Total Bilirubin: 0.5 mg/dL (ref 0.2–1.2)
Total Protein: 7.2 g/dL (ref 6.0–8.3)

## 2017-11-05 LAB — LIPID PANEL
Cholesterol: 219 mg/dL — ABNORMAL HIGH (ref 0–200)
HDL: 45.3 mg/dL (ref >39.00)
LDL Cholesterol: 136 mg/dL — ABNORMAL HIGH (ref 0–99)
NonHDL: 174.01
Total CHOL/HDL Ratio: 5
Triglycerides: 191 mg/dL — ABNORMAL HIGH (ref 0.0–149.0)
VLDL: 38.2 mg/dL (ref 0.0–40.0)

## 2017-11-05 LAB — TSH: TSH: 0.07 u[IU]/mL — ABNORMAL LOW (ref 0.35–4.50)

## 2017-11-05 LAB — PSA: PSA: 1.14 ng/mL (ref 0.10–4.00)
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
## 2017-11-05 NOTE — Patient Instructions (Addendum)
You had the Pneumovax pneumonia shot today  You will be contacted regarding the referral for: Echocardiogram, and Cardiology  Your EKG was ok but showed the slow heart rate  Please continue all other medications as before, and refills have been done if requested.  Please have the pharmacy call with any other refills you may need.  Please continue your efforts at being more active, low cholesterol diet, and weight control.  You are otherwise up to date with prevention measures today.  Please keep your appointments with your specialists as you may have planned  Please go to the LAB in the Basement (turn left off the elevator) for the tests to be done today  You will be contacted by phone if any changes need to be made immediately.  Otherwise, you will receive a letter about your results with an explanation, but please check with MyChart first.  Please remember to sign up for MyChart if you have not done so, as this will be important to you in the future with finding out test results, communicating by private email, and scheduling acute appointments online when needed.  Please return in 6 months, or sooner if needed

## 2017-11-06 ENCOUNTER — Ambulatory Visit: Payer: Medicare Other | Admitting: Internal Medicine

## 2017-11-10 ENCOUNTER — Encounter: Payer: Self-pay | Admitting: Internal Medicine

## 2017-11-10 NOTE — Assessment & Plan Note (Signed)
Pt to f/u ortho as planned

## 2017-11-10 NOTE — Assessment & Plan Note (Signed)
stable overall by history and exam, recent data reviewed with pt, and pt to continue medical treatment as before,  to f/u any worsening symptoms or concerns, for f/u lab per pt reqeust 

## 2017-11-10 NOTE — Assessment & Plan Note (Signed)
Also for psa as he is due, asympt

## 2017-11-10 NOTE — Assessment & Plan Note (Signed)
stable overall by history and exam, recent data reviewed with pt, and pt to continue medical treatment as before,  to f/u any worsening symptoms or concerns, for f/u lab 

## 2017-11-10 NOTE — Assessment & Plan Note (Signed)
stable overall by history and exam, and pt to continue medical treatment as before,  to f/u any worsening symptoms or concerns 

## 2017-11-10 NOTE — Assessment & Plan Note (Signed)
Lab Results  Component Value Date   HGBA1C 6.1 11/05/2017  stable overall by history and exam, recent data reviewed with pt, and pt to continue medical treatment as before,  to f/u any worsening symptoms or concerns

## 2017-11-10 NOTE — Assessment & Plan Note (Addendum)
New onset, possible intermittently symptomatic, ecg reviewed with pt, for echo and cardiology asap  Note:  Total time for pt hx, exam, review of record with pt in the room, determination of diagnoses and plan for further eval and tx is > 40 min, with over 50% spent in coordination and counseling of patient including the differential dx, tx, further evaluation and other management of bradycardia, right knee djd, hyperglycemia, HLD, COPD

## 2017-11-15 ENCOUNTER — Ambulatory Visit (HOSPITAL_COMMUNITY): Payer: Medicare Other | Attending: Internal Medicine

## 2017-11-15 ENCOUNTER — Encounter: Payer: Self-pay | Admitting: Internal Medicine

## 2017-11-15 ENCOUNTER — Other Ambulatory Visit: Payer: Self-pay

## 2017-11-15 DIAGNOSIS — I340 Nonrheumatic mitral (valve) insufficiency: Secondary | ICD-10-CM | POA: Insufficient documentation

## 2017-11-15 DIAGNOSIS — R001 Bradycardia, unspecified: Secondary | ICD-10-CM | POA: Diagnosis not present

## 2017-11-15 DIAGNOSIS — E7800 Pure hypercholesterolemia, unspecified: Secondary | ICD-10-CM | POA: Diagnosis not present

## 2017-11-17 ENCOUNTER — Other Ambulatory Visit: Payer: Self-pay | Admitting: Internal Medicine
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
## 2017-11-25 ENCOUNTER — Ambulatory Visit (INDEPENDENT_AMBULATORY_CARE_PROVIDER_SITE_OTHER): Payer: Medicare Other | Admitting: Cardiology

## 2017-11-25 ENCOUNTER — Encounter: Payer: Self-pay | Admitting: Cardiology

## 2017-11-25 VITALS — BP 124/80 | HR 62 | Ht 68.0 in | Wt 174.0 lb

## 2017-11-25 DIAGNOSIS — I208 Other forms of angina pectoris: Secondary | ICD-10-CM | POA: Diagnosis not present

## 2017-11-25 DIAGNOSIS — Z136 Encounter for screening for cardiovascular disorders: Secondary | ICD-10-CM

## 2017-11-25 DIAGNOSIS — Z87891 Personal history of nicotine dependence: Secondary | ICD-10-CM

## 2017-11-25 DIAGNOSIS — E785 Hyperlipidemia, unspecified: Secondary | ICD-10-CM | POA: Diagnosis not present

## 2017-11-25 DIAGNOSIS — I6522 Occlusion and stenosis of left carotid artery: Secondary | ICD-10-CM | POA: Diagnosis not present

## 2017-11-25 MED ORDER — METOPROLOL TARTRATE 25 MG PO TABS: 25.0000 mg | ORAL_TABLET | Freq: Once | ORAL | 0 refills | Status: DC

## 2017-11-25 NOTE — Patient Instructions (Addendum)
Medication Instructions:  TAKE- Metoprolol 25 mg 1 hour before procedure  If you need a refill on your cardiac medications before your next appointment, please call your pharmacy.  Labwork: BMP HERE IN OUR OFFICE AT [facility]  Take the provided lab slips with you to the lab for your blood draw.    You will NOT need to fast   Testing/Procedures: Non-Cardiac CT Angiography (CTA), is a special type of CT scan that uses a computer to produce multi-dimensional views of major blood vessels throughout the body. In CT angiography, a contrast material is injected through an IV to help visualize the blood vessels  Your physician has requested that you have a carotid duplex. This test is an ultrasound of the carotid arteries in your neck. It looks at blood flow through these arteries that supply the brain with blood. Allow one hour for this exam. There are no restrictions or special instructions.  Your physician has requested that you have an abdominal aorta duplex. During this test, an ultrasound is used to evaluate the aorta. Allow 30 minutes for this exam. Do not eat after midnight the day before and avoid carbonated beverages   Special Instructions:   Please arrive at the [place] main entrance of [facility] at              AM (30-45 minutes prior to test start time)  [facility], [place] [address] [phone]  Proceed to the [facility] Radiology Department (First Floor).  Please follow these instructions carefully (unless otherwise directed):  Hold all erectile dysfunction medications at least 48 hours prior to test.  On the Night Before the Test: . Drink plenty of water. . Do not consume any caffeinated/decaffeinated beverages or chocolate 12 hours prior to your test. . Do not take any antihistamines 12 hours prior to your test. . If you take Metformin do not take 24 hours prior to test. . If the patient has contrast  allergy: ? Patient will need a prescription for Prednisone and very clear instructions (as follows): 1. Prednisone 50 mg - take 13 hours prior to test 2. Take another Prednisone 50 mg 7 hours prior to test 3. Take another Prednisone 50 mg 1 hour prior to test 4. Take Benadryl 50 mg 1 hour prior to test . Patient must complete all four doses of above prophylactic medications. . Patient will need a ride after test due to Benadryl.  On the Day of the Test: . Drink plenty of water. Do not drink any water within one hour of the test. . Do not eat any food 4 hours prior to the test. . You may take your regular medications prior to the test. . IF NOT ON A BETA BLOCKER - Take 50 mg of lopressor (metoprolol) one hour before the test. . HOLD Furosemide morning of the test.  After the Test: . Drink plenty of water. . After receiving IV contrast, you may experience a mild flushed feeling. This is normal. . On occasion, you may experience a mild rash up to 24 hours after the test. This is not dangerous. If this occurs, you can take Benadryl 25 mg and increase your fluid intake. . If you experience trouble breathing, this can be serious. If it is severe call 911 IMMEDIATELY. If it is mild, please call our office. . If you take any of these medications: Glipizide/Metformin, Avandament, Glucavance, please do not take 48 hours after completing test.  Follow-Up: Your physician wants you to follow-up in: As Needed.     Thank you for choosing [facility] at [facility]!!

## 2017-12-05 ENCOUNTER — Ambulatory Visit (HOSPITAL_COMMUNITY)
Admission: RE | Admit: 2017-12-05 | Discharge: 2017-12-05 | Disposition: A | Discharge status: Home / Self Care | Payer: Medicare Other | Source: Ambulatory Visit | Attending: Cardiovascular Disease | Admitting: Cardiovascular Disease

## 2017-12-05 ENCOUNTER — Other Ambulatory Visit: Payer: Self-pay | Admitting: Cardiology

## 2017-12-05 ENCOUNTER — Telehealth: Payer: Self-pay | Admitting: Internal Medicine

## 2017-12-05 DIAGNOSIS — R0989 Other specified symptoms and signs involving the circulatory and respiratory systems: Secondary | ICD-10-CM | POA: Diagnosis not present

## 2017-12-05 DIAGNOSIS — I6523 Occlusion and stenosis of bilateral carotid arteries: Secondary | ICD-10-CM | POA: Insufficient documentation

## 2017-12-05 DIAGNOSIS — Z87891 Personal history of nicotine dependence: Secondary | ICD-10-CM

## 2017-12-05 DIAGNOSIS — E785 Hyperlipidemia, unspecified: Secondary | ICD-10-CM | POA: Insufficient documentation

## 2017-12-05 DIAGNOSIS — J449 Chronic obstructive pulmonary disease, unspecified: Secondary | ICD-10-CM | POA: Insufficient documentation

## 2017-12-05 DIAGNOSIS — I6522 Occlusion and stenosis of left carotid artery: Secondary | ICD-10-CM | POA: Diagnosis not present

## 2017-12-05 DIAGNOSIS — Z136 Encounter for screening for cardiovascular disorders: Secondary | ICD-10-CM

## 2017-12-05 NOTE — Telephone Encounter (Signed)
Patient has dropped off a renewal parking placard, he is requesting two to be completed. The renewal, plus a new one stating patient needs a permament placard.   From has been completed and placed in providers box to sign.

## 2017-12-05 NOTE — Telephone Encounter (Signed)
Form has been signed, copy sent to scan. ° °Original mailed to patient as requested. °

## 2017-12-09 ENCOUNTER — Ambulatory Visit: Payer: Medicare Other | Admitting: Internal Medicine

## 2017-12-10 ENCOUNTER — Encounter: Payer: Self-pay | Admitting: Internal Medicine

## 2017-12-10 ENCOUNTER — Ambulatory Visit (INDEPENDENT_AMBULATORY_CARE_PROVIDER_SITE_OTHER): Payer: Medicare Other | Admitting: Internal Medicine

## 2017-12-10 VITALS — BP 120/84 | HR 68 | Temp 97.8°F | Ht 68.0 in | Wt 173.0 lb

## 2017-12-10 DIAGNOSIS — R030 Elevated blood-pressure reading, without diagnosis of hypertension: Secondary | ICD-10-CM | POA: Diagnosis not present

## 2017-12-10 DIAGNOSIS — R001 Bradycardia, unspecified: Secondary | ICD-10-CM | POA: Diagnosis not present

## 2017-12-10 DIAGNOSIS — I208 Other forms of angina pectoris: Secondary | ICD-10-CM | POA: Diagnosis not present

## 2017-12-10 DIAGNOSIS — E039 Hypothyroidism, unspecified: Secondary | ICD-10-CM

## 2017-12-10 DIAGNOSIS — I5189 Other ill-defined heart diseases: Secondary | ICD-10-CM | POA: Insufficient documentation

## 2017-12-10 MED ORDER — LEVOTHYROXINE SODIUM 150 MCG PO TABS: 150.0000 ug | ORAL_TABLET | Freq: Every day | ORAL | 3 refills | Status: DC
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
## 2017-12-10 NOTE — Assessment & Plan Note (Signed)
Mild overcontrolled, for decreased levothyroxine to 150 qd

## 2017-12-10 NOTE — Assessment & Plan Note (Signed)
Improved, asympt,  to f/u any worsening symptoms or concerns

## 2017-12-10 NOTE — Assessment & Plan Note (Signed)
stable overall by history and exam, recent data reviewed with pt, and pt to continue medical treatment as before,  to f/u any worsening symptoms or concerns BP Readings from Last 3 Encounters:  12/10/17 120/84  11/25/17 124/80  11/05/17 118/76

## 2017-12-10 NOTE — Assessment & Plan Note (Signed)
Asympt, cont to follow

## 2017-12-10 NOTE — Patient Instructions (Addendum)
Ok to decrease the thyroid medication to the 150 mcg per day  Please go to the LAB in the Basement (turn left off the elevator) for the tests to be donein 4 weeks (just go to lab)  Please continue all other medications as before, and refills have been done if requested.  Please have the pharmacy call with any other refills you may need.  Please continue your efforts at being more active, low cholesterol diet, and weight control.  Please keep your appointments with your specialists as you may have planned

## 2017-12-16 ENCOUNTER — Telehealth: Payer: Self-pay | Admitting: *Deleted

## 2017-12-16 DIAGNOSIS — I6523 Occlusion and stenosis of bilateral carotid arteries: Secondary | ICD-10-CM

## 2017-12-16 NOTE — Telephone Encounter (Signed)
Order for carotid placed for 1 Year

## 2017-12-16 NOTE — Telephone Encounter (Signed)
-----   Message from Minus Breeding, MD sent at 12/05/2017 10:07 PM EDT ----- Right Carotid:  1-39%        Enlarged cervical lymph node,        superior neck, measuring 1.3 x .47 x 1.0 cm.  Left Carotid:  40-59% stenosis,        Please tell him that the carotid stenosis is stable.   There is a large lymph node and I will ask Dr. Jenny Reichmann to follow this.

## 2018-01-28 ENCOUNTER — Encounter: Payer: Self-pay | Admitting: Cardiology

## 2018-03-26 DIAGNOSIS — Z23 Encounter for immunization: Secondary | ICD-10-CM | POA: Diagnosis not present

## 2018-05-13 ENCOUNTER — Other Ambulatory Visit (INDEPENDENT_AMBULATORY_CARE_PROVIDER_SITE_OTHER): Payer: Medicare Other

## 2018-05-13 ENCOUNTER — Encounter: Payer: Self-pay | Admitting: Internal Medicine

## 2018-05-13 ENCOUNTER — Ambulatory Visit (INDEPENDENT_AMBULATORY_CARE_PROVIDER_SITE_OTHER): Payer: Medicare Other | Admitting: Internal Medicine

## 2018-05-13 VITALS — BP 116/68 | HR 49 | Temp 97.7°F | Ht 68.0 in | Wt 179.0 lb

## 2018-05-13 DIAGNOSIS — I208 Other forms of angina pectoris: Secondary | ICD-10-CM | POA: Diagnosis not present

## 2018-05-13 DIAGNOSIS — E559 Vitamin D deficiency, unspecified: Secondary | ICD-10-CM

## 2018-05-13 DIAGNOSIS — R531 Weakness: Secondary | ICD-10-CM | POA: Diagnosis not present

## 2018-05-13 DIAGNOSIS — E785 Hyperlipidemia, unspecified: Secondary | ICD-10-CM

## 2018-05-13 DIAGNOSIS — R739 Hyperglycemia, unspecified: Secondary | ICD-10-CM | POA: Diagnosis not present

## 2018-05-13 DIAGNOSIS — E039 Hypothyroidism, unspecified: Secondary | ICD-10-CM | POA: Diagnosis not present

## 2018-05-13 DIAGNOSIS — R221 Localized swelling, mass and lump, neck: Secondary | ICD-10-CM

## 2018-05-13 DIAGNOSIS — R2689 Other abnormalities of gait and mobility: Secondary | ICD-10-CM | POA: Insufficient documentation

## 2018-05-13 LAB — LIPID PANEL
Cholesterol: 226 mg/dL — ABNORMAL HIGH (ref 0–200)
HDL: 47.3 mg/dL (ref >39.00)
LDL Cholesterol: 141 mg/dL — ABNORMAL HIGH (ref 0–99)
NonHDL: 178.43
Total CHOL/HDL Ratio: 5
Triglycerides: 187 mg/dL — ABNORMAL HIGH (ref 0.0–149.0)
VLDL: 37.4 mg/dL (ref 0.0–40.0)

## 2018-05-13 LAB — BASIC METABOLIC PANEL
BUN: 11 mg/dL (ref 6–23)
CO2: 26 mEq/L (ref 19–32)
Calcium: 9.8 mg/dL (ref 8.4–10.5)
Chloride: 103 mEq/L (ref 96–112)
Creatinine, Ser: 0.79 mg/dL (ref 0.40–1.50)
GFR: 104.1 mL/min (ref >60.00)
Glucose, Bld: 100 mg/dL — ABNORMAL HIGH (ref 70–99)
Potassium: 4.2 mEq/L (ref 3.5–5.1)
Sodium: 138 mEq/L (ref 135–145)

## 2018-05-13 LAB — VITAMIN B12: Vitamin B-12: 255 pg/mL (ref 211–911)

## 2018-05-13 LAB — HEPATIC FUNCTION PANEL
ALT: 15 U/L (ref 0–53)
AST: 15 U/L (ref 0–37)
Albumin: 4.6 g/dL (ref 3.5–5.2)
Alkaline Phosphatase: 86 U/L (ref 39–117)
Bilirubin, Direct: 0.1 mg/dL (ref 0.0–0.3)
Total Bilirubin: 0.5 mg/dL (ref 0.2–1.2)
Total Protein: 7.1 g/dL (ref 6.0–8.3)

## 2018-05-13 LAB — CBC WITH DIFFERENTIAL/PLATELET
Basophils Absolute: 0 10*3/uL (ref 0.0–0.1)
Basophils Relative: 0.8 % (ref 0.0–3.0)
Eosinophils Absolute: 0.1 10*3/uL (ref 0.0–0.7)
Eosinophils Relative: 1.6 % (ref 0.0–5.0)
HCT: 47.3 % (ref 39.0–52.0)
Hemoglobin: 16.2 g/dL (ref 13.0–17.0)
Lymphocytes Relative: 35.1 % (ref 12.0–46.0)
Lymphs Abs: 2.1 10*3/uL (ref 0.7–4.0)
MCHC: 34.2 g/dL (ref 30.0–36.0)
MCV: 91.3 fl (ref 78.0–100.0)
Monocytes Absolute: 0.6 10*3/uL (ref 0.1–1.0)
Monocytes Relative: 9.2 % (ref 3.0–12.0)
Neutro Abs: 3.2 10*3/uL (ref 1.4–7.7)
Neutrophils Relative %: 53.3 % (ref 43.0–77.0)
Platelets: 243 10*3/uL (ref 150.0–400.0)
RBC: 5.18 Mil/uL (ref 4.22–5.81)
RDW: 13 % (ref 11.5–15.5)
WBC: 6.1 10*3/uL (ref 4.0–10.5)

## 2018-05-13 LAB — CORTISOL: Cortisol, Plasma: 12.1 ug/dL

## 2018-05-13 LAB — TESTOSTERONE: Testosterone: 430.29 ng/dL (ref 300.00–890.00)

## 2018-05-13 LAB — T4, FREE: Free T4: 1.06 ng/dL (ref 0.60–1.60)

## 2018-05-13 LAB — HEMOGLOBIN A1C: Hgb A1c MFr Bld: 6 % (ref 4.6–6.5)

## 2018-05-13 LAB — VITAMIN D 25 HYDROXY (VIT D DEFICIENCY, FRACTURES): VITD: 32.21 ng/mL (ref 30.00–100.00)

## 2018-05-13 LAB — TSH: TSH: 0.84 u[IU]/mL (ref 0.35–4.50)

## 2018-05-13 LAB — CK: Total CK: 74 U/L (ref 7–232)

## 2018-05-13 LAB — SEDIMENTATION RATE: Sed Rate: 5 mm/hr (ref 0–20)

## 2018-05-13 NOTE — Assessment & Plan Note (Signed)
Statin intolerant, has had improved left carotid stenosis recently per pt with diet

## 2018-05-13 NOTE — Assessment & Plan Note (Signed)
stable overall by history and exam, recent data reviewed with pt, and pt to continue medical treatment as before,  to f/u any worsening symptoms or concerns  

## 2018-05-13 NOTE — Assessment & Plan Note (Addendum)
Worsening issue not explained by cardiopulm dz or MSK - so I ? Loss of postural reflexes, such as with PD - for neurology referral  Note:  Total time for pt hx, exam, review of record with pt in the room, determination of diagnoses and plan for further eval and tx is > 40 min, with over 50% spent in coordination and counseling of patient including the differential dx, tx, further evaluation and other management of balance d/o, hypothyroid, general weakness, neck mass, HLD, hyperglycemia

## 2018-05-13 NOTE — Assessment & Plan Note (Signed)
?   Significance, but in light of general worsening weakness and stamina for unclear reasons, will need to r/o malignancy, will start with CT neck w/ CM, and consider ENT referral

## 2018-05-13 NOTE — Patient Instructions (Signed)
Please continue all other medications as before, and refills have been done if requested.  Please have the pharmacy call with any other refills you may need.  Please continue your efforts at being more active, low cholesterol diet, and weight control.  You are otherwise up to date with prevention measures today.  Please keep your appointments with your specialists as you may have planned  You will be contacted regarding the referral for: CT scan for neck  Please go to the LAB in the Basement (turn left off the elevator) for the tests to be done today  You will be contacted by phone if any changes need to be made immediately.  Otherwise, you will receive a letter about your results with an explanation, but please check with MyChart first.  Please remember to sign up for MyChart if you have not done so, as this will be important to you in the future with finding out test results, communicating by private email, and scheduling acute appointments online when needed.  Please return in 3 months, or sooner if needed

## 2018-05-13 NOTE — Assessment & Plan Note (Signed)
With decreased levothyroxine at last visit - for f/u lab today, asympt
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
## 2018-05-13 NOTE — Assessment & Plan Note (Addendum)
Etiology unclear, for labs as ordered, does not appear to need PT at this time

## 2018-05-16 ENCOUNTER — Encounter: Payer: Self-pay | Admitting: Neurology

## 2018-05-29 ENCOUNTER — Ambulatory Visit (INDEPENDENT_AMBULATORY_CARE_PROVIDER_SITE_OTHER)
Admission: RE | Admit: 2018-05-29 | Discharge: 2018-05-29 | Disposition: A | Discharge status: Home / Self Care | Payer: Medicare Other | Source: Ambulatory Visit | Attending: Internal Medicine | Admitting: Internal Medicine

## 2018-05-29 DIAGNOSIS — R221 Localized swelling, mass and lump, neck: Secondary | ICD-10-CM | POA: Diagnosis not present

## 2018-05-29 DIAGNOSIS — M47812 Spondylosis without myelopathy or radiculopathy, cervical region: Secondary | ICD-10-CM | POA: Diagnosis not present

## 2018-05-29 MED ORDER — IOPAMIDOL (ISOVUE-300) INJECTION 61%
75.0000 mL | Freq: Once | INTRAVENOUS | Status: AC | PRN
  Administered 2018-05-29: 75 mL via INTRAVENOUS

## 2018-05-30 ENCOUNTER — Encounter: Payer: Self-pay | Admitting: Internal Medicine
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
## 2018-07-21 ENCOUNTER — Ambulatory Visit (INDEPENDENT_AMBULATORY_CARE_PROVIDER_SITE_OTHER): Payer: Medicare Other | Admitting: Neurology

## 2018-07-21 ENCOUNTER — Encounter: Payer: Self-pay | Admitting: Neurology

## 2018-07-21 VITALS — BP 120/72 | HR 57 | Ht 67.5 in | Wt 175.0 lb

## 2018-07-21 DIAGNOSIS — R296 Repeated falls: Secondary | ICD-10-CM | POA: Diagnosis not present

## 2018-07-21 DIAGNOSIS — R202 Paresthesia of skin: Secondary | ICD-10-CM

## 2018-07-21 DIAGNOSIS — R292 Abnormal reflex: Secondary | ICD-10-CM

## 2018-07-21 DIAGNOSIS — R2689 Other abnormalities of gait and mobility: Secondary | ICD-10-CM

## 2018-07-21 DIAGNOSIS — R200 Anesthesia of skin: Secondary | ICD-10-CM

## 2018-07-21 NOTE — Patient Instructions (Addendum)
You don't have parkinson's disease.  I want to check MRI of cervical spine to evaluate for any arthritis that may be pinching the spinal cord.  Further recommendations pending results.  We have sent a referral to [facility] for your MRI and they will call you directly to schedule your appt. They are located at [place]. If you need to contact them directly please call [phone].

## 2018-07-29 ENCOUNTER — Ambulatory Visit
Admission: RE | Admit: 2018-07-29 | Discharge: 2018-07-29 | Disposition: A | Discharge status: Home / Self Care | Payer: Medicare Other | Source: Ambulatory Visit | Attending: Neurology | Admitting: Neurology

## 2018-07-29 DIAGNOSIS — R202 Paresthesia of skin: Secondary | ICD-10-CM

## 2018-07-29 DIAGNOSIS — R2689 Other abnormalities of gait and mobility: Secondary | ICD-10-CM

## 2018-07-29 DIAGNOSIS — R296 Repeated falls: Secondary | ICD-10-CM

## 2018-07-29 DIAGNOSIS — M47812 Spondylosis without myelopathy or radiculopathy, cervical region: Secondary | ICD-10-CM | POA: Diagnosis not present

## 2018-07-29 DIAGNOSIS — M4802 Spinal stenosis, cervical region: Secondary | ICD-10-CM | POA: Diagnosis not present

## 2018-07-29 DIAGNOSIS — M5022 Other cervical disc displacement, mid-cervical region, unspecified level: Secondary | ICD-10-CM | POA: Diagnosis not present

## 2018-07-29 DIAGNOSIS — R292 Abnormal reflex: Secondary | ICD-10-CM

## 2018-07-29 DIAGNOSIS — R200 Anesthesia of skin: Secondary | ICD-10-CM

## 2018-08-01 ENCOUNTER — Telehealth: Payer: Self-pay

## 2018-08-01 NOTE — Telephone Encounter (Signed)
Called and LMOVM for Pt to return call 

## 2018-08-01 NOTE — Telephone Encounter (Signed)
-----   Message from Pieter Partridge, DO sent at 07/29/2018  6:37 PM EST ----- The MRI shows a disc bulge pressing on the spinal cord.  This may be contributing to the numbness and tingling of his arms and legs as well as the balance problems.  I would like to refer him to neurosurgery for evaluation.

## 2018-08-04 ENCOUNTER — Telehealth: Payer: Self-pay | Admitting: Neurology

## 2018-08-04 ENCOUNTER — Telehealth: Payer: Self-pay

## 2018-08-04 NOTE — Telephone Encounter (Signed)
Mardene Celeste called and is needing to give a new call back number on patient for MRI Results. Please Call. Thanks

## 2018-08-04 NOTE — Telephone Encounter (Signed)
-----   Message from Pieter Partridge, DO sent at 07/29/2018  6:37 PM EST ----- The MRI shows a disc bulge pressing on the spinal cord.  This may be contributing to the numbness and tingling of his arms and legs as well as the balance problems.  I would like to refer him to neurosurgery for evaluation.

## 2018-08-04 NOTE — Telephone Encounter (Signed)
Called [phone]  , spoke with Pt. I advised him of results and recommendations. I am faxing referral to  [facility]

## 2018-08-14 ENCOUNTER — Encounter: Payer: Self-pay | Admitting: Internal Medicine

## 2018-08-14 ENCOUNTER — Other Ambulatory Visit (INDEPENDENT_AMBULATORY_CARE_PROVIDER_SITE_OTHER): Payer: Medicare Other

## 2018-08-14 ENCOUNTER — Other Ambulatory Visit: Payer: Medicare Other

## 2018-08-14 ENCOUNTER — Ambulatory Visit (INDEPENDENT_AMBULATORY_CARE_PROVIDER_SITE_OTHER): Payer: Medicare Other | Admitting: Internal Medicine

## 2018-08-14 VITALS — BP 116/72 | HR 52 | Temp 98.0°F | Ht 67.5 in | Wt 175.0 lb

## 2018-08-14 DIAGNOSIS — R739 Hyperglycemia, unspecified: Secondary | ICD-10-CM

## 2018-08-14 DIAGNOSIS — E785 Hyperlipidemia, unspecified: Secondary | ICD-10-CM | POA: Diagnosis not present

## 2018-08-14 DIAGNOSIS — E039 Hypothyroidism, unspecified: Secondary | ICD-10-CM | POA: Diagnosis not present

## 2018-08-14 DIAGNOSIS — Z23 Encounter for immunization: Secondary | ICD-10-CM | POA: Diagnosis not present

## 2018-08-14 DIAGNOSIS — N320 Bladder-neck obstruction: Secondary | ICD-10-CM | POA: Diagnosis not present

## 2018-08-14 DIAGNOSIS — J439 Emphysema, unspecified: Secondary | ICD-10-CM | POA: Diagnosis not present

## 2018-08-14 LAB — BASIC METABOLIC PANEL
BUN: 11 mg/dL (ref 6–23)
CO2: 28 mEq/L (ref 19–32)
Calcium: 9.5 mg/dL (ref 8.4–10.5)
Chloride: 104 mEq/L (ref 96–112)
Creatinine, Ser: 0.83 mg/dL (ref 0.40–1.50)
GFR: 92.45 mL/min (ref >60.00)
Glucose, Bld: 95 mg/dL (ref 70–99)
Potassium: 4.3 mEq/L (ref 3.5–5.1)
Sodium: 138 mEq/L (ref 135–145)

## 2018-08-14 LAB — URINALYSIS, ROUTINE W REFLEX MICROSCOPIC
Bilirubin Urine: NEGATIVE
Hgb urine dipstick: NEGATIVE
Ketones, ur: NEGATIVE
Leukocytes,Ua: NEGATIVE
Nitrite: NEGATIVE
RBC / HPF: NONE SEEN (ref 0–?)
Specific Gravity, Urine: 1.01 (ref 1.000–1.030)
Total Protein, Urine: NEGATIVE
Urine Glucose: NEGATIVE
Urobilinogen, UA: 0.2 (ref 0.0–1.0)
WBC, UA: NONE SEEN (ref 0–?)
pH: 6 (ref 5.0–8.0)

## 2018-08-14 LAB — CBC WITH DIFFERENTIAL/PLATELET
Basophils Absolute: 0 10*3/uL (ref 0.0–0.1)
Basophils Relative: 0.7 % (ref 0.0–3.0)
Eosinophils Absolute: 0.1 10*3/uL (ref 0.0–0.7)
Eosinophils Relative: 1.5 % (ref 0.0–5.0)
HCT: 47 % (ref 39.0–52.0)
Hemoglobin: 16.1 g/dL (ref 13.0–17.0)
Lymphocytes Relative: 32.4 % (ref 12.0–46.0)
Lymphs Abs: 2.1 10*3/uL (ref 0.7–4.0)
MCHC: 34.2 g/dL (ref 30.0–36.0)
MCV: 91.5 fl (ref 78.0–100.0)
Monocytes Absolute: 0.6 10*3/uL (ref 0.1–1.0)
Monocytes Relative: 10 % (ref 3.0–12.0)
Neutro Abs: 3.6 10*3/uL (ref 1.4–7.7)
Neutrophils Relative %: 55.4 % (ref 43.0–77.0)
Platelets: 227 10*3/uL (ref 150.0–400.0)
RBC: 5.13 Mil/uL (ref 4.22–5.81)
RDW: 13 % (ref 11.5–15.5)
WBC: 6.5 10*3/uL (ref 4.0–10.5)

## 2018-08-14 LAB — TSH: TSH: 0.77 u[IU]/mL (ref 0.35–4.50)

## 2018-08-14 LAB — LIPID PANEL
Cholesterol: 227 mg/dL — ABNORMAL HIGH (ref 0–200)
HDL: 47.5 mg/dL (ref >39.00)
LDL Cholesterol: 147 mg/dL — ABNORMAL HIGH (ref 0–99)
NonHDL: 179.69
Total CHOL/HDL Ratio: 5
Triglycerides: 162 mg/dL — ABNORMAL HIGH (ref 0.0–149.0)
VLDL: 32.4 mg/dL (ref 0.0–40.0)

## 2018-08-14 LAB — HEPATIC FUNCTION PANEL
ALT: 13 U/L (ref 0–53)
AST: 13 U/L (ref 0–37)
Albumin: 4.5 g/dL (ref 3.5–5.2)
Alkaline Phosphatase: 77 U/L (ref 39–117)
Bilirubin, Direct: 0.1 mg/dL (ref 0.0–0.3)
Total Bilirubin: 0.6 mg/dL (ref 0.2–1.2)
Total Protein: 6.8 g/dL (ref 6.0–8.3)

## 2018-08-14 LAB — PSA: PSA: 1.19 ng/mL (ref 0.10–4.00)

## 2018-08-14 LAB — HEMOGLOBIN A1C: Hgb A1c MFr Bld: 6.1 % (ref 4.6–6.5)

## 2018-08-14 NOTE — Assessment & Plan Note (Signed)
stable overall by history and exam, recent data reviewed with pt, and pt to continue medical treatment as before,  to f/u any worsening symptoms or concerns  

## 2018-08-14 NOTE — Assessment & Plan Note (Signed)
Mild, for a1c with labs

## 2018-08-14 NOTE — Assessment & Plan Note (Signed)
Mod elevated, was much improved with a diet episode a few yrs ago with wt loss, now worsening again, has been statin intolerant, for lower chol diet
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
## 2018-08-14 NOTE — Patient Instructions (Addendum)
You had the Tdap tetanus shot today  Please continue all other medications as before, and refills have been done if requested.  Please have the pharmacy call with any other refills you may need.  Please continue your efforts at being more active, low cholesterol diet, and weight control.  You are otherwise up to date with prevention measures today.  Please keep your appointments with your specialists as you may have planned  Please go to the LAB in the Basement (turn left off the elevator) for the tests to be done today  You will be contacted by phone if any changes need to be made immediately.  Otherwise, you will receive a letter about your results with an explanation, but please check with MyChart first.  Please remember to sign up for MyChart if you have not done so, as this will be important to you in the future with finding out test results, communicating by private email, and scheduling acute appointments online when needed.  Please return in 6 months, or sooner if needed 

## 2018-08-14 NOTE — Assessment & Plan Note (Signed)
stable overall by history and exam, recent data reviewed with pt, and pt to continue medical treatment as before,  to f/u any worsening symptoms or concerns, for f/u labs 

## 2018-08-14 NOTE — Addendum Note (Signed)
Addended by: Juliet Rude on: 08/14/2018 09:25 AM   Modules accepted: Orders

## 2018-08-15 ENCOUNTER — Encounter: Payer: Self-pay | Admitting: Internal Medicine

## 2018-09-25 NOTE — Telephone Encounter (Signed)
Received fax from [facility] stating:  Finally spoke with patient today and he said that he does NOT want an appt with spine specialists. He states that he is NOT going to have surgery, he states he told you all that and does not know why y'all sent the referral. Referral is now inactive. Date and time of transmission 09/17/2018 4:13:24 and received.

## 2018-12-07 ENCOUNTER — Other Ambulatory Visit: Payer: Self-pay | Admitting: Internal Medicine

## 2018-12-16 ENCOUNTER — Encounter (HOSPITAL_COMMUNITY): Payer: Medicare Other

## 2019-02-12 ENCOUNTER — Encounter: Payer: Self-pay | Admitting: Internal Medicine

## 2019-02-12 ENCOUNTER — Other Ambulatory Visit (INDEPENDENT_AMBULATORY_CARE_PROVIDER_SITE_OTHER): Payer: Medicare Other

## 2019-02-12 ENCOUNTER — Ambulatory Visit (INDEPENDENT_AMBULATORY_CARE_PROVIDER_SITE_OTHER): Payer: Medicare Other | Admitting: Internal Medicine

## 2019-02-12 ENCOUNTER — Other Ambulatory Visit: Payer: Self-pay

## 2019-02-12 VITALS — BP 124/76 | HR 51 | Temp 97.7°F | Ht 67.5 in | Wt 172.0 lb

## 2019-02-12 DIAGNOSIS — E039 Hypothyroidism, unspecified: Secondary | ICD-10-CM

## 2019-02-12 DIAGNOSIS — E559 Vitamin D deficiency, unspecified: Secondary | ICD-10-CM | POA: Diagnosis not present

## 2019-02-12 DIAGNOSIS — J439 Emphysema, unspecified: Secondary | ICD-10-CM | POA: Diagnosis not present

## 2019-02-12 DIAGNOSIS — E538 Deficiency of other specified B group vitamins: Secondary | ICD-10-CM

## 2019-02-12 DIAGNOSIS — R739 Hyperglycemia, unspecified: Secondary | ICD-10-CM

## 2019-02-12 DIAGNOSIS — E785 Hyperlipidemia, unspecified: Secondary | ICD-10-CM | POA: Diagnosis not present

## 2019-02-12 DIAGNOSIS — E611 Iron deficiency: Secondary | ICD-10-CM | POA: Diagnosis not present

## 2019-02-12 LAB — VITAMIN D 25 HYDROXY (VIT D DEFICIENCY, FRACTURES): VITD: 32.05 ng/mL (ref 30.00–100.00)

## 2019-02-12 LAB — HEPATIC FUNCTION PANEL
ALT: 11 U/L (ref 0–53)
AST: 16 U/L (ref 0–37)
Albumin: 4.3 g/dL (ref 3.5–5.2)
Alkaline Phosphatase: 76 U/L (ref 39–117)
Bilirubin, Direct: 0.1 mg/dL (ref 0.0–0.3)
Total Bilirubin: 0.5 mg/dL (ref 0.2–1.2)
Total Protein: 6.7 g/dL (ref 6.0–8.3)

## 2019-02-12 LAB — IBC PANEL
Iron: 137 ug/dL (ref 42–165)
Saturation Ratios: 36 % (ref 20.0–50.0)
Transferrin: 272 mg/dL (ref 212.0–360.0)

## 2019-02-12 LAB — LIPID PANEL
Cholesterol: 221 mg/dL — ABNORMAL HIGH (ref 0–200)
HDL: 45.9 mg/dL (ref >39.00)
NonHDL: 175.06
Total CHOL/HDL Ratio: 5
Triglycerides: 229 mg/dL — ABNORMAL HIGH (ref 0.0–149.0)
VLDL: 45.8 mg/dL — ABNORMAL HIGH (ref 0.0–40.0)

## 2019-02-12 LAB — BASIC METABOLIC PANEL
BUN: 14 mg/dL (ref 6–23)
CO2: 27 mEq/L (ref 19–32)
Calcium: 9.4 mg/dL (ref 8.4–10.5)
Chloride: 104 mEq/L (ref 96–112)
Creatinine, Ser: 0.84 mg/dL (ref 0.40–1.50)
GFR: 91.04 mL/min (ref >60.00)
Glucose, Bld: 92 mg/dL (ref 70–99)
Potassium: 4.5 mEq/L (ref 3.5–5.1)
Sodium: 139 mEq/L (ref 135–145)

## 2019-02-12 LAB — HEMOGLOBIN A1C: Hgb A1c MFr Bld: 6.2 % (ref 4.6–6.5)

## 2019-02-12 LAB — VITAMIN B12: Vitamin B-12: 264 pg/mL (ref 211–911)

## 2019-02-12 LAB — LDL CHOLESTEROL, DIRECT: Direct LDL: 154 mg/dL

## 2019-02-12 MED ORDER — LEVOTHYROXINE SODIUM 150 MCG PO TABS: 150.0000 ug | ORAL_TABLET | Freq: Every day | ORAL | 3 refills | Status: DC
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
## 2019-02-12 NOTE — Assessment & Plan Note (Signed)
stable overall by history and exam, recent data reviewed with pt, and pt to continue medical treatment as before,  to f/u any worsening symptoms or concerns  

## 2019-02-12 NOTE — Patient Instructions (Addendum)
Please continue all other medications as before, and refills have been done if requested.  Please have the pharmacy call with any other refills you may need.  Please continue your efforts at being more active, low cholesterol diet, and weight control.  You are otherwise up to date with prevention measures today.  Please keep your appointments with your specialists as you may have planned  Please return in 6 months, or sooner if needed 

## 2019-04-06 DIAGNOSIS — Z23 Encounter for immunization: Secondary | ICD-10-CM | POA: Diagnosis not present

## 2019-08-13 ENCOUNTER — Ambulatory Visit (INDEPENDENT_AMBULATORY_CARE_PROVIDER_SITE_OTHER): Payer: Medicare HMO | Admitting: Internal Medicine

## 2019-08-13 ENCOUNTER — Encounter: Payer: Self-pay | Admitting: Internal Medicine

## 2019-08-13 ENCOUNTER — Other Ambulatory Visit: Payer: Self-pay

## 2019-08-13 VITALS — BP 122/84 | HR 52 | Temp 97.7°F | Ht 67.5 in | Wt 180.0 lb

## 2019-08-13 DIAGNOSIS — R2689 Other abnormalities of gait and mobility: Secondary | ICD-10-CM

## 2019-08-13 DIAGNOSIS — R739 Hyperglycemia, unspecified: Secondary | ICD-10-CM | POA: Diagnosis not present

## 2019-08-13 DIAGNOSIS — Z0000 Encounter for general adult medical examination without abnormal findings: Secondary | ICD-10-CM

## 2019-08-13 DIAGNOSIS — R519 Headache, unspecified: Secondary | ICD-10-CM | POA: Diagnosis not present

## 2019-08-13 DIAGNOSIS — Z0001 Encounter for general adult medical examination with abnormal findings: Secondary | ICD-10-CM

## 2019-08-13 DIAGNOSIS — M47812 Spondylosis without myelopathy or radiculopathy, cervical region: Secondary | ICD-10-CM | POA: Diagnosis not present

## 2019-08-13 DIAGNOSIS — R030 Elevated blood-pressure reading, without diagnosis of hypertension: Secondary | ICD-10-CM

## 2019-08-13 LAB — LIPID PANEL
Cholesterol: 216 mg/dL — ABNORMAL HIGH (ref 0–200)
HDL: 45.1 mg/dL (ref >39.00)
LDL Cholesterol: 144 mg/dL — ABNORMAL HIGH (ref 0–99)
NonHDL: 171.17
Total CHOL/HDL Ratio: 5
Triglycerides: 134 mg/dL (ref 0.0–149.0)
VLDL: 26.8 mg/dL (ref 0.0–40.0)

## 2019-08-13 LAB — BASIC METABOLIC PANEL
BUN: 12 mg/dL (ref 6–23)
CO2: 27 mEq/L (ref 19–32)
Calcium: 9.5 mg/dL (ref 8.4–10.5)
Chloride: 103 mEq/L (ref 96–112)
Creatinine, Ser: 0.87 mg/dL (ref 0.40–1.50)
GFR: 87.3 mL/min (ref >60.00)
Glucose, Bld: 98 mg/dL (ref 70–99)
Potassium: 4.1 mEq/L (ref 3.5–5.1)
Sodium: 138 mEq/L (ref 135–145)

## 2019-08-13 LAB — PSA: PSA: 0.8 ng/mL (ref 0.10–4.00)

## 2019-08-13 LAB — SEDIMENTATION RATE: Sed Rate: 6 mm/hr (ref 0–20)

## 2019-08-13 LAB — URINALYSIS, ROUTINE W REFLEX MICROSCOPIC
Bilirubin Urine: NEGATIVE
Hgb urine dipstick: NEGATIVE
Ketones, ur: NEGATIVE
Leukocytes,Ua: NEGATIVE
Nitrite: NEGATIVE
RBC / HPF: NONE SEEN (ref 0–?)
Specific Gravity, Urine: 1.02 (ref 1.000–1.030)
Total Protein, Urine: NEGATIVE
Urine Glucose: NEGATIVE
Urobilinogen, UA: 0.2 (ref 0.0–1.0)
WBC, UA: NONE SEEN (ref 0–?)
pH: 5.5 (ref 5.0–8.0)

## 2019-08-13 LAB — HEMOGLOBIN A1C: Hgb A1c MFr Bld: 6 % (ref 4.6–6.5)

## 2019-08-13 LAB — HEPATIC FUNCTION PANEL
ALT: 12 U/L (ref 0–53)
AST: 16 U/L (ref 0–37)
Albumin: 4.2 g/dL (ref 3.5–5.2)
Alkaline Phosphatase: 83 U/L (ref 39–117)
Bilirubin, Direct: 0.1 mg/dL (ref 0.0–0.3)
Total Bilirubin: 0.5 mg/dL (ref 0.2–1.2)
Total Protein: 6.7 g/dL (ref 6.0–8.3)

## 2019-08-13 LAB — CBC WITH DIFFERENTIAL/PLATELET
Basophils Absolute: 0 10*3/uL (ref 0.0–0.1)
Basophils Relative: 0.8 % (ref 0.0–3.0)
Eosinophils Absolute: 0.1 10*3/uL (ref 0.0–0.7)
Eosinophils Relative: 1.8 % (ref 0.0–5.0)
HCT: 46.3 % (ref 39.0–52.0)
Hemoglobin: 15.8 g/dL (ref 13.0–17.0)
Lymphocytes Relative: 32 % (ref 12.0–46.0)
Lymphs Abs: 1.9 10*3/uL (ref 0.7–4.0)
MCHC: 34.1 g/dL (ref 30.0–36.0)
MCV: 92.9 fl (ref 78.0–100.0)
Monocytes Absolute: 0.6 10*3/uL (ref 0.1–1.0)
Monocytes Relative: 10.5 % (ref 3.0–12.0)
Neutro Abs: 3.3 10*3/uL (ref 1.4–7.7)
Neutrophils Relative %: 54.9 % (ref 43.0–77.0)
Platelets: 238 10*3/uL (ref 150.0–400.0)
RBC: 4.99 Mil/uL (ref 4.22–5.81)
RDW: 12.9 % (ref 11.5–15.5)
WBC: 6 10*3/uL (ref 4.0–10.5)

## 2019-08-13 LAB — C-REACTIVE PROTEIN: CRP: <1 mg/dL (ref 0.5–20.0)

## 2019-08-13 LAB — TSH: TSH: 5.19 u[IU]/mL — ABNORMAL HIGH (ref 0.35–4.50)

## 2019-08-13 NOTE — Patient Instructions (Signed)
Please continue all other medications as before, and refills have been done if requested.  Please have the pharmacy call with any other refills you may need.  Please continue your efforts at being more active, low cholesterol diet, and weight control.  You are otherwise up to date with prevention measures today.  Please keep your appointments with your specialists as you may have planned  You will be contacted regarding the referral for: Neurosurgury  Please go to the LAB at the blood drawing area for the tests to be done  You will be contacted by phone if any changes need to be made immediately.  Otherwise, you will receive a letter about your results with an explanation, but please check with MyChart first.  In order to help keep our patients safe and at home we are billing the insurance company for a health consult. You could potentially get a copay to a maximum of $15. Do you agree to this in order to obtain our advice about your concerns?  Please make an Appointment to return in 6 months, or sooner if needed
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
## 2019-08-15 ENCOUNTER — Encounter: Payer: Self-pay | Admitting: Internal Medicine

## 2019-08-15 NOTE — Assessment & Plan Note (Signed)
stable overall by history and exam, recent data reviewed with pt, and pt to continue medical treatment as before,  to f/u any worsening symptoms or concerns  

## 2019-08-15 NOTE — Assessment & Plan Note (Signed)
Also for NS referral as per Neurology

## 2019-08-15 NOTE — Assessment & Plan Note (Addendum)
For cane prn  I spent 24 minutes in addition to time for wellness examination in preparing to see the patient by review of recent labs, imaging and procedures, obtaining and reviewing separately obtained history, communicating with the patient and family or caregiver, ordering medications, tests or procedures, and documenting clinical information in the EHR including the differential Dx, treatment, and any further evaluation and other management of balance d=/o, cervical spondylosis, hyperglycemia, temporal pain, HTN

## 2019-08-15 NOTE — Assessment & Plan Note (Signed)

## 2019-08-15 NOTE — Assessment & Plan Note (Signed)
Cant r/o arteritis - for esr, crp

## 2019-08-26 ENCOUNTER — Telehealth: Payer: Self-pay | Admitting: Internal Medicine

## 2019-08-26 ENCOUNTER — Ambulatory Visit (INDEPENDENT_AMBULATORY_CARE_PROVIDER_SITE_OTHER): Payer: Medicare HMO | Admitting: Internal Medicine

## 2019-08-26 ENCOUNTER — Encounter: Payer: Self-pay | Admitting: Internal Medicine

## 2019-08-26 ENCOUNTER — Other Ambulatory Visit: Payer: Self-pay

## 2019-08-26 VITALS — BP 140/76 | HR 86 | Temp 97.4°F | Ht 67.5 in | Wt 180.0 lb

## 2019-08-26 DIAGNOSIS — J439 Emphysema, unspecified: Secondary | ICD-10-CM | POA: Diagnosis not present

## 2019-08-26 DIAGNOSIS — R739 Hyperglycemia, unspecified: Secondary | ICD-10-CM

## 2019-08-26 DIAGNOSIS — R030 Elevated blood-pressure reading, without diagnosis of hypertension: Secondary | ICD-10-CM

## 2019-08-26 DIAGNOSIS — R1032 Left lower quadrant pain: Secondary | ICD-10-CM | POA: Diagnosis not present
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
## 2019-08-26 NOTE — Assessment & Plan Note (Signed)
stable overall by history and exam, recent data reviewed with pt, and pt to continue medical treatment as before,  to f/u any worsening symptoms or concerns  

## 2019-08-26 NOTE — Assessment & Plan Note (Signed)
Stable

## 2019-08-26 NOTE — Telephone Encounter (Signed)
   Patient states he will wait until April to schedule CT due to insurance changing April 1

## 2019-08-26 NOTE — Assessment & Plan Note (Addendum)
Etiology unclear, cant r/o hernia or mesh displaced, so for CT abd/pelvis now if possible, consider gen surgury   I spent 40 minutes in preparing to see the patient by review of recent labs, imaging and procedures, obtaining and reviewing separately obtained history, communicating with the patient and family or caregiver, ordering medications, tests or procedures, and documenting clinical information in the EHR including the differential Dx, treatment, and any further evaluation and other management of left groin pain, copd, hyperglycemia, HTN

## 2019-08-26 NOTE — Patient Instructions (Signed)
Please continue all other medications as before, and refills have been done if requested.  Please have the pharmacy call with any other refills you may need.  Please continue your efforts at being more active, low cholesterol diet, and weight control.  You are otherwise up to date with prevention measures today.  Please keep your appointments with your specialists as you may have planned  You will be contacted regarding the referral for: CT scan - to see [facility] now

## 2019-08-27 ENCOUNTER — Other Ambulatory Visit: Payer: Self-pay | Admitting: Internal Medicine

## 2019-08-27 ENCOUNTER — Telehealth: Payer: Self-pay | Admitting: Internal Medicine

## 2019-08-27 DIAGNOSIS — R1032 Left lower quadrant pain: Secondary | ICD-10-CM

## 2019-08-27 NOTE — Telephone Encounter (Signed)
Error

## 2019-09-17 ENCOUNTER — Encounter: Payer: Self-pay | Admitting: Internal Medicine

## 2019-09-17 ENCOUNTER — Other Ambulatory Visit: Payer: Self-pay

## 2019-09-17 ENCOUNTER — Ambulatory Visit (INDEPENDENT_AMBULATORY_CARE_PROVIDER_SITE_OTHER)
Admission: RE | Admit: 2019-09-17 | Discharge: 2019-09-17 | Disposition: A | Discharge status: Home / Self Care | Payer: Medicare Other | Source: Ambulatory Visit | Attending: Internal Medicine | Admitting: Internal Medicine

## 2019-09-17 DIAGNOSIS — R1032 Left lower quadrant pain: Secondary | ICD-10-CM

## 2019-09-17 MED ORDER — IOHEXOL 300 MG/ML  SOLN
100.0000 mL | Freq: Once | INTRAMUSCULAR | Status: AC | PRN
  Administered 2019-09-17: 100 mL via INTRAVENOUS

## 2020-02-18 ENCOUNTER — Encounter: Payer: Self-pay | Admitting: Internal Medicine

## 2020-02-18 ENCOUNTER — Other Ambulatory Visit: Payer: Self-pay

## 2020-02-18 ENCOUNTER — Ambulatory Visit (INDEPENDENT_AMBULATORY_CARE_PROVIDER_SITE_OTHER): Payer: Medicare Other | Admitting: Internal Medicine

## 2020-02-18 ENCOUNTER — Ambulatory Visit (INDEPENDENT_AMBULATORY_CARE_PROVIDER_SITE_OTHER): Payer: Medicare Other

## 2020-02-18 VITALS — BP 160/94 | HR 48 | Temp 97.9°F | Ht 67.5 in | Wt 179.0 lb

## 2020-02-18 DIAGNOSIS — J439 Emphysema, unspecified: Secondary | ICD-10-CM

## 2020-02-18 DIAGNOSIS — R42 Dizziness and giddiness: Secondary | ICD-10-CM

## 2020-02-18 DIAGNOSIS — E039 Hypothyroidism, unspecified: Secondary | ICD-10-CM

## 2020-02-18 DIAGNOSIS — R001 Bradycardia, unspecified: Secondary | ICD-10-CM | POA: Diagnosis not present

## 2020-02-18 DIAGNOSIS — R7989 Other specified abnormal findings of blood chemistry: Secondary | ICD-10-CM

## 2020-02-18 DIAGNOSIS — R739 Hyperglycemia, unspecified: Secondary | ICD-10-CM

## 2020-02-18 DIAGNOSIS — R0600 Dyspnea, unspecified: Secondary | ICD-10-CM | POA: Diagnosis not present

## 2020-02-18 DIAGNOSIS — E785 Hyperlipidemia, unspecified: Secondary | ICD-10-CM

## 2020-02-18 LAB — COMPLETE METABOLIC PANEL WITH GFR
AG Ratio: 2 (calc) (ref 1.0–2.5)
ALT: 11 U/L (ref 9–46)
AST: 16 U/L (ref 10–35)
Albumin: 4.4 g/dL (ref 3.6–5.1)
Alkaline phosphatase (APISO): 63 U/L (ref 35–144)
BUN: 10 mg/dL (ref 7–25)
CO2: 23 mmol/L (ref 20–32)
Calcium: 9.2 mg/dL (ref 8.6–10.3)
Chloride: 104 mmol/L (ref 98–110)
Creat: 0.76 mg/dL (ref 0.70–1.25)
GFR, Est African American: 109 mL/min/{1.73_m2} (ref >=60)
GFR, Est Non African American: 94 mL/min/{1.73_m2} (ref >=60)
Globulin: 2.2 g/dL (calc) (ref 1.9–3.7)
Glucose, Bld: 92 mg/dL (ref 65–99)
Potassium: 4.1 mmol/L (ref 3.5–5.3)
Sodium: 138 mmol/L (ref 135–146)
Total Bilirubin: 0.5 mg/dL (ref 0.2–1.2)
Total Protein: 6.6 g/dL (ref 6.1–8.1)

## 2020-02-18 LAB — CBC WITH DIFFERENTIAL/PLATELET
Absolute Monocytes: 660 cells/uL (ref 200–950)
Basophils Absolute: 60 cells/uL (ref 0–200)
Basophils Relative: 1 %
Eosinophils Absolute: 72 cells/uL (ref 15–500)
Eosinophils Relative: 1.2 %
HCT: 44.6 % (ref 38.5–50.0)
Hemoglobin: 15.1 g/dL (ref 13.2–17.1)
Lymphs Abs: 2172 cells/uL (ref 850–3900)
MCH: 31.5 pg (ref 27.0–33.0)
MCHC: 33.9 g/dL (ref 32.0–36.0)
MCV: 93.1 fL (ref 80.0–100.0)
MPV: 10.5 fL (ref 7.5–12.5)
Monocytes Relative: 11 %
Neutro Abs: 3036 cells/uL (ref 1500–7800)
Neutrophils Relative %: 50.6 %
Platelets: 242 10*3/uL (ref 140–400)
RBC: 4.79 10*6/uL (ref 4.20–5.80)
RDW: 12.4 % (ref 11.0–15.0)
Total Lymphocyte: 36.2 %
WBC: 6 10*3/uL (ref 3.8–10.8)

## 2020-02-18 LAB — T4, FREE: Free T4: 1.5 ng/dL (ref 0.8–1.8)

## 2020-02-18 LAB — TSH: TSH: 1.92 mIU/L (ref 0.40–4.50)

## 2020-02-18 MED ORDER — MECLIZINE HCL 12.5 MG PO TABS: 12.5000 mg | ORAL_TABLET | Freq: Three times a day (TID) | ORAL | 2 refills | Status: DC | PRN

## 2020-02-18 MED ORDER — SPIRIVA HANDIHALER 18 MCG IN CAPS: 18.0000 ug | ORAL_CAPSULE | Freq: Every day | RESPIRATORY_TRACT | 12 refills | Status: DC
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
## 2020-02-18 NOTE — Patient Instructions (Signed)
Please take all new medication as prescribed - the meclizine and spiriva  Please continue all other medications as before, and refills have been done if requested.  Please have the pharmacy call with any other refills you may need.  Please continue your efforts at being more active, low cholesterol diet, and weight control.  You are otherwise up to date with prevention measures today.  Please keep your appointments with your specialists as you may have planned  Please go to the XRAY Department in the first floor for the x-ray testing  Please go to the LAB at the blood drawing area for the tests to be done - for the thyroid and other blood counts  You will be contacted by phone if any changes need to be made immediately.  Otherwise, you will receive a letter about your results with an explanation, but please check with MyChart first.  Please remember to sign up for MyChart if you have not done so, as this will be important to you in the future with finding out test results, communicating by private email, and scheduling acute appointments online when needed.  Please make an Appointment to return in 6 months, or sooner if needed

## 2020-02-19 ENCOUNTER — Encounter: Payer: Self-pay | Admitting: Internal Medicine

## 2020-02-20 ENCOUNTER — Encounter: Payer: Self-pay | Admitting: Internal Medicine

## 2020-02-20 NOTE — Assessment & Plan Note (Addendum)
For meclizine prn  I spent 41 minutes in preparing to see the patient by review of recent labs, imaging and procedures, obtaining and reviewing separately obtained history, communicating with the patient and family or caregiver, ordering medications, tests or procedures, and documenting clinical information in the EHR including the differential Dx, treatment, and any further evaluation and other management of vertigo, hyperglycemia, hld, hypothryoidism, copd/dyspnea, bradycardia

## 2020-02-20 NOTE — Assessment & Plan Note (Signed)
Declines lipid clinic

## 2020-02-20 NOTE — Assessment & Plan Note (Signed)
As above.

## 2020-02-20 NOTE — Assessment & Plan Note (Signed)
Also for f/u TFTs

## 2020-02-20 NOTE — Assessment & Plan Note (Signed)
With worsening symptoms sob, doe, - for add spiriva daily

## 2020-02-20 NOTE — Assessment & Plan Note (Signed)
stable overall by history and exam, recent data reviewed with pt, and pt to continue medical treatment as before,  to f/u any worsening symptoms or concerns  

## 2020-02-20 NOTE — Assessment & Plan Note (Signed)
Chronic stable, not felt likley to be related to worsening dyspnea or dizziness

## 2020-02-22 ENCOUNTER — Emergency Department (HOSPITAL_COMMUNITY)
Admission: EM | Admit: 2020-02-22 | Discharge: 2020-02-22 | Disposition: A | Discharge status: Home / Self Care | Payer: Medicare Other | Attending: Emergency Medicine | Admitting: Emergency Medicine

## 2020-02-22 ENCOUNTER — Ambulatory Visit (INDEPENDENT_AMBULATORY_CARE_PROVIDER_SITE_OTHER): Payer: Medicare Other

## 2020-02-22 ENCOUNTER — Encounter (HOSPITAL_COMMUNITY): Payer: Self-pay | Admitting: Cardiology

## 2020-02-22 DIAGNOSIS — Z0000 Encounter for general adult medical examination without abnormal findings: Secondary | ICD-10-CM | POA: Diagnosis not present

## 2020-02-22 DIAGNOSIS — R55 Syncope and collapse: Secondary | ICD-10-CM | POA: Diagnosis present

## 2020-02-22 DIAGNOSIS — Z87891 Personal history of nicotine dependence: Secondary | ICD-10-CM | POA: Insufficient documentation

## 2020-02-22 DIAGNOSIS — E039 Hypothyroidism, unspecified: Secondary | ICD-10-CM | POA: Insufficient documentation

## 2020-02-22 DIAGNOSIS — Z79890 Hormone replacement therapy (postmenopausal): Secondary | ICD-10-CM | POA: Diagnosis not present

## 2020-02-22 DIAGNOSIS — J449 Chronic obstructive pulmonary disease, unspecified: Secondary | ICD-10-CM | POA: Diagnosis not present

## 2020-02-22 DIAGNOSIS — Z7951 Long term (current) use of inhaled steroids: Secondary | ICD-10-CM | POA: Insufficient documentation

## 2020-02-22 DIAGNOSIS — I480 Paroxysmal atrial fibrillation: Secondary | ICD-10-CM | POA: Diagnosis not present

## 2020-02-22 LAB — TSH: TSH: 4.982 u[IU]/mL — ABNORMAL HIGH (ref 0.350–4.500)

## 2020-02-22 LAB — CBC WITH DIFFERENTIAL/PLATELET
Abs Immature Granulocytes: 0.02 10*3/uL (ref 0.00–0.07)
Basophils Absolute: 0.1 10*3/uL (ref 0.0–0.1)
Basophils Relative: 1 %
Eosinophils Absolute: 0.1 10*3/uL (ref 0.0–0.5)
Eosinophils Relative: 1 %
HCT: 45.1 % (ref 39.0–52.0)
Hemoglobin: 15.3 g/dL (ref 13.0–17.0)
Immature Granulocytes: 0 %
Lymphocytes Relative: 24 %
Lymphs Abs: 2 10*3/uL (ref 0.7–4.0)
MCH: 31.7 pg (ref 26.0–34.0)
MCHC: 33.9 g/dL (ref 30.0–36.0)
MCV: 93.6 fL (ref 80.0–100.0)
Monocytes Absolute: 0.8 10*3/uL (ref 0.1–1.0)
Monocytes Relative: 10 %
Neutro Abs: 5.3 10*3/uL (ref 1.7–7.7)
Neutrophils Relative %: 64 %
Platelets: 261 10*3/uL (ref 150–400)
RBC: 4.82 MIL/uL (ref 4.22–5.81)
RDW: 12.6 % (ref 11.5–15.5)
WBC: 8.3 10*3/uL (ref 4.0–10.5)
nRBC: 0 % (ref 0.0–0.2)

## 2020-02-22 LAB — MAGNESIUM: Magnesium: 2.1 mg/dL (ref 1.7–2.4)

## 2020-02-22 LAB — BASIC METABOLIC PANEL
Anion gap: 11 (ref 5–15)
BUN: 15 mg/dL (ref 8–23)
CO2: 21 mmol/L — ABNORMAL LOW (ref 22–32)
Calcium: 9.1 mg/dL (ref 8.9–10.3)
Chloride: 106 mmol/L (ref 98–111)
Creatinine, Ser: 0.77 mg/dL (ref 0.61–1.24)
GFR calc Af Amer: >60 mL/min (ref >60)
GFR calc non Af Amer: >60 mL/min (ref >60)
Glucose, Bld: 108 mg/dL — ABNORMAL HIGH (ref 70–99)
Potassium: 3.5 mmol/L (ref 3.5–5.1)
Sodium: 138 mmol/L (ref 135–145)

## 2020-02-22 MED ORDER — SODIUM CHLORIDE 0.9 % IV SOLN: INTRAVENOUS | Status: DC

## 2020-02-22 MED ORDER — PROPOFOL 10 MG/ML IV BOLUS: 0.5000 mg/kg | Freq: Once | INTRAVENOUS | Status: DC

## 2020-02-22 MED ORDER — FENTANYL CITRATE (PF) 100 MCG/2ML IJ SOLN: 50.0000 ug | Freq: Once | INTRAMUSCULAR | Status: DC

## 2020-02-22 NOTE — ED Notes (Signed)
Pt resting quietly.  NSR.  Dr. Wilson Singer updated on rhythm.
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
## 2020-02-22 NOTE — Patient Instructions (Addendum)
[name] , Thank you for taking time to come for your Medicare Wellness Visit. I appreciate your ongoing commitment to your health goals. Please review the following plan we discussed and let me know if I can assist you in the future.   Screening recommendations/referrals: Colonoscopy: 02/06/2017; due every 5 years Recommended yearly ophthalmology/optometry visit for glaucoma screening and checkup Recommended yearly dental visit for hygiene and checkup  Vaccinations: Influenza vaccine: 04/06/2019; will check with local pharmacy for High-Dose Flu Vaccine Pneumococcal vaccine: completed Tdap vaccine: 08/14/2018; due every 10 years Shingles vaccine: never done; will check with local pharmacy   Covid-19: completed; will get booster once available  Advanced directives: Please bring a copy of your health care power of attorney and living will to the office at your convenience.  Conditions/risks identified:  Yes. Reviewed health maintenance screenings with patient today and relevant education, vaccines, and/or referrals were provided. Continue doing brain stimulating activities (puzzles, reading, adult coloring books, staying active) to keep memory sharp. Continue to eat heart healthy diet (full of fruits, vegetables, whole grains, lean protein, water--limit salt, fat, and sugar intake) and increase physical activity as tolerated.  Next appointment: Please schedule your next Medicare Wellness Visit with your Nurse Health Advisor in 1 year.   Preventive Care 68 Years and Older, Male Preventive care refers to lifestyle choices and visits with your health care provider that can promote health and wellness. What does preventive care include?  A yearly physical exam. This is also called an annual well check.  Dental exams once or twice a year.  Routine eye exams. Ask your health care provider how often you should have your eyes checked.  Personal lifestyle choices, including:  Daily care of your  teeth and gums.  Regular physical activity.  Eating a healthy diet.  Avoiding tobacco and drug use.  Limiting alcohol use.  Practicing safe sex.  Taking low doses of aspirin every day.  Taking vitamin and mineral supplements as recommended by your health care provider. What happens during an annual well check? The services and screenings done by your health care provider during your annual well check will depend on your age, overall health, lifestyle risk factors, and family history of disease. Counseling  Your health care provider may ask you questions about your:  Alcohol use.  Tobacco use.  Drug use.  Emotional well-being.  Home and relationship well-being.  Sexual activity.  Eating habits.  History of falls.  Memory and ability to understand (cognition).  Work and work Statistician. Screening  You may have the following tests or measurements:  Height, weight, and BMI.  Blood pressure.  Lipid and cholesterol levels. These may be checked every 5 years, or more frequently if you are over 42 years old.  Skin check.  Lung cancer screening. You may have this screening every year starting at age 86 if you have a 30-pack-year history of smoking and currently smoke or have quit within the past 15 years.  Fecal occult blood test (FOBT) of the stool. You may have this test every year starting at age 84.  Flexible sigmoidoscopy or colonoscopy. You may have a sigmoidoscopy every 5 years or a colonoscopy every 10 years starting at age 12.  Prostate cancer screening. Recommendations will vary depending on your family history and other risks.  Hepatitis C blood test.  Hepatitis B blood test.  Sexually transmitted disease (STD) testing.  Diabetes screening. This is done by checking your blood sugar (glucose) after you have not eaten  for a while (fasting). You may have this done every 1-3 years.  Abdominal aortic aneurysm (AAA) screening. You may need this if you  are a current or former smoker.  Osteoporosis. You may be screened starting at age 67 if you are at high risk. Talk with your health care provider about your test results, treatment options, and if necessary, the need for more tests. Vaccines  Your health care provider may recommend certain vaccines, such as:  Influenza vaccine. This is recommended every year.  Tetanus, diphtheria, and acellular pertussis (Tdap, Td) vaccine. You may need a Td booster every 10 years.  Zoster vaccine. You may need this after age 30.  Pneumococcal 13-valent conjugate (PCV13) vaccine. One dose is recommended after age 21.  Pneumococcal polysaccharide (PPSV23) vaccine. One dose is recommended after age 26. Talk to your health care provider about which screenings and vaccines you need and how often you need them. This information is not intended to replace advice given to you by your health care provider. Make sure you discuss any questions you have with your health care provider. Document Released: 06/24/2015 Document Revised: 02/15/2016 Document Reviewed: 03/29/2015 Elsevier Interactive Patient Education  2017 [place] Prevention in the Home Falls can cause injuries. They can happen to people of all ages. There are many things you can do to make your home safe and to help prevent falls. What can I do on the outside of my home?  Regularly fix the edges of walkways and driveways and fix any cracks.  Remove anything that might make you trip as you walk through a door, such as a raised step or threshold.  Trim any bushes or trees on the path to your home.  Use bright outdoor lighting.  Clear any walking paths of anything that might make someone trip, such as rocks or tools.  Regularly check to see if handrails are loose or broken. Make sure that both sides of any steps have handrails.  Any raised decks and porches should have guardrails on the edges.  Have any leaves, snow, or ice cleared  regularly.  Use sand or salt on walking paths during winter.  Clean up any spills in your garage right away. This includes oil or grease spills. What can I do in the bathroom?  Use night lights.  Install grab bars by the toilet and in the tub and shower. Do not use towel bars as grab bars.  Use non-skid mats or decals in the tub or shower.  If you need to sit down in the shower, use a plastic, non-slip stool.  Keep the floor dry. Clean up any water that spills on the floor as soon as it happens.  Remove soap buildup in the tub or shower regularly.  Attach bath mats securely with double-sided non-slip rug tape.  Do not have throw rugs and other things on the floor that can make you trip. What can I do in the bedroom?  Use night lights.  Make sure that you have a light by your bed that is easy to reach.  Do not use any sheets or blankets that are too big for your bed. They should not hang down onto the floor.  Have a firm chair that has side arms. You can use this for support while you get dressed.  Do not have throw rugs and other things on the floor that can make you trip. What can I do in the kitchen?  Clean up any spills right  away.  Avoid walking on wet floors.  Keep items that you use a lot in easy-to-reach places.  If you need to reach something above you, use a strong step stool that has a grab bar.  Keep electrical cords out of the way.  Do not use floor polish or wax that makes floors slippery. If you must use wax, use non-skid floor wax.  Do not have throw rugs and other things on the floor that can make you trip. What can I do with my stairs?  Do not leave any items on the stairs.  Make sure that there are handrails on both sides of the stairs and use them. Fix handrails that are broken or loose. Make sure that handrails are as long as the stairways.  Check any carpeting to make sure that it is firmly attached to the stairs. Fix any carpet that is loose  or worn.  Avoid having throw rugs at the top or bottom of the stairs. If you do have throw rugs, attach them to the floor with carpet tape.  Make sure that you have a light switch at the top of the stairs and the bottom of the stairs. If you do not have them, ask someone to add them for you. What else can I do to help prevent falls?  Wear shoes that:  Do not have high heels.  Have rubber bottoms.  Are comfortable and fit you well.  Are closed at the toe. Do not wear sandals.  If you use a stepladder:  Make sure that it is fully opened. Do not climb a closed stepladder.  Make sure that both sides of the stepladder are locked into place.  Ask someone to hold it for you, if possible.  Clearly mark and make sure that you can see:  Any grab bars or handrails.  First and last steps.  Where the edge of each step is.  Use tools that help you move around (mobility aids) if they are needed. These include:  Canes.  Walkers.  Scooters.  Crutches.  Turn on the lights when you go into a dark area. Replace any light bulbs as soon as they burn out.  Set up your furniture so you have a clear path. Avoid moving your furniture around.  If any of your floors are uneven, fix them.  If there are any pets around you, be aware of where they are.  Review your medicines with your doctor. Some medicines can make you feel dizzy. This can increase your chance of falling. Ask your doctor what other things that you can do to help prevent falls. This information is not intended to replace advice given to you by your health care provider. Make sure you discuss any questions you have with your health care provider. Document Released: 03/24/2009 Document Revised: 11/03/2015 Document Reviewed: 07/02/2014 Elsevier Interactive Patient Education  2017 [employer].

## 2020-02-22 NOTE — ED Triage Notes (Signed)
Outside working tonight and had near syncopal episode.  Per pt his heart rate was 199 at home.  Per EMS heart rate anywhere from 100-165.  NSR ,  Atrial fib,  Atrial flutter.  PT asymptomatic. EMS gave 500 cc bolus.   Ems did vagal maneuvers with no change.

## 2020-02-22 NOTE — ED Notes (Signed)
Wife at bedside.   Pt continues NSR.  Updated on wait.

## 2020-02-23 ENCOUNTER — Telehealth: Payer: Self-pay | Admitting: Internal Medicine

## 2020-02-23 NOTE — Telephone Encounter (Signed)
I have spoke to pt's wife and informed her that it would be better if he followed up with the specialist since the pt is in a-fib.

## 2020-02-23 NOTE — Telephone Encounter (Signed)
[facility] hospital yesterday, at home In afib Should they see Dr. Jenny Reichmann, or the Cardiologist [facility]) instead

## 2020-02-25 ENCOUNTER — Other Ambulatory Visit: Payer: Self-pay | Admitting: Internal Medicine

## 2020-02-25 MED ORDER — LEVOTHYROXINE SODIUM 150 MCG PO TABS: 150.0000 ug | ORAL_TABLET | Freq: Every day | ORAL | 1 refills | Status: DC

## 2020-02-25 NOTE — Telephone Encounter (Signed)
    Patient requesting refill for levothyroxine (EUTHYROX) 150 MCG tablet, pharmacy [place], [place] - [address] [place]
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
## 2020-03-27 DIAGNOSIS — I480 Paroxysmal atrial fibrillation: Secondary | ICD-10-CM | POA: Insufficient documentation

## 2020-03-27 DIAGNOSIS — I6523 Occlusion and stenosis of bilateral carotid arteries: Secondary | ICD-10-CM | POA: Insufficient documentation
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
## 2020-03-29 ENCOUNTER — Other Ambulatory Visit: Payer: Self-pay

## 2020-03-29 ENCOUNTER — Encounter: Payer: Self-pay | Admitting: Cardiology

## 2020-03-29 ENCOUNTER — Ambulatory Visit (INDEPENDENT_AMBULATORY_CARE_PROVIDER_SITE_OTHER): Payer: Medicare Other | Admitting: Cardiology

## 2020-03-29 VITALS — BP 126/70 | HR 61 | Ht 67.5 in

## 2020-03-29 DIAGNOSIS — R072 Precordial pain: Secondary | ICD-10-CM

## 2020-03-29 DIAGNOSIS — I6523 Occlusion and stenosis of bilateral carotid arteries: Secondary | ICD-10-CM | POA: Diagnosis not present

## 2020-03-29 DIAGNOSIS — I480 Paroxysmal atrial fibrillation: Secondary | ICD-10-CM

## 2020-03-29 DIAGNOSIS — R001 Bradycardia, unspecified: Secondary | ICD-10-CM | POA: Diagnosis not present

## 2020-03-29 DIAGNOSIS — E785 Hyperlipidemia, unspecified: Secondary | ICD-10-CM | POA: Diagnosis not present

## 2020-03-29 MED ORDER — METOPROLOL TARTRATE 100 MG PO TABS: ORAL_TABLET | ORAL | 0 refills | Status: DC

## 2020-03-29 NOTE — Patient Instructions (Addendum)
Medication Instructions:  No changes  *If you need a refill on your cardiac medications before your next appointment, please call your pharmacy*   Lab Work: Your provider would like for you to have the following labs today: T3, T4, BNP  If you have labs (blood work) drawn today and your tests are completely normal, you will receive your results only by: Marland Kitchen MyChart Message (if you have MyChart) OR . A paper copy in the mail If you have any lab test that is abnormal or we need to change your treatment, we will call you to review the results.  Test: Your physician has requested that you have a carotid duplex. This test is an ultrasound of the carotid arteries in your neck. It looks at blood flow through these arteries that supply the brain with blood. Allow one hour for this exam. There are no restrictions or special instructions. This will take place at [facility] [place], [address].   Follow-Up: At [facility], you and your health needs are our priority.  As part of our continuing mission to provide you with exceptional heart care, we have created designated Provider Care Teams.  These Care Teams include your primary Cardiologist (physician) and Advanced Practice Providers (APPs -  Physician Assistants and Nurse Practitioners) who all work together to provide you with the care you need, when you need it.  We recommend signing up for the patient portal called "MyChart".  Sign up information is provided on this After Visit Summary.  MyChart is used to connect with patients for Virtual Visits (Telemedicine).  Patients are able to view lab/test results, encounter notes, upcoming appointments, etc.  Non-urgent messages can be sent to your provider as well.   To learn more about what you can do with MyChart, go to [email or web address].    Your next appointment:   Follow up with pharmD for possible Repatha or Praluent Follow up with [name] after testing.    Other Instructions Your  cardiac CT will be scheduled at one of the below locations:   [facility] [address] [place], [place] [address] [phone]  [facility] [address] [place], [place] [address] [phone]  If scheduled at [facility], please arrive at the [facility] main entrance of [facility] 30 minutes prior to test start time. Proceed to the [facility] Radiology Department (first floor) to check-in and test prep.  If scheduled at [facility], please arrive 15 mins early for check-in and test prep.  Please follow these instructions carefully (unless otherwise directed):   On the Night Before the Test: . Be sure to Drink plenty of water. . Do not consume any caffeinated/decaffeinated beverages or chocolate 12 hours prior to your test. . Do not take any antihistamines 12 hours prior to your test.   On the Day of the Test: . Drink plenty of water. Do not drink any water within one hour of the test. . Do not eat any food 4 hours prior to the test. . You may take your regular medications prior to the test.  . Take metoprolol (Lopressor) two hours prior to test. If your heart rate is in the 40-50's, please do not take the Metoprolol.         After the Test: . Drink plenty of water. . After receiving IV contrast, you may experience a mild flushed feeling. This is normal. . On occasion, you may  experience a mild rash up to 24 hours after the test. This is not dangerous. If this occurs, you can take Benadryl 25 mg and increase your fluid intake. . If you experience trouble breathing, this can be serious. If it is severe call 911 IMMEDIATELY. If it is mild, please call our office. . If you take any of these medications: Glipizide/Metformin, Avandament, Glucavance, please do not take 48 hours after completing test unless otherwise instructed.   Once we have confirmed authorization  from your insurance company, we will call you to set up a date and time for your test. Based on how quickly your insurance processes prior authorizations requests, please allow up to 4 weeks to be contacted for scheduling your Cardiac CT appointment. Be advised that routine Cardiac CT appointments could be scheduled as many as 8 weeks after your provider has ordered it.  For non-scheduling related questions, please contact the cardiac imaging nurse navigator should you have any questions/concerns: Marchia Bond, Cardiac Imaging Nurse Navigator Burley Saver, Interim Cardiac Imaging Nurse [place] [facility] Direct Office Dial: [phone]   For scheduling needs, including cancellations and rescheduling, please call Vivien Rota at [phone], option 3.

## 2020-03-30 LAB — T4, FREE: Free T4: 1.26 ng/dL (ref 0.82–1.77)

## 2020-03-30 LAB — T3, FREE: T3, Free: 2.5 pg/mL (ref 2.0–4.4)

## 2020-03-31 ENCOUNTER — Telehealth: Payer: Self-pay | Admitting: *Deleted

## 2020-03-31 NOTE — Telephone Encounter (Signed)
At the patient's last office visit with [name], a cardiac ct was ordered. The patient currently has Medicare. A notice was received that Medicare may not pay for the FFR portion of the test and it may cost the patient $4000. The patient has signed the Non-Coverage form stating he does not want to do the second and third portion out of concern that he may have to pay the bill. The from has been sent to Medical Records to be scanned.

## 2020-04-04 ENCOUNTER — Encounter: Payer: Self-pay | Admitting: *Deleted

## 2020-04-13 ENCOUNTER — Ambulatory Visit (HOSPITAL_COMMUNITY)
Admission: RE | Admit: 2020-04-13 | Discharge: 2020-04-13 | Disposition: A | Discharge status: Home / Self Care | Payer: Medicare Other | Source: Ambulatory Visit | Attending: Cardiology | Admitting: Cardiology

## 2020-04-13 ENCOUNTER — Other Ambulatory Visit: Payer: Self-pay

## 2020-04-13 ENCOUNTER — Other Ambulatory Visit: Payer: Self-pay | Admitting: Cardiology

## 2020-04-13 DIAGNOSIS — I6523 Occlusion and stenosis of bilateral carotid arteries: Secondary | ICD-10-CM | POA: Insufficient documentation

## 2020-04-13 DIAGNOSIS — R001 Bradycardia, unspecified: Secondary | ICD-10-CM

## 2020-04-13 DIAGNOSIS — R072 Precordial pain: Secondary | ICD-10-CM

## 2020-04-13 LAB — BASIC METABOLIC PANEL
BUN/Creatinine Ratio: 10 (ref 10–24)
BUN: 8 mg/dL (ref 8–27)
CO2: 24 mmol/L (ref 20–29)
Calcium: 9.5 mg/dL (ref 8.6–10.2)
Chloride: 103 mmol/L (ref 96–106)
Creatinine, Ser: 0.82 mg/dL (ref 0.76–1.27)
GFR calc Af Amer: 105 mL/min/{1.73_m2} (ref >59)
GFR calc non Af Amer: 91 mL/min/{1.73_m2} (ref >59)
Glucose: 87 mg/dL (ref 65–99)
Potassium: 5 mmol/L (ref 3.5–5.2)
Sodium: 138 mmol/L (ref 134–144)

## 2020-04-14 ENCOUNTER — Telehealth (HOSPITAL_COMMUNITY): Payer: Self-pay | Admitting: Emergency Medicine

## 2020-04-14 NOTE — Telephone Encounter (Signed)
Attempted to call patient regarding upcoming cardiac CT appointment. °Left message on voicemail with name and callback number °Kerrington Greenhalgh RN Navigator Cardiac Imaging °[facility] °[phone] Office °[phone] Cell ° °

## 2020-04-18 ENCOUNTER — Other Ambulatory Visit: Payer: Self-pay

## 2020-04-18 ENCOUNTER — Ambulatory Visit (HOSPITAL_COMMUNITY)
Admission: RE | Admit: 2020-04-18 | Discharge: 2020-04-18 | Disposition: A | Discharge status: Home / Self Care | Payer: Medicare Other | Source: Ambulatory Visit | Attending: Cardiology | Admitting: Cardiology

## 2020-04-18 ENCOUNTER — Encounter (HOSPITAL_COMMUNITY): Payer: Self-pay

## 2020-04-18 DIAGNOSIS — R072 Precordial pain: Secondary | ICD-10-CM | POA: Insufficient documentation

## 2020-04-18 DIAGNOSIS — I2510 Atherosclerotic heart disease of native coronary artery without angina pectoris: Secondary | ICD-10-CM | POA: Diagnosis not present

## 2020-04-18 MED ORDER — NITROGLYCERIN 0.4 MG SL SUBL
SUBLINGUAL_TABLET | SUBLINGUAL | Status: AC
  Filled 2020-04-18: qty 2

## 2020-04-18 MED ORDER — NITROGLYCERIN 0.4 MG SL SUBL
0.8000 mg | SUBLINGUAL_TABLET | Freq: Once | SUBLINGUAL | Status: AC
  Administered 2020-04-18: 0.8 mg via SUBLINGUAL

## 2020-04-18 MED ORDER — IOHEXOL 350 MG/ML SOLN
80.0000 mL | Freq: Once | INTRAVENOUS | Status: AC | PRN
  Administered 2020-04-18: 80 mL via INTRAVENOUS

## 2020-04-19 ENCOUNTER — Telehealth: Payer: Self-pay | Admitting: *Deleted

## 2020-04-19 DIAGNOSIS — R072 Precordial pain: Secondary | ICD-10-CM

## 2020-04-19 DIAGNOSIS — Z01812 Encounter for preprocedural laboratory examination: Secondary | ICD-10-CM

## 2020-04-19 DIAGNOSIS — I2510 Atherosclerotic heart disease of native coronary artery without angina pectoris: Secondary | ICD-10-CM | POA: Diagnosis not present

## 2020-04-19 NOTE — Telephone Encounter (Signed)
-----   Message from Minus Breeding, MD sent at 04/19/2020  1:50 PM EST ----- Severe three vessel CAD.  I reviewed the results with the patient had he needs a cardiac cath this week.  The patient understands that risks included but are not limited to stroke (1 in 1000), death (1 in 56), kidney failure [usually temporary] (1 in 500), bleeding (1 in 200), allergic reaction [possibly serious] (1 in 200).  The patient understands and agrees to proceed.   Please send results to Biagio Borg, MD

## 2020-04-19 NOTE — Telephone Encounter (Signed)
Patient returning call.

## 2020-04-19 NOTE — Telephone Encounter (Signed)
Patient scheduled for cath 11/15 at 7:30 with Dr End

## 2020-04-19 NOTE — Telephone Encounter (Signed)
Left message to call back  

## 2020-04-20 ENCOUNTER — Encounter: Payer: Self-pay | Admitting: *Deleted

## 2020-04-20 NOTE — Telephone Encounter (Signed)
Slater states he is returning Guy Morris's call.

## 2020-04-20 NOTE — Telephone Encounter (Signed)
Spoke with patient's wife. Provided mychart code to spouse. No further questions at this time.

## 2020-04-21 ENCOUNTER — Telehealth: Payer: Self-pay | Admitting: *Deleted

## 2020-04-21 ENCOUNTER — Other Ambulatory Visit (HOSPITAL_COMMUNITY)
Admission: RE | Admit: 2020-04-21 | Discharge: 2020-04-21 | Disposition: A | Discharge status: Home / Self Care | Payer: Medicare Other | Source: Ambulatory Visit | Attending: Internal Medicine | Admitting: Internal Medicine

## 2020-04-21 ENCOUNTER — Other Ambulatory Visit: Payer: Self-pay

## 2020-04-21 ENCOUNTER — Ambulatory Visit (INDEPENDENT_AMBULATORY_CARE_PROVIDER_SITE_OTHER): Payer: Medicare Other | Admitting: Pharmacist Clinician (PhC)/ Clinical Pharmacy Specialist

## 2020-04-21 VITALS — BP 134/72 | HR 98 | Resp 17 | Ht 68.0 in | Wt 185.4 lb

## 2020-04-21 DIAGNOSIS — I6523 Occlusion and stenosis of bilateral carotid arteries: Secondary | ICD-10-CM | POA: Diagnosis not present

## 2020-04-21 DIAGNOSIS — G720 Drug-induced myopathy: Secondary | ICD-10-CM

## 2020-04-21 DIAGNOSIS — Z20822 Contact with and (suspected) exposure to covid-19: Secondary | ICD-10-CM | POA: Diagnosis not present

## 2020-04-21 DIAGNOSIS — E785 Hyperlipidemia, unspecified: Secondary | ICD-10-CM

## 2020-04-21 DIAGNOSIS — T466X5A Adverse effect of antihyperlipidemic and antiarteriosclerotic drugs, initial encounter: Secondary | ICD-10-CM | POA: Diagnosis not present

## 2020-04-21 DIAGNOSIS — I4891 Unspecified atrial fibrillation: Secondary | ICD-10-CM | POA: Diagnosis not present

## 2020-04-21 DIAGNOSIS — R079 Chest pain, unspecified: Secondary | ICD-10-CM | POA: Diagnosis present

## 2020-04-21 LAB — SARS CORONAVIRUS 2 (TAT 6-24 HRS): SARS Coronavirus 2: NEGATIVE

## 2020-04-21 MED ORDER — NITROGLYCERIN 0.4 MG SL SUBL: 0.4000 mg | SUBLINGUAL_TABLET | SUBLINGUAL | 3 refills | Status: DC | PRN

## 2020-04-21 NOTE — Progress Notes (Signed)
04/22/2020 ERDEM NAAS 1951-03-12 [id number]   HPI:  Guy Morris is a 69 y.o. male patient of Dr Percival Spanish, who presents today for a lipid clinic evaluation.  See pertinent past medical history below.  He has recently been found to have ASCVD, with some stenosis in the left ICA as well as a coronary calcium score of 564.  He has been intolerant to multiple statin drugs in the past (see below).  Today he is here with his wife to discuss other options for lowering cholesterol.    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; Coronary calcium score of 564 - 80th percentile for age/sex  hypothyroidism 9/21 TSH 4.98 - currently on levothyroxine 150 mcg  LFT increase Chart notes that patient had increase in LFT's back in 2010, unknown if related to statin use, no recurrences in Epic labs 2012-present    Current Medications: none  Cholesterol Goals: LDL < 70   Intolerant/previously tried: atorvastatin, rosuvastatin, simvastatin, pravastatin - myalgias with all tried statins;  ezetimibe - doesn't recall any issues  Family history:  Father had 2 brothers who died "after starting statins", knows some members in family have dealt with elevated cholesterol.     Labs: 3/21:  TC 216, TG 134, HDL 45.1, LDL 144  History of LDL at 191 back in 2012   Current Outpatient Medications  Medication Sig Dispense Refill  . albuterol (PROVENTIL HFA;VENTOLIN HFA) 108 (90 Base) MCG/ACT inhaler Inhale 2 puffs into the lungs every 6 (six) hours as needed for wheezing or shortness of breath. 3 Inhaler 3  . aspirin EC 81 MG tablet Take 81 mg by mouth daily. Swallow whole.    . levothyroxine (EUTHYROX) 150 MCG tablet Take 1 tablet (150 mcg total) by mouth daily. 90 tablet 1  . meclizine (ANTIVERT) 12.5 MG tablet Take 1 tablet (12.5 mg total) by mouth 3 (three) times daily as needed for dizziness. 30 tablet 2   . ascorbic acid (VITAMIN C) 500 MG tablet Take 500 mg by mouth daily.    . Ca Carbonate-Mag Hydroxide (ROLAIDS PO) Take 1 tablet by mouth 3 (three) times daily as needed (heartburn).    . ezetimibe (ZETIA) 10 MG tablet Take 10 mg by mouth daily.    . naproxen sodium (ALEVE) 220 MG tablet Take 220 mg by mouth daily as needed (pain).    . nitroGLYCERIN (NITROSTAT) 0.4 MG SL tablet Place 1 tablet (0.4 mg total) under the tongue every 5 (five) minutes as needed for chest pain. 90 tablet 3  . Turmeric Curcumin 500 MG CAPS Take 1,000 mg by mouth daily.     No current facility-administered medications for this visit.    Allergies  Allergen Reactions  . Atorvastatin     myalgia  . Claritin [Loratadine]     dizzy  . Rofecoxib Itching  . Rosuvastatin     myalgia  . Zocor [Simvastatin] Other (See Comments)    myalgia    Past Medical History:  Diagnosis Date  . ABNORMAL TRANSAMINASE-LFT'S 08/04/2008   Qualifier: Diagnosis of  By: Fuller Plan MD FACG, [place] RHINITIS 05/06/2007   Qualifier: Diagnosis of  By: Jenny Reichmann MD, Hunt Oris   . Allergy   . BPH (benign prostatic hyperplasia) 04/28/2012  . COPD (chronic obstructive pulmonary disease) ([place])   . Degenerative joint disease 04/23/2011  . ERECTILE DYSFUNCTION 05/06/2007  Qualifier: Diagnosis of  By: Jenny Reichmann MD, Hunt Oris   . GERD 05/06/2007   Qualifier: Diagnosis of  By: Jenny Reichmann MD, Hunt Oris   . HYPERLIPIDEMIA 05/06/2007   Qualifier: Diagnosis of  By: Jenny Reichmann MD, Hunt Oris   . HYPOTHYROIDISM 05/06/2007   Qualifier: Diagnosis of  By: Jenny Reichmann MD, Hunt Oris   . Insomnia 04/28/2012  . OSTEOARTHRITIS, KNEE, 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   Qualifier: Diagnosis of  By: Jenny Reichmann MD, Hunt Oris   . Personal history of colonic polyps 07/20/2008   Centricity Description: PERSONAL HX COLONIC POLYPS Qualifier: Diagnosis of  By: Ardis Hughs MD, Melene Plan  Centricity Description: COLONIC POLYPS, HX OF Qualifier: Diagnosis of  By: Jenny Reichmann MD, Hunt Oris   . RLS (restless legs syndrome) 10/17/2010   . Urge incontinence 04/28/2012   vesicare heps per urology    Blood pressure 134/72, pulse 98, resp. rate 17, height 5\' 8"  (1.727 m), weight 185 lb 6.4 oz (84.1 kg), SpO2 93 %.   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  Reviewed options for lowering LDL cholesterol, including ezetimibe, PCSK-9 inhibitors and bempedoic acid.  Discussed mechanisms of action, dosing, side effects and potential decreases in LDL cholesterol.  Answered all patient questions.  Unfortunately patient does not have a part D drug plan, just Medicare A and B.  Explained the need for patient to consider a Part D plan, as the manufacturer will generally not give assistance for more than just a short time if patient fails to enroll.  For now we will start him on ezetimibe 10 mg daily.  He will look into Medicare supplements and let us know by January 1 any new insurance information.  He will also repeat labs around the first of the new year, so that we have current labs to submit a PA.     Tommy Medal PharmD CPP [facility] [address] [place], [place] [address] [phone]

## 2020-04-21 NOTE — Telephone Encounter (Signed)
Pt contacted pre-catheterization scheduled at [facility] for: Monday April 25, 2020 7:30 AM Verified arrival time and place: [place] [facility]) at: 5:30 AM   No solid food after midnight prior to cath, clear liquids until 5 AM day of procedure.   AM meds can be  taken pre-cath with sips of water including: ASA 81 mg   Confirmed patient has responsible adult to drive home post procedure and be with patient first 24 hours after arriving home: yes  You are allowed ONE visitor in the waiting room during the time you are at the hospital for your procedure. Both you and your visitor must wear a mask once you enter the hospital.       COVID-19 Pre-Screening Questions:  . In the past 14 days have you had a new cough, new headache, new nasal congestion, fever (100.4 or greater) unexplained body aches, new sore throat, or sudden loss of taste or sense of smell? no . In the past 14 days have you been around anyone with known Covid 19? no     Reviewed procedure/mask/visitor instructions, COVID-19 questions with patient.

## 2020-04-21 NOTE — Telephone Encounter (Signed)
Pt contacted pre-catheterization scheduled at [facility] for: Monday April 25, 2020 7:30 AM Verified arrival time and place: [place] [facility]) at: 5:30 AM   No solid food after midnight prior to cath, clear liquids until 5 AM day of procedure.   AM meds can be  taken pre-cath with sips of water including: ASA 81 mg   Confirmed patient has responsible adult to drive home post procedure and be with patient first 24 hours after arriving home:  You are allowed ONE visitor in the waiting room during the time you are at the hospital for your procedure. Both you and your visitor must wear a mask once you enter the hospital.       COVID-19 Pre-Screening Questions:  . In the past 14 days have you had a new cough, new headache, new nasal congestion, fever (100.4 or greater) unexplained body aches, new sore throat, or sudden loss of taste or sense of smell? Marland Kitchen In the past 14 days have you been around anyone with known Covid 19?    LMTCB to review procedure instructions with patient.

## 2020-04-21 NOTE — Telephone Encounter (Addendum)
Copied from My Chart message 04/20/20-This does demonstrate atrial fib. Eventually he will need DOAC. However, pending his cath, I will not start this. Please ask him to start ASA 81 mg daily prior to his cath however. Thanks. [name]          Discussed above with patient.         Patient reports he did start taking ASA 81 mg daily yesterday and knows to continue for now.         Pt reports episode of left jaw and pain behind his left ear earlier today, no other associated symptoms, lasted about 5 minutes.        Pt states he does not currently have NTG to take prn.        Pt advised I will forward to Dr Percival Spanish for his review, recommendations, and see if he wants to prescribe NTG for the patient to take prn.

## 2020-04-21 NOTE — Telephone Encounter (Signed)
Needs ASA 81 mg and SLNTG

## 2020-04-21 NOTE — Telephone Encounter (Addendum)
Pt aware I will send in prescription for SL NTG to take prn -we discussed instructions for using NTG prn for jaw/ear/chest pain  Pt knows if symptoms not relieved with SL NTG every 5 minutes X 3, or if symptoms worsen,  he should call 911.

## 2020-04-21 NOTE — Patient Instructions (Addendum)
Your Results:             Your most recent labs Goal  Total Cholesterol 216 < 200  Triglycerides 134 < 150  HDL (happy/good cholesterol) 45.1 > 40  LDL (lousy/bad cholesterol 144 < 70    Look at getting a Medicare Complete or Medicare Part D drug plan.  Please call us once you have plan information and we can process a prescription for Repatha or Praluent.  Call us (Zhane Donlan/Raquel) at [phone].  Start ezetimibe 10 mg once daily.  This will help decrease the amount of cholesterol absorbed from your diet.   Thank you for choosing [facility]

## 2020-04-22 ENCOUNTER — Encounter: Payer: Self-pay | Admitting: Pharmacist Clinician (PhC)/ Clinical Pharmacy Specialist

## 2020-04-22 LAB — BASIC METABOLIC PANEL
BUN/Creatinine Ratio: 15 (ref 10–24)
BUN: 10 mg/dL (ref 8–27)
CO2: 22 mmol/L (ref 20–29)
Calcium: 9.8 mg/dL (ref 8.6–10.2)
Chloride: 101 mmol/L (ref 96–106)
Creatinine, Ser: 0.68 mg/dL — ABNORMAL LOW (ref 0.76–1.27)
GFR calc Af Amer: 113 mL/min/{1.73_m2} (ref >59)
GFR calc non Af Amer: 98 mL/min/{1.73_m2} (ref >59)
Glucose: 93 mg/dL (ref 65–99)
Potassium: 5.1 mmol/L (ref 3.5–5.2)
Sodium: 137 mmol/L (ref 134–144)

## 2020-04-22 LAB — CBC WITH DIFFERENTIAL/PLATELET
Basophils Absolute: 0.1 10*3/uL (ref 0.0–0.2)
Basos: 1 %
EOS (ABSOLUTE): 0.1 10*3/uL (ref 0.0–0.4)
Eos: 2 %
Hematocrit: 46.4 % (ref 37.5–51.0)
Hemoglobin: 15.9 g/dL (ref 13.0–17.7)
Immature Grans (Abs): 0 10*3/uL (ref 0.0–0.1)
Immature Granulocytes: 0 %
Lymphocytes Absolute: 1.8 10*3/uL (ref 0.7–3.1)
Lymphs: 29 %
MCH: 31.5 pg (ref 26.6–33.0)
MCHC: 34.3 g/dL (ref 31.5–35.7)
MCV: 92 fL (ref 79–97)
Monocytes Absolute: 0.7 10*3/uL (ref 0.1–0.9)
Monocytes: 11 %
Neutrophils Absolute: 3.5 10*3/uL (ref 1.4–7.0)
Neutrophils: 57 %
Platelets: 255 10*3/uL (ref 150–450)
RBC: 5.04 x10E6/uL (ref 4.14–5.80)
RDW: 12.1 % (ref 11.6–15.4)
WBC: 6.1 10*3/uL (ref 3.4–10.8)

## 2020-04-22 NOTE — Assessment & Plan Note (Signed)
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  Reviewed options for lowering LDL cholesterol, including ezetimibe, PCSK-9 inhibitors and bempedoic acid.  Discussed mechanisms of action, dosing, side effects and potential decreases in LDL cholesterol.  Answered all patient questions.  Unfortunately patient does not have a part D drug plan, just Medicare A and B.  Explained the need for patient to consider a Part D plan, as the manufacturer will generally not give assistance for more than just a short time if patient fails to enroll.  For now we will start him on ezetimibe 10 mg daily.  He will look into Medicare supplements and let us know by January 1 any new insurance information.  He will also repeat labs around the first of the new year, so that we have current labs to submit a PA.

## 2020-04-25 ENCOUNTER — Inpatient Hospital Stay (HOSPITAL_COMMUNITY)
Admission: RE | Admit: 2020-04-25 | Discharge: 2020-05-11 | DRG: 234 | Disposition: A | Discharge status: Home Health | Payer: Medicare Other | Attending: Cardiothoracic Surgery | Admitting: Cardiothoracic Surgery

## 2020-04-25 ENCOUNTER — Encounter (HOSPITAL_COMMUNITY)
Admission: RE | Disposition: A | Discharge status: Home Health | Payer: Self-pay | Source: Home / Self Care | Attending: Cardiothoracic Surgery

## 2020-04-25 ENCOUNTER — Other Ambulatory Visit: Payer: Self-pay | Admitting: *Deleted

## 2020-04-25 ENCOUNTER — Inpatient Hospital Stay (HOSPITAL_COMMUNITY): Payer: Medicare Other

## 2020-04-25 ENCOUNTER — Encounter (HOSPITAL_COMMUNITY): Payer: Self-pay | Admitting: Internal Medicine

## 2020-04-25 DIAGNOSIS — I495 Sick sinus syndrome: Secondary | ICD-10-CM | POA: Diagnosis not present

## 2020-04-25 DIAGNOSIS — R6884 Jaw pain: Secondary | ICD-10-CM | POA: Diagnosis present

## 2020-04-25 DIAGNOSIS — I2510 Atherosclerotic heart disease of native coronary artery without angina pectoris: Secondary | ICD-10-CM

## 2020-04-25 DIAGNOSIS — I4891 Unspecified atrial fibrillation: Secondary | ICD-10-CM

## 2020-04-25 DIAGNOSIS — E8770 Fluid overload, unspecified: Secondary | ICD-10-CM | POA: Diagnosis not present

## 2020-04-25 DIAGNOSIS — I480 Paroxysmal atrial fibrillation: Secondary | ICD-10-CM

## 2020-04-25 DIAGNOSIS — K529 Noninfective gastroenteritis and colitis, unspecified: Secondary | ICD-10-CM | POA: Diagnosis present

## 2020-04-25 DIAGNOSIS — G4733 Obstructive sleep apnea (adult) (pediatric): Secondary | ICD-10-CM | POA: Diagnosis present

## 2020-04-25 DIAGNOSIS — D689 Coagulation defect, unspecified: Secondary | ICD-10-CM | POA: Diagnosis present

## 2020-04-25 DIAGNOSIS — I482 Chronic atrial fibrillation, unspecified: Secondary | ICD-10-CM | POA: Diagnosis present

## 2020-04-25 DIAGNOSIS — H5461 Unqualified visual loss, right eye, normal vision left eye: Secondary | ICD-10-CM | POA: Diagnosis present

## 2020-04-25 DIAGNOSIS — K5909 Other constipation: Secondary | ICD-10-CM | POA: Diagnosis not present

## 2020-04-25 DIAGNOSIS — K567 Ileus, unspecified: Secondary | ICD-10-CM | POA: Diagnosis not present

## 2020-04-25 DIAGNOSIS — K30 Functional dyspepsia: Secondary | ICD-10-CM | POA: Diagnosis not present

## 2020-04-25 DIAGNOSIS — Z96651 Presence of right artificial knee joint: Secondary | ICD-10-CM | POA: Diagnosis present

## 2020-04-25 DIAGNOSIS — R110 Nausea: Secondary | ICD-10-CM | POA: Diagnosis not present

## 2020-04-25 DIAGNOSIS — R109 Unspecified abdominal pain: Secondary | ICD-10-CM

## 2020-04-25 DIAGNOSIS — D62 Acute posthemorrhagic anemia: Secondary | ICD-10-CM | POA: Diagnosis not present

## 2020-04-25 DIAGNOSIS — Z87891 Personal history of nicotine dependence: Secondary | ICD-10-CM

## 2020-04-25 DIAGNOSIS — Z9889 Other specified postprocedural states: Secondary | ICD-10-CM

## 2020-04-25 DIAGNOSIS — R140 Abdominal distension (gaseous): Secondary | ICD-10-CM | POA: Diagnosis not present

## 2020-04-25 DIAGNOSIS — J449 Chronic obstructive pulmonary disease, unspecified: Secondary | ICD-10-CM | POA: Diagnosis present

## 2020-04-25 DIAGNOSIS — G894 Chronic pain syndrome: Secondary | ICD-10-CM | POA: Diagnosis present

## 2020-04-25 DIAGNOSIS — N400 Enlarged prostate without lower urinary tract symptoms: Secondary | ICD-10-CM | POA: Diagnosis present

## 2020-04-25 DIAGNOSIS — G2581 Restless legs syndrome: Secondary | ICD-10-CM | POA: Diagnosis present

## 2020-04-25 DIAGNOSIS — R1110 Vomiting, unspecified: Secondary | ICD-10-CM

## 2020-04-25 DIAGNOSIS — Z01810 Encounter for preprocedural cardiovascular examination: Secondary | ICD-10-CM | POA: Diagnosis not present

## 2020-04-25 DIAGNOSIS — K9189 Other postprocedural complications and disorders of digestive system: Secondary | ICD-10-CM | POA: Diagnosis not present

## 2020-04-25 DIAGNOSIS — Z8261 Family history of arthritis: Secondary | ICD-10-CM

## 2020-04-25 DIAGNOSIS — H9203 Otalgia, bilateral: Secondary | ICD-10-CM | POA: Diagnosis present

## 2020-04-25 DIAGNOSIS — Z888 Allergy status to other drugs, medicaments and biological substances status: Secondary | ICD-10-CM | POA: Diagnosis not present

## 2020-04-25 DIAGNOSIS — I200 Unstable angina: Secondary | ICD-10-CM | POA: Diagnosis present

## 2020-04-25 DIAGNOSIS — K56609 Unspecified intestinal obstruction, unspecified as to partial versus complete obstruction: Secondary | ICD-10-CM

## 2020-04-25 DIAGNOSIS — Z801 Family history of malignant neoplasm of trachea, bronchus and lung: Secondary | ICD-10-CM

## 2020-04-25 DIAGNOSIS — E440 Moderate protein-calorie malnutrition: Secondary | ICD-10-CM | POA: Insufficient documentation

## 2020-04-25 DIAGNOSIS — Z79899 Other long term (current) drug therapy: Secondary | ICD-10-CM

## 2020-04-25 DIAGNOSIS — Z09 Encounter for follow-up examination after completed treatment for conditions other than malignant neoplasm: Secondary | ICD-10-CM

## 2020-04-25 DIAGNOSIS — Z86010 Personal history of colonic polyps: Secondary | ICD-10-CM | POA: Diagnosis not present

## 2020-04-25 DIAGNOSIS — Z79890 Hormone replacement therapy (postmenopausal): Secondary | ICD-10-CM

## 2020-04-25 DIAGNOSIS — Y92239 Unspecified place in hospital as the place of occurrence of the external cause: Secondary | ICD-10-CM | POA: Diagnosis not present

## 2020-04-25 DIAGNOSIS — I25110 Atherosclerotic heart disease of native coronary artery with unstable angina pectoris: Principal | ICD-10-CM | POA: Diagnosis present

## 2020-04-25 DIAGNOSIS — I6522 Occlusion and stenosis of left carotid artery: Secondary | ICD-10-CM | POA: Diagnosis present

## 2020-04-25 DIAGNOSIS — E871 Hypo-osmolality and hyponatremia: Secondary | ICD-10-CM | POA: Diagnosis not present

## 2020-04-25 DIAGNOSIS — I4892 Unspecified atrial flutter: Secondary | ICD-10-CM | POA: Diagnosis not present

## 2020-04-25 DIAGNOSIS — R112 Nausea with vomiting, unspecified: Secondary | ICD-10-CM | POA: Diagnosis not present

## 2020-04-25 DIAGNOSIS — Z95828 Presence of other vascular implants and grafts: Secondary | ICD-10-CM

## 2020-04-25 DIAGNOSIS — Z951 Presence of aortocoronary bypass graft: Secondary | ICD-10-CM | POA: Diagnosis not present

## 2020-04-25 DIAGNOSIS — K219 Gastro-esophageal reflux disease without esophagitis: Secondary | ICD-10-CM | POA: Diagnosis present

## 2020-04-25 DIAGNOSIS — T502X5A Adverse effect of carbonic-anhydrase inhibitors, benzothiadiazides and other diuretics, initial encounter: Secondary | ICD-10-CM | POA: Diagnosis not present

## 2020-04-25 DIAGNOSIS — Z419 Encounter for procedure for purposes other than remedying health state, unspecified: Secondary | ICD-10-CM

## 2020-04-25 DIAGNOSIS — Z800 Family history of malignant neoplasm of digestive organs: Secondary | ICD-10-CM

## 2020-04-25 DIAGNOSIS — E785 Hyperlipidemia, unspecified: Secondary | ICD-10-CM | POA: Diagnosis present

## 2020-04-25 DIAGNOSIS — Z01818 Encounter for other preprocedural examination: Secondary | ICD-10-CM

## 2020-04-25 DIAGNOSIS — K2970 Gastritis, unspecified, without bleeding: Secondary | ICD-10-CM | POA: Diagnosis not present

## 2020-04-25 DIAGNOSIS — I454 Nonspecific intraventricular block: Secondary | ICD-10-CM | POA: Diagnosis present

## 2020-04-25 DIAGNOSIS — Z803 Family history of malignant neoplasm of breast: Secondary | ICD-10-CM

## 2020-04-25 DIAGNOSIS — E039 Hypothyroidism, unspecified: Secondary | ICD-10-CM | POA: Diagnosis present

## 2020-04-25 DIAGNOSIS — D72829 Elevated white blood cell count, unspecified: Secondary | ICD-10-CM | POA: Diagnosis not present

## 2020-04-25 DIAGNOSIS — R933 Abnormal findings on diagnostic imaging of other parts of digestive tract: Secondary | ICD-10-CM | POA: Diagnosis not present

## 2020-04-25 DIAGNOSIS — R1084 Generalized abdominal pain: Secondary | ICD-10-CM | POA: Diagnosis not present

## 2020-04-25 DIAGNOSIS — I313 Pericardial effusion (noninflammatory): Secondary | ICD-10-CM | POA: Diagnosis not present

## 2020-04-25 DIAGNOSIS — E876 Hypokalemia: Secondary | ICD-10-CM | POA: Diagnosis not present

## 2020-04-25 HISTORY — PX: LEFT HEART CATH AND CORONARY ANGIOGRAPHY: CATH118249

## 2020-04-25 HISTORY — PX: CORONARY ULTRASOUND/IVUS: CATH118244

## 2020-04-25 LAB — ECHOCARDIOGRAM COMPLETE
Area-P 1/2: 2.96 cm2
Height: 68 in
S' Lateral: 3.8 cm
Weight: 2800 oz

## 2020-04-25 LAB — URINALYSIS, ROUTINE W REFLEX MICROSCOPIC
Bilirubin Urine: NEGATIVE
Glucose, UA: NEGATIVE mg/dL
Hgb urine dipstick: NEGATIVE
Ketones, ur: NEGATIVE mg/dL
Leukocytes,Ua: NEGATIVE
Nitrite: NEGATIVE
Protein, ur: NEGATIVE mg/dL
Specific Gravity, Urine: 1.01 (ref 1.005–1.030)
pH: 7 (ref 5.0–8.0)

## 2020-04-25 LAB — PULMONARY FUNCTION TEST
FEF 25-75 Pre: 1.44 L/sec
FEF2575-%Pred-Pre: 60 %
FEV1-%Pred-Pre: 88 %
FEV1-Pre: 2.71 L
FEV1FVC-%Pred-Pre: 89 %
FEV6-%Pred-Pre: 98 %
FEV6-Pre: 3.88 L
FEV6FVC-%Pred-Pre: 100 %
FVC-%Pred-Pre: 98 %
FVC-Pre: 4.09 L
Pre FEV1/FVC ratio: 66 %
Pre FEV6/FVC Ratio: 95 %

## 2020-04-25 LAB — POCT ACTIVATED CLOTTING TIME: Activated Clotting Time: 274 seconds

## 2020-04-25 LAB — HEPARIN LEVEL (UNFRACTIONATED): Heparin Unfractionated: 0.22 IU/mL — ABNORMAL LOW (ref 0.30–0.70)

## 2020-04-25 LAB — SURGICAL PCR SCREEN
MRSA, PCR: NEGATIVE
Staphylococcus aureus: NEGATIVE

## 2020-04-25 SURGERY — LEFT HEART CATH AND CORONARY ANGIOGRAPHY
Anesthesia: LOCAL

## 2020-04-25 MED ORDER — EZETIMIBE 10 MG PO TABS
10.0000 mg | ORAL_TABLET | Freq: Every day | ORAL | Status: DC
  Administered 2020-04-25 – 2020-05-11 (×16): 10 mg via ORAL
  Filled 2020-04-25 (×16): qty 1

## 2020-04-25 MED ORDER — LABETALOL HCL 5 MG/ML IV SOLN
INTRAVENOUS | Status: AC
  Filled 2020-04-25: qty 4

## 2020-04-25 MED ORDER — ASPIRIN EC 81 MG PO TBEC
81.0000 mg | DELAYED_RELEASE_TABLET | Freq: Every day | ORAL | Status: DC
  Administered 2020-04-26 – 2020-04-27 (×2): 81 mg via ORAL
  Filled 2020-04-25 (×2): qty 1

## 2020-04-25 MED ORDER — LIDOCAINE HCL (PF) 1 % IJ SOLN
INTRAMUSCULAR | Status: DC | PRN
  Administered 2020-04-25: 2 mL

## 2020-04-25 MED ORDER — FENTANYL CITRATE (PF) 100 MCG/2ML IJ SOLN
INTRAMUSCULAR | Status: AC
  Filled 2020-04-25: qty 2

## 2020-04-25 MED ORDER — MIDAZOLAM HCL 2 MG/2ML IJ SOLN
INTRAMUSCULAR | Status: AC
  Filled 2020-04-25: qty 2

## 2020-04-25 MED ORDER — VERAPAMIL HCL 2.5 MG/ML IV SOLN
INTRAVENOUS | Status: AC
  Filled 2020-04-25: qty 2

## 2020-04-25 MED ORDER — LEVOTHYROXINE SODIUM 75 MCG PO TABS
150.0000 ug | ORAL_TABLET | Freq: Every day | ORAL | Status: DC
  Administered 2020-04-26 – 2020-05-11 (×16): 150 ug via ORAL
  Filled 2020-04-25 (×17): qty 2

## 2020-04-25 MED ORDER — HEPARIN SODIUM (PORCINE) 1000 UNIT/ML IJ SOLN
INTRAMUSCULAR | Status: DC | PRN
  Administered 2020-04-25: 4000 [IU] via INTRAVENOUS
  Administered 2020-04-25: 5000 [IU] via INTRAVENOUS

## 2020-04-25 MED ORDER — CALCIUM CARBONATE ANTACID 500 MG PO CHEW
1.0000 | CHEWABLE_TABLET | Freq: Three times a day (TID) | ORAL | Status: DC | PRN
  Administered 2020-04-27: 200 mg via ORAL
  Filled 2020-04-25: qty 1

## 2020-04-25 MED ORDER — SODIUM CHLORIDE 0.9 % IV SOLN: 250.0000 mL | INTRAVENOUS | Status: DC | PRN

## 2020-04-25 MED ORDER — ONDANSETRON HCL 4 MG/2ML IJ SOLN: 4.0000 mg | Freq: Four times a day (QID) | INTRAMUSCULAR | Status: DC | PRN

## 2020-04-25 MED ORDER — MIDAZOLAM HCL 2 MG/2ML IJ SOLN
INTRAMUSCULAR | Status: DC | PRN
  Administered 2020-04-25: 1 mg via INTRAVENOUS

## 2020-04-25 MED ORDER — VERAPAMIL HCL 2.5 MG/ML IV SOLN
INTRAVENOUS | Status: DC | PRN
  Administered 2020-04-25: 10 mL via INTRA_ARTERIAL

## 2020-04-25 MED ORDER — HEPARIN SODIUM (PORCINE) 1000 UNIT/ML IJ SOLN
INTRAMUSCULAR | Status: AC
  Filled 2020-04-25: qty 1

## 2020-04-25 MED ORDER — SODIUM CHLORIDE 0.9% FLUSH: 3.0000 mL | INTRAVENOUS | Status: DC | PRN

## 2020-04-25 MED ORDER — ACETAMINOPHEN 325 MG PO TABS
ORAL_TABLET | ORAL | Status: AC
  Filled 2020-04-25: qty 2

## 2020-04-25 MED ORDER — ASPIRIN 81 MG PO CHEW: 81.0000 mg | CHEWABLE_TABLET | ORAL | Status: DC

## 2020-04-25 MED ORDER — CA CARBONATE-MAG HYDROXIDE 550-110 MG PO CHEW: CHEWABLE_TABLET | Freq: Three times a day (TID) | ORAL | Status: DC | PRN

## 2020-04-25 MED ORDER — LABETALOL HCL 5 MG/ML IV SOLN: 10.0000 mg | INTRAVENOUS | Status: AC | PRN

## 2020-04-25 MED ORDER — NITROGLYCERIN 1 MG/10 ML FOR IR/CATH LAB
INTRA_ARTERIAL | Status: DC | PRN
  Administered 2020-04-25: 200 ug via INTRACORONARY

## 2020-04-25 MED ORDER — ALBUTEROL SULFATE HFA 108 (90 BASE) MCG/ACT IN AERS
2.0000 | INHALATION_SPRAY | Freq: Four times a day (QID) | RESPIRATORY_TRACT | Status: DC | PRN
  Filled 2020-04-25: qty 6.7

## 2020-04-25 MED ORDER — MECLIZINE HCL 12.5 MG PO TABS
12.5000 mg | ORAL_TABLET | Freq: Three times a day (TID) | ORAL | Status: DC | PRN
  Filled 2020-04-25: qty 1

## 2020-04-25 MED ORDER — HYDRALAZINE HCL 20 MG/ML IJ SOLN: 10.0000 mg | INTRAMUSCULAR | Status: AC | PRN

## 2020-04-25 MED ORDER — SODIUM CHLORIDE 0.9% FLUSH
3.0000 mL | Freq: Two times a day (BID) | INTRAVENOUS | Status: DC
  Administered 2020-04-25: 3 mL via INTRAVENOUS

## 2020-04-25 MED ORDER — SODIUM CHLORIDE 0.9 % WEIGHT BASED INFUSION: 1.0000 mL/kg/h | INTRAVENOUS | Status: DC

## 2020-04-25 MED ORDER — SODIUM CHLORIDE 0.9 % IV SOLN: INTRAVENOUS | Status: AC

## 2020-04-25 MED ORDER — ACETAMINOPHEN 325 MG PO TABS
650.0000 mg | ORAL_TABLET | ORAL | Status: DC | PRN
  Administered 2020-04-25 – 2020-04-27 (×3): 650 mg via ORAL
  Filled 2020-04-25 (×2): qty 2

## 2020-04-25 MED ORDER — METOPROLOL TARTRATE 12.5 MG HALF TABLET
12.5000 mg | ORAL_TABLET | Freq: Two times a day (BID) | ORAL | Status: DC
  Administered 2020-04-25 – 2020-04-27 (×6): 12.5 mg via ORAL
  Filled 2020-04-25 (×6): qty 1

## 2020-04-25 MED ORDER — HEPARIN (PORCINE) 25000 UT/250ML-% IV SOLN
1200.0000 [IU]/h | INTRAVENOUS | Status: DC
  Administered 2020-04-25: 1000 [IU]/h via INTRAVENOUS
  Administered 2020-04-26 – 2020-04-27 (×2): 1200 [IU]/h via INTRAVENOUS
  Filled 2020-04-25 (×3): qty 250

## 2020-04-25 MED ORDER — NITROGLYCERIN 1 MG/10 ML FOR IR/CATH LAB
INTRA_ARTERIAL | Status: AC
  Filled 2020-04-25: qty 10

## 2020-04-25 MED ORDER — LIDOCAINE HCL (PF) 1 % IJ SOLN
INTRAMUSCULAR | Status: AC
  Filled 2020-04-25: qty 30

## 2020-04-25 MED ORDER — NITROGLYCERIN 0.4 MG SL SUBL
0.4000 mg | SUBLINGUAL_TABLET | SUBLINGUAL | Status: DC | PRN
  Administered 2020-04-26 – 2020-04-27 (×3): 0.4 mg via SUBLINGUAL
  Filled 2020-04-25 (×2): qty 1

## 2020-04-25 MED ORDER — IOHEXOL 350 MG/ML SOLN
INTRAVENOUS | Status: DC | PRN
  Administered 2020-04-25: 130 mL

## 2020-04-25 MED ORDER — HEPARIN (PORCINE) IN NACL 1000-0.9 UT/500ML-% IV SOLN
INTRAVENOUS | Status: AC
  Filled 2020-04-25: qty 1000

## 2020-04-25 MED ORDER — FENTANYL CITRATE (PF) 100 MCG/2ML IJ SOLN
INTRAMUSCULAR | Status: DC | PRN
  Administered 2020-04-25: 25 ug via INTRAVENOUS

## 2020-04-25 MED ORDER — SODIUM CHLORIDE 0.9% FLUSH
3.0000 mL | Freq: Two times a day (BID) | INTRAVENOUS | Status: DC
  Administered 2020-04-25 – 2020-04-26 (×2): 3 mL via INTRAVENOUS

## 2020-04-25 MED ORDER — HEPARIN (PORCINE) IN NACL 1000-0.9 UT/500ML-% IV SOLN
INTRAVENOUS | Status: DC | PRN
  Administered 2020-04-25 (×2): 500 mL

## 2020-04-25 MED ORDER — SODIUM CHLORIDE 0.9 % WEIGHT BASED INFUSION
3.0000 mL/kg/h | INTRAVENOUS | Status: DC
  Administered 2020-04-25: 3 mL/kg/h via INTRAVENOUS

## 2020-04-25 SURGICAL SUPPLY — 15 items
CATH IMPULSE 5F ANG/FL3.5 (CATHETERS) ×1 IMPLANT
CATH LAUNCHER 5F EBU3.5 (CATHETERS) ×1 IMPLANT
CATH OPTICROSS HD (CATHETERS) ×1 IMPLANT
DEVICE RAD COMP TR BAND LRG (VASCULAR PRODUCTS) ×1 IMPLANT
GLIDESHEATH SLEND SS 6F .021 (SHEATH) ×1 IMPLANT
GUIDEWIRE INQWIRE 1.5J.035X260 (WIRE) IMPLANT
INQWIRE 1.5J .035X260CM (WIRE) ×4
KIT HEART LEFT (KITS) ×2 IMPLANT
KIT HEMO VALVE WATCHDOG (MISCELLANEOUS) ×1 IMPLANT
PACK CARDIAC CATHETERIZATION (CUSTOM PROCEDURE TRAY) ×2 IMPLANT
SLED PULL BACK IVUS (MISCELLANEOUS) ×1 IMPLANT
SYR MEDRAD MARK 7 150ML (SYRINGE) ×2 IMPLANT
TRANSDUCER W/STOPCOCK (MISCELLANEOUS) ×2 IMPLANT
TUBING CIL FLEX 10 FLL-RA (TUBING) ×2 IMPLANT
WIRE RUNTHROUGH .014X180CM (WIRE) ×1 IMPLANT

## 2020-04-25 NOTE — Brief Op Note (Signed)
BRIEF CARDIAC CATHETERIZATION NOTE  04/25/2020  8:49 AM  PATIENT:  Guy Morris  69 y.o. male  PRE-OPERATIVE DIAGNOSIS:  Unstable angina and abnormal cardiac CTA  POST-OPERATIVE DIAGNOSIS:  Same  PROCEDURE:  Procedure(s): LEFT HEART CATH AND CORONARY ANGIOGRAPHY (N/A) Intravascular Ultrasound/IVUS (N/A)  SURGEON:  Surgeon(s) and Role:    * Uriel Horkey, MD - Primary  FINDINGS: 1. Multivessel coronary artery disease, including 60% ostial LMCA stenosis (MLA 5.96 mm^2 by IVUS), multifocal proximal and mid LAD disease of up to 50-60%, 80% ostial D2 lesion, chronic total occlusion of mid LCx with right-to-left collaterals, and 50% mid LRCA stenosis. 2. Mildly reduced left ventricular systolic function with global hypokinesis; LVEF 45-50%. 3. Normal left ventricular filling pressure.  RECOMMENDATIONS:  1. Cardiac surgery consultation for CABG.  LMCA lesion is treatable through percutaneous intervention but multifocal LAD disease involving D2 and CTO of LCx would be more challenging to treat via PCI. 2. Start heparin infusion 2 hours after TR band, as the patient was in a-fib during the procedure. 3. Start low-dose metoprolol for rate control. 4. Obtain echocardiogram. 5. Aggressive secondary prevention.  Nelva Bush, MD [facility]

## 2020-04-25 NOTE — Interval H&P Note (Signed)
History and Physical Interval Note:  04/25/2020 7:23 AM  Guy Morris  has presented today for surgery, with the diagnosis of near syncope, jaw pain, and abnormal cardiac CTA.  The various methods of treatment have been discussed with the patient and family. After consideration of risks, benefits and other options for treatment, the patient has consented to  Procedure(s): LEFT HEART CATH AND CORONARY ANGIOGRAPHY (N/A) as a surgical intervention.  The patient's history has been reviewed, patient examined, no change in status, stable for surgery.  I have reviewed the patient's chart and labs.  Questions were answered to the patient's satisfaction.    Cath Lab Visit (complete for each Cath Lab visit)  Clinical Evaluation Leading to the Procedure:   ACS: No.  Non-ACS:    Anginal Classification: CCS IV  Anti-ischemic medical therapy: No Therapy  Non-Invasive Test Results: High-risk stress test findings: cardiac mortality >3%/year (Cardiac CTA with significant 2-vessel CAD)  Prior CABG: No previous CABG  [name]

## 2020-04-25 NOTE — Progress Notes (Signed)
  Echocardiogram 2D Echocardiogram with 3D has been performed.  Darlina Sicilian M 04/25/2020, 12:28 PM

## 2020-04-25 NOTE — Progress Notes (Signed)
TCTS consulted for CABG evaluation. °

## 2020-04-25 NOTE — Plan of Care (Signed)
  Problem: Education: Goal: Knowledge of General Education information will improve Description: Including pain rating scale, medication(s)/side effects and non-pharmacologic comfort measures Outcome: Progressing   Problem: Clinical Measurements: Goal: Ability to maintain clinical measurements within normal limits will improve Outcome: Progressing Goal: Cardiovascular complication will be avoided Outcome: Progressing   Problem: Nutrition: Goal: Adequate nutrition will be maintained Outcome: Completed/Met   Problem: Elimination: Goal: Will not experience complications related to urinary retention Outcome: Completed/Met
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
## 2020-04-26 ENCOUNTER — Encounter (HOSPITAL_COMMUNITY): Payer: Self-pay | Admitting: Internal Medicine

## 2020-04-26 ENCOUNTER — Inpatient Hospital Stay (HOSPITAL_COMMUNITY): Payer: Medicare Other

## 2020-04-26 ENCOUNTER — Other Ambulatory Visit: Payer: Self-pay

## 2020-04-26 DIAGNOSIS — I200 Unstable angina: Secondary | ICD-10-CM

## 2020-04-26 DIAGNOSIS — Z01810 Encounter for preprocedural cardiovascular examination: Secondary | ICD-10-CM

## 2020-04-26 DIAGNOSIS — I495 Sick sinus syndrome: Secondary | ICD-10-CM

## 2020-04-26 DIAGNOSIS — I480 Paroxysmal atrial fibrillation: Secondary | ICD-10-CM

## 2020-04-26 LAB — CBC
HCT: 47.6 % (ref 39.0–52.0)
Hemoglobin: 15.6 g/dL (ref 13.0–17.0)
MCH: 30.7 pg (ref 26.0–34.0)
MCHC: 32.8 g/dL (ref 30.0–36.0)
MCV: 93.7 fL (ref 80.0–100.0)
Platelets: 226 10*3/uL (ref 150–400)
RBC: 5.08 MIL/uL (ref 4.22–5.81)
RDW: 12.1 % (ref 11.5–15.5)
WBC: 8.1 10*3/uL (ref 4.0–10.5)
nRBC: 0 % (ref 0.0–0.2)

## 2020-04-26 LAB — COMPREHENSIVE METABOLIC PANEL
ALT: 14 U/L (ref 0–44)
AST: 20 U/L (ref 15–41)
Albumin: 3.8 g/dL (ref 3.5–5.0)
Alkaline Phosphatase: 60 U/L (ref 38–126)
Anion gap: 8 (ref 5–15)
BUN: 8 mg/dL (ref 8–23)
CO2: 24 mmol/L (ref 22–32)
Calcium: 9.5 mg/dL (ref 8.9–10.3)
Chloride: 106 mmol/L (ref 98–111)
Creatinine, Ser: 1.01 mg/dL (ref 0.61–1.24)
GFR, Estimated: >60 mL/min (ref >60)
Glucose, Bld: 105 mg/dL — ABNORMAL HIGH (ref 70–99)
Potassium: 4.4 mmol/L (ref 3.5–5.1)
Sodium: 138 mmol/L (ref 135–145)
Total Bilirubin: 0.9 mg/dL (ref 0.3–1.2)
Total Protein: 6.5 g/dL (ref 6.5–8.1)

## 2020-04-26 LAB — BLOOD GAS, ARTERIAL
Acid-base deficit: 0.9 mmol/L (ref 0.0–2.0)
Bicarbonate: 23 mmol/L (ref 20.0–28.0)
Drawn by: 414221
FIO2: 28
O2 Saturation: 98.7 %
Patient temperature: 36.5
pCO2 arterial: 35.3 mmHg (ref 32.0–48.0)
pH, Arterial: 7.426 (ref 7.350–7.450)
pO2, Arterial: 127 mmHg — ABNORMAL HIGH (ref 83.0–108.0)

## 2020-04-26 LAB — HEPARIN LEVEL (UNFRACTIONATED)
Heparin Unfractionated: 0.55 IU/mL (ref 0.30–0.70)
Heparin Unfractionated: 0.54 IU/mL (ref 0.30–0.70)

## 2020-04-26 LAB — PROTIME-INR
INR: 1.1 (ref 0.8–1.2)
Prothrombin Time: 13.4 seconds (ref 11.4–15.2)

## 2020-04-26 LAB — HEMOGLOBIN A1C
Hgb A1c MFr Bld: 5.6 % (ref 4.8–5.6)
Mean Plasma Glucose: 114.02 mg/dL

## 2020-04-26 LAB — TSH: TSH: 6.254 u[IU]/mL — ABNORMAL HIGH (ref 0.350–4.500)

## 2020-04-26 MED ORDER — AMIODARONE HCL 200 MG PO TABS
200.0000 mg | ORAL_TABLET | Freq: Every day | ORAL | Status: DC
  Administered 2020-04-26: 200 mg via ORAL
  Filled 2020-04-26: qty 1

## 2020-04-26 NOTE — Progress Notes (Signed)
Pre-CABG testing has been completed. Preliminary results can be found in CV Proc through chart review.   04/26/20 9:21 AM Guy Morris RVT

## 2020-04-26 NOTE — Progress Notes (Addendum)
Patient had jaw and bilateral ear pain last evening. He states pain resolved after given second Nitro. He denies any jaw or ear pain now. Patient instructed to report anymore jaw/ear pain. Patient scheduled for clipping of LA appendage and CABG on Thursday with Dr. Prescott Gum.

## 2020-04-26 NOTE — Progress Notes (Signed)
ANTICOAGULATION CONSULT NOTE - Follow Up Consult  Pharmacy Consult for heparin Indication: CAD awaiting CABG  Labs: Recent Labs    04/25/20 2304  HEPARINUNFRC 0.22*    Assessment: 69yo male subtherapeutic on heparin with initial dosing post cath; no gtt issues or signs of bleeding per RN.  Goal of Therapy:  Heparin level 0.3-0.7 units/ml   Plan:  Will increase heparin gtt by 2-3 units/kg/hr to 1200 units/hr and check level in 6 hours.    Wynona Neat, PharmD, BCPS  04/26/2020,12:06 AM

## 2020-04-26 NOTE — Progress Notes (Addendum)
The patient has been seen in conjunction with Reino Bellis, NP. All aspects of care have been considered and discussed. The patient has been personally interviewed, examined, and all clinical data has been reviewed.   Multivessel CAD including significant left main, mid circumflex CTO, and diffuse disease in the LAD.  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  Plan to proceed with surgical revascularization by Dr. Prescott Gum..  The patient would probably benefit from left atrial appendage occlusion, surgical Maze/AF ablation, and at some point may require pacing. Hopefully bradycardia will not be a post op problem.  Because of recurrent atrial fibrillation last evening associated with angina, we will start loading with amiodarone orally, carefully watching for bradycardia.   Progress Note  Patient Name: Guy Morris Date of Encounter: 04/26/2020  [facility] Cardiologist: Minus Breeding, MD   Subjective   Had intermittent episodes of chest pain overnight, associated with episodes of rapid Afib  Inpatient Medications    Scheduled Meds: . aspirin EC  81 mg Oral Daily  . ezetimibe  10 mg Oral Daily  . levothyroxine  150 mcg Oral Daily  . metoprolol tartrate  12.5 mg Oral BID  . sodium chloride flush  3 mL Intravenous Q12H  . sodium chloride flush  3 mL Intravenous Q12H   Continuous Infusions: . sodium chloride    . heparin 1,200 Units/hr (04/26/20 0015)   PRN Meds: sodium chloride, acetaminophen, albuterol, calcium carbonate, meclizine, nitroGLYCERIN, ondansetron (ZOFRAN) IV, sodium chloride flush   Vital Signs    Vitals:   04/26/20 0033 04/26/20 0043 04/26/20 0347 04/26/20 0742  BP: 106/78 103/67 124/72 104/74  Pulse:   69 (!) 58  Resp:   18 18  Temp:   97.7 F (36.5 C) 97.6 F (36.4 C)  TempSrc:   Oral Oral  SpO2:      Weight:   80.8 kg   Height:        Intake/Output Summary (Last 24 hours) at 04/26/2020 0912  Last data filed at 04/26/2020 0800 Gross per 24 hour  Intake 1236.56 ml  Output 250 ml  Net 986.56 ml   Last 3 Weights 04/26/2020 04/25/2020 04/21/2020  Weight (lbs) 178 lb 2.1 oz 175 lb 185 lb 6.4 oz  Weight (kg) 80.8 kg 79.379 kg 84.097 kg      Telemetry    SB (40-60bpm) with intermittent episodes of Afib RVR (rates 150-160s) - Personally Reviewed  ECG    SB with PAC - Personally Reviewed  Physical Exam  Pleasant older male, laying in bed GEN: No acute distress.   Neck: No JVD Cardiac: RRR, no murmurs, rubs, or gallops.  Respiratory: Clear to auscultation bilaterally. GI: Soft, nontender, non-distended  MS: No edema; No deformity. Neuro:  Nonfocal  Psych: Normal affect   Labs    High Sensitivity Troponin:  No results for input(s): TROPONINIHS in the last 720 hours.    Chemistry Recent Labs  Lab 04/21/20 1142 04/26/20 0629  NA 137 138  K 5.1 4.4  CL 101 106  CO2 22 24  GLUCOSE 93 105*  BUN 10 8  CREATININE 0.68* 1.01  CALCIUM 9.8 9.5  PROT  --  6.5  ALBUMIN  --  3.8  AST  --  20  ALT  --  14  ALKPHOS  --  60  BILITOT  --  0.9  GFRNONAA 98 >60  GFRAA 113  --   ANIONGAP  --  8     Hematology  Recent Labs  Lab 04/21/20 1142 04/26/20 0629  WBC 6.1 8.1  RBC 5.04 5.08  HGB 15.9 15.6  HCT 46.4 47.6  MCV 92 93.7  MCH 31.5 30.7  MCHC 34.3 32.8  RDW 12.1 12.1  PLT 255 226    BNPNo results for input(s): BNP, PROBNP in the last 168 hours.   DDimer No results for input(s): DDIMER in the last 168 hours.   Radiology    CARDIAC CATHETERIZATION  Result Date: 04/25/2020 Conclusions: 1. Multivessel coronary artery disease, including focal, eccentric 60% ostial LMCA lesion (MLA 5.96 mm^2 by IVUS), serial 40-50% proximal through distal LAD stenoses, 80% ostial D2 stenosis, 50% proximal and 100% mid LCx stenoses with right-to-left collateral supplying the distal OM branches, and 50% mid RCA disease. 2. Mildly reduced left ventricular systolic function  (assessment limited by atrial fibrillation with rapid ventricular response).  LVEF ~ 45-50% with global hypokinesis and normal filling pressure. Recommendations: 1. Given multivessel coronary artery disease that was significant in the LAD and LCx territories on recent cardiac CTA as well as focal ostial LMCA stenosis that is significant by IVUS criteria, I recommend cardiac surgery consultation for CABG.  The LMCA lesion is treatable from a percutaneous approach.  However, the multisegmental LAD disease involving D2 and CTO of the LCx are less favorable for stenting. 2. Start heparin infusion 2 hours after removal of TR band. 3. Aggressive secondary prevention of coronary artery disease. Nelva Bush, MD [facility]   ECHOCARDIOGRAM COMPLETE  Result Date: 04/25/2020    ECHOCARDIOGRAM REPORT   Patient Name:   Guy Morris Date of Exam: 04/25/2020 Medical Rec #:  [id number]      Height:       68.0 in Accession #:    [id number]     Weight:       175.0 lb Date of Birth:  January 24, 1951      BSA:          1.931 m Patient Age:    69 years       BP:           123/85 mmHg Patient Gender: M              HR:           64 bpm. Exam Location:  Inpatient Procedure: 2D Echo and 3D Echo Indications:    CAD (coronary artery disease) [532992]  History:        Patient has prior history of Echocardiogram examinations, most                 recent 11/15/2017. COPD, Arrythmias:Atrial Fibrillation; Risk                 Factors:Dyslipidemia and Former Smoker. CAROTID STENOSIS.  Sonographer:    Darlina Sicilian RDCS Referring Phys: [place]  1. Left ventricular ejection fraction, by estimation, is 55 to 60%. The left ventricle has normal function. The left ventricle demonstrates global hypokinesis. Left ventricular diastolic parameters are consistent with Grade I diastolic dysfunction (impaired relaxation). The average left ventricular global longitudinal strain is -15.5 %.  2. Right ventricular systolic function  is normal. The right ventricular size is normal.  3. The mitral valve is normal in structure. No evidence of mitral valve regurgitation. No evidence of mitral stenosis.  4. The aortic valve is normal in structure. Aortic valve regurgitation is not visualized. No aortic stenosis is present.  5. The inferior vena cava is normal in size  with greater than 50% respiratory variability, suggesting right atrial pressure of 3 mmHg. FINDINGS  Left Ventricle: Left ventricular ejection fraction, by estimation, is 55 to 60%. The left ventricle has normal function. The left ventricle demonstrates global hypokinesis. The average left ventricular global longitudinal strain is -15.5 %. The left ventricular internal cavity size was normal in size. There is no left ventricular hypertrophy. Left ventricular diastolic parameters are consistent with Grade I diastolic dysfunction (impaired relaxation). Right Ventricle: The right ventricular size is normal. No increase in right ventricular wall thickness. Right ventricular systolic function is normal. Left Atrium: Left atrial size was normal in size. Right Atrium: Right atrial size was normal in size. Pericardium: There is no evidence of pericardial effusion. Mitral Valve: The mitral valve is normal in structure. No evidence of mitral valve regurgitation. No evidence of mitral valve stenosis. Tricuspid Valve: The tricuspid valve is normal in structure. Tricuspid valve regurgitation is not demonstrated. No evidence of tricuspid stenosis. Aortic Valve: The aortic valve is normal in structure. Aortic valve regurgitation is not visualized. No aortic stenosis is present. Pulmonic Valve: The pulmonic valve was normal in structure. Pulmonic valve regurgitation is not visualized. No evidence of pulmonic stenosis. Aorta: The aortic root is normal in size and structure. Venous: The inferior vena cava is normal in size with greater than 50% respiratory variability, suggesting right atrial pressure of  3 mmHg. IAS/Shunts: No atrial level shunt detected by color flow Doppler.  LEFT VENTRICLE PLAX 2D LVIDd:         5.00 cm  Diastology LVIDs:         3.80 cm  LV e' medial:    5.98 cm/s LV PW:         1.00 cm  LV E/e' medial:  7.7 LV IVS:        0.90 cm  LV e' lateral:   8.16 cm/s LVOT diam:     1.90 cm  LV E/e' lateral: 5.7 LV SV:         51 LV SV Index:   26       2D Longitudinal Strain LVOT Area:     2.84 cm 2D Strain GLS Avg:     -15.5 %                          3D Volume EF:                         3D EF:        49 %                         LV EDV:       106 ml                         LV ESV:       54 ml                         LV SV:        52 ml RIGHT VENTRICLE RV S prime:     15.70 cm/s LEFT ATRIUM             Index       RIGHT ATRIUM           Index LA diam:  2.80 cm 1.45 cm/m  RA Area:     15.90 cm LA Vol (A2C):   19.8 ml 10.25 ml/m RA Volume:   38.10 ml  19.73 ml/m LA Vol (A4C):   20.9 ml 10.82 ml/m LA Biplane Vol: 20.7 ml 10.72 ml/m  AORTIC VALVE LVOT Vmax:   106.00 cm/s LVOT Vmean:  67.400 cm/s LVOT VTI:    0.180 m  AORTA Ao Root diam: 3.10 cm MITRAL VALVE MV Area (PHT): 2.96 cm    SHUNTS MV Decel Time: 256 msec    Systemic VTI:  0.18 m MV E velocity: 46.30 cm/s  Systemic Diam: 1.90 cm MV A velocity: 57.00 cm/s MV E/A ratio:  0.81 Candee Furbish MD Electronically signed by Candee Furbish MD Signature Date/Time: 04/25/2020/12:47:50 PM  [id number]    Final     Cardiac Studies   Cath: 04/25/20  Conclusions: 1. Multivessel coronary artery disease, including focal, eccentric 60% ostial LMCA lesion (MLA 5.96 mm^2 by IVUS), serial 40-50% proximal through distal LAD stenoses, 80% ostial D2 stenosis, 50% proximal and 100% mid LCx stenoses with right-to-left collateral supplying the distal OM branches, and 50% mid RCA disease. 2. Mildly reduced left ventricular systolic function (assessment limited by atrial fibrillation with rapid ventricular response).  LVEF ~ 45-50% with global hypokinesis  and normal filling pressure.  Recommendations: 1. Given multivessel coronary artery disease that was significant in the LAD and LCx territories on recent cardiac CTA as well as focal ostial LMCA stenosis that is significant by IVUS criteria, I recommend cardiac surgery consultation for CABG.  The LMCA lesion is treatable from a percutaneous approach.  However, the multisegmental LAD disease involving D2 and CTO of the LCx are less favorable for stenting. 2. Start heparin infusion 2 hours after removal of TR band. 3. Aggressive secondary prevention of coronary artery disease.  Nelva Bush, MD  Diagnostic Dominance: Right  Echo: 04/25/20  IMPRESSIONS    1. Left ventricular ejection fraction, by estimation, is 55 to 60%. The  left ventricle has normal function. The left ventricle demonstrates global  hypokinesis. Left ventricular diastolic parameters are consistent with  Grade I diastolic dysfunction  (impaired relaxation). The average left ventricular global longitudinal  strain is -15.5 %.  2. Right ventricular systolic function is normal. The right ventricular  size is normal.  3. The mitral valve is normal in structure. No evidence of mitral valve  regurgitation. No evidence of mitral stenosis.  4. The aortic valve is normal in structure. Aortic valve regurgitation is  not visualized. No aortic stenosis is present.  5. The inferior vena cava is normal in size with greater than 50%  respiratory variability, suggesting right atrial pressure of 3 mmHg.   Patient Profile     69 y.o. male with a PMH of PAF, carotid stenosis, dyslipidemia who was seen in the office on 10/19 by [name] where he was having shortness of breath and intermittent jaw pain. He was sent for outpatient CCTA which was abnormal and then set up for cardiac cath.   Assessment & Plan    1. CAD: underwent cardiac cath noted above with Dr. Saunders Revel with significant 3v CAD with 60% LM disease. TCTS  consulted for CABG evaluation +/- LA Clip/MAZE. Seen by Dr. Prescott Gum with plans for CABG on 11/18. With episodes of Afib noted overnight, suspect he would benefit from LA clip/Maze with tachy/brady noted. Echo with normal EF, G1DD -- remains on IV heparin, asa, BB.   2. PAF: intermittent episodes since  02/2018. Episode developed yesterday while in the cath lab. Started on low dose metoprolol for rate control 12.5mg  BID post cath. Had intermittent episodes of rapid Afib with rates in the 150-160s range overnight with associated chest pain.  -- will start amiodarone 200mg  daily (given baseline bradycardia) -- remains on IV heparin  3. Dyslipidemia: has been statin intolerant.  -- check lipids in am -- plan was to refer to lipid clinic as an outpatient to start PCSK9s  For questions or updates, please contact [facility] Please consult [email or web address] for contact info under        Signed, Reino Bellis, NP  04/26/2020, 9:12 AM

## 2020-04-26 NOTE — Progress Notes (Signed)
ANTICOAGULATION CONSULT NOTE - Follow Up Consult  Pharmacy Consult for heparin  Indication: chest pain/ACS  Allergies  Allergen Reactions  . Atorvastatin     myalgia  . Claritin [Loratadine]     dizzy  . Rofecoxib Itching  . Rosuvastatin     myalgia  . Zocor [Simvastatin] Other (See Comments)    myalgia    Patient Measurements: Height: 5\' 8"  (172.7 cm) Weight: 80.8 kg (178 lb 2.1 oz) IBW/kg (Calculated) : 68.4 Heparin Dosing Weight: 80kg  Vital Signs: Temp: 97.6 F (36.4 C) (11/16 0742) Temp Source: Oral (11/16 0742) BP: 104/74 (11/16 0742) Pulse Rate: 58 (11/16 0742)  Labs: Recent Labs    04/25/20 2304 04/26/20 0629  HGB  --  15.6  HCT  --  47.6  PLT  --  226  LABPROT  --  13.4  INR  --  1.1  HEPARINUNFRC 0.22* 0.55  CREATININE  --  1.01    Estimated Creatinine Clearance: 67.7 mL/min (by C-G formula based on SCr of 1.01 mg/dL).  Assessment: 69 y.o. male, here for diagnostic cath to assess ongoing chest pains. Left heart cath reveals multivessel CAD. Surgery has been consulted for CABG. Orders received to start IV heparin until surgery.   Heparin level at goal this morning (0.55). CBC stable.  No bleeding noted.   Goal of Therapy:  Heparin level 0.3-0.7 units/ml Monitor platelets by anticoagulation protocol: Yes   Plan:  -Continue heparin 1200 units/hr.  -Daily CBC. -Follow-up plans for surgery   Geraldo Haris 04/26/2020,8:02 AM

## 2020-04-26 NOTE — Progress Notes (Signed)
Upon entering room to change IV Heparin dose, Pt states he has been having chest pressure for about 30 min. Pt states not a pain to rate "just pressure". BP at that time 114/76 HR 62. O2 applied per [place] and 1 SL Ntg given per PRN order. Pt then stated he was having pain into his jaw- BP 113/69 HR 64. EKG obtained with out acute changes. Pt then given a 2nd SL Ntg. BP 110/76. Pt verbalized complete relief of  Pain/pressure. BP 106/78. Pt states he feels like his heart is beating hard and he can hear it in his ears. Pt requested to have bed raised up and that feeling resolved. Pt instructed to call with ANY pain/pressure and not to wait for staff to enter room -pt verbalizes understanding. Will continue to monitor. Jessie Foot, RN

## 2020-04-26 NOTE — Progress Notes (Signed)
[id number] Discussed with pt the importance of IS and walking after surgery. Gave IS and pt demonstrated 2500 ml correctly. Gave staying in the tube handout and discussed sternal precautions. 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 Had pt rock and I assisted him to standing position. Did not walk with pt as he has LM disease and he has been having some chest pressure. Encouraged him to move knees while sitting on side of bed to keep from getting stiff. Pt stated he could tell knees stiffer since not walking. Wife will be available after discharge to assist with care. Gave [facility] booklet, care guide and wrote down how to view pre op video. Pt stated will watch with wife later. Will f/up after surgery. [name] RN BSN 04/26/2020 10:24 AM

## 2020-04-27 DIAGNOSIS — I25110 Atherosclerotic heart disease of native coronary artery with unstable angina pectoris: Secondary | ICD-10-CM | POA: Diagnosis not present

## 2020-04-27 DIAGNOSIS — I480 Paroxysmal atrial fibrillation: Secondary | ICD-10-CM | POA: Diagnosis not present

## 2020-04-27 DIAGNOSIS — I200 Unstable angina: Secondary | ICD-10-CM | POA: Diagnosis not present

## 2020-04-27 LAB — LIPID PANEL
Cholesterol: 218 mg/dL — ABNORMAL HIGH (ref 0–200)
HDL: 51 mg/dL (ref >40)
LDL Cholesterol: 140 mg/dL — ABNORMAL HIGH (ref 0–99)
Total CHOL/HDL Ratio: 4.3 RATIO
Triglycerides: 133 mg/dL (ref <150)
VLDL: 27 mg/dL (ref 0–40)

## 2020-04-27 LAB — HEPARIN LEVEL (UNFRACTIONATED): Heparin Unfractionated: 0.69 IU/mL (ref 0.30–0.70)

## 2020-04-27 LAB — BASIC METABOLIC PANEL
Anion gap: 8 (ref 5–15)
BUN: 14 mg/dL (ref 8–23)
CO2: 25 mmol/L (ref 22–32)
Calcium: 9.4 mg/dL (ref 8.9–10.3)
Chloride: 105 mmol/L (ref 98–111)
Creatinine, Ser: 0.96 mg/dL (ref 0.61–1.24)
GFR, Estimated: >60 mL/min (ref >60)
Glucose, Bld: 102 mg/dL — ABNORMAL HIGH (ref 70–99)
Potassium: 4.7 mmol/L (ref 3.5–5.1)
Sodium: 138 mmol/L (ref 135–145)

## 2020-04-27 LAB — CBC
HCT: 45.6 % (ref 39.0–52.0)
Hemoglobin: 14.9 g/dL (ref 13.0–17.0)
MCH: 30.7 pg (ref 26.0–34.0)
MCHC: 32.7 g/dL (ref 30.0–36.0)
MCV: 94 fL (ref 80.0–100.0)
Platelets: 230 10*3/uL (ref 150–400)
RBC: 4.85 MIL/uL (ref 4.22–5.81)
RDW: 12.1 % (ref 11.5–15.5)
WBC: 8.6 10*3/uL (ref 4.0–10.5)
nRBC: 0 % (ref 0.0–0.2)

## 2020-04-27 LAB — PREPARE RBC (CROSSMATCH)

## 2020-04-27 MED ORDER — VANCOMYCIN HCL 1250 MG/250ML IV SOLN
1250.0000 mg | INTRAVENOUS | Status: AC
  Administered 2020-04-28: 1250 mg via INTRAVENOUS
  Filled 2020-04-27: qty 250

## 2020-04-27 MED ORDER — DEXMEDETOMIDINE HCL IN NACL 400 MCG/100ML IV SOLN
0.1000 ug/kg/h | INTRAVENOUS | Status: AC
  Administered 2020-04-28: .5 ug/kg/h via INTRAVENOUS
  Filled 2020-04-27: qty 100

## 2020-04-27 MED ORDER — TEMAZEPAM 15 MG PO CAPS: 15.0000 mg | ORAL_CAPSULE | Freq: Once | ORAL | Status: DC | PRN

## 2020-04-27 MED ORDER — PLASMA-LYTE 148 IV SOLN
INTRAVENOUS | Status: DC
  Filled 2020-04-27: qty 2.5

## 2020-04-27 MED ORDER — CHLORHEXIDINE GLUCONATE 0.12 % MT SOLN
15.0000 mL | Freq: Once | OROMUCOSAL | Status: AC
  Administered 2020-04-28: 15 mL via OROMUCOSAL
  Filled 2020-04-27: qty 15

## 2020-04-27 MED ORDER — AMIODARONE HCL 200 MG PO TABS
200.0000 mg | ORAL_TABLET | Freq: Two times a day (BID) | ORAL | Status: DC
  Administered 2020-04-27 – 2020-05-02 (×11): 200 mg via ORAL
  Filled 2020-04-27 (×11): qty 1

## 2020-04-27 MED ORDER — ALPRAZOLAM 0.25 MG PO TABS: 0.2500 mg | ORAL_TABLET | ORAL | Status: DC | PRN

## 2020-04-27 MED ORDER — MAGNESIUM SULFATE 50 % IJ SOLN
40.0000 meq | INTRAMUSCULAR | Status: DC
  Filled 2020-04-27: qty 9.85

## 2020-04-27 MED ORDER — CHLORHEXIDINE GLUCONATE 4 % EX LIQD
60.0000 mL | Freq: Once | CUTANEOUS | Status: AC
  Administered 2020-04-27: 4 via TOPICAL
  Filled 2020-04-27: qty 60

## 2020-04-27 MED ORDER — SODIUM CHLORIDE 0.9 % IV SOLN
INTRAVENOUS | Status: DC
  Filled 2020-04-27: qty 30

## 2020-04-27 MED ORDER — POTASSIUM CHLORIDE 2 MEQ/ML IV SOLN
80.0000 meq | INTRAVENOUS | Status: DC
  Filled 2020-04-27: qty 40

## 2020-04-27 MED ORDER — SORBITOL 70 % SOLN
45.0000 mL | Freq: Once | Status: AC
  Administered 2020-04-27: 45 mL via ORAL
  Filled 2020-04-27: qty 60

## 2020-04-27 MED ORDER — CHLORHEXIDINE GLUCONATE 4 % EX LIQD
60.0000 mL | Freq: Once | CUTANEOUS | Status: AC
  Administered 2020-04-28: 4 via TOPICAL
  Filled 2020-04-27: qty 60

## 2020-04-27 MED ORDER — TRANEXAMIC ACID (OHS) BOLUS VIA INFUSION
15.0000 mg/kg | INTRAVENOUS | Status: AC
  Administered 2020-04-28: 1203 mg via INTRAVENOUS
  Filled 2020-04-27: qty 1203

## 2020-04-27 MED ORDER — PHENYLEPHRINE HCL-NACL 20-0.9 MG/250ML-% IV SOLN
30.0000 ug/min | INTRAVENOUS | Status: AC
  Administered 2020-04-28: 25 ug/min via INTRAVENOUS
  Filled 2020-04-27: qty 250

## 2020-04-27 MED ORDER — INSULIN REGULAR(HUMAN) IN NACL 100-0.9 UT/100ML-% IV SOLN
INTRAVENOUS | Status: AC
  Administered 2020-04-28: .9 [IU]/h via INTRAVENOUS
  Filled 2020-04-27: qty 100

## 2020-04-27 MED ORDER — METOPROLOL TARTRATE 12.5 MG HALF TABLET
12.5000 mg | ORAL_TABLET | Freq: Once | ORAL | Status: AC
  Administered 2020-04-28: 12.5 mg via ORAL
  Filled 2020-04-27: qty 1

## 2020-04-27 MED ORDER — TRANEXAMIC ACID (OHS) PUMP PRIME SOLUTION
2.0000 mg/kg | INTRAVENOUS | Status: DC
  Filled 2020-04-27: qty 1.6

## 2020-04-27 MED ORDER — NOREPINEPHRINE 4 MG/250ML-% IV SOLN
0.0000 ug/min | INTRAVENOUS | Status: DC
  Filled 2020-04-27: qty 250

## 2020-04-27 MED ORDER — SODIUM CHLORIDE 0.9 % IV SOLN
1.5000 g | INTRAVENOUS | Status: AC
  Administered 2020-04-28: 1.5 g via INTRAVENOUS
  Filled 2020-04-27: qty 1.5

## 2020-04-27 MED ORDER — EPINEPHRINE HCL 5 MG/250ML IV SOLN IN NS
0.0000 ug/min | INTRAVENOUS | Status: DC
  Filled 2020-04-27: qty 250

## 2020-04-27 MED ORDER — DOCUSATE SODIUM 100 MG PO CAPS
200.0000 mg | ORAL_CAPSULE | Freq: Every day | ORAL | Status: DC
  Administered 2020-04-27: 200 mg via ORAL
  Filled 2020-04-27: qty 2

## 2020-04-27 MED ORDER — BISACODYL 5 MG PO TBEC: 5.0000 mg | DELAYED_RELEASE_TABLET | Freq: Once | ORAL | Status: DC

## 2020-04-27 MED ORDER — MILRINONE LACTATE IN DEXTROSE 20-5 MG/100ML-% IV SOLN
0.3000 ug/kg/min | INTRAVENOUS | Status: AC
  Administered 2020-04-28: .25 ug/kg/min via INTRAVENOUS
  Filled 2020-04-27: qty 100

## 2020-04-27 MED ORDER — SODIUM CHLORIDE 0.9 % IV SOLN
750.0000 mg | INTRAVENOUS | Status: AC
  Administered 2020-04-28: 750 mg via INTRAVENOUS
  Filled 2020-04-27: qty 750

## 2020-04-27 MED ORDER — DIAZEPAM 5 MG PO TABS
5.0000 mg | ORAL_TABLET | Freq: Once | ORAL | Status: AC
  Administered 2020-04-28: 5 mg via ORAL
  Filled 2020-04-27: qty 1

## 2020-04-27 MED ORDER — TRANEXAMIC ACID 1000 MG/10ML IV SOLN
1.5000 mg/kg/h | INTRAVENOUS | Status: AC
  Administered 2020-04-28: 1.5 mg/kg/h via INTRAVENOUS
  Filled 2020-04-27: qty 25

## 2020-04-27 MED ORDER — NITROGLYCERIN IN D5W 200-5 MCG/ML-% IV SOLN
2.0000 ug/min | INTRAVENOUS | Status: DC
  Filled 2020-04-27: qty 250

## 2020-04-27 NOTE — Progress Notes (Signed)
2 Days Post-Op Procedure(s) (LRB): LEFT HEART CATH AND CORONARY ANGIOGRAPHY (N/A) Intravascular Ultrasound/IVUS (N/A) Subjective: Three-vessel coronary disease with PAF Stable on IV heparin except when he develops rapid atrial fibrillation then the patient complains he has chest pain, dizziness. Preoperative evaluation completed.  Patient scheduled for multivessel CABG with possible left atrial Maze procedure in a.m.  I have discussed the procedure in detail with the patient including the benefits and risks and he agrees to proceed. Objective: Vital signs in last 24 hours: Temp:  [97.4 F (36.3 C)-98.6 F (37 C)] 98.6 F (37 C) (11/17 1520) Pulse Rate:  [55-108] 55 (11/17 1520) Cardiac Rhythm: Normal sinus rhythm;Bundle branch block (11/17 0704) Resp:  [16-20] 19 (11/17 1520) BP: (110-132)/(65-99) 116/70 (11/17 1520) SpO2:  [93 %-100 %] 100 % (11/17 1130) Weight:  [80.2 kg] 80.2 kg (11/17 0541)  Hemodynamic parameters for last 24 hours:  Stable  Intake/Output from previous day: 11/16 0701 - 11/17 0700 In: 1544 [P.O.:1280; I.V.:264] Out: 1400 [Urine:1400] Intake/Output this shift: Total I/O In: 478 [P.O.:478] Out: 500 [Urine:500]       Exam    General- alert and comfortable    Neck- no JVD, no cervical adenopathy palpable, no carotid bruit   Lungs- clear without rales, wheezes   Cor- regular rate and rhythm, no murmur , gallop   Abdomen- soft, non-tender   Extremities - warm, non-tender, minimal edema   Neuro- oriented, appropriate, no focal weakness   Lab Results: Recent Labs    04/26/20 0629 04/27/20 0339  WBC 8.1 8.6  HGB 15.6 14.9  HCT 47.6 45.6  PLT 226 230   BMET:  Recent Labs    04/26/20 0629 04/27/20 0339  NA 138 138  K 4.4 4.7  CL 106 105  CO2 24 25  GLUCOSE 105* 102*  BUN 8 14  CREATININE 1.01 0.96  CALCIUM 9.5 9.4    PT/INR:  Recent Labs    04/26/20 0629  LABPROT 13.4  INR 1.1   ABG    Component Value Date/Time   PHART 7.426  04/26/2020 0543   HCO3 23.0 04/26/2020 0543   ACIDBASEDEF 0.9 04/26/2020 0543   O2SAT 98.7 04/26/2020 0543   CBG (last 3)  No results for input(s): GLUCAP in the last 72 hours.  Assessment/Plan: S/P Procedure(s) (LRB): LEFT HEART CATH AND CORONARY ANGIOGRAPHY (N/A) Intravascular Ultrasound/IVUS (N/A) Plan OR in a.m. as discussed above. Continue IV heparin until called to the OR. Continue preoperative oral amiodarone.  LOS: 2 days    Tharon Aquas Trigt III 04/27/2020

## 2020-04-27 NOTE — Progress Notes (Signed)
ANTICOAGULATION CONSULT NOTE - Follow Up Consult  Pharmacy Consult for heparin  Indication: chest pain/ACS  Allergies  Allergen Reactions  . Atorvastatin     myalgia  . Claritin [Loratadine]     dizzy  . Rofecoxib Itching  . Rosuvastatin     myalgia  . Zocor [Simvastatin] Other (See Comments)    myalgia    Patient Measurements: Height: 5\' 8"  (172.7 cm) Weight: 80.2 kg (176 lb 14.4 oz) IBW/kg (Calculated) : 68.4 Heparin Dosing Weight: 80kg  Vital Signs: Temp: 97.4 F (36.3 C) (11/17 0900) Temp Source: Oral (11/17 0900) BP: 129/65 (11/17 0900) Pulse Rate: 60 (11/17 0900)  Labs: Recent Labs    04/26/20 0629 04/26/20 1233 04/27/20 0339  HGB 15.6  --  14.9  HCT 47.6  --  45.6  PLT 226  --  230  LABPROT 13.4  --   --   INR 1.1  --   --   HEPARINUNFRC 0.55 0.54 0.69  CREATININE 1.01  --  0.96    Estimated Creatinine Clearance: 71.3 mL/min (by C-G formula based on SCr of 0.96 mg/dL).  Assessment: 69 y.o. male, here for diagnostic cath to assess ongoing chest pains. Left heart cath reveals multivessel CAD. Surgery has been consulted for CABG 11/18.  Started IV heparin post cath until surgery.   Heparin level at goal this morning (0.69) on heparin drip 1200 uts/hr. CBC stable.  No bleeding noted.   Goal of Therapy:  Heparin level 0.3-0.7 units/ml Monitor platelets by anticoagulation protocol: Yes   Plan:  -Continue heparin 1200 units/hr.  -Daily CBC. -Follow-up after surgery tomorrow   Bonnita Nasuti Pharm.D. CPP, BCPS Clinical Pharmacist [phone] 04/27/2020 9:48 AM

## 2020-04-27 NOTE — Anesthesia Preprocedure Evaluation (Addendum)
Anesthesia Evaluation  Patient identified by MRN, date of birth, ID band Patient awake    Reviewed: Allergy & Precautions, NPO status , Patient's Chart, lab work & pertinent test results  History of Anesthesia Complications Negative for: history of anesthetic complications  Airway Mallampati: II  TM Distance: >3 FB Neck ROM: Full    Dental  (+) Dental Advisory Given   Pulmonary COPD, former smoker,    Pulmonary exam normal        Cardiovascular + CAD  Normal cardiovascular exam   '21 Carotid US - 40-59% left ICAS, 1-39% right ICAS  '21 TTE - EF 55 to 60%.Global hypokinesis. Left ventricular diastolic parameters are consistent with  Grade I diastolic dysfunction (impaired relaxation). No valvulopathy.  '21 Cath - 1. Multivessel coronary artery disease, including focal, eccentric 60% ostial LMCA lesion (MLA 5.96 mm^2 by IVUS), serial 40-50% proximal through distal LAD stenoses, 80% ostial D2 stenosis, 50% proximal and 100% mid LCx stenoses with right-to-left collateral supplying the distal OM branches, and 50% mid RCA disease. 2. Mildly reduced left ventricular systolic function (assessment limited by atrial fibrillation with rapid ventricular response).  LVEF ~ 45-50% with global hypokinesis and normal filling pressure.    Neuro/Psych  Headaches,  RLS  negative psych ROS   GI/Hepatic Neg liver ROS, GERD  Controlled and Medicated,  Endo/Other  Hypothyroidism   Renal/GU negative Renal ROS     Musculoskeletal  (+) Arthritis ,   Abdominal   Peds  Hematology negative hematology ROS (+)   Anesthesia Other Findings Covid test negative   Reproductive/Obstetrics                            Anesthesia Physical Anesthesia Plan  ASA: IV  Anesthesia Plan: General   Post-op Pain Management:    Induction: Intravenous  PONV Risk Score and Plan: 2 and Treatment may vary due to age or medical  condition  Airway Management Planned: Oral ETT  Additional Equipment: Arterial line, CVP, PA Cath, TEE and Ultrasound Guidance Line Placement  Intra-op Plan:   Post-operative Plan: Post-operative intubation/ventilation  Informed Consent: I have reviewed the patients History and Physical, chart, labs and discussed the procedure including the risks, benefits and alternatives for the proposed anesthesia with the patient or authorized representative who has indicated his/her understanding and acceptance.     Dental advisory given  Plan Discussed with: CRNA and Anesthesiologist  Anesthesia Plan Comments:        Anesthesia Quick Evaluation

## 2020-04-27 NOTE — Progress Notes (Signed)
Pt c/o CP. See MAR nitro SL administered x1 with positive results. EKG completed at bedside Afib.

## 2020-04-27 NOTE — Progress Notes (Addendum)
Patient was sleeping. Nurse came into room because heart rate into the 140's ( a fib) and patient then felt pain across both sides of his chest, that went up into his neck and jaw. He was given Nitro with relief. EKG showed atrial fibrillation with RVR. OR for CABG, LA clip in am. Patient requesting stool softener this am.

## 2020-04-27 NOTE — Progress Notes (Addendum)
The patient has been seen in conjunction with Harlan Stains, NP. All aspects of care have been considered and discussed. The patient has been personally interviewed, examined, and all clinical data has been reviewed.   Currently feels well.  Has had intermittent atrial fibrillation over the past 24 hours associated with episodes of chest pain.  Oral amiodarone started yesterday.  Will likely need amiodarone postop.  Increase to 200 mg twice daily.  Plan is for left atrial appendage occlusion and possibly Maze.   Progress Note  Patient Name: Guy Morris Date of Encounter: 04/27/2020  [facility] Cardiologist: Minus Breeding, MD   Subjective   Developed recurrent afib RVR with associated chest pain. Converted spontaneously. No chest pain now.   Inpatient Medications    Scheduled Meds: . amiodarone  200 mg Oral Daily  . aspirin EC  81 mg Oral Daily  . ezetimibe  10 mg Oral Daily  . levothyroxine  150 mcg Oral Daily  . metoprolol tartrate  12.5 mg Oral BID  . sodium chloride flush  3 mL Intravenous Q12H  . sodium chloride flush  3 mL Intravenous Q12H   Continuous Infusions: . sodium chloride    . heparin 1,200 Units/hr (04/27/20 0739)   PRN Meds: sodium chloride, acetaminophen, albuterol, calcium carbonate, meclizine, nitroGLYCERIN, ondansetron (ZOFRAN) IV, sodium chloride flush   Vital Signs    Vitals:   04/26/20 1710 04/26/20 1956 04/27/20 0014 04/27/20 0541  BP: 110/75 111/70 132/69 (!) 123/99  Pulse: 64 64 65 (!) 108  Resp: 19 18 16 19   Temp: 97.8 F (36.6 C) 97.6 F (36.4 C) 97.6 F (36.4 C) (!) 97.4 F (36.3 C)  TempSrc: Oral Oral Oral Oral  SpO2:  95% 97% 96%  Weight:    80.2 kg  Height:        Intake/Output Summary (Last 24 hours) at 04/27/2020 0752 Last data filed at 04/27/2020 0545 Gross per 24 hour  Intake 1543.95 ml  Output 1400 ml  Net 143.95 ml   Last 3 Weights 04/27/2020 04/26/2020 04/25/2020  Weight (lbs) 176 lb 14.4 oz 178 lb  2.1 oz 175 lb  Weight (kg) 80.241 kg 80.8 kg 79.379 kg      Telemetry    Episode of Afib RVR, conversion to SR - Personally Reviewed  ECG    Afib RVR 103bpm - Personally Reviewed  Physical Exam  Pleasant older male GEN: No acute distress.   Neck: No JVD Cardiac: RRR, no murmurs, rubs, or gallops.  Respiratory: Clear to auscultation bilaterally. GI: Soft, nontender, non-distended  MS: No edema; No deformity. Neuro:  Nonfocal  Psych: Normal affect   Labs    High Sensitivity Troponin:  No results for input(s): TROPONINIHS in the last 720 hours.    Chemistry Recent Labs  Lab 04/21/20 1142 04/26/20 0629 04/27/20 0339  NA 137 138 138  K 5.1 4.4 4.7  CL 101 106 105  CO2 22 24 25   GLUCOSE 93 105* 102*  BUN 10 8 14   CREATININE 0.68* 1.01 0.96  CALCIUM 9.8 9.5 9.4  PROT  --  6.5  --   ALBUMIN  --  3.8  --   AST  --  20  --   ALT  --  14  --   ALKPHOS  --  60  --   BILITOT  --  0.9  --   GFRNONAA 98 >60 >60  GFRAA 113  --   --   ANIONGAP  --  8  8     Hematology Recent Labs  Lab 04/21/20 1142 04/26/20 0629 04/27/20 0339  WBC 6.1 8.1 8.6  RBC 5.04 5.08 4.85  HGB 15.9 15.6 14.9  HCT 46.4 47.6 45.6  MCV 92 93.7 94.0  MCH 31.5 30.7 30.7  MCHC 34.3 32.8 32.7  RDW 12.1 12.1 12.1  PLT 255 226 230    BNPNo results for input(s): BNP, PROBNP in the last 168 hours.   DDimer No results for input(s): DDIMER in the last 168 hours.   Radiology    DG Chest 2 View  Result Date: 04/26/2020 CLINICAL DATA:  Preoperative study for heart surgery. EXAM: CHEST - 2 VIEW COMPARISON:  02/18/2020. FINDINGS: Mediastinum and hilar structures normal. Heart size normal. No focal infiltrate. Mild left base subsegmental atelectasis and or scarring again noted. No prominent pleural effusion. No pneumothorax. Thoracic spine scoliosis and degenerative change. IMPRESSION: No acute cardiopulmonary disease. Electronically Signed   By: Marcello Moores  Register   On: 04/26/2020 09:57    ECHOCARDIOGRAM COMPLETE  Result Date: 04/25/2020    ECHOCARDIOGRAM REPORT   Patient Name:   Guy Morris Date of Exam: 04/25/2020 Medical Rec #:  [id number]      Height:       68.0 in Accession #:    [id number]     Weight:       175.0 lb Date of Birth:  04-23-1951      BSA:          1.931 m Patient Age:    69 years       BP:           123/85 mmHg Patient Gender: M              HR:           64 bpm. Exam Location:  Inpatient Procedure: 2D Echo and 3D Echo Indications:    CAD (coronary artery disease) [409735]  History:        Patient has prior history of Echocardiogram examinations, most                 recent 11/15/2017. COPD, Arrythmias:Atrial Fibrillation; Risk                 Factors:Dyslipidemia and Former Smoker. CAROTID STENOSIS.  Sonographer:    Darlina Sicilian RDCS Referring Phys: [place]  1. Left ventricular ejection fraction, by estimation, is 55 to 60%. The left ventricle has normal function. The left ventricle demonstrates global hypokinesis. Left ventricular diastolic parameters are consistent with Grade I diastolic dysfunction (impaired relaxation). The average left ventricular global longitudinal strain is -15.5 %.  2. Right ventricular systolic function is normal. The right ventricular size is normal.  3. The mitral valve is normal in structure. No evidence of mitral valve regurgitation. No evidence of mitral stenosis.  4. The aortic valve is normal in structure. Aortic valve regurgitation is not visualized. No aortic stenosis is present.  5. The inferior vena cava is normal in size with greater than 50% respiratory variability, suggesting right atrial pressure of 3 mmHg. FINDINGS  Left Ventricle: Left ventricular ejection fraction, by estimation, is 55 to 60%. The left ventricle has normal function. The left ventricle demonstrates global hypokinesis. The average left ventricular global longitudinal strain is -15.5 %. The left ventricular internal cavity size was normal  in size. There is no left ventricular hypertrophy. Left ventricular diastolic parameters are consistent with Grade I diastolic dysfunction (impaired relaxation). Right Ventricle: The  right ventricular size is normal. No increase in right ventricular wall thickness. Right ventricular systolic function is normal. Left Atrium: Left atrial size was normal in size. Right Atrium: Right atrial size was normal in size. Pericardium: There is no evidence of pericardial effusion. Mitral Valve: The mitral valve is normal in structure. No evidence of mitral valve regurgitation. No evidence of mitral valve stenosis. Tricuspid Valve: The tricuspid valve is normal in structure. Tricuspid valve regurgitation is not demonstrated. No evidence of tricuspid stenosis. Aortic Valve: The aortic valve is normal in structure. Aortic valve regurgitation is not visualized. No aortic stenosis is present. Pulmonic Valve: The pulmonic valve was normal in structure. Pulmonic valve regurgitation is not visualized. No evidence of pulmonic stenosis. Aorta: The aortic root is normal in size and structure. Venous: The inferior vena cava is normal in size with greater than 50% respiratory variability, suggesting right atrial pressure of 3 mmHg. IAS/Shunts: No atrial level shunt detected by color flow Doppler.  LEFT VENTRICLE PLAX 2D LVIDd:         5.00 cm  Diastology LVIDs:         3.80 cm  LV e' medial:    5.98 cm/s LV PW:         1.00 cm  LV E/e' medial:  7.7 LV IVS:        0.90 cm  LV e' lateral:   8.16 cm/s LVOT diam:     1.90 cm  LV E/e' lateral: 5.7 LV SV:         51 LV SV Index:   26       2D Longitudinal Strain LVOT Area:     2.84 cm 2D Strain GLS Avg:     -15.5 %                          3D Volume EF:                         3D EF:        49 %                         LV EDV:       106 ml                         LV ESV:       54 ml                         LV SV:        52 ml RIGHT VENTRICLE RV S prime:     15.70 cm/s LEFT ATRIUM              Index       RIGHT ATRIUM           Index LA diam:        2.80 cm 1.45 cm/m  RA Area:     15.90 cm LA Vol (A2C):   19.8 ml 10.25 ml/m RA Volume:   38.10 ml  19.73 ml/m LA Vol (A4C):   20.9 ml 10.82 ml/m LA Biplane Vol: 20.7 ml 10.72 ml/m  AORTIC VALVE LVOT Vmax:   106.00 cm/s LVOT Vmean:  67.400 cm/s LVOT VTI:    0.180 m  AORTA Ao Root diam: 3.10 cm MITRAL VALVE MV Area (PHT):  2.96 cm    SHUNTS MV Decel Time: 256 msec    Systemic VTI:  0.18 m MV E velocity: 46.30 cm/s  Systemic Diam: 1.90 cm MV A velocity: 57.00 cm/s MV E/A ratio:  0.81 Candee Furbish MD Electronically signed by Candee Furbish MD Signature Date/Time: 04/25/2020/12:47:50 PM  [id number]    Final    VAS US DOPPLER PRE CABG  Result Date: 04/26/2020 PREOPERATIVE VASCULAR EVALUATION  Indications:      Pre-CABG. Risk Factors:     Hyperlipidemia. Comparison Study: Carotid artery duplex performed on 04/13/2020 noted right sided                   1-39% ICA stenosis, and 40-59% left ICA stenosis. Performing Technologist: Carlos Levering RVT  Examination Guidelines: A complete evaluation includes B-mode imaging, spectral Doppler, color Doppler, and power Doppler as needed of all accessible portions of each vessel. Bilateral testing is considered an integral part of a complete examination. Limited examinations for reoccurring indications may be performed as noted.  Right Carotid Findings: Per 04/13/2020 duplex 1-39% ICA stenosis. Left Carotid Findings: Per 04/13/2020 duplex 40-59% ICA stenosis. ABI Findings: +--------+------------------+-----+---------+--------+ Right   Rt Pressure (mmHg)IndexWaveform Comment  +--------+------------------+-----+---------+--------+ [id number]                    triphasic         +--------+------------------+-----+---------+--------+ PTA     124               0.98 triphasic         +--------+------------------+-----+---------+--------+ DP      117               0.93 triphasic          +--------+------------------+-----+---------+--------+ +--------+------------------+-----+----------+-------+ Left    Lt Pressure (mmHg)IndexWaveform  Comment +--------+------------------+-----+----------+-------+ [id number]                    triphasic         +--------+------------------+-----+----------+-------+ PTA     75                0.60 monophasic        +--------+------------------+-----+----------+-------+ DP      70                0.56 monophasic        +--------+------------------+-----+----------+-------+ +-------+---------------+----------------+ ABI/TBIToday's ABI/TBIPrevious ABI/TBI +-------+---------------+----------------+ Right  0.98                            +-------+---------------+----------------+ Left   0.6                             +-------+---------------+----------------+  Right Doppler Findings: +--------+--------+-----+---------+--------+ Site    PressureIndexDoppler  Comments +--------+--------+-----+---------+--------+ [id number]          triphasic         +--------+--------+-----+---------+--------+ Radial               triphasic         +--------+--------+-----+---------+--------+ Ulnar                triphasic         +--------+--------+-----+---------+--------+  Left Doppler Findings: +--------+--------+-----+---------+--------+ Site    PressureIndexDoppler  Comments +--------+--------+-----+---------+--------+ [id number]          triphasic         +--------+--------+-----+---------+--------+ Radial  triphasic         +--------+--------+-----+---------+--------+ Ulnar                triphasic         +--------+--------+-----+---------+--------+  Summary: Right Carotid: Velocities in the right ICA are consistent with a 1-39% stenosis. Left Carotid: Velocities in the left ICA are consistent with a 40-59% stenosis. Vertebrals: Right vertebral artery demonstrates antegrade flow. Left  vertebral             artery demonstrates no discernable flow. Right ABI: Resting right ankle-brachial index is within normal range. No evidence of significant right lower extremity arterial disease. Left ABI: Resting left ankle-brachial index indicates moderate left lower extremity arterial disease. Right Upper Extremity: Doppler waveform obliterate with right radial compression. Doppler waveforms remain within normal limits with right ulnar compression. Left Upper Extremity: Doppler waveform obliterate with left radial compression. Doppler waveform obliterate with left ulnar compression.  Electronically signed by Curt Jews MD on 04/26/2020 at 4:40:03 PM.    Final     Cardiac Studies   Cath: 04/25/20  Conclusions: 1. Multivessel coronary artery disease, including focal, eccentric 60% ostial LMCA lesion (MLA 5.96 mm^2 by IVUS), serial 40-50% proximal through distal LAD stenoses, 80% ostial D2 stenosis, 50% proximal and 100% mid LCx stenoses with right-to-left collateral supplying the distal OM branches, and 50% mid RCA disease. 2. Mildly reduced left ventricular systolic function (assessment limited by atrial fibrillation with rapid ventricular response). LVEF ~ 45-50% with global hypokinesis and normal filling pressure.  Recommendations: 1. Given multivessel coronary artery disease that was significant in the LAD and LCx territories on recent cardiac CTA as well as focal ostial LMCA stenosis that is significant by IVUS criteria, I recommend cardiac surgery consultation for CABG. The LMCA lesion is treatable from a percutaneous approach. However, the multisegmental LAD disease involving D2 and CTO of the LCx are less favorable for stenting. 2. Start heparin infusion 2 hours after removal of TR band. 3. Aggressive secondary prevention of coronary artery disease.  Nelva Bush, MD  Diagnostic Dominance: Right  Echo: 04/25/20  IMPRESSIONS    1. Left ventricular ejection fraction,  by estimation, is 55 to 60%. The  left ventricle has normal function. The left ventricle demonstrates global  hypokinesis. Left ventricular diastolic parameters are consistent with  Grade I diastolic dysfunction  (impaired relaxation). The average left ventricular global longitudinal  strain is -15.5 %.  2. Right ventricular systolic function is normal. The right ventricular  size is normal.  3. The mitral valve is normal in structure. No evidence of mitral valve  regurgitation. No evidence of mitral stenosis.  4. The aortic valve is normal in structure. Aortic valve regurgitation is  not visualized. No aortic stenosis is present.  5. The inferior vena cava is normal in size with greater than 50%  respiratory variability, suggesting right atrial pressure of 3 mmHg.   Patient Profile     69 y.o. male with a PMH of PAF, carotid stenosis, dyslipidemia who was seen in the office on 10/19 by [name] where he was having shortness of breath and intermittent jaw pain. He was sent for outpatient CCTA which was abnormal and then set up for cardiac cath. Found to have multivessel coronary disease, now planned for CABG  Assessment & Plan    1. CAD: underwent cardiac cath noted above with Dr. Saunders Revel with significant 3v CAD with 60% LM disease. TCTS consulted for CABG evaluation +/- LA Clip/MAZE. Seen by Dr.  Prescott Gum with plans for CABG on 11/18. Continues with intermittent episodes of Afib noted overnight, suspect he would benefit from LA clip/Maze with tachy/brady syndrome noted. Echo with normal EF, G1DD -- remains on IV heparin, asa, BB.   2. Tachy/brady syndrome with PAF: intermittent episodes since 02/2018. Episode developed while in the cath lab. Started on low dose metoprolol for rate control 12.5mg  BID post cath. Started amiodarone 200mg  daily (given baseline bradycardia) yesterday. Had another episode this morning.  -- remains on IV heparin  3. Dyslipidemia: has been statin intolerant.   -- LDL 140, on zetia. Has been seen in the lipid clinic, discussed [id number] with PharmD there. They are working on possible coverage.   For questions or updates, please contact [place] Please consult [email or web address] for contact info under        Signed, Reino Bellis, NP  04/27/2020, 7:52 AM

## 2020-04-28 ENCOUNTER — Inpatient Hospital Stay (HOSPITAL_COMMUNITY): Payer: Medicare Other

## 2020-04-28 ENCOUNTER — Inpatient Hospital Stay (HOSPITAL_COMMUNITY): Payer: Medicare Other | Admitting: Registered Nurse

## 2020-04-28 ENCOUNTER — Encounter (HOSPITAL_COMMUNITY): Payer: Self-pay | Admitting: Internal Medicine

## 2020-04-28 ENCOUNTER — Inpatient Hospital Stay (HOSPITAL_COMMUNITY)
Admission: RE | Disposition: A | Discharge status: Home Health | Payer: Self-pay | Source: Home / Self Care | Attending: Cardiothoracic Surgery

## 2020-04-28 DIAGNOSIS — Z951 Presence of aortocoronary bypass graft: Secondary | ICD-10-CM

## 2020-04-28 DIAGNOSIS — I25110 Atherosclerotic heart disease of native coronary artery with unstable angina pectoris: Secondary | ICD-10-CM | POA: Diagnosis not present

## 2020-04-28 HISTORY — PX: CORONARY ARTERY BYPASS GRAFT: SHX141

## 2020-04-28 HISTORY — PX: MAZE: SHX5063

## 2020-04-28 HISTORY — PX: TEE WITHOUT CARDIOVERSION: SHX5443

## 2020-04-28 HISTORY — PX: CLIPPING OF ATRIAL APPENDAGE: SHX5773

## 2020-04-28 HISTORY — PX: ENDOVEIN HARVEST OF GREATER SAPHENOUS VEIN: SHX5059

## 2020-04-28 LAB — POCT I-STAT 7, (LYTES, BLD GAS, ICA,H+H)
Acid-Base Excess: 0 mmol/L (ref 0.0–2.0)
Acid-base deficit: 1 mmol/L (ref 0.0–2.0)
Acid-base deficit: 3 mmol/L — ABNORMAL HIGH (ref 0.0–2.0)
Acid-base deficit: 1 mmol/L (ref 0.0–2.0)
Acid-base deficit: 2 mmol/L (ref 0.0–2.0)
Acid-base deficit: 3 mmol/L — ABNORMAL HIGH (ref 0.0–2.0)
Acid-base deficit: 4 mmol/L — ABNORMAL HIGH (ref 0.0–2.0)
Bicarbonate: 24.6 mmol/L (ref 20.0–28.0)
Bicarbonate: 27.1 mmol/L (ref 20.0–28.0)
Bicarbonate: 24.5 mmol/L (ref 20.0–28.0)
Bicarbonate: 24.2 mmol/L (ref 20.0–28.0)
Bicarbonate: 23.9 mmol/L (ref 20.0–28.0)
Bicarbonate: 21.5 mmol/L (ref 20.0–28.0)
Bicarbonate: 20.6 mmol/L (ref 20.0–28.0)
Calcium, Ion: 1.26 mmol/L (ref 1.15–1.40)
Calcium, Ion: 1.1 mmol/L — ABNORMAL LOW (ref 1.15–1.40)
Calcium, Ion: 1.12 mmol/L — ABNORMAL LOW (ref 1.15–1.40)
Calcium, Ion: 1.03 mmol/L — ABNORMAL LOW (ref 1.15–1.40)
Calcium, Ion: 1.13 mmol/L — ABNORMAL LOW (ref 1.15–1.40)
Calcium, Ion: 1.11 mmol/L — ABNORMAL LOW (ref 1.15–1.40)
Calcium, Ion: 1.1 mmol/L — ABNORMAL LOW (ref 1.15–1.40)
HCT: 42 % (ref 39.0–52.0)
HCT: 32 % — ABNORMAL LOW (ref 39.0–52.0)
HCT: 34 % — ABNORMAL LOW (ref 39.0–52.0)
HCT: 29 % — ABNORMAL LOW (ref 39.0–52.0)
HCT: 28 % — ABNORMAL LOW (ref 39.0–52.0)
HCT: 30 % — ABNORMAL LOW (ref 39.0–52.0)
HCT: 28 % — ABNORMAL LOW (ref 39.0–52.0)
Hemoglobin: 14.3 g/dL (ref 13.0–17.0)
Hemoglobin: 10.9 g/dL — ABNORMAL LOW (ref 13.0–17.0)
Hemoglobin: 11.6 g/dL — ABNORMAL LOW (ref 13.0–17.0)
Hemoglobin: 9.9 g/dL — ABNORMAL LOW (ref 13.0–17.0)
Hemoglobin: 9.5 g/dL — ABNORMAL LOW (ref 13.0–17.0)
Hemoglobin: 10.2 g/dL — ABNORMAL LOW (ref 13.0–17.0)
Hemoglobin: 9.5 g/dL — ABNORMAL LOW (ref 13.0–17.0)
O2 Saturation: 100 %
O2 Saturation: 100 %
O2 Saturation: 100 %
O2 Saturation: 100 %
O2 Saturation: 94 %
O2 Saturation: 97 %
O2 Saturation: 97 %
Patient temperature: 36.2
Patient temperature: 36.5
Patient temperature: 36.7
Potassium: 4.1 mmol/L (ref 3.5–5.1)
Potassium: 4.3 mmol/L (ref 3.5–5.1)
Potassium: 5.9 mmol/L — ABNORMAL HIGH (ref 3.5–5.1)
Potassium: 4.5 mmol/L (ref 3.5–5.1)
Potassium: 4.2 mmol/L (ref 3.5–5.1)
Potassium: 4.3 mmol/L (ref 3.5–5.1)
Potassium: 4.2 mmol/L (ref 3.5–5.1)
Sodium: 138 mmol/L (ref 135–145)
Sodium: 139 mmol/L (ref 135–145)
Sodium: 136 mmol/L (ref 135–145)
Sodium: 140 mmol/L (ref 135–145)
Sodium: 140 mmol/L (ref 135–145)
Sodium: 139 mmol/L (ref 135–145)
Sodium: 139 mmol/L (ref 135–145)
TCO2: 26 mmol/L (ref 22–32)
TCO2: 29 mmol/L (ref 22–32)
TCO2: 26 mmol/L (ref 22–32)
TCO2: 25 mmol/L (ref 22–32)
TCO2: 25 mmol/L (ref 22–32)
TCO2: 23 mmol/L (ref 22–32)
TCO2: 22 mmol/L (ref 22–32)
pCO2 arterial: 42.3 mmHg (ref 32.0–48.0)
pCO2 arterial: 53.3 mmHg — ABNORMAL HIGH (ref 32.0–48.0)
pCO2 arterial: 51.5 mmHg — ABNORMAL HIGH (ref 32.0–48.0)
pCO2 arterial: 43.4 mmHg (ref 32.0–48.0)
pCO2 arterial: 43.9 mmHg (ref 32.0–48.0)
pCO2 arterial: 34.6 mmHg (ref 32.0–48.0)
pCO2 arterial: 34 mmHg (ref 32.0–48.0)
pH, Arterial: 7.372 (ref 7.350–7.450)
pH, Arterial: 7.314 — ABNORMAL LOW (ref 7.350–7.450)
pH, Arterial: 7.285 — ABNORMAL LOW (ref 7.350–7.450)
pH, Arterial: 7.354 (ref 7.350–7.450)
pH, Arterial: 7.341 — ABNORMAL LOW (ref 7.350–7.450)
pH, Arterial: 7.399 (ref 7.350–7.450)
pH, Arterial: 7.39 (ref 7.350–7.450)
pO2, Arterial: 344 mmHg — ABNORMAL HIGH (ref 83.0–108.0)
pO2, Arterial: 349 mmHg — ABNORMAL HIGH (ref 83.0–108.0)
pO2, Arterial: 364 mmHg — ABNORMAL HIGH (ref 83.0–108.0)
pO2, Arterial: 399 mmHg — ABNORMAL HIGH (ref 83.0–108.0)
pO2, Arterial: 72 mmHg — ABNORMAL LOW (ref 83.0–108.0)
pO2, Arterial: 87 mmHg (ref 83.0–108.0)
pO2, Arterial: 88 mmHg (ref 83.0–108.0)

## 2020-04-28 LAB — ECHO INTRAOPERATIVE TEE
Height: 68 in
Weight: 2758.4 oz

## 2020-04-28 LAB — CBC
HCT: 45.3 % (ref 39.0–52.0)
HCT: 32 % — ABNORMAL LOW (ref 39.0–52.0)
HCT: 31.9 % — ABNORMAL LOW (ref 39.0–52.0)
Hemoglobin: 14.8 g/dL (ref 13.0–17.0)
Hemoglobin: 10.3 g/dL — ABNORMAL LOW (ref 13.0–17.0)
Hemoglobin: 10.3 g/dL — ABNORMAL LOW (ref 13.0–17.0)
MCH: 30.7 pg (ref 26.0–34.0)
MCH: 31 pg (ref 26.0–34.0)
MCH: 30.7 pg (ref 26.0–34.0)
MCHC: 32.7 g/dL (ref 30.0–36.0)
MCHC: 32.2 g/dL (ref 30.0–36.0)
MCHC: 32.3 g/dL (ref 30.0–36.0)
MCV: 94 fL (ref 80.0–100.0)
MCV: 96.4 fL (ref 80.0–100.0)
MCV: 95.2 fL (ref 80.0–100.0)
Platelets: 225 10*3/uL (ref 150–400)
Platelets: 133 10*3/uL — ABNORMAL LOW (ref 150–400)
Platelets: 152 10*3/uL (ref 150–400)
RBC: 4.82 MIL/uL (ref 4.22–5.81)
RBC: 3.32 MIL/uL — ABNORMAL LOW (ref 4.22–5.81)
RBC: 3.35 MIL/uL — ABNORMAL LOW (ref 4.22–5.81)
RDW: 12.1 % (ref 11.5–15.5)
RDW: 12.1 % (ref 11.5–15.5)
RDW: 12.1 % (ref 11.5–15.5)
WBC: 11 10*3/uL — ABNORMAL HIGH (ref 4.0–10.5)
WBC: 12.2 10*3/uL — ABNORMAL HIGH (ref 4.0–10.5)
WBC: 12.8 10*3/uL — ABNORMAL HIGH (ref 4.0–10.5)
nRBC: 0 % (ref 0.0–0.2)
nRBC: 0 % (ref 0.0–0.2)
nRBC: 0 % (ref 0.0–0.2)

## 2020-04-28 LAB — POCT I-STAT EG7
Acid-base deficit: 2 mmol/L (ref 0.0–2.0)
Bicarbonate: 25 mmol/L (ref 20.0–28.0)
Calcium, Ion: 0.96 mmol/L — ABNORMAL LOW (ref 1.15–1.40)
HCT: 34 % — ABNORMAL LOW (ref 39.0–52.0)
Hemoglobin: 11.6 g/dL — ABNORMAL LOW (ref 13.0–17.0)
O2 Saturation: 83 %
Potassium: 4.1 mmol/L (ref 3.5–5.1)
Sodium: 137 mmol/L (ref 135–145)
TCO2: 27 mmol/L (ref 22–32)
pCO2, Ven: 52.9 mmHg (ref 44.0–60.0)
pH, Ven: 7.283 (ref 7.250–7.430)
pO2, Ven: 53 mmHg — ABNORMAL HIGH (ref 32.0–45.0)

## 2020-04-28 LAB — POCT I-STAT, CHEM 8
BUN: 11 mg/dL (ref 8–23)
BUN: 11 mg/dL (ref 8–23)
BUN: 12 mg/dL (ref 8–23)
BUN: 11 mg/dL (ref 8–23)
BUN: 12 mg/dL (ref 8–23)
BUN: 12 mg/dL (ref 8–23)
BUN: 11 mg/dL (ref 8–23)
Calcium, Ion: 1.28 mmol/L (ref 1.15–1.40)
Calcium, Ion: 1.25 mmol/L (ref 1.15–1.40)
Calcium, Ion: 1.14 mmol/L — ABNORMAL LOW (ref 1.15–1.40)
Calcium, Ion: 1.12 mmol/L — ABNORMAL LOW (ref 1.15–1.40)
Calcium, Ion: 1.06 mmol/L — ABNORMAL LOW (ref 1.15–1.40)
Calcium, Ion: 1.08 mmol/L — ABNORMAL LOW (ref 1.15–1.40)
Calcium, Ion: 1.04 mmol/L — ABNORMAL LOW (ref 1.15–1.40)
Chloride: 103 mmol/L (ref 98–111)
Chloride: 104 mmol/L (ref 98–111)
Chloride: 102 mmol/L (ref 98–111)
Chloride: 103 mmol/L (ref 98–111)
Chloride: 105 mmol/L (ref 98–111)
Chloride: 105 mmol/L (ref 98–111)
Chloride: 105 mmol/L (ref 98–111)
Creatinine, Ser: 0.5 mg/dL — ABNORMAL LOW (ref 0.61–1.24)
Creatinine, Ser: 0.6 mg/dL — ABNORMAL LOW (ref 0.61–1.24)
Creatinine, Ser: 0.6 mg/dL — ABNORMAL LOW (ref 0.61–1.24)
Creatinine, Ser: 0.7 mg/dL (ref 0.61–1.24)
Creatinine, Ser: 0.5 mg/dL — ABNORMAL LOW (ref 0.61–1.24)
Creatinine, Ser: 0.6 mg/dL — ABNORMAL LOW (ref 0.61–1.24)
Creatinine, Ser: 0.6 mg/dL — ABNORMAL LOW (ref 0.61–1.24)
Glucose, Bld: 112 mg/dL — ABNORMAL HIGH (ref 70–99)
Glucose, Bld: 109 mg/dL — ABNORMAL HIGH (ref 70–99)
Glucose, Bld: 155 mg/dL — ABNORMAL HIGH (ref 70–99)
Glucose, Bld: 186 mg/dL — ABNORMAL HIGH (ref 70–99)
Glucose, Bld: 153 mg/dL — ABNORMAL HIGH (ref 70–99)
Glucose, Bld: 174 mg/dL — ABNORMAL HIGH (ref 70–99)
Glucose, Bld: 125 mg/dL — ABNORMAL HIGH (ref 70–99)
HCT: 47 % (ref 39.0–52.0)
HCT: 42 % (ref 39.0–52.0)
HCT: 38 % — ABNORMAL LOW (ref 39.0–52.0)
HCT: 34 % — ABNORMAL LOW (ref 39.0–52.0)
HCT: 34 % — ABNORMAL LOW (ref 39.0–52.0)
HCT: 35 % — ABNORMAL LOW (ref 39.0–52.0)
HCT: 32 % — ABNORMAL LOW (ref 39.0–52.0)
Hemoglobin: 16 g/dL (ref 13.0–17.0)
Hemoglobin: 14.3 g/dL (ref 13.0–17.0)
Hemoglobin: 12.9 g/dL — ABNORMAL LOW (ref 13.0–17.0)
Hemoglobin: 11.6 g/dL — ABNORMAL LOW (ref 13.0–17.0)
Hemoglobin: 11.6 g/dL — ABNORMAL LOW (ref 13.0–17.0)
Hemoglobin: 11.9 g/dL — ABNORMAL LOW (ref 13.0–17.0)
Hemoglobin: 10.9 g/dL — ABNORMAL LOW (ref 13.0–17.0)
Potassium: 4 mmol/L (ref 3.5–5.1)
Potassium: 4.4 mmol/L (ref 3.5–5.1)
Potassium: 4.6 mmol/L (ref 3.5–5.1)
Potassium: 4.9 mmol/L (ref 3.5–5.1)
Potassium: 5.8 mmol/L — ABNORMAL HIGH (ref 3.5–5.1)
Potassium: 6 mmol/L — ABNORMAL HIGH (ref 3.5–5.1)
Potassium: 4.4 mmol/L (ref 3.5–5.1)
Sodium: 138 mmol/L (ref 135–145)
Sodium: 137 mmol/L (ref 135–145)
Sodium: 137 mmol/L (ref 135–145)
Sodium: 138 mmol/L (ref 135–145)
Sodium: 136 mmol/L (ref 135–145)
Sodium: 137 mmol/L (ref 135–145)
Sodium: 141 mmol/L (ref 135–145)
TCO2: 21 mmol/L — ABNORMAL LOW (ref 22–32)
TCO2: 22 mmol/L (ref 22–32)
TCO2: 25 mmol/L (ref 22–32)
TCO2: 23 mmol/L (ref 22–32)
TCO2: 21 mmol/L — ABNORMAL LOW (ref 22–32)
TCO2: 22 mmol/L (ref 22–32)
TCO2: 22 mmol/L (ref 22–32)

## 2020-04-28 LAB — BASIC METABOLIC PANEL
Anion gap: 11 (ref 5–15)
Anion gap: 9 (ref 5–15)
BUN: 12 mg/dL (ref 8–23)
BUN: 9 mg/dL (ref 8–23)
CO2: 25 mmol/L (ref 22–32)
CO2: 20 mmol/L — ABNORMAL LOW (ref 22–32)
Calcium: 9.5 mg/dL (ref 8.9–10.3)
Calcium: 7.7 mg/dL — ABNORMAL LOW (ref 8.9–10.3)
Chloride: 104 mmol/L (ref 98–111)
Chloride: 109 mmol/L (ref 98–111)
Creatinine, Ser: 0.97 mg/dL (ref 0.61–1.24)
Creatinine, Ser: 0.79 mg/dL (ref 0.61–1.24)
GFR, Estimated: >60 mL/min (ref >60)
GFR, Estimated: >60 mL/min (ref >60)
Glucose, Bld: 123 mg/dL — ABNORMAL HIGH (ref 70–99)
Glucose, Bld: 158 mg/dL — ABNORMAL HIGH (ref 70–99)
Potassium: 4 mmol/L (ref 3.5–5.1)
Potassium: 4.2 mmol/L (ref 3.5–5.1)
Sodium: 140 mmol/L (ref 135–145)
Sodium: 138 mmol/L (ref 135–145)

## 2020-04-28 LAB — HEPARIN LEVEL (UNFRACTIONATED): Heparin Unfractionated: 0.28 IU/mL — ABNORMAL LOW (ref 0.30–0.70)

## 2020-04-28 LAB — COOXEMETRY PANEL
Carboxyhemoglobin: 1.1 % (ref 0.5–1.5)
Methemoglobin: 1 % (ref 0.0–1.5)
O2 Saturation: 61.4 %
Total hemoglobin: 11 g/dL — ABNORMAL LOW (ref 12.0–16.0)

## 2020-04-28 LAB — HEMOGLOBIN AND HEMATOCRIT, BLOOD
HCT: 35.7 % — ABNORMAL LOW (ref 39.0–52.0)
Hemoglobin: 11.7 g/dL — ABNORMAL LOW (ref 13.0–17.0)

## 2020-04-28 LAB — GLUCOSE, CAPILLARY
Glucose-Capillary: 133 mg/dL — ABNORMAL HIGH (ref 70–99)
Glucose-Capillary: 137 mg/dL — ABNORMAL HIGH (ref 70–99)
Glucose-Capillary: 142 mg/dL — ABNORMAL HIGH (ref 70–99)
Glucose-Capillary: 146 mg/dL — ABNORMAL HIGH (ref 70–99)
Glucose-Capillary: 150 mg/dL — ABNORMAL HIGH (ref 70–99)
Glucose-Capillary: 152 mg/dL — ABNORMAL HIGH (ref 70–99)
Glucose-Capillary: 167 mg/dL — ABNORMAL HIGH (ref 70–99)
Glucose-Capillary: 183 mg/dL — ABNORMAL HIGH (ref 70–99)

## 2020-04-28 LAB — APTT: aPTT: 33 seconds (ref 24–36)

## 2020-04-28 LAB — PROTIME-INR
INR: 1.3 — ABNORMAL HIGH (ref 0.8–1.2)
Prothrombin Time: 15.2 seconds (ref 11.4–15.2)

## 2020-04-28 LAB — POCT ACTIVATED CLOTTING TIME: Activated Clotting Time: 104 seconds

## 2020-04-28 LAB — MAGNESIUM: Magnesium: 2.7 mg/dL — ABNORMAL HIGH (ref 1.7–2.4)

## 2020-04-28 LAB — PLATELET COUNT: Platelets: 156 10*3/uL (ref 150–400)

## 2020-04-28 SURGERY — CORONARY ARTERY BYPASS GRAFTING (CABG)
Anesthesia: General | Site: Leg Upper

## 2020-04-28 MED ORDER — CHLORHEXIDINE GLUCONATE 0.12 % MT SOLN
15.0000 mL | OROMUCOSAL | Status: AC
  Administered 2020-04-28: 15 mL via OROMUCOSAL

## 2020-04-28 MED ORDER — HEPARIN SODIUM (PORCINE) 1000 UNIT/ML IJ SOLN
INTRAMUSCULAR | Status: AC
  Filled 2020-04-28: qty 1

## 2020-04-28 MED ORDER — NITROGLYCERIN IN D5W 200-5 MCG/ML-% IV SOLN: 0.0000 ug/min | INTRAVENOUS | Status: DC

## 2020-04-28 MED ORDER — ROCURONIUM BROMIDE 10 MG/ML (PF) SYRINGE
PREFILLED_SYRINGE | INTRAVENOUS | Status: AC
  Filled 2020-04-28: qty 10

## 2020-04-28 MED ORDER — MAGNESIUM SULFATE 4 GM/100ML IV SOLN
INTRAVENOUS | Status: AC
  Administered 2020-04-28: 4 g via INTRAVENOUS
  Filled 2020-04-28: qty 100

## 2020-04-28 MED ORDER — MIDAZOLAM HCL 2 MG/2ML IJ SOLN: 2.0000 mg | INTRAMUSCULAR | Status: DC | PRN

## 2020-04-28 MED ORDER — FENTANYL CITRATE (PF) 250 MCG/5ML IJ SOLN
INTRAMUSCULAR | Status: DC | PRN
  Administered 2020-04-28: 50 ug via INTRAVENOUS
  Administered 2020-04-28: 150 ug via INTRAVENOUS
  Administered 2020-04-28 (×3): 100 ug via INTRAVENOUS
  Administered 2020-04-28: 50 ug via INTRAVENOUS
  Administered 2020-04-28: 100 ug via INTRAVENOUS

## 2020-04-28 MED ORDER — SODIUM CHLORIDE 0.9 % IV SOLN
1.5000 g | Freq: Two times a day (BID) | INTRAVENOUS | Status: AC
  Administered 2020-04-28 – 2020-04-30 (×4): 1.5 g via INTRAVENOUS
  Filled 2020-04-28 (×4): qty 1.5

## 2020-04-28 MED ORDER — PROTAMINE SULFATE 10 MG/ML IV SOLN
INTRAVENOUS | Status: DC | PRN
  Administered 2020-04-28: 280 mg via INTRAVENOUS

## 2020-04-28 MED ORDER — PROPOFOL 10 MG/ML IV BOLUS
INTRAVENOUS | Status: AC
  Filled 2020-04-28: qty 40

## 2020-04-28 MED ORDER — SODIUM CHLORIDE 0.45 % IV SOLN: INTRAVENOUS | Status: DC | PRN

## 2020-04-28 MED ORDER — METOPROLOL TARTRATE 5 MG/5ML IV SOLN
2.5000 mg | INTRAVENOUS | Status: DC | PRN
  Administered 2020-05-01 – 2020-05-07 (×6): 2.5 mg via INTRAVENOUS
  Filled 2020-04-28 (×8): qty 5

## 2020-04-28 MED ORDER — MIDAZOLAM HCL 2 MG/2ML IJ SOLN
INTRAMUSCULAR | Status: AC
  Filled 2020-04-28: qty 4

## 2020-04-28 MED ORDER — ROCURONIUM BROMIDE 10 MG/ML (PF) SYRINGE
PREFILLED_SYRINGE | INTRAVENOUS | Status: DC | PRN
  Administered 2020-04-28: 100 mg via INTRAVENOUS
  Administered 2020-04-28 (×2): 50 mg via INTRAVENOUS
  Administered 2020-04-28: 10 mg via INTRAVENOUS
  Administered 2020-04-28: 50 mg via INTRAVENOUS

## 2020-04-28 MED ORDER — POTASSIUM CHLORIDE 10 MEQ/50ML IV SOLN: 10.0000 meq | INTRAVENOUS | Status: AC

## 2020-04-28 MED ORDER — CHLORHEXIDINE GLUCONATE 0.12 % MT SOLN
15.0000 mL | Freq: Two times a day (BID) | OROMUCOSAL | Status: DC
  Administered 2020-04-29 – 2020-05-11 (×19): 15 mL via OROMUCOSAL
  Filled 2020-04-28 (×20): qty 15

## 2020-04-28 MED ORDER — CHLORHEXIDINE GLUCONATE 0.12% ORAL RINSE (MEDLINE KIT): 15.0000 mL | Freq: Two times a day (BID) | OROMUCOSAL | Status: DC

## 2020-04-28 MED ORDER — BISACODYL 10 MG RE SUPP
10.0000 mg | Freq: Every day | RECTAL | Status: DC
  Filled 2020-04-28: qty 1

## 2020-04-28 MED ORDER — SUCCINYLCHOLINE CHLORIDE 200 MG/10ML IV SOSY
PREFILLED_SYRINGE | INTRAVENOUS | Status: AC
  Filled 2020-04-28: qty 10

## 2020-04-28 MED ORDER — SODIUM CHLORIDE 0.9 % IV SOLN: 250.0000 mL | INTRAVENOUS | Status: DC

## 2020-04-28 MED ORDER — LACTATED RINGERS IV SOLN: 500.0000 mL | Freq: Once | INTRAVENOUS | Status: DC | PRN

## 2020-04-28 MED ORDER — ACETAMINOPHEN 160 MG/5ML PO SOLN: 650.0000 mg | Freq: Once | ORAL | Status: AC

## 2020-04-28 MED ORDER — HEPARIN SODIUM (PORCINE) 1000 UNIT/ML IJ SOLN
INTRAMUSCULAR | Status: DC | PRN
  Administered 2020-04-28: 26000 [IU] via INTRAVENOUS
  Administered 2020-04-28: 2000 [IU] via INTRAVENOUS

## 2020-04-28 MED ORDER — MIDAZOLAM HCL 2 MG/2ML IJ SOLN
INTRAMUSCULAR | Status: AC
  Filled 2020-04-28: qty 2

## 2020-04-28 MED ORDER — GLYCOPYRROLATE PF 0.2 MG/ML IJ SOSY
PREFILLED_SYRINGE | INTRAMUSCULAR | Status: AC
  Filled 2020-04-28: qty 1

## 2020-04-28 MED ORDER — ONDANSETRON HCL 4 MG/2ML IJ SOLN
4.0000 mg | Freq: Four times a day (QID) | INTRAMUSCULAR | Status: DC | PRN
  Administered 2020-04-29 – 2020-05-08 (×7): 4 mg via INTRAVENOUS
  Filled 2020-04-28 (×11): qty 2

## 2020-04-28 MED ORDER — LACTATED RINGERS IV SOLN: INTRAVENOUS | Status: DC

## 2020-04-28 MED ORDER — ALBUMIN HUMAN 5 % IV SOLN
250.0000 mL | INTRAVENOUS | Status: DC | PRN
  Administered 2020-04-28: 12.5 g via INTRAVENOUS

## 2020-04-28 MED ORDER — LIDOCAINE 2% (20 MG/ML) 5 ML SYRINGE
INTRAMUSCULAR | Status: AC
  Filled 2020-04-28: qty 5

## 2020-04-28 MED ORDER — ORAL CARE MOUTH RINSE
15.0000 mL | Freq: Two times a day (BID) | OROMUCOSAL | Status: DC
  Administered 2020-04-30 – 2020-05-10 (×17): 15 mL via OROMUCOSAL

## 2020-04-28 MED ORDER — PROTAMINE SULFATE 10 MG/ML IV SOLN
INTRAVENOUS | Status: AC
  Filled 2020-04-28: qty 25

## 2020-04-28 MED ORDER — SODIUM CHLORIDE 0.9% FLUSH: 3.0000 mL | INTRAVENOUS | Status: DC | PRN

## 2020-04-28 MED ORDER — DOCUSATE SODIUM 100 MG PO CAPS
200.0000 mg | ORAL_CAPSULE | Freq: Every day | ORAL | Status: DC
  Administered 2020-04-29 – 2020-05-11 (×9): 200 mg via ORAL
  Filled 2020-04-28 (×12): qty 2

## 2020-04-28 MED ORDER — FAMOTIDINE IN NACL 20-0.9 MG/50ML-% IV SOLN
INTRAVENOUS | Status: AC
  Administered 2020-04-28: 20 mg via INTRAVENOUS
  Filled 2020-04-28: qty 50

## 2020-04-28 MED ORDER — PROPOFOL 10 MG/ML IV BOLUS
INTRAVENOUS | Status: DC | PRN
  Administered 2020-04-28: 70 mg via INTRAVENOUS

## 2020-04-28 MED ORDER — ROCURONIUM BROMIDE 10 MG/ML (PF) SYRINGE
PREFILLED_SYRINGE | INTRAVENOUS | Status: AC
  Filled 2020-04-28: qty 20

## 2020-04-28 MED ORDER — FENTANYL CITRATE (PF) 250 MCG/5ML IJ SOLN
INTRAMUSCULAR | Status: AC
  Filled 2020-04-28: qty 5

## 2020-04-28 MED ORDER — EPHEDRINE 5 MG/ML INJ
INTRAVENOUS | Status: AC
  Filled 2020-04-28: qty 10

## 2020-04-28 MED ORDER — FENTANYL CITRATE (PF) 250 MCG/5ML IJ SOLN
INTRAMUSCULAR | Status: AC
  Filled 2020-04-28: qty 10

## 2020-04-28 MED ORDER — SODIUM CHLORIDE 0.9% FLUSH
3.0000 mL | Freq: Two times a day (BID) | INTRAVENOUS | Status: DC
  Administered 2020-04-29 – 2020-05-11 (×11): 3 mL via INTRAVENOUS

## 2020-04-28 MED ORDER — HEMOSTATIC AGENTS (NO CHARGE) OPTIME
TOPICAL | Status: DC | PRN
  Administered 2020-04-28 (×5): 1 via TOPICAL

## 2020-04-28 MED ORDER — PROTAMINE SULFATE 10 MG/ML IV SOLN
INTRAVENOUS | Status: AC
  Filled 2020-04-28: qty 5

## 2020-04-28 MED ORDER — DEXMEDETOMIDINE HCL IN NACL 400 MCG/100ML IV SOLN
0.0000 ug/kg/h | INTRAVENOUS | Status: DC
  Administered 2020-04-28: 0.6 ug/kg/h via INTRAVENOUS
  Filled 2020-04-28: qty 100

## 2020-04-28 MED ORDER — MAGNESIUM SULFATE 4 GM/100ML IV SOLN: 4.0000 g | Freq: Once | INTRAVENOUS | Status: AC

## 2020-04-28 MED ORDER — BISACODYL 5 MG PO TBEC
10.0000 mg | DELAYED_RELEASE_TABLET | Freq: Every day | ORAL | Status: DC
  Administered 2020-04-29 – 2020-05-11 (×10): 10 mg via ORAL
  Filled 2020-04-28 (×12): qty 2

## 2020-04-28 MED ORDER — SODIUM CHLORIDE 0.9% FLUSH
10.0000 mL | Freq: Two times a day (BID) | INTRAVENOUS | Status: DC
  Administered 2020-04-29 – 2020-05-01 (×5): 10 mL
  Administered 2020-05-01: 20 mL
  Administered 2020-05-02 – 2020-05-11 (×13): 10 mL

## 2020-04-28 MED ORDER — ASPIRIN EC 325 MG PO TBEC
325.0000 mg | DELAYED_RELEASE_TABLET | Freq: Every day | ORAL | Status: DC
  Administered 2020-04-29 – 2020-05-02 (×4): 325 mg via ORAL
  Filled 2020-04-28 (×4): qty 1

## 2020-04-28 MED ORDER — SODIUM CHLORIDE 0.9 % IV SOLN: INTRAVENOUS | Status: DC

## 2020-04-28 MED ORDER — ORAL CARE MOUTH RINSE: 15.0000 mL | OROMUCOSAL | Status: DC

## 2020-04-28 MED ORDER — 0.9 % SODIUM CHLORIDE (POUR BTL) OPTIME
TOPICAL | Status: DC | PRN
  Administered 2020-04-28: 5000 mL

## 2020-04-28 MED ORDER — OXYCODONE HCL 5 MG PO TABS
5.0000 mg | ORAL_TABLET | ORAL | Status: DC | PRN
  Administered 2020-04-28 – 2020-04-30 (×4): 10 mg via ORAL
  Filled 2020-04-28 (×4): qty 2

## 2020-04-28 MED ORDER — CHLORHEXIDINE GLUCONATE CLOTH 2 % EX PADS
6.0000 | MEDICATED_PAD | Freq: Every day | CUTANEOUS | Status: DC
  Administered 2020-04-28 – 2020-05-11 (×14): 6 via TOPICAL

## 2020-04-28 MED ORDER — ALBUMIN HUMAN 5 % IV SOLN: INTRAVENOUS | Status: DC | PRN

## 2020-04-28 MED ORDER — PANTOPRAZOLE SODIUM 40 MG PO TBEC
40.0000 mg | DELAYED_RELEASE_TABLET | Freq: Every day | ORAL | Status: DC
  Administered 2020-04-30 – 2020-05-08 (×9): 40 mg via ORAL
  Filled 2020-04-28 (×9): qty 1

## 2020-04-28 MED ORDER — ARTIFICIAL TEARS OPHTHALMIC OINT
TOPICAL_OINTMENT | OPHTHALMIC | Status: DC | PRN
  Administered 2020-04-28: 1 via OPHTHALMIC

## 2020-04-28 MED ORDER — METOPROLOL TARTRATE 25 MG/10 ML ORAL SUSPENSION
12.5000 mg | Freq: Two times a day (BID) | ORAL | Status: DC
  Filled 2020-04-28 (×2): qty 5

## 2020-04-28 MED ORDER — LACTATED RINGERS IV SOLN: INTRAVENOUS | Status: DC | PRN

## 2020-04-28 MED ORDER — FAMOTIDINE IN NACL 20-0.9 MG/50ML-% IV SOLN: 20.0000 mg | Freq: Two times a day (BID) | INTRAVENOUS | Status: DC

## 2020-04-28 MED ORDER — ACETAMINOPHEN 500 MG PO TABS
1000.0000 mg | ORAL_TABLET | Freq: Four times a day (QID) | ORAL | Status: AC
  Administered 2020-04-28 – 2020-05-03 (×14): 1000 mg via ORAL
  Filled 2020-04-28 (×17): qty 2

## 2020-04-28 MED ORDER — ACETAMINOPHEN 160 MG/5ML PO SOLN: 1000.0000 mg | Freq: Four times a day (QID) | ORAL | Status: AC

## 2020-04-28 MED ORDER — SODIUM CHLORIDE 0.9 % IV SOLN
20.0000 ug | Freq: Once | INTRAVENOUS | Status: AC
  Administered 2020-04-28: 20 ug via INTRAVENOUS
  Filled 2020-04-28 (×2): qty 5

## 2020-04-28 MED ORDER — ACETAMINOPHEN 650 MG RE SUPP
650.0000 mg | Freq: Once | RECTAL | Status: AC
  Administered 2020-04-28: 650 mg via RECTAL

## 2020-04-28 MED ORDER — ARTIFICIAL TEARS OPHTHALMIC OINT
TOPICAL_OINTMENT | OPHTHALMIC | Status: AC
  Filled 2020-04-28: qty 3.5

## 2020-04-28 MED ORDER — METOPROLOL TARTRATE 12.5 MG HALF TABLET
12.5000 mg | ORAL_TABLET | Freq: Two times a day (BID) | ORAL | Status: DC
  Administered 2020-04-28 – 2020-05-01 (×6): 12.5 mg via ORAL
  Filled 2020-04-28 (×7): qty 1

## 2020-04-28 MED ORDER — SODIUM CHLORIDE 0.9% FLUSH
10.0000 mL | INTRAVENOUS | Status: DC | PRN
  Administered 2020-05-10: 10 mL

## 2020-04-28 MED ORDER — PHENYLEPHRINE HCL (PRESSORS) 10 MG/ML IV SOLN
INTRAVENOUS | Status: DC | PRN
  Administered 2020-04-28: 80 ug via INTRAVENOUS
  Administered 2020-04-28: 40 ug via INTRAVENOUS

## 2020-04-28 MED ORDER — TRAMADOL HCL 50 MG PO TABS
50.0000 mg | ORAL_TABLET | ORAL | Status: DC | PRN
  Administered 2020-04-29 – 2020-05-07 (×2): 50 mg via ORAL
  Administered 2020-05-07: 100 mg via ORAL
  Filled 2020-04-28 (×2): qty 1
  Filled 2020-04-28: qty 2

## 2020-04-28 MED ORDER — PLASMA-LYTE 148 IV SOLN
INTRAVENOUS | Status: DC | PRN
  Administered 2020-04-28: 500 mL via INTRAVASCULAR

## 2020-04-28 MED ORDER — MORPHINE SULFATE (PF) 2 MG/ML IV SOLN
1.0000 mg | INTRAVENOUS | Status: DC | PRN
  Administered 2020-04-28 – 2020-04-30 (×4): 2 mg via INTRAVENOUS
  Filled 2020-04-28 (×6): qty 1

## 2020-04-28 MED ORDER — NEOSTIGMINE METHYLSULFATE 3 MG/3ML IV SOSY
PREFILLED_SYRINGE | INTRAVENOUS | Status: AC
  Filled 2020-04-28: qty 3

## 2020-04-28 MED ORDER — MIDAZOLAM HCL 5 MG/5ML IJ SOLN
INTRAMUSCULAR | Status: DC | PRN
  Administered 2020-04-28: 1 mg via INTRAVENOUS
  Administered 2020-04-28: 2 mg via INTRAVENOUS
  Administered 2020-04-28: 3 mg via INTRAVENOUS
  Administered 2020-04-28 (×2): 2 mg via INTRAVENOUS

## 2020-04-28 MED ORDER — ONDANSETRON HCL 4 MG/2ML IJ SOLN
INTRAMUSCULAR | Status: AC
  Filled 2020-04-28: qty 4

## 2020-04-28 MED ORDER — ASPIRIN 81 MG PO CHEW
324.0000 mg | CHEWABLE_TABLET | Freq: Every day | ORAL | Status: DC
  Filled 2020-04-28: qty 4

## 2020-04-28 MED ORDER — PHENYLEPHRINE HCL-NACL 20-0.9 MG/250ML-% IV SOLN: 0.0000 ug/min | INTRAVENOUS | Status: DC

## 2020-04-28 MED ORDER — SODIUM CHLORIDE (PF) 0.9 % IJ SOLN
INTRAMUSCULAR | Status: AC
  Filled 2020-04-28: qty 10

## 2020-04-28 MED ORDER — DEXTROSE 50 % IV SOLN: 0.0000 mL | INTRAVENOUS | Status: DC | PRN

## 2020-04-28 MED ORDER — PHENYLEPHRINE 40 MCG/ML (10ML) SYRINGE FOR IV PUSH (FOR BLOOD PRESSURE SUPPORT)
PREFILLED_SYRINGE | INTRAVENOUS | Status: AC
  Filled 2020-04-28: qty 20

## 2020-04-28 MED ORDER — VANCOMYCIN HCL IN DEXTROSE 1-5 GM/200ML-% IV SOLN
1000.0000 mg | Freq: Once | INTRAVENOUS | Status: AC
  Administered 2020-04-28: 1000 mg via INTRAVENOUS
  Filled 2020-04-28: qty 200

## 2020-04-28 MED ORDER — MILRINONE LACTATE IN DEXTROSE 20-5 MG/100ML-% IV SOLN
0.2500 ug/kg/min | INTRAVENOUS | Status: DC
  Administered 2020-04-29: 0.25 ug/kg/min via INTRAVENOUS
  Filled 2020-04-28: qty 100

## 2020-04-28 MED ORDER — INSULIN REGULAR(HUMAN) IN NACL 100-0.9 UT/100ML-% IV SOLN: INTRAVENOUS | Status: DC

## 2020-04-28 SURGICAL SUPPLY — 113 items
ADAPTER CARDIO PERF ANTE/RETRO (ADAPTER) ×6 IMPLANT
ADH SKN CLS APL DERMABOND .7 (GAUZE/BANDAGES/DRESSINGS) ×4
ADPR PRFSN 84XANTGRD RTRGD (ADAPTER) ×4
AGENT HMST KT MTR STRL THRMB (HEMOSTASIS) ×4
ATRICLIP EXCLUSION VLAA SYSTEM (Miscellaneous) ×6 IMPLANT
BAG DECANTER FOR FLEXI CONT (MISCELLANEOUS) ×6 IMPLANT
BLADE CLIPPER SURG (BLADE) ×4 IMPLANT
BLADE STERNUM SYSTEM 6 (BLADE) ×6 IMPLANT
BLADE SURG 12 STRL SS (BLADE) ×6 IMPLANT
BNDG ELASTIC 4X5.8 VLCR STR LF (GAUZE/BANDAGES/DRESSINGS) ×6 IMPLANT
BNDG ELASTIC 6X5.8 VLCR STR LF (GAUZE/BANDAGES/DRESSINGS) ×6 IMPLANT
BNDG GAUZE ELAST 4 BULKY (GAUZE/BANDAGES/DRESSINGS) ×6 IMPLANT
CANISTER SUCT 3000ML PPV (MISCELLANEOUS) ×6 IMPLANT
CANNULA GUNDRY RCSP 15FR (MISCELLANEOUS) ×6 IMPLANT
CARDIOBLATE CARDIAC ABLATION (MISCELLANEOUS)
CATH CPB KIT VANTRIGT (MISCELLANEOUS) ×6 IMPLANT
CATH ROBINSON RED A/P 18FR (CATHETERS) ×18 IMPLANT
CATH THORACIC 28FR RT ANG (CATHETERS) ×8 IMPLANT
CLAMP OLL ABLATION (MISCELLANEOUS) ×2 IMPLANT
CLIP FOGARTY SPRING 6M (CLIP) ×2 IMPLANT
DERMABOND ADVANCED (GAUZE/BANDAGES/DRESSINGS) ×2
DERMABOND ADVANCED .7 DNX12 (GAUZE/BANDAGES/DRESSINGS) IMPLANT
DEVICE CARDIOBLATE CARDIAC ABL (MISCELLANEOUS) IMPLANT
DRAIN CHANNEL 32F RND 10.7 FF (WOUND CARE) ×6 IMPLANT
DRAPE CARDIOVASCULAR INCISE (DRAPES) ×6
DRAPE SLUSH/WARMER DISC (DRAPES) ×6 IMPLANT
DRAPE SRG 135X102X78XABS (DRAPES) ×4 IMPLANT
DRSG AQUACEL AG ADV 3.5X14 (GAUZE/BANDAGES/DRESSINGS) ×6 IMPLANT
ELECT BLADE 4.0 EZ CLEAN MEGAD (MISCELLANEOUS) ×6
ELECT BLADE 6.5 EXT (BLADE) ×6 IMPLANT
ELECT CAUTERY BLADE 6.4 (BLADE) ×6 IMPLANT
ELECT REM PT RETURN 9FT ADLT (ELECTROSURGICAL) ×12
ELECTRODE BLDE 4.0 EZ CLN MEGD (MISCELLANEOUS) ×4 IMPLANT
ELECTRODE REM PT RTRN 9FT ADLT (ELECTROSURGICAL) ×8 IMPLANT
FELT TEFLON 1X6 (MISCELLANEOUS) ×10 IMPLANT
GAUZE SPONGE 4X4 12PLY STRL (GAUZE/BANDAGES/DRESSINGS) ×12 IMPLANT
GAUZE SPONGE 4X4 12PLY STRL LF (GAUZE/BANDAGES/DRESSINGS) ×4 IMPLANT
GLOVE BIO SURGEON STRL SZ 6.5 (GLOVE) ×3 IMPLANT
GLOVE BIO SURGEON STRL SZ7.5 (GLOVE) ×24 IMPLANT
GLOVE BIO SURGEONS STRL SZ 6.5 (GLOVE) ×3
GLOVE BIOGEL PI IND STRL 6 (GLOVE) IMPLANT
GLOVE BIOGEL PI IND STRL 6.5 (GLOVE) IMPLANT
GLOVE BIOGEL PI IND STRL 7.5 (GLOVE) IMPLANT
GLOVE BIOGEL PI IND STRL 8.5 (GLOVE) IMPLANT
GLOVE BIOGEL PI INDICATOR 6 (GLOVE) ×4
GLOVE BIOGEL PI INDICATOR 6.5 (GLOVE) ×6
GLOVE BIOGEL PI INDICATOR 7.5 (GLOVE) ×6
GLOVE BIOGEL PI INDICATOR 8.5 (GLOVE) ×2
GLOVE SURG SS PI 6.0 STRL IVOR (GLOVE) ×2 IMPLANT
GOWN STRL REUS W/ TWL LRG LVL3 (GOWN DISPOSABLE) ×16 IMPLANT
GOWN STRL REUS W/TWL LRG LVL3 (GOWN DISPOSABLE) ×48
HEMOSTAT POWDER SURGIFOAM 1G (HEMOSTASIS) ×18 IMPLANT
HEMOSTAT SURGICEL 2X14 (HEMOSTASIS) ×6 IMPLANT
INSERT FOGARTY XLG (MISCELLANEOUS) IMPLANT
KIT BASIN OR (CUSTOM PROCEDURE TRAY) ×6 IMPLANT
KIT SUCTION CATH 14FR (SUCTIONS) ×6 IMPLANT
KIT TURNOVER KIT B (KITS) ×6 IMPLANT
KIT VASOVIEW HEMOPRO 2 VH 4000 (KITS) ×6 IMPLANT
LEAD PACING MYOCARDI (MISCELLANEOUS) ×6 IMPLANT
LOOP VESSEL SUPERMAXI WHITE (MISCELLANEOUS) ×2 IMPLANT
MARKER GRAFT CORONARY BYPASS (MISCELLANEOUS) ×18 IMPLANT
NS IRRIG 1000ML POUR BTL (IV SOLUTION) ×30 IMPLANT
PACK E OPEN HEART (SUTURE) ×6 IMPLANT
PACK OPEN HEART (CUSTOM PROCEDURE TRAY) ×6 IMPLANT
PAD ARMBOARD 7.5X6 YLW CONV (MISCELLANEOUS) ×12 IMPLANT
PAD ELECT DEFIB RADIOL ZOLL (MISCELLANEOUS) ×6 IMPLANT
PENCIL BUTTON HOLSTER BLD 10FT (ELECTRODE) ×6 IMPLANT
POSITIONER HEAD DONUT 9IN (MISCELLANEOUS) ×6 IMPLANT
PROBE CRYO2-ABLATION MALLABLE (MISCELLANEOUS) IMPLANT
PUNCH AORTIC ROTATE 4.5MM 8IN (MISCELLANEOUS) ×2 IMPLANT
SET CARDIOPLEGIA MPS 5001102 (MISCELLANEOUS) ×2 IMPLANT
SPONGE LAP 18X18 RF (DISPOSABLE) ×4 IMPLANT
SUPPORT HEART JANKE-BARRON (MISCELLANEOUS) ×6 IMPLANT
SURGIFLO W/THROMBIN 8M KIT (HEMOSTASIS) ×6 IMPLANT
SUT BONE WAX W31G (SUTURE) ×6 IMPLANT
SUT ETHIBOND 2 0 SH (SUTURE) ×6
SUT ETHIBOND 2 0 SH 36X2 (SUTURE) IMPLANT
SUT MNCRL AB 4-0 PS2 18 (SUTURE) IMPLANT
SUT PROLENE 3 0 SH DA (SUTURE) IMPLANT
SUT PROLENE 3 0 SH1 36 (SUTURE) IMPLANT
SUT PROLENE 4 0 RB 1 (SUTURE) ×6
SUT PROLENE 4 0 SH DA (SUTURE) ×8 IMPLANT
SUT PROLENE 4-0 RB1 .5 CRCL 36 (SUTURE) ×4 IMPLANT
SUT PROLENE 5 0 C 1 36 (SUTURE) ×6 IMPLANT
SUT PROLENE 6 0 C 1 30 (SUTURE) ×8 IMPLANT
SUT PROLENE 6 0 CC (SUTURE) ×34 IMPLANT
SUT PROLENE 8 0 BV175 6 (SUTURE) ×6 IMPLANT
SUT PROLENE BLUE 7 0 (SUTURE) ×12 IMPLANT
SUT PROLENE POLY MONO (SUTURE) ×14 IMPLANT
SUT SILK  1 MH (SUTURE)
SUT SILK 1 MH (SUTURE) IMPLANT
SUT SILK 2 0 SH CR/8 (SUTURE) ×2 IMPLANT
SUT SILK 3 0 SH CR/8 (SUTURE) IMPLANT
SUT STEEL 6MS V (SUTURE) ×10 IMPLANT
SUT STEEL SZ 6 DBL 3X14 BALL (SUTURE) ×8 IMPLANT
SUT VIC AB 1 CTX 36 (SUTURE) ×18
SUT VIC AB 1 CTX36XBRD ANBCTR (SUTURE) ×8 IMPLANT
SUT VIC AB 2-0 CT1 27 (SUTURE) ×12
SUT VIC AB 2-0 CT1 TAPERPNT 27 (SUTURE) IMPLANT
SUT VIC AB 2-0 CTX 27 (SUTURE) IMPLANT
SUT VIC AB 3-0 X1 27 (SUTURE) ×4 IMPLANT
SYSTEM EXCLUSION ATRICLIP VLAA (Miscellaneous) IMPLANT
SYSTEM SAHARA CHEST DRAIN ATS (WOUND CARE) ×6 IMPLANT
TAPE CLOTH SURG 4X10 WHT LF (GAUZE/BANDAGES/DRESSINGS) ×2 IMPLANT
TAPE PAPER 2X10 WHT MICROPORE (GAUZE/BANDAGES/DRESSINGS) ×2 IMPLANT
TOWEL GREEN STERILE (TOWEL DISPOSABLE) ×6 IMPLANT
TOWEL GREEN STERILE FF (TOWEL DISPOSABLE) ×6 IMPLANT
TRAY FOLEY SLVR 16FR TEMP STAT (SET/KITS/TRAYS/PACK) ×6 IMPLANT
TUBE CONNECTING 20'X1/4 (TUBING) ×1
TUBE CONNECTING 20X1/4 (TUBING) ×1 IMPLANT
TUBING LAP HI FLOW INSUFFLATIO (TUBING) ×6 IMPLANT
UNDERPAD 30X36 HEAVY ABSORB (UNDERPADS AND DIAPERS) ×6 IMPLANT
WATER STERILE IRR 1000ML POUR (IV SOLUTION) ×12 IMPLANT

## 2020-04-28 NOTE — Anesthesia Postprocedure Evaluation (Signed)
Anesthesia Post Note  Patient: Guy Morris  Procedure(s) Performed: CORONARY ARTERY BYPASS GRAFTING (CABG) TIMES FOUR USING LIMA to LAD; ENDOSCOPIC HARVESTED RIGHT GREATER SAPHENOUS VEIN AND MAZE PROCEDURE. (N/A Chest) CLIPPING OF ATRIAL APPENDAGE USING ATRICUE 40 MM ATRICLIP EXCLUSION VLAA SYSTEM (Left Chest) MAZE USING ATRICURE EMT1 ISOLATOR ABLATION SYSTEM (N/A Chest) TRANSESOPHAGEAL ECHOCARDIOGRAM (TEE) (N/A ) ENDOVEIN HARVEST OF GREATER SAPHENOUS VEIN (Bilateral Leg Upper)     Patient location during evaluation: ICU Anesthesia Type: General Level of consciousness: sedated and patient remains intubated per anesthesia plan Pain management: pain level controlled Vital Signs Assessment: post-procedure vital signs reviewed and stable Respiratory status: patient remains intubated per anesthesia plan Cardiovascular status: stable Postop Assessment: no apparent nausea or vomiting Anesthetic complications: no   No complications documented.  Last Vitals:  Vitals:   04/28/20 0541 04/28/20 1531  BP: 135/71 (!) 97/55  Pulse: 67 89  Resp: 20 12  Temp: 36.4 C   SpO2:  96%    Last Pain:  Vitals:   04/28/20 0541  TempSrc: Oral  PainSc:                  Audry Pili

## 2020-04-28 NOTE — Anesthesia Procedure Notes (Signed)
Central Venous Catheter Insertion Performed by: Darral Dash, DO, anesthesiologist Start/End11/18/2021 6:50 AM, 04/28/2020 6:51 AM Patient location: Pre-op. Preanesthetic checklist: patient identified, IV checked, site marked, risks and benefits discussed, surgical consent, monitors and equipment checked, pre-op evaluation, timeout performed and anesthesia consent Hand hygiene performed  and maximum sterile barriers used  PA cath was placed.Swan type:thermodilution PA Cath depth:50 Procedure performed without using ultrasound guided technique. Attempts: 1 Patient tolerated the procedure well with no immediate complications.

## 2020-04-28 NOTE — Anesthesia Procedure Notes (Signed)
Central Venous Catheter Insertion Performed by: Darral Dash, DO, anesthesiologist Start/End11/18/2021 6:40 AM, 04/28/2020 6:50 AM Patient location: Pre-op. Preanesthetic checklist: patient identified, IV checked, site marked, risks and benefits discussed, surgical consent, monitors and equipment checked, pre-op evaluation, timeout performed and anesthesia consent Position: Trendelenburg Lidocaine 1% used for infiltration and patient sedated Hand hygiene performed  and maximum sterile barriers used  Catheter size: 9 Fr Central line was placed.MAC introducer Procedure performed using ultrasound guided technique. Ultrasound Notes:anatomy identified, needle tip was noted to be adjacent to the nerve/plexus identified, no ultrasound evidence of intravascular and/or intraneural injection and image(s) printed for medical record Attempts: 1 Following insertion, line sutured, dressing applied and Biopatch. Post procedure assessment: blood return through all ports, free fluid flow and no air  Patient tolerated the procedure well with no immediate complications.

## 2020-04-28 NOTE — Anesthesia Procedure Notes (Signed)
Procedure Name: Intubation Date/Time: 04/28/2020 7:38 AM Performed by: Inda Coke, CRNA Pre-anesthesia Checklist: Patient identified, Emergency Drugs available, Suction available and Patient being monitored Patient Re-evaluated:Patient Re-evaluated prior to induction Oxygen Delivery Method: Circle System Utilized Preoxygenation: Pre-oxygenation with 100% oxygen Induction Type: IV induction Ventilation: Mask ventilation without difficulty Laryngoscope Size: Mac and 4 Grade View: Grade II Tube type: Oral Tube size: 8.0 mm Number of attempts: 1 Airway Equipment and Method: Stylet and Oral airway Placement Confirmation: ETT inserted through vocal cords under direct vision,  positive ETCO2 and breath sounds checked- equal and bilateral Secured at: 22 cm Tube secured with: Tape Dental Injury: Teeth and Oropharynx as per pre-operative assessment

## 2020-04-28 NOTE — Progress Notes (Signed)
  Echocardiogram Echocardiogram Transesophageal has been performed.  Guy Morris 04/28/2020, 8:46 AM

## 2020-04-28 NOTE — Progress Notes (Addendum)
Progress Note  Patient Name: Guy Morris Date of Encounter: 04/29/2020  [facility] Cardiologist: Minus Breeding, MD   Subjective   Awake.  Following commands.  Sore in chest.  Procedure performed: CORONARY ARTERY BYPASS GRAFTING (CABG) (N/A) CLIPPING OF ATRIAL APPENDAGE USING ATRICUE 40 MM ATRICLIP EXCLUSION VLAA SYSTEM (Left) MAZE USING ATRICURE EMT1 ISOLATOR ABLATION SYSTEM (N/A) TRANSESOPHAGEAL ECHOCARDIOGRAM (TEE) (N/A) ENDOVEIN HARVEST OF GREATER SAPHENOUS VEIN (Bilateral) LIMA-LAD SVG-OM SVG-PD SVG-DIAG  Left Atrial Pulmonary vein isolation with RF Ablation  Inpatient Medications    Scheduled Meds: . acetaminophen  1,000 mg Oral Q6H   Or  . acetaminophen (TYLENOL) oral liquid 160 mg/5 mL  1,000 mg Per Tube Q6H  . amiodarone  200 mg Oral BID  . aspirin EC  325 mg Oral Daily   Or  . aspirin  324 mg Per Tube Daily  . bisacodyl  10 mg Oral Daily   Or  . bisacodyl  10 mg Rectal Daily  . chlorhexidine  15 mL Mouth Rinse BID  . Chlorhexidine Gluconate Cloth  6 each Topical Daily  . docusate sodium  200 mg Oral Daily  . ezetimibe  10 mg Oral Daily  . levothyroxine  150 mcg Oral Daily  . mouth rinse  15 mL Mouth Rinse q12n4p  . metoprolol tartrate  12.5 mg Oral BID   Or  . metoprolol tartrate  12.5 mg Per Tube BID  . [START ON 04/30/2020] pantoprazole  40 mg Oral Daily  . sodium chloride flush  10-40 mL Intracatheter Q12H  . sodium chloride flush  3 mL Intravenous Q12H   Continuous Infusions: . sodium chloride Stopped (04/29/20 0433)  . sodium chloride    . sodium chloride 20 mL/hr at 04/28/20 1530  . albumin human Stopped (04/28/20 1952)  . cefUROXime (ZINACEF)  IV 200 mL/hr at 04/29/20 0500  . dexmedetomidine (PRECEDEX) IV infusion Stopped (04/28/20 1725)  . famotidine (PEPCID) IV Stopped (04/28/20 1616)  . insulin 1.7 mL/hr at 04/29/20 0500  . lactated ringers    . lactated ringers    . lactated ringers 20 mL/hr at 04/29/20 0500  . milrinone  0.25 mcg/kg/min (04/29/20 0500)  . nitroGLYCERIN 0 mcg/min (04/28/20 1530)  . phenylephrine (NEO-SYNEPHRINE) Adult infusion Stopped (04/29/20 0009)   PRN Meds: sodium chloride, albumin human, albuterol, dextrose, lactated ringers, meclizine, metoprolol tartrate, midazolam, morphine injection, ondansetron (ZOFRAN) IV, oxyCODONE, sodium chloride flush, sodium chloride flush, traMADol   Vital Signs    Vitals:   04/29/20 0200 04/29/20 0300 04/29/20 0400 04/29/20 0500  BP: 104/72 98/70 105/76 95/69  Pulse: 91 89 89 89  Resp: (!) 24 (!) 22 20 (!) 22  Temp: 98.2 F (36.8 C) 98.2 F (36.8 C) 98.4 F (36.9 C) 98.4 F (36.9 C)  TempSrc:   Core   SpO2: 97% 99% 98% 98%  Weight:      Height:        Intake/Output Summary (Last 24 hours) at 04/29/2020 0557 Last data filed at 04/29/2020 0500 Gross per 24 hour  Intake 4821.66 ml  Output 3130 ml  Net 1691.66 ml   Last 3 Weights 04/28/2020 04/27/2020 04/26/2020  Weight (lbs) 172 lb 6.4 oz 176 lb 14.4 oz 178 lb 2.1 oz  Weight (kg) 78.2 kg 80.241 kg 80.8 kg      Telemetry    Atrial pacing.  No atrial fibrillation overnight.- Personally Reviewed  ECG    Immediate post surgery, sinus bradycardia without ST-T wave abnormality.- Personally Reviewed  Physical Exam  Awake, alert, and appropriate. GEN: No acute distress.   Neck: No JVD Cardiac: RRR, soft rub without gallop.  Respiratory: Clear to auscultation bilaterally. GI: Soft, nontender, non-distended  MS:  Puffy hands from volume expansion.; No deformity. Neuro:  Nonfocal  Psych: Normal affect   Labs    High Sensitivity Troponin:  No results for input(s): TROPONINIHS in the last 720 hours.    Chemistry Recent Labs  Lab 04/26/20 0629 04/27/20 0339 04/28/20 0155 04/28/20 0804 04/28/20 1359 04/28/20 1544 04/28/20 2022 04/28/20 2115 04/29/20 0422  NA 138   < > 140   < > 141   < > 139 138 138  K 4.4   < > 4.0   < > 4.4   < > 4.2 4.2 4.1  CL 106   < > 104   < > 105  --    --  109 108  CO2 24   < > 25  --   --   --   --  20* 22  GLUCOSE 105*   < > 123*   < > 125*  --   --  158* 144*  BUN 8   < > 12   < > 11  --   --  9 9  CREATININE 1.01   < > 0.97   < > 0.60*  --   --  0.79 0.74  CALCIUM 9.5   < > 9.5  --   --   --   --  7.7* 7.8*  PROT 6.5  --   --   --   --   --   --   --   --   ALBUMIN 3.8  --   --   --   --   --   --   --   --   AST 20  --   --   --   --   --   --   --   --   ALT 14  --   --   --   --   --   --   --   --   ALKPHOS 60  --   --   --   --   --   --   --   --   BILITOT 0.9  --   --   --   --   --   --   --   --   GFRNONAA >60   < > >60  --   --   --   --  >60 >60  ANIONGAP 8   < > 11  --   --   --   --  9 8   < > = values in this interval not displayed.     Hematology Recent Labs  Lab 04/28/20 1530 04/28/20 1544 04/28/20 2022 04/28/20 2115 04/29/20 0422  WBC 12.2*  --   --  12.8* 9.5  RBC 3.32*  --   --  3.35* 3.11*  HGB 10.3*   < > 9.5* 10.3* 9.7*  HCT 32.0*   < > 28.0* 31.9* 29.5*  MCV 96.4  --   --  95.2 94.9  MCH 31.0  --   --  30.7 31.2  MCHC 32.2  --   --  32.3 32.9  RDW 12.1  --   --  12.1 12.2  PLT 133*  --   --  152 142*   < > = values  in this interval not displayed.    BNPNo results for input(s): BNP, PROBNP in the last 168 hours.   DDimer No results for input(s): DDIMER in the last 168 hours.   Radiology    DG Chest Port 1 View  Result Date: 04/28/2020 CLINICAL DATA:  Postop EXAM: PORTABLE CHEST 1 VIEW COMPARISON:  04/28/2020 FINDINGS: Endotracheal tube remains in good position. Swan-Ganz catheter in the pulmonary outflow tract unchanged. Postop CABG. NG tube in the stomach. Bilateral chest tubes in good position. No pneumothorax or significant pleural effusion. Mild left lower lobe atelectasis. IMPRESSION: Support lines in satisfactory position. No pneumothorax. Mild left lower lobe atelectasis. Electronically Signed   By: Franchot Gallo M.D.   On: 04/28/2020 16:50   DG Chest Portable 1 View  Result Date:  04/28/2020 CLINICAL DATA:  Postoperative CABG. EXAM: PORTABLE CHEST 1 VIEW COMPARISON:  04/26/2020 FINDINGS: Interval post CABG changes to the chest including left atrial appendage clipping and median sternotomy. Endotracheal tube terminates approximately 2.3 cm above the carina. Right IJ sheath with Swan-Ganz catheter terminating within the pulmonary outflow tract. Bilateral chest tubes and mediastinal drain. Slight widening of the superior mediastinum is likely accentuated by AP supine technique. Mild atelectasis within the left mid lung. No pleural effusion or pneumothorax. IMPRESSION: 1. Status post CABG. No pneumothorax. 2. Support lines and tubes in appropriate position. 3. Slight widening of the superior mediastinum, likely accentuated by AP supine technique. Electronically Signed   By: Davina Poke D.O.   On: 04/28/2020 16:07   ECHO INTRAOPERATIVE TEE  Result Date: 04/28/2020  *INTRAOPERATIVE TRANSESOPHAGEAL REPORT *  Patient Name:   Guy Morris Date of Exam: 04/28/2020 Medical Rec #:  [id number]      Height:       68.0 in Accession #:    [id number]     Weight:       172.4 lb Date of Birth:  Jan 23, 1951      BSA:          1.92 m Patient Age:    69 years       BP:           135/71 mmHg Patient Gender: M              HR:           53 bpm. Exam Location:  Inpatient Transesophogeal exam was perform intraoperatively during surgical procedure. Patient was closely monitored under general anesthesia during the entirety of examination. Indications:     I25.110 Atherosclerotic heart disease of native coronary artery                  with unstable angina pectoris; I48.91* Unspeicified atrial                  fibrillation Performing Phys: [id number] PETER VAN TRIGT Diagnosing Phys: Renold Don MD Complications: No known complications during this procedure. POST-OP IMPRESSIONS - Left Ventricle: The left ventricle is unchanged from pre-bypass. - Right Ventricle: The right ventricle appears unchanged from pre-bypass.  - Aorta: The aorta appears unchanged from pre-bypass. - Left Atrium: The left atrium appears unchanged from pre-bypass. - Left Atrial Appendage: The left atrial appendage appears unchanged from pre-bypass. - Aortic Valve: The aortic valve appears unchanged from pre-bypass. - Mitral Valve: The mitral valve appears unchanged from pre-bypass. - Tricuspid Valve: The tricuspid valve appears unchanged from pre-bypass. - Interatrial Septum: The interatrial septum appears unchanged from pre-bypass. - Interventricular Septum: The interventricular septum appears unchanged from  pre-bypass. - Pericardium: The pericardium appears unchanged from pre-bypass. PRE-OP FINDINGS  Left Ventricle: The left ventricle has low normal systolic function, with an ejection fraction of 50-55%. The cavity size was normal. There is no increase in left ventricular wall thickness. Right Ventricle: The right ventricle has normal systolic function. The cavity was normal. There is no increase in right ventricular wall thickness. Left Atrium: Left atrial size was normal in size. The left atrial appendage is well visualized and there is no evidence of thrombus present. Right Atrium: Right atrial size was normal in size. Interatrial Septum: The interatrial septum was not well visualized. Pericardium: There is no evidence of pericardial effusion. Mitral Valve: The mitral valve is normal in structure. Mitral valve regurgitation is trivial by color flow Doppler. There is No evidence of mitral stenosis. Tricuspid Valve: The tricuspid valve was normal in structure. Tricuspid valve regurgitation was not visualized by color flow Doppler. Aortic Valve: The aortic valve is tricuspid Aortic valve regurgitation was not visualized by color flow Doppler. There is no stenosis of the aortic valve. Pulmonic Valve: The pulmonic valve was not well visualized. Pulmonic valve regurgitation was not assessed by color flow Doppler. Aorta: The aortic arch, ascending aorta and  aortic root are normal in size and structure.  Renold Don MD Electronically signed by Renold Don MD Signature Date/Time: 04/28/2020/2:20:25 PM    Final     Cardiac Studies   No new cardiac studies  Patient Profile     69 y.o. male a PMH of PAF, carotid stenosis, dyslipidemia who was seen in the office on 10/19 by [name] where he was having shortness of breath and intermittent jaw pain. He was sent for outpatient CCTA which was abnormal and then set up for cardiac cath which identified severe 3 vessel disease. Had CABG and MAZE/Clip 04/28/2020.   Assessment & Plan    1. Status post multivessel CABG, doing well without ischemic complications. 2. Atrial pacing without recurrences of atrial fibrillation to this point.  Continue oral amiodarone with planned discontinuation in the postoperative course in several months.  He had maze and left atrial appendage ligation.  Watch for bradycardia while loading with amiodarone.   Overall stable this morning     For questions or updates, please contact [place] Please consult [email or web address] for contact info under        Signed, Sinclair Grooms, MD  04/29/2020, 5:57 AM

## 2020-04-28 NOTE — Procedures (Signed)
Extubation Procedure Note  Patient Details:   Name: Guy Morris DOB: 1950/12/18 MRN: [id number]   Airway Documentation:    Vent end date: 04/28/20 Vent end time: 1833   Evaluation  O2 sats: stable throughout Complications: No apparent complications Patient did tolerate procedure well. Bilateral Breath Sounds: Clear, Diminished   Yes, pt could speak post extubation.  Pt extubated to 4 l/m [place] per protocol without difficulty.  Earney Navy 04/28/2020, 6:34 PM

## 2020-04-28 NOTE — Progress Notes (Signed)
CT surgery p.m. Rounds  Patient doing well after CABG/Maze procedure exbated with stable hemodynamics Chest tube drainage 50-100 cc/h  Blood pressure 90/68, pulse 89, temperature 97.9 F (36.6 C), resp. rate 20, height 5\' 8"  (1.727 m), weight 78.2 kg, SpO2 97 %.

## 2020-04-28 NOTE — Anesthesia Procedure Notes (Signed)
Arterial Line Insertion Start/End11/18/2021 7:00 AM, 04/28/2020 7:04 AM Performed by: Inda Coke, CRNA, CRNA  Preanesthetic checklist: patient identified, IV checked, site marked, risks and benefits discussed, surgical consent, monitors and equipment checked, pre-op evaluation, timeout performed and anesthesia consent Lidocaine 1% used for infiltration and patient sedated Left, radial was placed Catheter size: 20 G Hand hygiene performed  and maximum sterile barriers used  Allen's test indicative of satisfactory collateral circulation Attempts: 1 Procedure performed without using ultrasound guided technique. Following insertion, dressing applied and Biopatch. Post procedure assessment: normal  Patient tolerated the procedure well with no immediate complications.

## 2020-04-28 NOTE — Transfer of Care (Signed)
Immediate Anesthesia Transfer of Care Note  Patient: Guy Morris  Procedure(s) Performed: CORONARY ARTERY BYPASS GRAFTING (CABG) TIMES FOUR USING LIMA to LAD; ENDOSCOPIC HARVESTED RIGHT GREATER SAPHENOUS VEIN AND MAZE PROCEDURE. (N/A Chest) CLIPPING OF ATRIAL APPENDAGE USING ATRICUE 40 MM ATRICLIP EXCLUSION VLAA SYSTEM (Left Chest) MAZE USING ATRICURE EMT1 ISOLATOR ABLATION SYSTEM (N/A Chest) TRANSESOPHAGEAL ECHOCARDIOGRAM (TEE) (N/A ) ENDOVEIN HARVEST OF GREATER SAPHENOUS VEIN (Bilateral Leg Upper)  Patient Location: ICU  Anesthesia Type:General  Level of Consciousness: Patient remains intubated per anesthesia plan  Airway & Oxygen Therapy: Patient remains intubated per anesthesia plan and Patient placed on Ventilator (see vital sign flow sheet for setting)  Post-op Assessment: Report given to RN and Post -op Vital signs reviewed and stable  Post vital signs: Reviewed and stable  Last Vitals:  Vitals Value Taken Time  BP    Temp    Pulse    Resp    SpO2      Last Pain:  Vitals:   04/28/20 0541  TempSrc: Oral  PainSc:       Patients Stated Pain Goal: 0 (81/84/03 7543)  Complications: No complications documented.

## 2020-04-28 NOTE — Progress Notes (Signed)
Pre Procedure note for inpatients:   Guy Morris has been scheduled for Procedure(s): CORONARY ARTERY BYPASS GRAFTING (CABG) (N/A) CLIPPING OF ATRIAL APPENDAGE (N/A) MAZE (N/A) TRANSESOPHAGEAL ECHOCARDIOGRAM (TEE) (N/A) today. The various methods of treatment have been discussed with the patient. After consideration of the risks, benefits and treatment options the patient has consented to the planned procedure.   The patient has been seen and labs reviewed. There are no changes in the patient's condition to prevent proceeding with the planned procedure today.  Recent labs:  Lab Results  Component Value Date   WBC 11.0 (H) 04/28/2020   HGB 14.8 04/28/2020   HCT 45.3 04/28/2020   PLT 225 04/28/2020   GLUCOSE 123 (H) 04/28/2020   CHOL 218 (H) 04/27/2020   TRIG 133 04/27/2020   HDL 51 04/27/2020   LDLDIRECT 154.0 02/12/2019   LDLCALC 140 (H) 04/27/2020   ALT 14 04/26/2020   AST 20 04/26/2020   NA 140 04/28/2020   K 4.0 04/28/2020   CL 104 04/28/2020   CREATININE 0.97 04/28/2020   BUN 12 04/28/2020   CO2 25 04/28/2020   TSH 6.254 (H) 04/26/2020   PSA 0.80 08/13/2019   INR 1.1 04/26/2020   HGBA1C 5.6 04/26/2020    Len Childs, MD 04/28/2020 7:24 AM

## 2020-04-28 NOTE — Brief Op Note (Addendum)
04/25/2020 - 04/28/2020  12:39 PM  PATIENT:  Guy Morris  69 y.o. male  PRE-OPERATIVE DIAGNOSIS:  CORONARY ARTERY DISEASE  POST-OPERATIVE DIAGNOSIS:  CORONARY ARTERY DISEASE  PROCEDURE:  Procedure(s): CORONARY ARTERY BYPASS GRAFTING (CABG) (N/A) CLIPPING OF ATRIAL APPENDAGE USING ATRICUE 40 MM ATRICLIP EXCLUSION VLAA SYSTEM (Left) MAZE USING ATRICURE EMT1 ISOLATOR ABLATION SYSTEM (N/A) TRANSESOPHAGEAL ECHOCARDIOGRAM (TEE) (N/A) ENDOVEIN HARVEST OF GREATER SAPHENOUS VEIN (Bilateral) LIMA-LAD SVG-OM SVG-PD SVG-DIAG EVH - 70 minutes Left Atrial Pulmonary vein isolation with RF Ablation  SURGEON:  Surgeon(s) and Role:    Ivin Poot, MD - Primary  PHYSICIAN ASSISTANT: Nicodemus Denk PA-C  ASSISTANTS: STAFF   ANESTHESIA:   general  EBL:  400cc BLOOD ADMINISTERED:none  DRAINS: LEFT PLEURAL AND MEDIATINAL CHEST DRAINS   LOCAL MEDICATIONS USED:  NONE  SPECIMEN:  No Specimen  DISPOSITION OF SPECIMEN:  N/A  COUNTS:  YES  TOURNIQUET:  * No tourniquets in log *  DICTATION: .Other Dictation: Dictation Number PENDING  PLAN OF CARE: Admit to inpatient   PATIENT DISPOSITION:  ICU - intubated and hemodynamically stable.   Delay start of Pharmacological VTE agent (>24hrs) due to surgical blood loss or risk of bleeding: yes  COMPLICATIONS: NO KNOWN

## 2020-04-29 ENCOUNTER — Encounter (HOSPITAL_COMMUNITY): Payer: Self-pay | Admitting: Cardiothoracic Surgery

## 2020-04-29 ENCOUNTER — Inpatient Hospital Stay (HOSPITAL_COMMUNITY): Payer: Medicare Other

## 2020-04-29 DIAGNOSIS — Z951 Presence of aortocoronary bypass graft: Secondary | ICD-10-CM | POA: Diagnosis not present

## 2020-04-29 DIAGNOSIS — I480 Paroxysmal atrial fibrillation: Secondary | ICD-10-CM | POA: Diagnosis not present

## 2020-04-29 DIAGNOSIS — I2510 Atherosclerotic heart disease of native coronary artery without angina pectoris: Secondary | ICD-10-CM

## 2020-04-29 LAB — PREPARE FRESH FROZEN PLASMA: Unit division: 0

## 2020-04-29 LAB — COOXEMETRY PANEL
Carboxyhemoglobin: 1.3 % (ref 0.5–1.5)
Methemoglobin: 1 % (ref 0.0–1.5)
O2 Saturation: 68.9 %
Total hemoglobin: 9.5 g/dL — ABNORMAL LOW (ref 12.0–16.0)

## 2020-04-29 LAB — BPAM FFP
Blood Product Expiration Date: 202111232359
Blood Product Expiration Date: 202111232359
ISSUE DATE / TIME: 202111181326
ISSUE DATE / TIME: 202111181326
Unit Type and Rh: 6200
Unit Type and Rh: 6200

## 2020-04-29 LAB — GLUCOSE, CAPILLARY
Glucose-Capillary: 103 mg/dL — ABNORMAL HIGH (ref 70–99)
Glucose-Capillary: 114 mg/dL — ABNORMAL HIGH (ref 70–99)
Glucose-Capillary: 115 mg/dL — ABNORMAL HIGH (ref 70–99)
Glucose-Capillary: 118 mg/dL — ABNORMAL HIGH (ref 70–99)
Glucose-Capillary: 130 mg/dL — ABNORMAL HIGH (ref 70–99)
Glucose-Capillary: 139 mg/dL — ABNORMAL HIGH (ref 70–99)
Glucose-Capillary: 139 mg/dL — ABNORMAL HIGH (ref 70–99)
Glucose-Capillary: 140 mg/dL — ABNORMAL HIGH (ref 70–99)
Glucose-Capillary: 140 mg/dL — ABNORMAL HIGH (ref 70–99)
Glucose-Capillary: 144 mg/dL — ABNORMAL HIGH (ref 70–99)
Glucose-Capillary: 145 mg/dL — ABNORMAL HIGH (ref 70–99)
Glucose-Capillary: 145 mg/dL — ABNORMAL HIGH (ref 70–99)
Glucose-Capillary: 146 mg/dL — ABNORMAL HIGH (ref 70–99)

## 2020-04-29 LAB — CBC
HCT: 29.5 % — ABNORMAL LOW (ref 39.0–52.0)
HCT: 30.7 % — ABNORMAL LOW (ref 39.0–52.0)
Hemoglobin: 9.7 g/dL — ABNORMAL LOW (ref 13.0–17.0)
Hemoglobin: 10 g/dL — ABNORMAL LOW (ref 13.0–17.0)
MCH: 31.2 pg (ref 26.0–34.0)
MCH: 31.6 pg (ref 26.0–34.0)
MCHC: 32.9 g/dL (ref 30.0–36.0)
MCHC: 32.6 g/dL (ref 30.0–36.0)
MCV: 94.9 fL (ref 80.0–100.0)
MCV: 97.2 fL (ref 80.0–100.0)
Platelets: 142 10*3/uL — ABNORMAL LOW (ref 150–400)
Platelets: 166 10*3/uL (ref 150–400)
RBC: 3.11 MIL/uL — ABNORMAL LOW (ref 4.22–5.81)
RBC: 3.16 MIL/uL — ABNORMAL LOW (ref 4.22–5.81)
RDW: 12.2 % (ref 11.5–15.5)
RDW: 12.6 % (ref 11.5–15.5)
WBC: 9.5 10*3/uL (ref 4.0–10.5)
WBC: 11.5 10*3/uL — ABNORMAL HIGH (ref 4.0–10.5)
nRBC: 0 % (ref 0.0–0.2)
nRBC: 0 % (ref 0.0–0.2)

## 2020-04-29 LAB — BASIC METABOLIC PANEL
Anion gap: 8 (ref 5–15)
Anion gap: 9 (ref 5–15)
BUN: 9 mg/dL (ref 8–23)
BUN: 10 mg/dL (ref 8–23)
CO2: 22 mmol/L (ref 22–32)
CO2: 22 mmol/L (ref 22–32)
Calcium: 7.8 mg/dL — ABNORMAL LOW (ref 8.9–10.3)
Calcium: 8.3 mg/dL — ABNORMAL LOW (ref 8.9–10.3)
Chloride: 108 mmol/L (ref 98–111)
Chloride: 103 mmol/L (ref 98–111)
Creatinine, Ser: 0.74 mg/dL (ref 0.61–1.24)
Creatinine, Ser: 0.86 mg/dL (ref 0.61–1.24)
GFR, Estimated: >60 mL/min (ref >60)
GFR, Estimated: >60 mL/min (ref >60)
Glucose, Bld: 144 mg/dL — ABNORMAL HIGH (ref 70–99)
Glucose, Bld: 157 mg/dL — ABNORMAL HIGH (ref 70–99)
Potassium: 4.1 mmol/L (ref 3.5–5.1)
Potassium: 3.7 mmol/L (ref 3.5–5.1)
Sodium: 138 mmol/L (ref 135–145)
Sodium: 134 mmol/L — ABNORMAL LOW (ref 135–145)

## 2020-04-29 LAB — MAGNESIUM
Magnesium: 2.3 mg/dL (ref 1.7–2.4)
Magnesium: 2.1 mg/dL (ref 1.7–2.4)

## 2020-04-29 MED ORDER — KETOROLAC TROMETHAMINE 15 MG/ML IJ SOLN
15.0000 mg | Freq: Four times a day (QID) | INTRAMUSCULAR | Status: DC
  Administered 2020-04-29 (×2): 15 mg via INTRAVENOUS
  Filled 2020-04-29 (×3): qty 1

## 2020-04-29 MED ORDER — MILRINONE LACTATE IN DEXTROSE 20-5 MG/100ML-% IV SOLN
0.1250 ug/kg/min | INTRAVENOUS | Status: DC
  Administered 2020-04-29 – 2020-05-01 (×2): 0.125 ug/kg/min via INTRAVENOUS
  Filled 2020-04-29 (×2): qty 100

## 2020-04-29 MED ORDER — POTASSIUM CHLORIDE 10 MEQ/50ML IV SOLN
10.0000 meq | INTRAVENOUS | Status: AC
  Administered 2020-04-29: 10 meq via INTRAVENOUS
  Filled 2020-04-29: qty 50

## 2020-04-29 MED ORDER — FUROSEMIDE 10 MG/ML IJ SOLN
20.0000 mg | Freq: Two times a day (BID) | INTRAMUSCULAR | Status: DC
  Administered 2020-04-29 – 2020-05-01 (×6): 20 mg via INTRAVENOUS
  Filled 2020-04-29 (×6): qty 2

## 2020-04-29 MED ORDER — METOCLOPRAMIDE HCL 5 MG/ML IJ SOLN
10.0000 mg | Freq: Four times a day (QID) | INTRAMUSCULAR | Status: DC
  Administered 2020-04-29 – 2020-05-02 (×12): 10 mg via INTRAVENOUS
  Filled 2020-04-29 (×12): qty 2

## 2020-04-29 MED ORDER — INSULIN ASPART 100 UNIT/ML ~~LOC~~ SOLN
0.0000 [IU] | SUBCUTANEOUS | Status: DC
  Administered 2020-04-29 – 2020-05-02 (×4): 2 [IU] via SUBCUTANEOUS

## 2020-04-29 MED ORDER — METOLAZONE 5 MG PO TABS
5.0000 mg | ORAL_TABLET | Freq: Every day | ORAL | Status: AC
  Administered 2020-04-30 – 2020-05-01 (×2): 5 mg via ORAL
  Filled 2020-04-29 (×3): qty 1

## 2020-04-29 MED FILL — Sodium Chloride IV Soln 0.9%: INTRAVENOUS | Qty: 2000 | Status: AC

## 2020-04-29 MED FILL — Magnesium Sulfate Inj 50%: INTRAMUSCULAR | Qty: 10 | Status: AC

## 2020-04-29 MED FILL — Heparin Sodium (Porcine) Inj 1000 Unit/ML: INTRAMUSCULAR | Qty: 30 | Status: AC

## 2020-04-29 MED FILL — Electrolyte-R (PH 7.4) Solution: INTRAVENOUS | Qty: 5000 | Status: AC

## 2020-04-29 MED FILL — Sodium Bicarbonate IV Soln 8.4%: INTRAVENOUS | Qty: 50 | Status: AC

## 2020-04-29 MED FILL — Albumin, Human Inj 5%: INTRAVENOUS | Qty: 250 | Status: AC

## 2020-04-29 MED FILL — Potassium Chloride Inj 2 mEq/ML: INTRAVENOUS | Qty: 40 | Status: AC

## 2020-04-29 MED FILL — Lidocaine HCl Local Soln Prefilled Syringe 100 MG/5ML (2%): INTRAMUSCULAR | Qty: 5 | Status: AC

## 2020-04-29 MED FILL — Mannitol IV Soln 20%: INTRAVENOUS | Qty: 500 | Status: AC

## 2020-04-29 NOTE — Progress Notes (Signed)
EVENING ROUNDS NOTE :     [address]       [place] [address]             [phone]                 1 Day Post-Op Procedure(s) (LRB): CORONARY ARTERY BYPASS GRAFTING (CABG) TIMES FOUR USING LIMA to LAD; ENDOSCOPIC HARVESTED RIGHT GREATER SAPHENOUS VEIN AND MAZE PROCEDURE. (N/A) CLIPPING OF ATRIAL APPENDAGE USING ATRICUE 40 MM ATRICLIP EXCLUSION VLAA SYSTEM (Left) MAZE USING ATRICURE EMT1 ISOLATOR ABLATION SYSTEM (N/A) TRANSESOPHAGEAL ECHOCARDIOGRAM (TEE) (N/A) ENDOVEIN HARVEST OF GREATER SAPHENOUS VEIN (Bilateral)   Total Length of Stay:  LOS: 4 days  Events:   No events Up to chair Stable hemodynamics    BP 125/79   Pulse 96   Temp 97.9 F (36.6 C) (Oral)   Resp (!) 21   Ht 5\' 8"  (1.727 m)   Wt 84.4 kg   SpO2 93%   BMI 28.29 kg/m   PAP: (20-50)/(9-25) 32/19 CO:  [4.5 L/min-6.3 L/min] 6.3 L/min CI:  [2.3 L/min/m2-3.3 L/min/m2] 3.3 L/min/m2  Vent Mode: PSV;CPAP FiO2 (%):  [36 %-40 %] 36 % Set Rate:  [4 bmp] 4 bmp Vt Set:  [540 mL] 540 mL PEEP:  [5 cmH20] 5 cmH20 Pressure Support:  [10 cmH20] 10 cmH20 Plateau Pressure:  [15 cmH20] 15 cmH20  . sodium chloride Stopped (04/29/20 0953)  . sodium chloride    . sodium chloride 20 mL/hr at 04/29/20 1100  . cefUROXime (ZINACEF)  IV Stopped (04/29/20 0504)  . insulin Stopped (04/29/20 1321)  . lactated ringers    . lactated ringers 20 mL/hr at 04/29/20 1555  . milrinone 0.125 mcg/kg/min (04/29/20 0858)  . phenylephrine (NEO-SYNEPHRINE) Adult infusion Stopped (04/29/20 0009)    I/O last 3 completed shifts: In: 5171.2 [P.O.:240; I.V.:2601.3; Blood:805; IV Piggyback:1524.9] Out: 4656 [Urine:2185; Blood:650; Chest Tube:740]   CBC Latest Ref Rng & Units 04/29/2020 04/28/2020 04/28/2020  WBC 4.0 - 10.5 K/uL 9.5 12.8(H) -  Hemoglobin 13.0 - 17.0 g/dL 9.7(L) 10.3(L) 9.5(L)  Hematocrit 39 - 52 % 29.5(L) 31.9(L) 28.0(L)  Platelets 150 - 400 K/uL 142(L) 152 -    BMP Latest Ref Rng & Units  04/29/2020 04/28/2020 04/28/2020  Glucose 70 - 99 mg/dL 144(H) 158(H) -  BUN 8 - 23 mg/dL 9 9 -  Creatinine 0.61 - 1.24 mg/dL 0.74 0.79 -  BUN/Creat Ratio 10 - 24 - - -  Sodium 135 - 145 mmol/L 138 138 139  Potassium 3.5 - 5.1 mmol/L 4.1 4.2 4.2  Chloride 98 - 111 mmol/L 108 109 -  CO2 22 - 32 mmol/L 22 20(L) -  Calcium 8.9 - 10.3 mg/dL 7.8(L) 7.7(L) -    ABG    Component Value Date/Time   PHART 7.390 04/28/2020 2022   PCO2ART 34.0 04/28/2020 2022   PO2ART 88 04/28/2020 2022   HCO3 20.6 04/28/2020 2022   TCO2 22 04/28/2020 2022   ACIDBASEDEF 4.0 (H) 04/28/2020 2022   O2SAT 68.9 04/29/2020 0423       Melodie Bouillon, MD 04/29/2020 4:22 PM

## 2020-04-29 NOTE — Discharge Summary (Addendum)
Physician Discharge Summary  Patient ID: Guy Morris MRN: [id number] DOB/AGE: December 20, 1950 69 y.o.  Admit date: 04/25/2020 Discharge date: 05/11/2020  Admission Diagnoses:Unstable angina  Discharge Diagnoses:  Active Problems:   Paroxysmal atrial fibrillation (HCC)   Unstable angina (HCC)   Hx of CABG   Coronary artery disease involving native heart without angina pectoris   Abdominal distension   Non-intractable vomiting   Chronic constipation   Malnutrition of moderate degree  Patient Active Problem List   Diagnosis Date Noted   Malnutrition of moderate degree 05/11/2020   Abdominal distension    Non-intractable vomiting    Chronic constipation    Coronary artery disease involving native heart without angina pectoris    Hx of CABG 04/28/2020   Unstable angina ([place]) 04/25/2020   Paroxysmal atrial fibrillation ([place]) 03/27/2020   Bilateral carotid artery stenosis 03/27/2020   Vertigo 02/18/2020   Left groin pain 08/26/2019   Cervical spondylosis 08/13/2019   Temporal pain 08/13/2019   Balance disorder 05/13/2018   Neck mass 05/13/2018   General weakness 61/95/0932   Diastolic dysfunction 67/05/4579   Bradycardia 11/05/2017   Hyperglycemia 11/05/2017   Pain in lower jaw 05/08/2016   Teeth grinding 05/08/2016   Borderline systolic HTN 99/83/3825   Dyspnea 11/01/2015   Chest pain 11/01/2015   COPD (chronic obstructive pulmonary disease) ([place]) 11/01/2015   Primary localized osteoarthritis of right knee 12/17/2014   Polycythemia, secondary 08/24/2014   Inguinal hernia 08/24/2014   Asbestosis ([place]) 07/23/2014   Postural dizziness 07/23/2014   Visual disturbance 06/29/2014   Chronic pain syndrome 10/27/2012   Insomnia 04/28/2012   BPH (benign prostatic hyperplasia) 04/28/2012   Urge incontinence 04/28/2012   Heart palpitations 04/28/2012   Vision loss, right eye 10/22/2011   Bladder neck obstruction 10/22/2011   Degenerative joint disease 04/23/2011   RLS  (restless legs syndrome) 10/17/2010   Encounter for long-term (current) use of high-risk medication 10/17/2010   Preventative health care 10/15/2010   WEIGHT LOSS 04/18/2010   SHINGLES 08/22/2009   HYPERSOMNIA 04/12/2009   FREQUENCY, URINARY 04/12/2009   ABNORMAL TRANSAMINASE-LFT'S 08/04/2008   Personal history of colonic polyps 07/20/2008   Hypothyroidism 05/06/2007   Hyperlipidemia 05/06/2007   ERECTILE DYSFUNCTION 05/06/2007   Allergic rhinitis 05/06/2007   GERD 05/06/2007   OSTEOARTHRITIS, KNEE, 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   LOW BACK PAIN 05/06/2007   FATIGUE 05/06/2007   History of Present Illness:    at time of surgical consultation This is a 69 year old male with a past medical history of atrial fibrillation, COPD (remote tobacco abuse), hyperlipidemia, hypothyroidism, OSA right knee, restless leg syndrome and BPH who had complaints of worsening palpitations and shortness of breath. Patient states he has not had chest pain but has had jaw and ear pain. Some of his symptoms have occurred at rest. He denies diaphoresis, nausea, or syncope.    Per patient, this past September he had a dizzy spell after fixing his wife's brakes, became short of breath and "almost but did not pass out".  He felt his heart "racing". 911 was summonsed. He was told he was in atrial fibrillation and converted on his own in the ED. Of note, a myocardial perfusion scan was done on May 31,2017 and showed no ST segment deviation during stress, There is a medium defect of moderate severity present in the basal inferolateral, mid inferolateral and apical lateral location, and there is a small defect of moderate severity present in the mid inferior and apical inferior location. This was a  low risk study.    Because of continued/increased symptomatology, he underwent a cardiac/coronary CT coronary scan on 04/18/2020.  Results showed coronary calcium score 564, CAD-RADS 4 with diffuse moderate stenoses and a severe focal mixed  plaque in the mid LAD suspicious for stenosis > 70%, LCX is a small lumen non-dominant artery that has severe calcified plaque in the mid portion causing occlusion. Of note, patient had  jaw and ear pain on 11/11 and contacted cardiologist's office. Patient was called in a prescription for SL Nitro to take PRN and take ec asa;if pain worsens, he was instructed to call 911. Patient presented to [facility] on 04/25/2020 in order to undergo a cardiac catheterization. Cardiac catheterization done today (per brief cath note report) showed 60% ostial left main disease, proximal and mid LAD disease 50-60%, 80% ostial Diagonal 2, chronic,total occlusion of mid left Circumflex with right to left collaterals, 50% mid LRCA stenosis, and LVEF 45-50%. Echo done today showed LVEF 55-60% and no significant valvular disease. Dr. Prescott Gum has been consulted for consideration of coronary artery bypass grafting surgery.   The patient and his studies were evaluated by Dr. Darcey Nora who then recommended proceeding with coronary artery surgical revascularization as the best option due to the severity of the anatomical findings and the accelerating symptoms.  Discharged Condition: good  Hospital Course: Patient was medically stabilized and on 04/28/2020 he was taken the operating room where he underwent the below described procedure.  He tolerated well and was taken to the surgical intensive care unit in stable condition.   Postoperative hospital course:  The patient was extubated using standard protocols without difficulty. He initially did require some hemodynamic support with milrinone and Neo-Synephrine but this was able to be weaned without difficulty.  He is noted to have a mild expected acute blood loss anemia but has not required transfusion.  Renal function has remained within normal limits.  He does have some postoperative volume overload which is managed in a routine manner with diuretics.  On postoperative day #1 he  has noted to have good saturations on 4 L nasal cannula.  This will be weaned over time and he will undergo routine cardiac rehab modalities as well as aggressive pulmonary toilet.  Addendum:  The patient was stated prophylactically on Amiodarone for history of Atrial fibrillation and his MAZE procedure.  His chest tubes were removed on 05/01/2020.  He was felt medically stable for transfer to the progressive care unit.  He converted in and out of Atrial Fibrillation with elevated heart rates at times.  His lopressor dose was titrated.  He was started on an Amiodarone drip, however the patients heart rhythm persisted.  The patient developed persistent nausea and vomiting.  He was treated with anti-emetics but didn't obtain full relief.  It was felt this could be due to Amiodarone infusion.  The medication was discontinued and he was started on a Cardizem drip for this.  He continued to have Atrial Fibrillation but his HR was under better control.  Digoxin was also added to help maintain HR control.  The patient was started on coumadin for stroke prophylaxis.  His pacing wires were removed without difficulty.  The patient's nausea and vomiting persisted.  KUB was obtained and was concerned for ileus vs possible SBO.  Repeat film was slightly worsened.  Due to patients persistent symptoms the patient underwent CT of abdomen and pelvis.  This showed no evidence of ileus or obstruction.  There was evidence of enteritis.  Unfortunately patient symptoms persisted despite supportive measures.  The patient continued to complain of abdominal pain, nausea, vomiting and diarrhea at times.  He developed abdominal distention and GI consult was placed.  They evaluated patient and recommended continued supportive care.  They also recommended bowel regimen as constipation is likely contributing to the patient's symptoms.  He remained on stool softners and was also treated with an enema.  The patient's vomiting subsided.  He has  been able to move his bowels.  Indigestion persisted and he was treated with TUMS and simethicone.  His white blood count has normalized.  His hemoglobin remains stable.  He is now able to tolerate a diet without further vomiting.  GI has signed off but does recommend daily to twice daily use of Miralax to prevent constipation.  The patient has been maintaining NSR.  His blood pressure remains low but we have been able to decrease Midodrine to 5 mg TID and can hopefully discontinue in the next few weeks  He remains on coumadin.  His INR is 3.2 and he will be discharged home on 2 mg of coumadin.  His PT/INR will need check within 48 hours of discharge.  His PT/INR results will need to be faxed to [facility], Attn: Pharmacist at [phone].  He is ambulating independently.  His surgical incisions are healing without evidence of infection.  He is medically stable for discharge home today.  Consults: GI  Significant Diagnostic Studies:  Intravascular Ultrasound/IVUS  LEFT HEART CATH AND CORONARY ANGIOGRAPHY  Conclusion  Conclusions: Multivessel coronary artery disease, including focal, eccentric 60% ostial LMCA lesion (MLA 5.96 mm^2 by IVUS), serial 40-50% proximal through distal LAD stenoses, 80% ostial D2 stenosis, 50% proximal and 100% mid LCx stenoses with right-to-left collateral supplying the distal OM branches, and 50% mid RCA disease. Mildly reduced left ventricular systolic function (assessment limited by atrial fibrillation with rapid ventricular response).  LVEF ~ 45-50% with global hypokinesis and normal filling pressure.   Recommendations: Given multivessel coronary artery disease that was significant in the LAD and LCx territories on recent cardiac CTA as well as focal ostial LMCA stenosis that is significant by IVUS criteria, I recommend cardiac surgery consultation for CABG.  The LMCA lesion is treatable from a percutaneous approach.  However, the multisegmental LAD disease involving D2 and  CTO of the LCx are less favorable for stenting. Start heparin infusion 2 hours after removal of TR band. Aggressive secondary prevention of coronary artery disease.   Nelva Bush, MD [facility]   Recommendations  Antiplatelet/Anticoag Aspirin 81 mg daily.  Long term, the patient will need anticoagulation with paroxysmal atrial fibrillation.  I will start a heparin infusion 2 hours after TR band has been removed.  Discharge Date Anticipated discharge date to be determined.  Surgeon Notes    04/29/2020  7:49 AM Operative Note signed by Ivin Poot, MD    04/28/2020  6:24 PM Brief Op Note signed by Ivin Poot, MD    04/28/2020  3:28 PM Operative Note - Scan signed by Default, Provider, MD    04/25/2020  8:54 AM Brief Op Note signed by Nelva Bush, MD  Indications  Unstable angina ([place]) [I20.0 (ICD-10-CM)]  Abnormal cardiac CT angiography [R93.1 (ICD-10-CM)]  Procedural Details  Technical Details Indication: 69 y.o. year-old man with history of hyperlipidemia and recently diagnosed paroxysmal atrial fibrillation, presenting for evaluation of near syncope and frequent shortness of breath/jaw pain at rest concerning for unstable angina.  Cardiac CTA showed significant mid  LAD disease as well as chronic total occlusion of the LCx.  GFR: >60 ml/min  Procedure: The risks, benefits, complications, treatment options, and expected outcomes were discussed with the patient. The patient and/or family concurred with the proposed plan, giving informed consent. The patient was sedated with IV midazolam and fentanyl. The right wrist was assessed with a modified Allens test which was normal. The right wrist was prepped and draped in a sterile fashion. 1% lidocaine was used for local anesthesia. Using the modified Seldinger access technique, a 42F slender Glidesheath was placed in the right radial artery. 3 mg Verapamil was given through the sheath. Heparin 4,000 units were administered.   Selective coronary angiography was performed using a 9F JL3.5 catheter to engage the left coronary artery and a 9F JR4 catheter to engage the right coronary artery. Pressure dampening and ventricularization was noted with selective engagement of the left coronary artery.  Intracoronary nitroglycerin was administer into the left coronary artery.  Left heart catheterization was performed using a 9F angled pigtail catheter. Left ventriculogram was performed with a power injection of contrast.  IVUS of LMCA: Heparin was used for anticoagulation.  The left coronary artery was non-selectively engaged with a 9F EBU3.5 guide catheter and a Runthrough wire advanced into the distal LAD.  An OptiCross catheter was advanced into the proximal LAD and intravascular ultrasound of the LMCA performed.  This revealed severe, heterogenous plaque involving the ostial LMCA with a minimal luminal area of 5.96 mm^2.  At the end of the procedure, the radial artery sheath was removed and a TR band applied to achieve patent hemostasis. There were no immediate complications. The patient was taken to the recovery area in stable condition.  Estimated blood loss <50 mL.   During this procedure medications were administered to achieve and maintain moderate conscious sedation while the patient's heart rate, blood pressure, and oxygen saturation were continuously monitored and I was present face-to-face 100% of this time.  Medications (Filter: Administrations occurring from 0725 to 0845 on 04/25/20)  (important)  Continuous medications are totaled by the amount administered until 04/25/20 0845.  fentaNYL (SUBLIMAZE) injection (mcg) Total dose:  25 mcg  Date/Time  Rate/Dose/Volume Action  04/25/20 0740  25 mcg Given    midazolam (VERSED) injection (mg) Total dose:  1 mg  Date/Time  Rate/Dose/Volume Action  04/25/20 0740  1 mg Given    lidocaine (PF) (XYLOCAINE) 1 % injection (mL) Total volume:  2 mL  Date/Time   Rate/Dose/Volume Action  04/25/20 0740  2 mL Given    Radial Cocktail/Verapamil only (mL) Total volume:  10 mL  Date/Time  Rate/Dose/Volume Action  04/25/20 0743  10 mL Given    heparin sodium (porcine) injection (Units) Total dose:  9,000 Units  Date/Time  Rate/Dose/Volume Action  04/25/20 0743  4,000 Units Given  0806  5,000 Units Given    Heparin (Porcine) in NaCl 1000-0.9 UT/500ML-% SOLN (mL) Total volume:  1,000 mL  Date/Time  Rate/Dose/Volume Action  04/25/20 0745  500 mL Given  0745  500 mL Given    nitroGLYCERIN 1 mg/10 mL (100 mcg/mL) - IR/CATH LAB (mcg) Total dose:  200 mcg  Date/Time  Rate/Dose/Volume Action  04/25/20 0754  200 mcg Given    iohexol (OMNIPAQUE) 350 MG/ML injection (mL) Total volume:  130 mL  Date/Time  Rate/Dose/Volume Action  04/25/20 0831  130 mL Given    Sedation Time  Sedation Time Physician-1: 50 minutes 25 seconds  Contrast  Medication Name Total Dose  iohexol (OMNIPAQUE) 350 MG/ML injection 130 mL    Radiation/Fluoro  Fluoro time: 9.2 (min) DAP: 58099 (mGycm2) Cumulative Air Kerma: 833 (mGy)  Complications  Complications documented before study signed (04/25/2020  8:25 PM)   No complications were associated with this study.  Documented by Nelva Bush, MD - 04/25/2020  1:42 PM    Coronary Findings  Diagnostic Dominance: Right Left Main  Vessel is large.  Ost LM lesion is 60% stenosed. Ultrasound (IVUS) was performed. Cross-sectional area: 6 mm.  Left Anterior Descending  Vessel is large.  Prox LAD to Mid LAD lesion is 40% stenosed.  Mid LAD lesion is 50% stenosed. The lesion is mildly calcified.  Mid LAD to Dist LAD lesion is 50% stenosed.  First Diagonal Branch  Vessel is large in size.  Second Diagonal Branch  Vessel is moderate in size.  2nd Diag lesion is 80% stenosed.  Third Diagonal Branch  Vessel is small in size.  Left Circumflex  Vessel is moderate in size.  Prox Cx lesion is 50% stenosed.   Prox Cx to Mid Cx lesion is 100% stenosed. The lesion is chronically occluded with right-to-left collateral flow.  Second Obtuse Marginal Branch  Vessel is small in size.  Third Obtuse Marginal Branch  Vessel is moderate in size.  Collaterals  3rd Mrg filled by collaterals from 3rd RPL.    Right Coronary Artery  Vessel is large.  Mid RCA lesion is 50% stenosed.  Right Posterior Descending Artery  Vessel is moderate in size.  Right Posterior Atrioventricular Artery  Vessel is large in size.  Intervention  No interventions have been documented. Wall Motion  Resting               Left Heart  Left Ventricle The left ventricular size is normal. There is mild left ventricular systolic dysfunction. LV end diastolic pressure is normal. LVEDP 5-10 mmHg. The left ventricular ejection fraction is 45-50% by visual estimate. There are LV function abnormalities due to global hypokinesis.  Aortic Valve There is no aortic valve stenosis.  Coronary Diagrams  Diagnostic Dominance: Right  Intervention   Treatments: surgery:  OPERATIVE REPORT   DATE OF PROCEDURE:  04/28/2020   OPERATION: 1.  Coronary artery bypass grafting x4 (left internal mammary artery to left anterior descending, saphenous vein graft to diagonal, saphenous vein graft to obtuse marginal 1, saphenous vein graft to right coronary artery). 2.  Maze procedure of left atrium with bilateral pulmonary vein isolation using radiofrequency ablation. 3.  Placement of 40 mm left atrial clip on the left atrial appendage. 4.  Endoscopic harvest of bilateral greater saphenous vein.   SURGEON:  Len Childs, MD   ASSISTANT:  Jadene Pierini, PA-C   ANESTHESIA:  General. Discharge Exam: Blood pressure 102/64, pulse 74, temperature 98 F (36.7 C), temperature source Oral, resp. rate 15, height 5\' 8"  (1.727 m), weight 80.9 kg, SpO2 94 %.  General appearance: alert, cooperative and no distress Heart: regular rate and rhythm  Lungs: clear to auscultation bilaterally Abdomen: soft, non-tender; bowel sounds normal; no masses,  no organomegaly Extremities: extremities normal, atraumatic, no cyanosis or edema Wound: clean and dry  Discharge disposition: 01-Home or Self Care    Discharge Instructions     AMB Referral to Cardiac Rehabilitation - Phase II   Complete by: As directed    Diagnosis: CABG   CABG X ___: 4   After initial evaluation and assessments completed: Virtual Based Care may be provided alone or in  conjunction with Phase 2 Cardiac Rehab based on patient barriers.: Yes      Allergies as of 05/11/2020       Reactions   Atorvastatin    myalgia   Claritin [loratadine]    dizzy   Rofecoxib Itching   Rosuvastatin    myalgia   Zocor [simvastatin] Other (See Comments)   myalgia        Medication List     STOP taking these medications    naproxen sodium 220 MG tablet Commonly known as: ALEVE       TAKE these medications    albuterol 108 (90 Base) MCG/ACT inhaler Commonly known as: VENTOLIN HFA Inhale 2 puffs into the lungs every 6 (six) hours as needed for wheezing or shortness of breath.   ascorbic acid 500 MG tablet Commonly known as: VITAMIN C Take 500 mg by mouth daily.   aspirin EC 81 MG tablet Take 81 mg by mouth daily. Swallow whole.   dicyclomine 10 MG capsule Commonly known as: BENTYL Take 1 capsule (10 mg total) by mouth 4 (four) times daily.   digoxin 0.125 MG tablet Commonly known as: LANOXIN Take 1 tablet (0.125 mg total) by mouth daily. Start taking on: May 12, 2020   diltiazem 120 MG 12 hr capsule Commonly known as: CARDIZEM SR Take 1 capsule (120 mg total) by mouth every 12 (twelve) hours.   ezetimibe 10 MG tablet Commonly known as: ZETIA Take 10 mg by mouth daily.   hydrOXYzine 25 MG tablet Commonly known as: ATARAX/VISTARIL Take 1 tablet (25 mg total) by mouth 3 (three) times daily as needed for anxiety, nausea or vomiting.    levothyroxine 150 MCG tablet Commonly known as: Euthyrox Take 1 tablet (150 mcg total) by mouth daily.   meclizine 12.5 MG tablet Commonly known as: ANTIVERT Take 1 tablet (12.5 mg total) by mouth 3 (three) times daily as needed for dizziness.   metoprolol tartrate 25 MG tablet Commonly known as: LOPRESSOR Take 0.5 tablets (12.5 mg total) by mouth 2 (two) times daily.   midodrine 5 MG tablet Commonly known as: PROAMATINE Take 1 tablet (5 mg total) by mouth 3 (three) times daily with meals.   nitroGLYCERIN 0.4 MG SL tablet Commonly known as: NITROSTAT Place 1 tablet (0.4 mg total) under the tongue every 5 (five) minutes as needed for chest pain.   polyethylene glycol 17 g packet Commonly known as: MIRALAX / GLYCOLAX Take 17 g by mouth daily. Start taking on: May 12, 2020   ROLAIDS PO Take 1 tablet by mouth 3 (three) times daily as needed (heartburn).   Turmeric Curcumin 500 MG Caps Take 1,000 mg by mouth daily.   warfarin 1 MG tablet Commonly known as: COUMADIN Take 1 tablet (1 mg total) by mouth daily at 4 PM.        Follow-up Information     Minus Breeding, MD Follow up.   Specialty: Cardiology Why: Please see discharge paperwork for details on post discharge follow-up appointment with cardiologist. Contact information: [address] [place] [address] [phone]         Ivin Poot, MD Follow up.   Specialty: Cardiothoracic Surgery Why: Please see discharge paperwork for follow-up appointment with your surgeon.  Also obtain a chest x-ray at [place] 1/2-hour prior to appointment.  It is located in the same office complex on the first floor.   Contact information: [place] [address] [phone]  [place] [place] Follow up.   Specialty: Home Health Services Why: For home health services, they will call you in 1-2 days to schedule your first home appointment.  Please draw PT/INR (on Coumadin for a fib) 48 hours after discharge date and call or fax results to (ph [phone], fax [phone]). Contact information: [place] [address] [phone]                The patient has been discharged on:   1.Beta Blocker:  Yes [ X  ]                              No   [   ]                              If No, reason:  2.Ace Inhibitor/ARB: Yes Valu.[place]ieves   ]                                     No  [    ]                                     If No, reason:  3.Statin:   Yes [  X ]                  No  [   ]                  If No, reason:  4.Ecasa:  Yes  [ X  ]                  No   [   ]                  If No, reason:  Signed: Erin Barrett, PA-C 05/11/2020, 9:43 AM   patient examined and medical record reviewed,agree with above note. Tharon Aquas Trigt III 05/11/2020

## 2020-04-29 NOTE — Op Note (Signed)
NAME: ABDULLAHI, Guy Morris MEDICAL RECORD PY:[id number] ACCOUNT [id number] DATE OF BIRTH:1950/12/14 FACILITY: [facility] LOCATION: [facility] PHYSICIAN:Majestic Brister VAN TRIGT III, MD  OPERATIVE REPORT  DATE OF PROCEDURE:  04/28/2020  OPERATION: 1.  Coronary artery bypass grafting x4 (left internal mammary artery to left anterior descending, saphenous vein graft to diagonal, saphenous vein graft to obtuse marginal 1, saphenous vein graft to right coronary artery). 2.  Maze procedure of left atrium with bilateral pulmonary vein isolation using radiofrequency ablation. 3.  Placement of 40 mm left atrial clip on the left atrial appendage. 4.  Endoscopic harvest of bilateral greater saphenous vein.  SURGEON:  Len Childs, MD  ASSISTANT:  Jadene Pierini, PA-C  ANESTHESIA:  General.  PREOPERATIVE DIAGNOSES:  Paroxysmal atrial fibrillation, severe 3-vessel coronary artery disease, unstable angina.  POSTOPERATIVE DIAGNOSES:  Paroxysmal atrial fibrillation, severe 3-vessel coronary artery disease, unstable angina.  CLINICAL NOTE:  The patient is a 68 year old reformed smoker who presented with chest pains, positive cardiac enzymes and runs of rapid atrial fibrillation.  He was admitted to the hospital and stabilized.  He underwent cardiac catheterization which  showed severe multivessel coronary disease with fairly well-preserved LV function with EF of 50-55%.  Echocardiogram showed no significant valvular disease.  Because of high-grade RCA stenosis and moderate to severe LAD stenosis and recurrent episodes of  symptomatic atrial fibrillation, he was felt to be a candidate for multivessel CABG with combined left atrial maze procedure.  I discussed the procedure in detail with the patient and his family and informed consent was obtained.  He understood the  details of surgery to include general anesthesia and cardiopulmonary bypass, the location of the surgical incisions, and the expected postoperative hospital  recovery.  We discussed the potential risks to him of the surgery including the risks of stroke,  bleeding, infection, organ failure, postoperative pulmonary problems including pleural effusion, recurrent atrial fibrillation, the need for permanent pacemaker, and death.  He demonstrated his understanding of these issues and agreed to proceed with  surgery under what I felt was an informed consent.  OPERATIVE FINDINGS: 1.  Diffuse coronary disease with adequate target in the right coronary circulation, but small vessels in the LAD and circumflex circulations. 2.  Adequate conduit. 3.  No packed cell transfusion was needed for the surgery. 4.  Preservation of LV function after separation from cardiopulmonary bypass in sinus rhythm.  DESCRIPTION OF PROCEDURE:  The patient was brought from preoperative holding where informed consent was obtained and final issues were addressed with the patient.  He was placed supine on the operating table and general anesthesia was induced.  He  remained stable.  A transesophageal echo probe was placed by the anesthesia team.  The patient was prepped and draped as a sterile field.  A proper timeout was performed.  A sternal incision was made.  The left internal mammary artery was harvested as a pedicle graft.  It was a 1.5 mm vessel.  During this period of time, the saphenous  vein was harvested first from the right leg and then from the left leg using endoscopic technique.  The pericardium was suspended and pursestrings were placed in the ascending aorta and right atrium.  Heparin was administered and the patient was cannulated and placed on bypass.  The coronaries were identified for grafting.  Cardioplegia cannulas were  placed, both antegrade aortic and retrograde coronary sinus catheter cardioplegia.  The patient was cooled to 32 degrees.  The aortic crossclamp was applied.  One liter of  cold blood cardioplegia was delivered in split doses between the antegrade  aortic and retrograde coronary sinus catheter.  There was good cardioplegic arrest and  supple temperature dropped less than 12 degrees.  Cardioplegia was delivered every 20 minutes.  First, the maze procedure was performed with bilateral pulmonary vein isolation.  The heart was retracted to the right side to expose the left-sided pulmonary veins.  The ligament of Ruthann Cancer was taken down and a vessel loop was placed around the base of  the left-sided pulmonary veins.  The radiofrequency ablation clamp was then placed and 2 ablation lines were performed at the junction between the left-sided pulmonary veins and left atrium.  The ablation clamp was then placed at the base of the left atrial appendage and 2 ablation applications were performed at that point.  Next, the right-sided pulmonary veins were carefully dissected from the right pulmonary artery and the pericardial reflection and a vessel loop placed around the junction of the right-sided pulmonary veins and the left atrium.  The radiofrequency  ablation clamp was then placed at the junction of the right-sided pulmonary veins and the left atrium and 2 ablation lines were performed.  Cardioplegia was redosed.  Next, the distal coronary anastomoses were performed.  The first distal anastomosis was to the posterior descending branch of right coronary.  There was a high-grade 90% proximal stenosis.  Reverse saphenous vein was sewn end-to-side with running 7-0  Prolene with good flow through the graft.  Cardioplegia was redosed.  The second distal anastomosis was the OM branch of the left coronary.  This was a smaller chronically occluded vessel, 1.3 mm.  A reverse saphenous vein was sewn end-to-side with running 7-0 Prolene with good flow through the graft.  Cardioplegia was  redosed.  The third distal anastomosis was to the second diagonal branch of LAD.  There was a smaller 1.3 mm vessel with proximal 80% stenosis.  Reverse saphenous vein was  sewn end-to-side with running 7-0 Prolene with adequate flow through the graft.   Cardioplegia was redosed.  Next the left atrial appendage was closed with a 4 cm Atricure clip  The final distal anastomosis was to the distal third of the LAD.  There was a proximal 80% stenosis.  The left IMA pedicle was brought through an opening in the left lateral pericardium and was brought down onto the LAD and sewn end-to-side with running  8-0 Prolene.  There was good flow through the anastomosis after briefly releasing the mammary pedicle.  Hemostasis was confirmed at the time of the mammary perfusion.  The pedicle was secured to the epicardium.  After cardioplegia was delivered again, the 3 proximal vein anastomoses were performed while the crossclamp was in place using a 4.5 mm punch and running 6-0 Prolene.  Prior to tying down the final proximal anastomosis, air was vented from the coronaries  with a dose of retrograde warm blood cardioplegia.  It was noted the wall of the aorta was thickened in areas.  After the crossclamp was removed, the heart resumed a spontaneous rhythm.  The vein grafts were deaired and opened.  Hemostasis was documented at the proximal and distal anastomoses.  The cardioplegia cannulas were removed.  The patient was rewarmed and  reperfused,  Low dose milrinone was started.  Temporary pacing wires were applied.  The lungs reexpanded.  The ventilator was resumed.  The patient was then weaned from cardiopulmonary bypass after adequate reperfusion and rewarming,  Echo showed preservation of good LV systolic  function.  The patient remained hemodynamically stable off  bypass.  Protamine was administered without adverse reaction.  There was persistent significant diffuse coagulopathy and bleeding.  The patient was then given FFP and a dose of DDAVP with improved coagulation function.  The superior pericardial fat was closed over the aorta and vein grafts.  The anterior mediastinum and  bilateral pleural tubes were placed and brought out through separate incisions.  The sternum was closed with interrupted steel wire.  The patient  remained stable.  The pectoralis fascia was closed with a running Vicryl.  The skin was closed with a subcuticular Vicryl.  A 40 mm AtriCure left atrial clip was applied at the base the left atrial appendage after the distal vein anastomoses were performed.  A chest x-ray was taken in the operating room which was clear and the patient was then transported back to the ICU in stable condition.  Total cardiopulmonary bypass time was 205 minutes.  HN/NUANCE  D:04/28/2020 T:04/29/2020 JOB:[id number]

## 2020-04-29 NOTE — Hospital Course (Signed)
  History of Present Illness:    at time of surgical consultation This is a 69 year old male with a past medical history of atrial fibrillation, COPD (remote tobacco abuse), hyperlipidemia, hypothyroidism, OSA right knee, restless leg syndrome and BPH who had complaints of worsening palpitations and shortness of breath. Patient states he has not had chest pain but has had jaw and ear pain. Some of his symptoms have occurred at rest. He denies diaphoresis, nausea, or syncope.    Per patient, this past September he had a dizzy spell after fixing his wife's brakes, became short of breath and "almost but did not pass out".  He felt his heart "racing". 911 was summonsed. He was told he was in atrial fibrillation and converted on his own in the ED. Of note, a myocardial perfusion scan was done on May 31,2017 and showed no ST segment deviation during stress, There is a medium defect of moderate severity present in the basal inferolateral, mid inferolateral and apical lateral location, and there is a small defect of moderate severity present in the mid inferior and apical inferior location. This was a low risk study.    Because of continued/increased symptomatology, he underwent a cardiac/coronary CT coronary scan on 04/18/2020.  Results showed coronary calcium score 564, CAD-RADS 4 with diffuse moderate stenoses and a severe focal mixed plaque in the mid LAD suspicious for stenosis > 70%, LCX is a small lumen non-dominant artery that has severe calcified plaque in the mid portion causing occlusion. Of note, patient had  jaw and ear pain on 11/11 and contacted cardiologist's office. Patient was called in a prescription for SL Nitro to take PRN and take ec asa;if pain worsens, he was instructed to call 911. Patient presented to [facility] on 04/25/2020 in order to undergo a cardiac catheterization. Cardiac catheterization done today (per brief cath note report) showed 60% ostial left main disease, proximal and mid LAD  disease 50-60%, 80% ostial Diagonal 2, chronic,total occlusion of mid left Circumflex with right to left collaterals, 50% mid LRCA stenosis, and LVEF 45-50%. Echo done today showed LVEF 55-60% and no significant valvular disease. Dr. Prescott Gum has been consulted for consideration of coronary artery bypass grafting surgery.   The patient and his studies were evaluated by Dr. Darcey Nora who then recommended proceeding with coronary artery surgical revascularization as the best option due to the severity of the anatomical findings and the accelerating symptoms.   Hospital Course: Patient was medically stabilized and on 04/28/2020 he was taken the operating room where he underwent the below described procedure.  He tolerated well and was taken to the surgical intensive care unit in stable condition.   Postoperative hospital course:  The patient was extubated using standard protocols without difficulty.He initially did require some hemodynamic support with milrinone and Neo-Synephrine but this was able to be weaned without difficulty.  He is noted to have a mild expected acute blood loss anemia but has not required transfusion.  Renal function has remained within normal limits.  He does have some postoperative volume overload which is managed in a routine manner with diuretics.  On postoperative day #1 he has noted to have good saturations on 4 L nasal cannula.  This will be weaned over time and he will undergo routine cardiac rehab modalities as well as aggressive pulmonary toilet.

## 2020-04-29 NOTE — Progress Notes (Addendum)
1 Day Post-Op Procedure(s) (LRB): CORONARY ARTERY BYPASS GRAFTING (CABG) TIMES FOUR USING LIMA to LAD; ENDOSCOPIC HARVESTED RIGHT GREATER SAPHENOUS VEIN AND MAZE PROCEDURE. (N/A) CLIPPING OF ATRIAL APPENDAGE USING ATRICUE 40 MM ATRICLIP EXCLUSION VLAA SYSTEM (Left) MAZE USING ATRICURE EMT1 ISOLATOR ABLATION SYSTEM (N/A) TRANSESOPHAGEAL ECHOCARDIOGRAM (TEE) (N/A) ENDOVEIN HARVEST OF GREATER SAPHENOUS VEIN (Bilateral) Subjective: Sinus rhythm but c/o chest soreness with incentive spirometer CI 2.5 Objective: Vital signs in last 24 hours: Temp:  [97 F (36.1 C)-98.8 F (37.1 C)] 98.8 F (37.1 C) (11/19 0700) Pulse Rate:  [88-99] 89 (11/19 0700) Cardiac Rhythm: Normal sinus rhythm (11/19 0800) Resp:  [12-32] 22 (11/19 0700) BP: (82-114)/(55-85) 108/75 (11/19 0700) SpO2:  [96 %-100 %] 98 % (11/19 0700) Arterial Line BP: (88-129)/(50-91) 129/62 (11/19 0700) FiO2 (%):  [36 %-50 %] 36 % (11/18 1833) Weight:  [84.4 kg] 84.4 kg (11/19 0600)  Hemodynamic parameters for last 24 hours: PAP: (17-50)/(9-25) 28/16 CO:  [4 L/min-6.3 L/min] 6.3 L/min CI:  [2.1 L/min/m2-3.3 L/min/m2] 3.3 L/min/m2  Intake/Output from previous day: 11/18 0701 - 11/19 0700 In: 4931.2 [I.V.:2601.3; Blood:805; IV Piggyback:1524.9] Out: 6599 [Urine:1885; Blood:650; Chest Tube:740] Intake/Output this shift: Total I/O In: 47.4 [I.V.:47.4] Out: 55 [Urine:35; Chest Tube:20]       Exam    General- alert and comfortable    Neck- no JVD, no cervical adenopathy palpable, no carotid bruit   Lungs- clear without rales, wheezes   Cor- regular rate and rhythm, no murmur , gallop   Abdomen- soft, non-tender   Extremities - warm, non-tender, minimal edema   Neuro- oriented, appropriate, no focal weakness   Lab Results: Recent Labs    04/28/20 2115 04/29/20 0422  WBC 12.8* 9.5  HGB 10.3* 9.7*  HCT 31.9* 29.5*  PLT 152 142*   BMET:  Recent Labs    04/28/20 2115 04/29/20 0422  NA 138 138  K 4.2 4.1  CL 109  108  CO2 20* 22  GLUCOSE 158* 144*  BUN 9 9  CREATININE 0.79 0.74  CALCIUM 7.7* 7.8*    PT/INR:  Recent Labs    04/28/20 1530  LABPROT 15.2  INR 1.3*   ABG    Component Value Date/Time   PHART 7.390 04/28/2020 2022   HCO3 20.6 04/28/2020 2022   TCO2 22 04/28/2020 2022   ACIDBASEDEF 4.0 (H) 04/28/2020 2022   O2SAT 68.9 04/29/2020 0423   CBG (last 3)  Recent Labs    04/29/20 0515 04/29/20 0615 04/29/20 0659  GLUCAP 139* 140* 140*    Assessment/Plan: S/P Procedure(s) (LRB): CORONARY ARTERY BYPASS GRAFTING (CABG) TIMES FOUR USING LIMA to LAD; ENDOSCOPIC HARVESTED RIGHT GREATER SAPHENOUS VEIN AND MAZE PROCEDURE. (N/A) CLIPPING OF ATRIAL APPENDAGE USING ATRICUE 40 MM ATRICLIP EXCLUSION VLAA SYSTEM (Left) MAZE USING ATRICURE EMT1 ISOLATOR ABLATION SYSTEM (N/A) TRANSESOPHAGEAL ECHOCARDIOGRAM (TEE) (N/A) ENDOVEIN HARVEST OF GREATER SAPHENOUS VEIN (Bilateral) Mobilize Diuresis Diabetes control See progression orders cont po amiodarone Will use coumadin later for afib/Maze  LOS: 4 days    Guy Morris 04/29/2020

## 2020-04-30 ENCOUNTER — Inpatient Hospital Stay (HOSPITAL_COMMUNITY): Payer: Medicare Other

## 2020-04-30 LAB — GLUCOSE, CAPILLARY
Glucose-Capillary: 111 mg/dL — ABNORMAL HIGH (ref 70–99)
Glucose-Capillary: 124 mg/dL — ABNORMAL HIGH (ref 70–99)
Glucose-Capillary: 83 mg/dL (ref 70–99)
Glucose-Capillary: 91 mg/dL (ref 70–99)
Glucose-Capillary: 91 mg/dL (ref 70–99)
Glucose-Capillary: 99 mg/dL (ref 70–99)

## 2020-04-30 LAB — BASIC METABOLIC PANEL
Anion gap: 5 (ref 5–15)
BUN: 15 mg/dL (ref 8–23)
CO2: 26 mmol/L (ref 22–32)
Calcium: 8.1 mg/dL — ABNORMAL LOW (ref 8.9–10.3)
Chloride: 103 mmol/L (ref 98–111)
Creatinine, Ser: 0.95 mg/dL (ref 0.61–1.24)
GFR, Estimated: >60 mL/min (ref >60)
Glucose, Bld: 115 mg/dL — ABNORMAL HIGH (ref 70–99)
Potassium: 4 mmol/L (ref 3.5–5.1)
Sodium: 134 mmol/L — ABNORMAL LOW (ref 135–145)

## 2020-04-30 LAB — CBC
HCT: 26.3 % — ABNORMAL LOW (ref 39.0–52.0)
Hemoglobin: 8.6 g/dL — ABNORMAL LOW (ref 13.0–17.0)
MCH: 32.1 pg (ref 26.0–34.0)
MCHC: 32.7 g/dL (ref 30.0–36.0)
MCV: 98.1 fL (ref 80.0–100.0)
Platelets: 145 10*3/uL — ABNORMAL LOW (ref 150–400)
RBC: 2.68 MIL/uL — ABNORMAL LOW (ref 4.22–5.81)
RDW: 12.4 % (ref 11.5–15.5)
WBC: 9.5 10*3/uL (ref 4.0–10.5)
nRBC: 0 % (ref 0.0–0.2)

## 2020-04-30 LAB — COOXEMETRY PANEL
Carboxyhemoglobin: 1.2 % (ref 0.5–1.5)
Carboxyhemoglobin: 1.4 % (ref 0.5–1.5)
Methemoglobin: 0.9 % (ref 0.0–1.5)
Methemoglobin: 0.8 % (ref 0.0–1.5)
O2 Saturation: 48.8 %
O2 Saturation: 60.6 %
Total hemoglobin: 8.8 g/dL — ABNORMAL LOW (ref 12.0–16.0)
Total hemoglobin: 9.6 g/dL — ABNORMAL LOW (ref 12.0–16.0)

## 2020-04-30 NOTE — Progress Notes (Signed)
      [address]       [place] [address]             [phone]                 2 Days Post-Op Procedure(s) (LRB): CORONARY ARTERY BYPASS GRAFTING (CABG) TIMES FOUR USING LIMA to LAD; ENDOSCOPIC HARVESTED RIGHT GREATER SAPHENOUS VEIN AND MAZE PROCEDURE. (N/A) CLIPPING OF ATRIAL APPENDAGE USING ATRICUE 40 MM ATRICLIP EXCLUSION VLAA SYSTEM (Left) MAZE USING ATRICURE EMT1 ISOLATOR ABLATION SYSTEM (N/A) TRANSESOPHAGEAL ECHOCARDIOGRAM (TEE) (N/A) ENDOVEIN HARVEST OF GREATER SAPHENOUS VEIN (Bilateral)   Events: No events Ambulated this am _______________________________________________________________ Vitals: BP 100/72   Pulse 86   Temp 98.6 F (37 C)   Resp (!) 22   Ht 5\' 8"  (1.727 m)   Wt 83.4 kg   SpO2 97%   BMI 27.96 kg/m   - Neuro: alert NAD  - Cardiovascular: sinus  Drips: milr 0.125.      - Pulm: EWOB    ABG    Component Value Date/Time   PHART 7.390 04/28/2020 2022   PCO2ART 34.0 04/28/2020 2022   PO2ART 88 04/28/2020 2022   HCO3 20.6 04/28/2020 2022   TCO2 22 04/28/2020 2022   ACIDBASEDEF 4.0 (H) 04/28/2020 2022   O2SAT 60.6 04/30/2020 1000    - Abd: soft - Extremity: warm  .Intake/Output      11/19 0701 - 11/20 0700 11/20 0701 - 11/21 0700   P.O. 510    I.V. (mL/kg) 579.8 (7)    Blood     IV Piggyback 250    Total Intake(mL/kg) 1339.8 (16.1)    Urine (mL/kg/hr) 1345 (0.7)    Blood     Chest Tube 345    Total Output 1690    Net -350.2            _______________________________________________________________ Labs: CBC Latest Ref Rng & Units 04/30/2020 04/29/2020 04/29/2020  WBC 4.0 - 10.5 K/uL 9.5 11.5(H) 9.5  Hemoglobin 13.0 - 17.0 g/dL 8.6(L) 10.0(L) 9.7(L)  Hematocrit 39 - 52 % 26.3(L) 30.7(L) 29.5(L)  Platelets 150 - 400 K/uL 145(L) 166 142(L)   CMP Latest Ref Rng & Units 04/30/2020 04/29/2020 04/29/2020  Glucose 70 - 99 mg/dL 115(H) 157(H) 144(H)  BUN 8 - 23 mg/dL 15 10 9   Creatinine 0.61 - 1.24 mg/dL 0.95  0.86 0.74  Sodium 135 - 145 mmol/L 134(L) 134(L) 138  Potassium 3.5 - 5.1 mmol/L 4.0 3.7 4.1  Chloride 98 - 111 mmol/L 103 103 108  CO2 22 - 32 mmol/L 26 22 22   Calcium 8.9 - 10.3 mg/dL 8.1(L) 8.3(L) 7.8(L)  Total Protein 6.5 - 8.1 g/dL - - -  Total Bilirubin 0.3 - 1.2 mg/dL - - -  Alkaline Phos 38 - 126 U/L - - -  AST 15 - 41 U/L - - -  ALT 0 - 44 U/L - - -    CXR: stable  _______________________________________________________________  Assessment and Plan: POD 1 s/p CABG, maze/atriclip  Neuro: pain controlled CV: remains in sinus, on amio.  Will wean milr. Pulm: continue pulm toilet.  Likely will remove CT later today or tomorrow Renal: on room air.  Will hold diuresis given bp today GI: on diet Heme: stable.  Will hold off on coumadin for now ID: afebrile Endo: SSI Dispo: continue ICU care.  Floor soon.  Melodie Bouillon, MD 04/30/2020 10:26 AM

## 2020-05-01 LAB — TYPE AND SCREEN
ABO/RH(D): A POS
Antibody Screen: NEGATIVE
Unit division: 0
Unit division: 0
Unit division: 0
Unit division: 0

## 2020-05-01 LAB — BPAM RBC
Blood Product Expiration Date: 202112142359
Blood Product Expiration Date: 202112142359
Blood Product Expiration Date: 202112142359
Blood Product Expiration Date: 202112142359
Unit Type and Rh: 6200
Unit Type and Rh: 6200
Unit Type and Rh: 6200
Unit Type and Rh: 6200

## 2020-05-01 LAB — BASIC METABOLIC PANEL
Anion gap: 8 (ref 5–15)
BUN: 13 mg/dL (ref 8–23)
CO2: 29 mmol/L (ref 22–32)
Calcium: 8.7 mg/dL — ABNORMAL LOW (ref 8.9–10.3)
Chloride: 98 mmol/L (ref 98–111)
Creatinine, Ser: 0.85 mg/dL (ref 0.61–1.24)
GFR, Estimated: >60 mL/min (ref >60)
Glucose, Bld: 105 mg/dL — ABNORMAL HIGH (ref 70–99)
Potassium: 3.8 mmol/L (ref 3.5–5.1)
Sodium: 135 mmol/L (ref 135–145)

## 2020-05-01 LAB — COOXEMETRY PANEL
Carboxyhemoglobin: 1.2 % (ref 0.5–1.5)
Carboxyhemoglobin: 1.1 % (ref 0.5–1.5)
Carboxyhemoglobin: 1.2 % (ref 0.5–1.5)
Methemoglobin: 0.8 % (ref 0.0–1.5)
Methemoglobin: 0.8 % (ref 0.0–1.5)
Methemoglobin: 0.7 % (ref 0.0–1.5)
O2 Saturation: 49.7 %
O2 Saturation: 40.1 %
O2 Saturation: 46.7 %
Total hemoglobin: 10.1 g/dL — ABNORMAL LOW (ref 12.0–16.0)
Total hemoglobin: 10 g/dL — ABNORMAL LOW (ref 12.0–16.0)
Total hemoglobin: 10.7 g/dL — ABNORMAL LOW (ref 12.0–16.0)

## 2020-05-01 LAB — GLUCOSE, CAPILLARY
Glucose-Capillary: 97 mg/dL (ref 70–99)
Glucose-Capillary: 134 mg/dL — ABNORMAL HIGH (ref 70–99)
Glucose-Capillary: 97 mg/dL (ref 70–99)
Glucose-Capillary: 100 mg/dL — ABNORMAL HIGH (ref 70–99)
Glucose-Capillary: 118 mg/dL — ABNORMAL HIGH (ref 70–99)

## 2020-05-01 LAB — CBC
HCT: 29.5 % — ABNORMAL LOW (ref 39.0–52.0)
Hemoglobin: 9.6 g/dL — ABNORMAL LOW (ref 13.0–17.0)
MCH: 31.1 pg (ref 26.0–34.0)
MCHC: 32.5 g/dL (ref 30.0–36.0)
MCV: 95.5 fL (ref 80.0–100.0)
Platelets: 195 10*3/uL (ref 150–400)
RBC: 3.09 MIL/uL — ABNORMAL LOW (ref 4.22–5.81)
RDW: 12.3 % (ref 11.5–15.5)
WBC: 9.8 10*3/uL (ref 4.0–10.5)
nRBC: 0 % (ref 0.0–0.2)

## 2020-05-01 MED ORDER — SODIUM CHLORIDE 0.9% FLUSH
3.0000 mL | Freq: Two times a day (BID) | INTRAVENOUS | Status: DC
  Administered 2020-05-01: 3 mL via INTRAVENOUS

## 2020-05-01 MED ORDER — ~~LOC~~ CARDIAC SURGERY, PATIENT & FAMILY EDUCATION: Freq: Once | Status: AC

## 2020-05-01 MED ORDER — SODIUM CHLORIDE 0.9 % IV SOLN: 250.0000 mL | INTRAVENOUS | Status: DC | PRN

## 2020-05-01 MED ORDER — MIDODRINE HCL 5 MG PO TABS
10.0000 mg | ORAL_TABLET | Freq: Three times a day (TID) | ORAL | Status: DC
  Administered 2020-05-01 – 2020-05-08 (×23): 10 mg via ORAL
  Filled 2020-05-01 (×23): qty 2

## 2020-05-01 MED ORDER — SODIUM CHLORIDE 0.9% FLUSH: 3.0000 mL | INTRAVENOUS | Status: DC | PRN

## 2020-05-01 NOTE — Progress Notes (Signed)
Arrived from [facility] via [place].  Telemetry box connected.  CCMD notified.  CHG completed.  Oriented to room.  Vital signs taken. Noted afib120-140's.  Non-sustained. Pt stated when he gets up and his heart rate goes up there.  Noted rate in 80's within minutes.

## 2020-05-01 NOTE — Progress Notes (Signed)
      [address]       [place] [address]             [phone]                 3 Days Post-Op Procedure(s) (LRB): CORONARY ARTERY BYPASS GRAFTING (CABG) TIMES FOUR USING LIMA to LAD; ENDOSCOPIC HARVESTED RIGHT GREATER SAPHENOUS VEIN AND MAZE PROCEDURE. (N/A) CLIPPING OF ATRIAL APPENDAGE USING ATRICUE 40 MM ATRICLIP EXCLUSION VLAA SYSTEM (Left) MAZE USING ATRICURE EMT1 ISOLATOR ABLATION SYSTEM (N/A) TRANSESOPHAGEAL ECHOCARDIOGRAM (TEE) (N/A) ENDOVEIN HARVEST OF GREATER SAPHENOUS VEIN (Bilateral)   Events: In and out of afib today Ambulated this am _______________________________________________________________ Vitals: BP 90/71   Pulse 85   Temp 98.6 F (37 C)   Resp 20   Ht 5\' 8"  (1.727 m)   Wt 81.8 kg   SpO2 91%   BMI 27.42 kg/m   - Neuro: alert NAD  - Cardiovascular: sinus  Drips: milr 0.125.      - Pulm: EWOB    ABG    Component Value Date/Time   PHART 7.390 04/28/2020 2022   PCO2ART 34.0 04/28/2020 2022   PO2ART 88 04/28/2020 2022   HCO3 20.6 04/28/2020 2022   TCO2 22 04/28/2020 2022   ACIDBASEDEF 4.0 (H) 04/28/2020 2022   O2SAT 46.7 05/01/2020 0840    - Abd: soft - Extremity: warm  .Intake/Output      11/20 0701 - 11/21 0700 11/21 0701 - 11/22 0700   P.O.     I.V. (mL/kg) 519.5 (6.4) 5.9 (0.1)   IV Piggyback     Total Intake(mL/kg) 519.5 (6.4) 5.9 (0.1)   Urine (mL/kg/hr) 4650 (2.4) 460 (1.6)   Chest Tube 150    Total Output 4800 460   Net -4280.5 -454.1           _______________________________________________________________ Labs: CBC Latest Ref Rng & Units 05/01/2020 04/30/2020 04/29/2020  WBC 4.0 - 10.5 K/uL 9.8 9.5 11.5(H)  Hemoglobin 13.0 - 17.0 g/dL 9.6(L) 8.6(L) 10.0(L)  Hematocrit 39 - 52 % 29.5(L) 26.3(L) 30.7(L)  Platelets 150 - 400 K/uL 195 145(L) 166   CMP Latest Ref Rng & Units 05/01/2020 04/30/2020 04/29/2020  Glucose 70 - 99 mg/dL 105(H) 115(H) 157(H)  BUN 8 - 23 mg/dL 13 15 10   Creatinine 0.61  - 1.24 mg/dL 0.85 0.95 0.86  Sodium 135 - 145 mmol/L 135 134(L) 134(L)  Potassium 3.5 - 5.1 mmol/L 3.8 4.0 3.7  Chloride 98 - 111 mmol/L 98 103 103  CO2 22 - 32 mmol/L 29 26 22   Calcium 8.9 - 10.3 mg/dL 8.7(L) 8.1(L) 8.3(L)  Total Protein 6.5 - 8.1 g/dL - - -  Total Bilirubin 0.3 - 1.2 mg/dL - - -  Alkaline Phos 38 - 126 U/L - - -  AST 15 - 41 U/L - - -  ALT 0 - 44 U/L - - -    CXR: stable  _______________________________________________________________  Assessment and Plan: POD 2 s/p CABG, maze/atriclip  Neuro: pain controlled CV: remains in sinus, on amio.  Off milr today Pulm: continue pulm toilet.  CT out today Renal: on room air.  Will hold diuresis given bp today GI: on diet Heme: stable.  Will hold off on coumadin for now ID: afebrile Endo: SSI Dispo: floor today  Melodie Bouillon, MD 05/01/2020 10:28 AM

## 2020-05-02 ENCOUNTER — Inpatient Hospital Stay: Payer: Self-pay

## 2020-05-02 ENCOUNTER — Inpatient Hospital Stay (HOSPITAL_COMMUNITY): Payer: Medicare Other

## 2020-05-02 LAB — CBC
HCT: 33.3 % — ABNORMAL LOW (ref 39.0–52.0)
Hemoglobin: 11.2 g/dL — ABNORMAL LOW (ref 13.0–17.0)
MCH: 31.1 pg (ref 26.0–34.0)
MCHC: 33.6 g/dL (ref 30.0–36.0)
MCV: 92.5 fL (ref 80.0–100.0)
Platelets: 284 10*3/uL (ref 150–400)
RBC: 3.6 MIL/uL — ABNORMAL LOW (ref 4.22–5.81)
RDW: 12.2 % (ref 11.5–15.5)
WBC: 11.2 10*3/uL — ABNORMAL HIGH (ref 4.0–10.5)
nRBC: 0.2 % (ref 0.0–0.2)

## 2020-05-02 LAB — BASIC METABOLIC PANEL
Anion gap: 13 (ref 5–15)
BUN: 18 mg/dL (ref 8–23)
CO2: 27 mmol/L (ref 22–32)
Calcium: 9.2 mg/dL (ref 8.9–10.3)
Chloride: 94 mmol/L — ABNORMAL LOW (ref 98–111)
Creatinine, Ser: 1.1 mg/dL (ref 0.61–1.24)
GFR, Estimated: >60 mL/min (ref >60)
Glucose, Bld: 120 mg/dL — ABNORMAL HIGH (ref 70–99)
Potassium: 3.4 mmol/L — ABNORMAL LOW (ref 3.5–5.1)
Sodium: 134 mmol/L — ABNORMAL LOW (ref 135–145)

## 2020-05-02 LAB — GLUCOSE, CAPILLARY
Glucose-Capillary: 117 mg/dL — ABNORMAL HIGH (ref 70–99)
Glucose-Capillary: 119 mg/dL — ABNORMAL HIGH (ref 70–99)
Glucose-Capillary: 122 mg/dL — ABNORMAL HIGH (ref 70–99)
Glucose-Capillary: 129 mg/dL — ABNORMAL HIGH (ref 70–99)
Glucose-Capillary: 132 mg/dL — ABNORMAL HIGH (ref 70–99)

## 2020-05-02 MED ORDER — DILTIAZEM LOAD VIA INFUSION
10.0000 mg | Freq: Once | INTRAVENOUS | Status: AC
  Administered 2020-05-02: 10 mg via INTRAVENOUS
  Filled 2020-05-02: qty 10

## 2020-05-02 MED ORDER — ENOXAPARIN SODIUM 40 MG/0.4ML ~~LOC~~ SOLN
40.0000 mg | SUBCUTANEOUS | Status: AC
  Administered 2020-05-02 – 2020-05-05 (×4): 40 mg via SUBCUTANEOUS
  Filled 2020-05-02 (×4): qty 0.4

## 2020-05-02 MED ORDER — AMIODARONE IV BOLUS ONLY 150 MG/100ML
150.0000 mg | Freq: Once | INTRAVENOUS | Status: AC
  Administered 2020-05-02: 150 mg via INTRAVENOUS
  Filled 2020-05-02: qty 100

## 2020-05-02 MED ORDER — AMIODARONE HCL IN DEXTROSE 360-4.14 MG/200ML-% IV SOLN
30.0000 mg/h | INTRAVENOUS | Status: DC
  Administered 2020-05-02: 30 mg/h via INTRAVENOUS
  Filled 2020-05-02: qty 200

## 2020-05-02 MED ORDER — DILTIAZEM HCL-DEXTROSE 125-5 MG/125ML-% IV SOLN (PREMIX)
5.0000 mg/h | INTRAVENOUS | Status: DC
  Administered 2020-05-02: 5 mg/h via INTRAVENOUS
  Administered 2020-05-03: 7.5 mg/h via INTRAVENOUS
  Filled 2020-05-02 (×3): qty 125

## 2020-05-02 MED ORDER — AMIODARONE HCL IN DEXTROSE 360-4.14 MG/200ML-% IV SOLN: 30.0000 mg/h | INTRAVENOUS | Status: DC

## 2020-05-02 MED ORDER — AMIODARONE LOAD VIA INFUSION
150.0000 mg | Freq: Once | INTRAVENOUS | Status: DC
  Filled 2020-05-02: qty 83.34

## 2020-05-02 MED ORDER — ASPIRIN EC 81 MG PO TBEC
81.0000 mg | DELAYED_RELEASE_TABLET | Freq: Every day | ORAL | Status: DC
  Administered 2020-05-02 – 2020-05-11 (×10): 81 mg via ORAL
  Filled 2020-05-02 (×10): qty 1

## 2020-05-02 MED ORDER — AMIODARONE IV BOLUS ONLY 150 MG/100ML: 150.0000 mg | Freq: Once | INTRAVENOUS | Status: DC

## 2020-05-02 MED ORDER — WARFARIN SODIUM 2.5 MG PO TABS
2.5000 mg | ORAL_TABLET | Freq: Every day | ORAL | Status: DC
  Administered 2020-05-02: 2.5 mg via ORAL
  Filled 2020-05-02: qty 1

## 2020-05-02 MED ORDER — PROMETHAZINE HCL 25 MG/ML IJ SOLN
12.5000 mg | Freq: Once | INTRAMUSCULAR | Status: AC
  Administered 2020-05-02: 12.5 mg via INTRAVENOUS
  Filled 2020-05-02: qty 1

## 2020-05-02 MED ORDER — OXYCODONE HCL 5 MG PO TABS: 5.0000 mg | ORAL_TABLET | ORAL | Status: DC | PRN

## 2020-05-02 MED ORDER — METOPROLOL TARTRATE 25 MG PO TABS
25.0000 mg | ORAL_TABLET | Freq: Two times a day (BID) | ORAL | Status: DC
  Administered 2020-05-02: 25 mg via ORAL
  Filled 2020-05-02 (×2): qty 1

## 2020-05-02 MED ORDER — POTASSIUM CHLORIDE CRYS ER 20 MEQ PO TBCR
40.0000 meq | EXTENDED_RELEASE_TABLET | Freq: Every day | ORAL | Status: DC
  Administered 2020-05-02: 40 meq via ORAL
  Filled 2020-05-02 (×2): qty 2

## 2020-05-02 MED ORDER — FUROSEMIDE 40 MG PO TABS: 40.0000 mg | ORAL_TABLET | Freq: Every day | ORAL | Status: DC

## 2020-05-02 MED ORDER — WARFARIN - PHYSICIAN DOSING INPATIENT: Freq: Every day | Status: DC

## 2020-05-02 MED ORDER — FUROSEMIDE 10 MG/ML IJ SOLN
40.0000 mg | Freq: Once | INTRAMUSCULAR | Status: AC
  Administered 2020-05-02: 40 mg via INTRAVENOUS
  Filled 2020-05-02: qty 4

## 2020-05-02 MED ORDER — METOPROLOL TARTRATE 25 MG/10 ML ORAL SUSPENSION
25.0000 mg | Freq: Two times a day (BID) | ORAL | Status: DC
  Filled 2020-05-02: qty 10

## 2020-05-02 NOTE — Care Management Important Message (Signed)
Important Message  Patient Details  Name: Guy Morris MRN: [id number] Date of Birth: [birth year]   Medicare Important Message Given:  Yes     [name] 05/02/2020, 11:15 AM

## 2020-05-02 NOTE — Progress Notes (Signed)
CARDIAC REHAB PHASE I   PRE:  Rate/Rhythm: 80 SR  BP:  Supine: 95/71, 104/77  Sitting:   Standing:    SaO2: 94%RA  MODE:  Ambulation: 200 ft   POST:  Rate/Rhythm: 107 ST PACs  BP:  Supine:   Sitting: 125/72  Standing:    SaO2: 96%RA 1020-1101 Pt remained in NSR for walk. Pt walked 200 ft on RA with rollator and asst x 2. Had to sit down twice to rest. Once because of dizziness and second time due to nausea. Gave comfort measures and pt able to walk 100 ft back without stopping. To BSC and then to bed. Bed alarm on. Encouraged walks with Mobility Specialist. Emotional  Support given.    [name], RN BSN  05/02/2020 10:58 AM

## 2020-05-02 NOTE — Progress Notes (Signed)
05/01/20 2044 CCMD called to notify that pts HR sustained in 130s-140s and that he is converting in and out of a fib. Pt states feeling weak and that he can feel his heart race sometimes. 2.5 mg Metoprolol IV given at 2048. EKG completed and in pts chart. Charge nurse notified and vitals frequency increased to Q 1hr. Vitals at 2056 temp 98.5 oral. BP 113/80 (92) HR 85. RR 15. SpO2 98% room air.   2245 Pts HR sustaining in 130s-140s again. BP 99/71 (81). SpO2 95% room air. 2.5 mg metoprolol IV given at 2251. Vitals 2302 HR 85. BP 94/77 (84). RR 20. SpO2 94 room air.  05/02/20 0130 pts HR sustained 140s-150s and converted in and out of a fib again. BP 109/94 (99). 2.5 mg metoprolol IV given. Vitals at 0144 temp 97.9 oral. BP 111/80 (89). HR 80. RR 18. SpO2 94 room air.  Will continue to monitor.  Adella Hare, RN

## 2020-05-02 NOTE — Progress Notes (Signed)
Peripherally Inserted Central Catheter Placement  The IV Nurse has discussed with the patient and/or persons authorized to consent for the patient, the purpose of this procedure and the potential benefits and risks involved with this procedure.  The benefits include less needle sticks, lab draws from the catheter, and the patient may be discharged home with the catheter. Risks include, but not limited to, infection, bleeding, blood clot (thrombus formation), and puncture of an artery; nerve damage and irregular heartbeat and possibility to perform a PICC exchange if needed/ordered by physician.  Alternatives to this procedure were also discussed.  Bard Power PICC patient education guide, fact sheet on infection prevention and patient information card has been provided to patient /or left at bedside.    PICC Placement Documentation  PICC Double Lumen 60/73/71 PICC Right Basilic 42 cm 0 cm (Active)  Indication for Insertion or Continuance of Line Vasoactive infusions 05/02/20 1830  Exposed Catheter (cm) 0 cm 05/02/20 1830  Site Assessment Clean;Dry;Intact 05/02/20 1830  Lumen #1 Status Flushed;Blood return noted;Saline locked 05/02/20 1830  Lumen #2 Status Flushed;Blood return noted;Saline locked 05/02/20 1830  Dressing Type Transparent 05/02/20 1830  Dressing Status Clean;Dry;Intact 05/02/20 1830  Antimicrobial disc in place? Yes 05/02/20 1830  Dressing Change Due 05/09/20 05/02/20 1830       Scotty Court 05/02/2020, 6:33 PM

## 2020-05-02 NOTE — Progress Notes (Signed)
[name]       [place] [address]             [phone]      4 Days Post-Op Procedure(s) (LRB): CORONARY ARTERY BYPASS GRAFTING (CABG) TIMES FOUR USING LIMA to LAD; ENDOSCOPIC HARVESTED RIGHT GREATER SAPHENOUS VEIN AND MAZE PROCEDURE. (N/A) CLIPPING OF ATRIAL APPENDAGE USING ATRICUE 40 MM ATRICLIP EXCLUSION VLAA SYSTEM (Left) MAZE USING ATRICURE EMT1 ISOLATOR ABLATION SYSTEM (N/A) TRANSESOPHAGEAL ECHOCARDIOGRAM (TEE) (N/A) ENDOVEIN HARVEST OF GREATER SAPHENOUS VEIN (Bilateral)   Subjective:  Patient feels poorly. States he is dying.  Objective: Vital signs in last 24 hours: Temp:  [97.9 F (36.6 C)-98.9 F (37.2 C)] 97.9 F (36.6 C) (11/22 0551) Pulse Rate:  [78-148] 79 (11/22 0551) Cardiac Rhythm: Atrial fibrillation;Other (Comment) (11/21 2045) Resp:  [15-28] 19 (11/22 0551) BP: (90-120)/(71-94) 112/81 (11/22 0551) SpO2:  [89 %-98 %] 93 % (11/22 0551) Weight:  [76.7 kg] 76.7 kg (11/22 0426)  Intake/Output from previous day: 11/21 0701 - 11/22 0700 In: 736.6 [P.O.:720; I.V.:16.6] Out: 1910 [Urine:1910]  General appearance: alert, cooperative and no distress Heart: irregularly irregular rhythm Lungs: clear to auscultation bilaterally Abdomen: soft, non-tender; bowel sounds normal; no masses,  no organomegaly Extremities: edema trace Wound: clean and dry  Lab Results: Recent Labs    05/01/20 0457 05/02/20 0159  WBC 9.8 11.2*  HGB 9.6* 11.2*  HCT 29.5* 33.3*  PLT 195 284   BMET:  Recent Labs    05/01/20 0457 05/02/20 0159  NA 135 134*  K 3.8 3.4*  CL 98 94*  CO2 29 27  GLUCOSE 105* 120*  BUN 13 18  CREATININE 0.85 1.10  CALCIUM 8.7* 9.2    PT/INR: No results for input(s): LABPROT, INR in the last 72 hours. ABG    Component Value Date/Time   PHART 7.390 04/28/2020 2022   HCO3 20.6 04/28/2020 2022   TCO2 22 04/28/2020 2022   ACIDBASEDEF 4.0 (H) 04/28/2020 2022   O2SAT 46.7 05/01/2020 0840   CBG (last 3)  Recent  Labs    05/01/20 2036 05/02/20 0010 05/02/20 0433  GLUCAP 118* 129* 119*    Assessment/Plan: S/P Procedure(s) (LRB): CORONARY ARTERY BYPASS GRAFTING (CABG) TIMES FOUR USING LIMA to LAD; ENDOSCOPIC HARVESTED RIGHT GREATER SAPHENOUS VEIN AND MAZE PROCEDURE. (N/A) CLIPPING OF ATRIAL APPENDAGE USING ATRICUE 40 MM ATRICLIP EXCLUSION VLAA SYSTEM (Left) MAZE USING ATRICURE EMT1 ISOLATOR ABLATION SYSTEM (N/A) TRANSESOPHAGEAL ECHOCARDIOGRAM (TEE) (N/A) ENDOVEIN HARVEST OF GREATER SAPHENOUS VEIN (Bilateral)  1. CV- A.Fib/NSR- patient is flipping in and out... Amiodarone has been ordered by Dr. Prescott Gum, increase Lopressor to 25 mg BID, continue oral Amiodarone.. patient will likely need NOAC once EPW removed 2. Pulm- no acute issues, off oxygen, continue IS 3. Renal-creatinine slightly up to 1.10, likely due to aggressive diuresis with lasix, zaroxolyn.. will stop Lasix as no edema on exam and weight is below baseline, monitor.. can restart if needed 4. Hypokalemia- patient aggressively diuresed w/o potassium supplementation ordered, will give 40 meq today, repeat level in AM 5. Expected post operative blood loss anemia, Hgb stable improved at 11.2 6. CBGs controlled, patient is not a diabetic, will d/c SSIP and insulin 7. Dispo- patient flipping in and out of Atrial Fibrillation.. Dr. Prescott Gum has restarted Amiodarone drip.Marland Kitchen need to monitor closely as patient only has peripheral access, stop diuretics as patient with mild elevation in creatinine, no edema on exam, not currently indicated, potassium low at 3.4, will supplement.Marland Kitchen suspect will  need NOAC at discharge once EPW have been removed   LOS: 7 days   Ellwood Handler, PA-C 05/02/2020

## 2020-05-02 NOTE — Progress Notes (Signed)
Mobility Specialist: Progress Note   05/02/20 1401  Mobility  Activity Contraindicated/medical hold   Pre-Mobility:   Lying: 130 HR, 92/81 BP, 95% SpO2  Sitting EOB: 90/78 BP  Standing EOB: 82/72 BP  Lying: 102/81 BP  Pt c/o feeling light headed and dizzy when transferring from supine to sitting as well as during sit to stand. Pt c/o feeling nauseated as well during transfers. RN present in room. Pt positioned back in bed with bed alarm on and call bell by his side, RN notified.   [facility] Jehiel Koepp Mobility Specialist

## 2020-05-02 NOTE — Progress Notes (Signed)
Patient converting in and out of atrial fib and NSR. In Atrial fib   Patient vital signs obtained and PA in room seeing patient. Orders received will monitor patient. Aissa Lisowski, Bettina Gavia RN

## 2020-05-03 ENCOUNTER — Inpatient Hospital Stay (HOSPITAL_COMMUNITY): Payer: Medicare Other

## 2020-05-03 DIAGNOSIS — I313 Pericardial effusion (noninflammatory): Secondary | ICD-10-CM

## 2020-05-03 LAB — CBC
HCT: 33.6 % — ABNORMAL LOW (ref 39.0–52.0)
Hemoglobin: 11.1 g/dL — ABNORMAL LOW (ref 13.0–17.0)
MCH: 31 pg (ref 26.0–34.0)
MCHC: 33 g/dL (ref 30.0–36.0)
MCV: 93.9 fL (ref 80.0–100.0)
Platelets: 337 10*3/uL (ref 150–400)
RBC: 3.58 MIL/uL — ABNORMAL LOW (ref 4.22–5.81)
RDW: 12.4 % (ref 11.5–15.5)
WBC: 10.1 10*3/uL (ref 4.0–10.5)
nRBC: 0 % (ref 0.0–0.2)

## 2020-05-03 LAB — BASIC METABOLIC PANEL
Anion gap: 17 — ABNORMAL HIGH (ref 5–15)
BUN: 28 mg/dL — ABNORMAL HIGH (ref 8–23)
CO2: 27 mmol/L (ref 22–32)
Calcium: 9.2 mg/dL (ref 8.9–10.3)
Chloride: 90 mmol/L — ABNORMAL LOW (ref 98–111)
Creatinine, Ser: 1.31 mg/dL — ABNORMAL HIGH (ref 0.61–1.24)
GFR, Estimated: 59 mL/min — ABNORMAL LOW (ref >60)
Glucose, Bld: 137 mg/dL — ABNORMAL HIGH (ref 70–99)
Potassium: 3.3 mmol/L — ABNORMAL LOW (ref 3.5–5.1)
Sodium: 134 mmol/L — ABNORMAL LOW (ref 135–145)

## 2020-05-03 LAB — ECHOCARDIOGRAM LIMITED
Height: 68 in
S' Lateral: 4.8 cm
Weight: 2705.49 oz

## 2020-05-03 LAB — PROTIME-INR
INR: 1.1 (ref 0.8–1.2)
Prothrombin Time: 14.1 seconds (ref 11.4–15.2)

## 2020-05-03 MED ORDER — FUROSEMIDE 10 MG/ML IJ SOLN: 40.0000 mg | Freq: Once | INTRAMUSCULAR | Status: DC

## 2020-05-03 MED ORDER — WARFARIN SODIUM 2 MG PO TABS
3.0000 mg | ORAL_TABLET | Freq: Every day | ORAL | Status: DC
  Administered 2020-05-03: 3 mg via ORAL
  Filled 2020-05-03: qty 1

## 2020-05-03 MED ORDER — POTASSIUM CHLORIDE 10 MEQ/50ML IV SOLN
10.0000 meq | INTRAVENOUS | Status: AC
  Administered 2020-05-03 (×2): 10 meq via INTRAVENOUS
  Filled 2020-05-03 (×2): qty 50

## 2020-05-03 MED ORDER — POTASSIUM CHLORIDE CRYS ER 20 MEQ PO TBCR
40.0000 meq | EXTENDED_RELEASE_TABLET | Freq: Every day | ORAL | Status: DC
  Filled 2020-05-03: qty 2

## 2020-05-03 MED ORDER — ALUM & MAG HYDROXIDE-SIMETH 200-200-20 MG/5ML PO SUSP
30.0000 mL | ORAL | Status: DC | PRN
  Administered 2020-05-03 – 2020-05-07 (×3): 30 mL via ORAL
  Filled 2020-05-03 (×3): qty 30

## 2020-05-03 MED ORDER — DIGOXIN 125 MCG PO TABS
0.2500 mg | ORAL_TABLET | Freq: Every day | ORAL | Status: DC
  Administered 2020-05-03 – 2020-05-08 (×6): 0.25 mg via ORAL
  Filled 2020-05-03 (×6): qty 2

## 2020-05-03 MED ORDER — PROMETHAZINE HCL 25 MG/ML IJ SOLN
12.5000 mg | Freq: Three times a day (TID) | INTRAMUSCULAR | Status: DC | PRN
  Administered 2020-05-03: 12.5 mg via INTRAVENOUS
  Filled 2020-05-03: qty 1

## 2020-05-03 MED ORDER — METOPROLOL TARTRATE 12.5 MG HALF TABLET
12.5000 mg | ORAL_TABLET | Freq: Two times a day (BID) | ORAL | Status: DC
  Administered 2020-05-03 – 2020-05-11 (×14): 12.5 mg via ORAL
  Filled 2020-05-03 (×15): qty 1

## 2020-05-03 MED ORDER — SIMETHICONE 80 MG PO CHEW
80.0000 mg | CHEWABLE_TABLET | Freq: Four times a day (QID) | ORAL | Status: AC
  Administered 2020-05-03 – 2020-05-06 (×14): 80 mg via ORAL
  Filled 2020-05-03 (×14): qty 1

## 2020-05-03 NOTE — Progress Notes (Signed)
CARDIAC REHAB PHASE I   PRE:  Rate/Rhythm: 107 afib  BP:  Supine: 91/73  Sitting:   Standing:    SaO2: 98%RA  MODE:  Ambulation:    To  chair   ft   POST:  Rate/Rhythm: 126 afib     SaO2: 99%RA 1330-1350 Pt stated he was told by surgeon to take it easy today. Pt agreed to get to chair. Pt assisted to recliner and HR to 126 afib. Assisted with getting comfortable. Has call bell. Will follow up tomorrow.    [name], RN BSN  05/03/2020 1:46 PM

## 2020-05-03 NOTE — Progress Notes (Signed)
  Echocardiogram 2D Echocardiogram has been performed.  Geoffery Lyons Swaim 05/03/2020, 11:10 AM

## 2020-05-03 NOTE — Progress Notes (Signed)
Pt in and out of a fib RVR while on amiodarone drip at 16.7 ml/hr. Pts HR reaching as high as 160s at times while at rest. BP 117/82 (88). Pts MEWS changing between Red and Yellow. Pt is nauseous and constatntly vomiting brown/yellow emesis. PRN Zofran 4 mg given at 2127 but with no relief. Charge Nurse Howard notified of pts condition. Rapid response nurse, Guy Morris, was rounding on transfer pts and assessed Guy Morris. After her assessment she agreed that the on call physician needed to be contacted. Paged Dr. Servando Snare. Dr. Servando Snare ordered a 150 mL bolus of amiodarone and to continue the amiodarone drip at 16.7 ml/hr. When the pt was told that the Dr. wanted to bolus more of the medication, the pt expressed concern because he believes the amiodarone is what is making him so sick. Rapid response nurse, Guy Morris called Dr. Servando Snare back and voiced the pts concerns along with her own and got new orders to discontinue the amiodarone drip and the oral amiodarone, Phenergan 12.5mg  IV, Lasix 40 mg IV, Cardizem bolus of 10 mg in 3 minutes and Cardizem drip at 10 mg/hr. Will continue to monitor. Adella Hare, RN

## 2020-05-03 NOTE — Progress Notes (Addendum)
[address]       [place] [address]             [phone]      5 Days Post-Op Procedure(s) (LRB): CORONARY ARTERY BYPASS GRAFTING (CABG) TIMES FOUR USING LIMA to LAD; ENDOSCOPIC HARVESTED RIGHT GREATER SAPHENOUS VEIN AND MAZE PROCEDURE. (N/A) CLIPPING OF ATRIAL APPENDAGE USING ATRICUE 40 MM ATRICLIP EXCLUSION VLAA SYSTEM (Left) MAZE USING ATRICURE EMT1 ISOLATOR ABLATION SYSTEM (N/A) TRANSESOPHAGEAL ECHOCARDIOGRAM (TEE) (N/A) ENDOVEIN HARVEST OF GREATER SAPHENOUS VEIN (Bilateral)   Subjective:  Patient had horrible night.  Spent most of the evening with vomiting.  He also had uncontrolled Atrial Fibrillation.  He was originally being treated with Amiodarone which the patient felt was making him so sick.  He refused any further treatment with that.  Currently, the patient is resting comfortably.    Objective: Vital signs in last 24 hours: Temp:  [97.5 F (36.4 C)-98.9 F (37.2 C)] 97.9 F (36.6 C) (11/23 0402) Pulse Rate:  [43-131] 117 (11/23 0402) Cardiac Rhythm: Normal sinus rhythm;Atrial fibrillation (11/22 1900) Resp:  [15-21] 18 (11/23 0402) BP: (90-136)/(69-116) 107/76 (11/23 0402) SpO2:  [90 %-98 %] 96 % (11/23 0402)  Intake/Output from previous day: 11/22 0701 - 11/23 0700 In: 395.5 [P.O.:240; I.V.:155.5] Out: 1950 [Urine:1350; Emesis/NG output:600]  General appearance: alert, cooperative and no distress Heart: irregularly irregular rhythm Lungs: clear to auscultation bilaterally Abdomen: soft, non-tender; bowel sounds normal; no masses,  no organomegaly Extremities: edema trac Wound: clean and dry  Lab Results: Recent Labs    05/02/20 0159 05/03/20 0121  WBC 11.2* 10.1  HGB 11.2* 11.1*  HCT 33.3* 33.6*  PLT 284 337   BMET:  Recent Labs    05/02/20 0159 05/03/20 0121  NA 134* 134*  K 3.4* 3.3*  CL 94* 90*  CO2 27 27  GLUCOSE 120* 137*  BUN 18 28*  CREATININE 1.10 1.31*  CALCIUM 9.2 9.2    PT/INR:  Recent Labs     05/03/20 0121  LABPROT 14.1  INR 1.1   ABG    Component Value Date/Time   PHART 7.390 04/28/2020 2022   HCO3 20.6 04/28/2020 2022   TCO2 22 04/28/2020 2022   ACIDBASEDEF 4.0 (H) 04/28/2020 2022   O2SAT 46.7 05/01/2020 0840   CBG (last 3)  Recent Labs    05/02/20 0803 05/02/20 1204 05/02/20 1604  GLUCAP 132* 117* 122*    Assessment/Plan: S/P Procedure(s) (LRB): CORONARY ARTERY BYPASS GRAFTING (CABG) TIMES FOUR USING LIMA to LAD; ENDOSCOPIC HARVESTED RIGHT GREATER SAPHENOUS VEIN AND MAZE PROCEDURE. (N/A) CLIPPING OF ATRIAL APPENDAGE USING ATRICUE 40 MM ATRICLIP EXCLUSION VLAA SYSTEM (Left) MAZE USING ATRICURE EMT1 ISOLATOR ABLATION SYSTEM (N/A) TRANSESOPHAGEAL ECHOCARDIOGRAM (TEE) (N/A) ENDOVEIN HARVEST OF GREATER SAPHENOUS VEIN (Bilateral)  1. CV- patient continues to flip in and out of Atrial Fibrillation- remains on Lopressor, currently on Cardizem drip.Marland Kitchen coumadin was initiated yesterday at 2.5, INR at 1.1 will continue at 2.5 for now 2. Pulm- no acute issues, continue IS 3. Renal- creatinine at 1.31, likely due to diuretics, will stop lasix 4. Expected post operative blood loss anemia mild at 11.1 5. Hypokalemia- at 3.3, will supplement again today 6. Dispo- patient had rough night, continues to flip in and out of Atrial Fibrillation, unable to tolerate Amiodarone due to vomiting,nausea.. currently on Cardizem drip will continue, on Lopressor 25 mg BID unsure if BP will tolerate, continue coumadin, supplement K...continue current care   LOS: 8 days   Junie Panning  Barrett, PA-C 05/03/2020  nausea probably due to medication- amiodarone stopped, now on Caredizem drip with HR 110 afib, feels much better  Cont with po metoprolol, po lanoxin added for rate control Slowly loading coumadin- prob DC EPWs tomorrow  patient examined and medical record reviewed,agree with above note. Tharon Aquas Trigt III 05/03/2020

## 2020-05-03 NOTE — Discharge Instructions (Addendum)
Please fax coumadin results too: [facility]   [phone] Discharge Instructions:  1. You may shower, please wash incisions daily with soap and water and keep dry.  If you wish to cover wounds with dressing you may do so but please keep clean and change daily.  No tub baths or swimming until incisions have completely healed.  If your incisions become red or develop any drainage please call our office at [phone]  2. No Driving until cleared by Dr. Lucianne Lei Trigt's office and you are no longer using narcotic pain medications  3. Monitor your weight daily.. Please use the same scale and weigh at same time... If you gain 5-10 lbs in 48 hours with associated lower extremity swelling, please contact our office at [phone]  4. Fever of 101.5 for at least 24 hours with no source, please contact our office at [phone]  5. Activity- up as tolerated, please walk at least 3 times per day.  Avoid strenuous activity, no lifting, pushing, or pulling with your arms over 8-10 lbs for a minimum of 6 weeks  6. If any questions or concerns arise, please do not hesitate to contact our office at [phone]  Information on my medicine - Coumadin   (Warfarin)  This medication education was reviewed with me or my healthcare representative as part of my discharge preparation.  The pharmacist that spoke with me during my hospital stay was:  Einar Grad, [facility]  Why was Coumadin prescribed for you? Coumadin was prescribed for you because you have a blood clot or a medical condition that can cause an increased risk of forming blood clots. Blood clots can cause serious health problems by blocking the flow of blood to the heart, lung, or brain. Coumadin can prevent harmful blood clots from forming. As a reminder your indication for Coumadin is:   Stroke Prevention Because Of Atrial Fibrillation  What test will check on my response to Coumadin? While on Coumadin (warfarin) you will need to  have an INR test regularly to ensure that your dose is keeping you in the desired range. The INR (international normalized ratio) number is calculated from the result of the laboratory test called prothrombin time (PT).  If an INR APPOINTMENT HAS NOT ALREADY BEEN MADE FOR YOU please schedule an appointment to have this lab work done by your health care provider within 7 days. Your INR goal is usually a number between:  2 to 3 or your provider may give you a more narrow range like 2-2.5.  Ask your health care provider during an office visit what your goal INR is.  What  do you need to  know  About  COUMADIN? Take Coumadin (warfarin) exactly as prescribed by your healthcare provider about the same time each day.  DO NOT stop taking without talking to the doctor who prescribed the medication.  Stopping without other blood clot prevention medication to take the place of Coumadin may increase your risk of developing a new clot or stroke.  Get refills before you run out.  What do you do if you miss a dose? If you miss a dose, take it as soon as you remember on the same day then continue your regularly scheduled regimen the next day.  Do not take two doses of Coumadin at the same time.  Important Safety Information A possible side effect of Coumadin (Warfarin) is an increased risk of bleeding. You should call your healthcare provider right away if you experience any of  the following: ? Bleeding from an injury or your nose that does not stop. ? Unusual colored urine (red or dark brown) or unusual colored stools (red or black). ? Unusual bruising for unknown reasons. ? A serious fall or if you hit your head (even if there is no bleeding).  Some foods or medicines interact with Coumadin (warfarin) and might alter your response to warfarin. To help avoid this: ? Eat a balanced diet, maintaining a consistent amount of Vitamin K. ? Notify your provider about major diet changes you plan to make. ? Avoid  alcohol or limit your intake to 1 drink for women and 2 drinks for men per day. (1 drink is 5 oz. wine, 12 oz. beer, or 1.5 oz. liquor.)  Make sure that ANY health care provider who prescribes medication for you knows that you are taking Coumadin (warfarin).  Also make sure the healthcare provider who is monitoring your Coumadin knows when you have started a new medication including herbals and non-prescription products.  Coumadin (Warfarin)  Major Drug Interactions  Increased Warfarin Effect Decreased Warfarin Effect  Alcohol (large quantities) Antibiotics (esp. Septra/Bactrim, Flagyl, Cipro) Amiodarone (Cordarone) Aspirin (ASA) Cimetidine (Tagamet) Megestrol (Megace) NSAIDs (ibuprofen, naproxen, etc.) Piroxicam (Feldene) Propafenone (Rythmol SR) Propranolol (Inderal) Isoniazid (INH) Posaconazole (Noxafil) Barbiturates (Phenobarbital) Carbamazepine (Tegretol) Chlordiazepoxide (Librium) Cholestyramine (Questran) Griseofulvin Oral Contraceptives Rifampin Sucralfate (Carafate) Vitamin K   Coumadin (Warfarin) Major Herbal Interactions  Increased Warfarin Effect Decreased Warfarin Effect  Garlic Ginseng Ginkgo biloba Coenzyme Q10 Green tea St. John's wort    Coumadin (Warfarin) FOOD Interactions  Eat a consistent number of servings per week of foods HIGH in Vitamin K (1 serving =  cup)  Collards (cooked, or boiled & drained) Kale (cooked, or boiled & drained) Mustard greens (cooked, or boiled & drained) Parsley *serving size only =  cup Spinach (cooked, or boiled & drained) Swiss chard (cooked, or boiled & drained) Turnip greens (cooked, or boiled & drained)  Eat a consistent number of servings per week of foods MEDIUM-HIGH in Vitamin K (1 serving = 1 cup)  Asparagus (cooked, or boiled & drained) Broccoli (cooked, boiled & drained, or raw & chopped) Brussel sprouts (cooked, or boiled & drained) *serving size only =  cup Lettuce, raw (green leaf, endive,  romaine) Spinach, raw Turnip greens, raw & chopped   These websites have more information on Coumadin (warfarin):  FailFactory.se; VeganReport.com.au;

## 2020-05-03 NOTE — Progress Notes (Signed)
Pt is no longer vomiting and HR is staying between 80-130s. Cardizem drip currently at 7.5 ml/hr. Never titrated drip up to 10 ml/hr because pressures running soft. 0630 Vitals: HR 112. BP 102/87 (94). RR 19. SpO2 95 room air. Adella Hare, RN

## 2020-05-03 NOTE — Progress Notes (Signed)
Patient c/o sudden nausea and a burning sensation moving up midline chest.   New orders received for Mylanta and phenergan. Given and pt states he is feeling somewhat better but not great.  No vomitus thus far.  Will continue to monitor.

## 2020-05-03 NOTE — Progress Notes (Signed)
Mobility Specialist: Progress Note   05/03/20 1824  Mobility  Activity Refused mobility   Pt refused mobility due to feeling nauseated, RN aware. Will f/u tomorrow.   [facility] Aloysuis Ribaudo Mobility Specialist

## 2020-05-03 NOTE — Significant Event (Signed)
Rapid Response Event Note   Reason for Call :  Was not called by circling back on transfer patient due to abnormal VS  Initial Focused Assessment:  Continuous vomiting, flipping in an out of afib RVR 140-160's.  Mildly short of breath, crackles bilateral bases, restless      Interventions:  Cardizem 10mg  bolus then drip at 10mg /hr Lasix 40mg  IVP once Phenergan 12.5mg  IVP once Discontinue amiodarone drip and po   Plan of Care:  Reassess after other meds given   Event Summary:   MD Notified: 2314 Call Time: n/a Arrival Time: 2300 End Time: 2345  Charlyne Quale, RN

## 2020-05-03 NOTE — Progress Notes (Signed)
Pt c/o nausea since start of shift. Pt has had 2 occurrences of yellow emesis. He is refusing to take PO medications because he does not want to throw them up. At this time he has agreed to take only his coumadin but not his metoprolol tartrate or simethicone. Will continue to monitor.  Adella Hare, RN

## 2020-05-04 ENCOUNTER — Inpatient Hospital Stay (HOSPITAL_COMMUNITY): Payer: Medicare Other

## 2020-05-04 LAB — HEPATIC FUNCTION PANEL
ALT: 53 U/L — ABNORMAL HIGH (ref 0–44)
AST: 51 U/L — ABNORMAL HIGH (ref 15–41)
Albumin: 3.4 g/dL — ABNORMAL LOW (ref 3.5–5.0)
Alkaline Phosphatase: 74 U/L (ref 38–126)
Bilirubin, Direct: 0.4 mg/dL — ABNORMAL HIGH (ref 0.0–0.2)
Indirect Bilirubin: 1.2 mg/dL — ABNORMAL HIGH (ref 0.3–0.9)
Total Bilirubin: 1.6 mg/dL — ABNORMAL HIGH (ref 0.3–1.2)
Total Protein: 6.5 g/dL (ref 6.5–8.1)

## 2020-05-04 LAB — CBC
HCT: 32.3 % — ABNORMAL LOW (ref 39.0–52.0)
Hemoglobin: 10.8 g/dL — ABNORMAL LOW (ref 13.0–17.0)
MCH: 31.9 pg (ref 26.0–34.0)
MCHC: 33.4 g/dL (ref 30.0–36.0)
MCV: 95.3 fL (ref 80.0–100.0)
Platelets: 389 10*3/uL (ref 150–400)
RBC: 3.39 MIL/uL — ABNORMAL LOW (ref 4.22–5.81)
RDW: 12.6 % (ref 11.5–15.5)
WBC: 9.8 10*3/uL (ref 4.0–10.5)
nRBC: 0.2 % (ref 0.0–0.2)

## 2020-05-04 LAB — PROTIME-INR
INR: 1.2 (ref 0.8–1.2)
Prothrombin Time: 14.2 seconds (ref 11.4–15.2)

## 2020-05-04 LAB — BASIC METABOLIC PANEL
Anion gap: 14 (ref 5–15)
BUN: 22 mg/dL (ref 8–23)
CO2: 28 mmol/L (ref 22–32)
Calcium: 9 mg/dL (ref 8.9–10.3)
Chloride: 89 mmol/L — ABNORMAL LOW (ref 98–111)
Creatinine, Ser: 1.09 mg/dL (ref 0.61–1.24)
GFR, Estimated: >60 mL/min (ref >60)
Glucose, Bld: 104 mg/dL — ABNORMAL HIGH (ref 70–99)
Potassium: 3.4 mmol/L — ABNORMAL LOW (ref 3.5–5.1)
Sodium: 131 mmol/L — ABNORMAL LOW (ref 135–145)

## 2020-05-04 LAB — AMYLASE: Amylase: 65 U/L (ref 28–100)

## 2020-05-04 MED ORDER — DILTIAZEM HCL ER 60 MG PO CP12
60.0000 mg | ORAL_CAPSULE | Freq: Two times a day (BID) | ORAL | Status: DC
  Filled 2020-05-04: qty 1

## 2020-05-04 MED ORDER — METOCLOPRAMIDE HCL 5 MG/ML IJ SOLN
10.0000 mg | Freq: Four times a day (QID) | INTRAMUSCULAR | Status: DC
  Administered 2020-05-04: 10 mg via INTRAVENOUS
  Filled 2020-05-04: qty 2

## 2020-05-04 MED ORDER — ENSURE ENLIVE PO LIQD
237.0000 mL | Freq: Three times a day (TID) | ORAL | Status: DC
  Administered 2020-05-05 – 2020-05-10 (×2): 237 mL via ORAL

## 2020-05-04 MED ORDER — WARFARIN SODIUM 4 MG PO TABS
4.0000 mg | ORAL_TABLET | Freq: Every day | ORAL | Status: DC
  Administered 2020-05-04 – 2020-05-05 (×2): 4 mg via ORAL
  Filled 2020-05-04 (×2): qty 1

## 2020-05-04 MED ORDER — DILTIAZEM HCL-DEXTROSE 125-5 MG/125ML-% IV SOLN (PREMIX)
5.0000 mg/h | INTRAVENOUS | Status: DC
  Administered 2020-05-04 – 2020-05-05 (×4): 10 mg/h via INTRAVENOUS
  Filled 2020-05-04 (×5): qty 125

## 2020-05-04 MED ORDER — HYDROXYZINE HCL 25 MG PO TABS
25.0000 mg | ORAL_TABLET | Freq: Three times a day (TID) | ORAL | Status: DC | PRN
  Administered 2020-05-05: 25 mg via ORAL
  Filled 2020-05-04 (×2): qty 1

## 2020-05-04 MED ORDER — PROMETHAZINE HCL 25 MG/ML IJ SOLN
12.5000 mg | Freq: Three times a day (TID) | INTRAMUSCULAR | Status: DC | PRN
  Administered 2020-05-05 (×2): 12.5 mg via INTRAVENOUS
  Filled 2020-05-04 (×2): qty 1

## 2020-05-04 MED ORDER — POTASSIUM CHLORIDE 10 MEQ/50ML IV SOLN
10.0000 meq | INTRAVENOUS | Status: AC
  Administered 2020-05-04 (×4): 10 meq via INTRAVENOUS
  Filled 2020-05-04 (×4): qty 50

## 2020-05-04 NOTE — Progress Notes (Signed)
Mobility Specialist: Progress Note   05/04/20 1454  Mobility  Activity Ambulated in hall  Level of Assistance Modified independent, requires aide device or extra time  Assistive Device Front wheel walker  Distance Ambulated (ft) 580 ft  Mobility Response Tolerated well  Mobility performed by Mobility specialist  $Mobility charge 1 Mobility   Pre-Mobility: 93 HR, 93% SpO2 Post-Mobility: 121 HR, 129/91 BP, 100% SpO2  Pt was asx during ambulation. Pt states he is feeling much better today and is eager to continue ambulation. Pt on EOB with wife to brush his teeth.   [facility] Patrina Andreas Mobility Specialist

## 2020-05-04 NOTE — Progress Notes (Signed)
CARDIAC REHAB PHASE I   PRE:  Rate/Rhythm: 94 afib  BP:  Supine: 86/52, 115/89  Sitting:   Standing:    SaO2: 94%RA  MODE:  Ambulation: 470 ft   POST:  Rate/Rhythm: 120's afib  BP:  Supine:   Sitting: 116/99  Standing:    SaO2: 97%RA 1247-1317 Pt in good spirits and wanting to walk. Pt walked 470 ft on RA with rollator and asst x 1 for IV. Did not need to rest. HR in 120's at the highest. Pt wants to walk more today. Notified Mobility specialist. To recliner with call bell.   [name], RN BSN  05/04/2020 1:13 PM

## 2020-05-04 NOTE — Progress Notes (Addendum)
[address]       [place] [address]             [phone]      6 Days Post-Op Procedure(s) (LRB): CORONARY ARTERY BYPASS GRAFTING (CABG) TIMES FOUR USING LIMA to LAD; ENDOSCOPIC HARVESTED RIGHT GREATER SAPHENOUS VEIN AND MAZE PROCEDURE. (N/A) CLIPPING OF ATRIAL APPENDAGE USING ATRICUE 40 MM ATRICLIP EXCLUSION VLAA SYSTEM (Left) MAZE USING ATRICURE EMT1 ISOLATOR ABLATION SYSTEM (N/A) TRANSESOPHAGEAL ECHOCARDIOGRAM (TEE) (N/A) ENDOVEIN HARVEST OF GREATER SAPHENOUS VEIN (Bilateral)  Subjective:  Patient continues to feel poorly, but he is better than he had been.  He had more nausea/vomiting overnight.     Objective: Vital signs in last 24 hours: Temp:  [97.6 F (36.4 C)-98.5 F (36.9 C)] 98.5 F (36.9 C) (11/24 0735) Pulse Rate:  [64-116] 105 (11/24 0740) Cardiac Rhythm: Atrial flutter (11/24 0201) Resp:  [15-22] 18 (11/24 0735) BP: (97-114)/(68-92) 101/89 (11/24 0735) SpO2:  [91 %-98 %] 96 % (11/24 0735) Weight:  [76.5 kg] 76.5 kg (11/24 0500)  Intake/Output from previous day: 11/23 0701 - 11/24 0700 In: 905.3 [P.O.:480; I.V.:425.3] Out: 750 [Urine:750] Intake/Output this shift: Total I/O In: -  Out: 400 [Urine:400]  General appearance: alert, cooperative and no distress Heart: irregularly irregular rhythm Lungs: clear to auscultation bilaterally Abdomen: soft, non-tender; bowel sounds normal; no masses,  no organomegaly Extremities: edema none appreciated Wound: clean and dry  Lab Results: Recent Labs    05/03/20 0121 05/04/20 0436  WBC 10.1 9.8  HGB 11.1* 10.8*  HCT 33.6* 32.3*  PLT 337 389   BMET:  Recent Labs    05/03/20 0121 05/04/20 0436  NA 134* 131*  K 3.3* 3.4*  CL 90* 89*  CO2 27 28  GLUCOSE 137* 104*  BUN 28* 22  CREATININE 1.31* 1.09  CALCIUM 9.2 9.0    PT/INR:  Recent Labs    05/04/20 0436  LABPROT 14.2  INR 1.2   ABG    Component Value Date/Time   PHART 7.390 04/28/2020 2022   HCO3 20.6  04/28/2020 2022   TCO2 22 04/28/2020 2022   ACIDBASEDEF 4.0 (H) 04/28/2020 2022   O2SAT 46.7 05/01/2020 0840   CBG (last 3)  Recent Labs    05/02/20 0803 05/02/20 1204 05/02/20 1604  GLUCAP 132* 117* 122*    Assessment/Plan: S/P Procedure(s) (LRB): CORONARY ARTERY BYPASS GRAFTING (CABG) TIMES FOUR USING LIMA to LAD; ENDOSCOPIC HARVESTED RIGHT GREATER SAPHENOUS VEIN AND MAZE PROCEDURE. (N/A) CLIPPING OF ATRIAL APPENDAGE USING ATRICUE 40 MM ATRICLIP EXCLUSION VLAA SYSTEM (Left) MAZE USING ATRICURE EMT1 ISOLATOR ABLATION SYSTEM (N/A) TRANSESOPHAGEAL ECHOCARDIOGRAM (TEE) (N/A) ENDOVEIN HARVEST OF GREATER SAPHENOUS VEIN (Bilateral)  1. CV- I/O of Atrial Fibrillation/Flutter, NSR-- will transition off Cardizem drip to oral regimen, continue Digoxin, Lopressor ordered as BP allows.. will d/c EPW today 2.  INR 1.2, on coumadin, will increase to 4 mg daily 3. Renal- creatinine stable, K remains low at 3.4, will replace today with IV runs of K 4. GI- persistent nausea/vomiting-will start reglan scheduled to see if helps 5. Dispo- patient stable, continues to have A. Fib/Flutter/NSR-change to cardizem tablets, continue Digoxin, continue coumadin, supplement K, reglan for nausea   LOS: 9 days    Ellwood Handler, PA-C 05/04/2020  A-flutter this am briefly converted to nsr with rapid atrial pacing Iv Cardizem increased to 10 mg Persistent nausea- reglan makes him feel worse so will stop Cont slow coumadin load-  donot DC EPWs today as they could  be useful to treat flutter  patient examined and medical record reviewed,agree with above note. Tharon Aquas Trigt III 05/04/2020

## 2020-05-04 NOTE — Progress Notes (Signed)
Reglan given this am at 746 resulted in sudden onset nausea, feeling hot and intestinal pain.   PA notified.  New orders received for ABD xray 830am MD at bedside.  New orders pending.

## 2020-05-04 NOTE — Progress Notes (Addendum)
Called by nursing the patient is feeling worse than initial assessment this morning.  She states that she gave him the his medication including new reglan order and he developed sweats, abdominal pain, and worsening nausea.  The patient hasn't really received relief from N/V despite cessation of Amiodarone.   Dr. Prescott Gum is in room assessing patient...  Plan was to continue Cardizem drip Attempted Atrial Pacing for A. Flutter which was unsuccessful, EPW to remain in place,  Diet changed to full liquid for now, will check abd film to ensure no Ileus is present... patient evidently had diarrhea the other night, which was straight water... continue current care    ABD Film- no ileus present, stool present throughout    Ellwood Handler, PA-C

## 2020-05-05 LAB — CBC
HCT: 31.8 % — ABNORMAL LOW (ref 39.0–52.0)
Hemoglobin: 10.5 g/dL — ABNORMAL LOW (ref 13.0–17.0)
MCH: 31.2 pg (ref 26.0–34.0)
MCHC: 33 g/dL (ref 30.0–36.0)
MCV: 94.4 fL (ref 80.0–100.0)
Platelets: 397 10*3/uL (ref 150–400)
RBC: 3.37 MIL/uL — ABNORMAL LOW (ref 4.22–5.81)
RDW: 12.9 % (ref 11.5–15.5)
WBC: 10.8 10*3/uL — ABNORMAL HIGH (ref 4.0–10.5)
nRBC: 0 % (ref 0.0–0.2)

## 2020-05-05 LAB — PROTIME-INR
INR: 1.2 (ref 0.8–1.2)
Prothrombin Time: 14.6 seconds (ref 11.4–15.2)

## 2020-05-05 LAB — BASIC METABOLIC PANEL
Anion gap: 13 (ref 5–15)
BUN: 15 mg/dL (ref 8–23)
CO2: 25 mmol/L (ref 22–32)
Calcium: 9 mg/dL (ref 8.9–10.3)
Chloride: 92 mmol/L — ABNORMAL LOW (ref 98–111)
Creatinine, Ser: 0.99 mg/dL (ref 0.61–1.24)
GFR, Estimated: >60 mL/min (ref >60)
Glucose, Bld: 107 mg/dL — ABNORMAL HIGH (ref 70–99)
Potassium: 3.6 mmol/L (ref 3.5–5.1)
Sodium: 130 mmol/L — ABNORMAL LOW (ref 135–145)

## 2020-05-05 MED ORDER — LACTATED RINGERS IV SOLN: INTRAVENOUS | Status: DC

## 2020-05-05 MED ORDER — SODIUM CHLORIDE 0.9 % IV SOLN: INTRAVENOUS | Status: DC

## 2020-05-05 MED ORDER — POTASSIUM CHLORIDE 10 MEQ/50ML IV SOLN
10.0000 meq | INTRAVENOUS | Status: AC
  Administered 2020-05-05 (×4): 10 meq via INTRAVENOUS
  Filled 2020-05-05 (×4): qty 50

## 2020-05-05 NOTE — Progress Notes (Signed)
Pt requesting earlier to walk after bath. Returned to offer to walk with pt, pt states he just got done throwing up and needs time. Encouraged ambulation as able later today.  Rufina Falco, RN BSN 05/05/2020 1:57 PM

## 2020-05-05 NOTE — Significant Event (Signed)
Rapid Response Event Note   Reason for Call :  Afib RVR and EKG reading "STEMI"  Per RN, pt walked to bathroom and developed HR-130s(he is on a cardizem gtt for ongoing Afib RVR) and ABD pain(this has been ongoing as well). Once back in bed, HR remained high. RN increased cardizem gtt to 15mg  and did EKG.  EKG showing:  Atrial flutter with variable A-V block Inferior infarct , possibly acute ST & T wave abnormality, consider anterolateral ischemia Prolonged QT ** ** ACUTE MI / STEMI ** ** Consider right ventricular involvement   His ABD xray that was done yesterday showed possible ileus/distal small bowel obstruction.  Initial Focused Assessment:  Pt laying in bed in no distress. Pt denies chest pain/SOB but does c/o epigastric pain/N/V which he has been having over the last few days. His HR is back down to 90s(Afib/A-flutter), BP-127/81, RR-18, SpO2-99% on 2L [place]. Lungs clear and diminished. Skin cool to touch.  EKG reviewed and STE is present in prior EKG as well.  Interventions:  LR @ 100cc/hr Use Zofran/phenergan for N/V(this was already a PRN order  Plan of Care:  Continue cardizem for afib/flutter. Give antiemetics for N/V. LR for hydration. Continue to monitor pt. Call RRT if further assistance needed.   Event Summary:   MD Notified: Dr Julien Girt notified by bedside RN Call Dunlap, Annette Bertelson Anderson, RN

## 2020-05-05 NOTE — Progress Notes (Signed)
[address]       [place] [address]             [phone]      7 Days Post-Op Procedure(s) (LRB): CORONARY ARTERY BYPASS GRAFTING (CABG) TIMES FOUR USING LIMA to LAD; ENDOSCOPIC HARVESTED RIGHT GREATER SAPHENOUS VEIN AND MAZE PROCEDURE. (N/A) CLIPPING OF ATRIAL APPENDAGE USING ATRICUE 40 MM ATRICLIP EXCLUSION VLAA SYSTEM (Left) MAZE USING ATRICURE EMT1 ISOLATOR ABLATION SYSTEM (N/A) TRANSESOPHAGEAL ECHOCARDIOGRAM (TEE) (N/A) ENDOVEIN HARVEST OF GREATER SAPHENOUS VEIN (Bilateral)   Subjective:  Patient feeling better.  His N/V has resolved.  He was able to get up and ambulate yesterday.   + BM  Objective: Vital signs in last 24 hours: Temp:  [97.6 F (36.4 C)-98.7 F (37.1 C)] 98.4 F (36.9 C) (11/25 0450) Pulse Rate:  [80-111] 90 (11/25 0450) Cardiac Rhythm: Atrial fibrillation (11/25 0407) Resp:  [13-35] 19 (11/25 0450) BP: (99-132)/(71-97) 106/72 (11/25 0450) SpO2:  [93 %-97 %] 93 % (11/25 0450) Weight:  [77.8 kg] 77.8 kg (11/25 0430)  Intake/Output from previous day: 11/24 0701 - 11/25 0700 In: 908.7 [P.O.:250; I.V.:658.7] Out: 400 [Urine:400]  General appearance: alert, cooperative and no distress Heart: irregularly irregular rhythm Lungs: clear to auscultation bilaterally Abdomen: soft, non-tender; bowel sounds normal; no masses,  no organomegaly Extremities: extremities normal, atraumatic, no cyanosis or edema Wound: clean and dry  Lab Results: Recent Labs    05/04/20 0436 05/05/20 0500  WBC 9.8 10.8*  HGB 10.8* 10.5*  HCT 32.3* 31.8*  PLT 389 397   BMET:  Recent Labs    05/04/20 0436 05/05/20 0500  NA 131* 130*  K 3.4* 3.6  CL 89* 92*  CO2 28 25  GLUCOSE 104* 107*  BUN 22 15  CREATININE 1.09 0.99  CALCIUM 9.0 9.0    PT/INR:  Recent Labs    05/05/20 0500  LABPROT 14.6  INR 1.2   ABG    Component Value Date/Time   PHART 7.390 04/28/2020 2022   HCO3 20.6 04/28/2020 2022   TCO2 22 04/28/2020 2022    ACIDBASEDEF 4.0 (H) 04/28/2020 2022   O2SAT 46.7 05/01/2020 0840   CBG (last 3)  Recent Labs    05/02/20 1204 05/02/20 1604  GLUCAP 117* 122*    Assessment/Plan: S/P Procedure(s) (LRB): CORONARY ARTERY BYPASS GRAFTING (CABG) TIMES FOUR USING LIMA to LAD; ENDOSCOPIC HARVESTED RIGHT GREATER SAPHENOUS VEIN AND MAZE PROCEDURE. (N/A) CLIPPING OF ATRIAL APPENDAGE USING ATRICUE 40 MM ATRICLIP EXCLUSION VLAA SYSTEM (Left) MAZE USING ATRICURE EMT1 ISOLATOR ABLATION SYSTEM (N/A) TRANSESOPHAGEAL ECHOCARDIOGRAM (TEE) (N/A) ENDOVEIN HARVEST OF GREATER SAPHENOUS VEIN (Bilateral)  1. CV- I/O A. Fib/NSR- continue IV cardizem today, will transition to oral regimen tomorrow as discussed with Dr. Prescott Gum, continue Lopressor, Digoxin, BP remains labile continue Midodrine for now 2. Pulm- no acute issues, continue IS 3. Renal- creatinine WNL, weight is stable.. no lasix at this time, K is at 3.6, will supplement again today, would be beneficial to be closer to 4 4. GI- N/V resolved, continue prn anti-emetics  5. Dispo- patient feeling much better today, continues to have A. Fib at times with better rate control... INR is 1.2 today, will repeat coumadin at 4 mg today, plan to d/c EPW in AM, will transition to oral Cardizem in AM, K is at 3.6, will supplement again today to get closer to 4, N/V resolved, patient is ambulating without difficulty, patient remains stable, can likely be discharged this weekend   LOS:  10 days    Ellwood Handler, PA-C 05/05/2020

## 2020-05-06 ENCOUNTER — Inpatient Hospital Stay (HOSPITAL_COMMUNITY): Payer: Medicare Other

## 2020-05-06 LAB — BASIC METABOLIC PANEL
Anion gap: 14 (ref 5–15)
BUN: 14 mg/dL (ref 8–23)
CO2: 22 mmol/L (ref 22–32)
Calcium: 8.4 mg/dL — ABNORMAL LOW (ref 8.9–10.3)
Chloride: 96 mmol/L — ABNORMAL LOW (ref 98–111)
Creatinine, Ser: 0.93 mg/dL (ref 0.61–1.24)
GFR, Estimated: >60 mL/min (ref >60)
Glucose, Bld: 95 mg/dL (ref 70–99)
Potassium: 3.6 mmol/L (ref 3.5–5.1)
Sodium: 132 mmol/L — ABNORMAL LOW (ref 135–145)

## 2020-05-06 LAB — CBC WITH DIFFERENTIAL/PLATELET
Abs Immature Granulocytes: 0.1 10*3/uL — ABNORMAL HIGH (ref 0.00–0.07)
Basophils Absolute: 0 10*3/uL (ref 0.0–0.1)
Basophils Relative: 0 %
Eosinophils Absolute: 0.1 10*3/uL (ref 0.0–0.5)
Eosinophils Relative: 1 %
HCT: 29.2 % — ABNORMAL LOW (ref 39.0–52.0)
Hemoglobin: 9.6 g/dL — ABNORMAL LOW (ref 13.0–17.0)
Immature Granulocytes: 1 %
Lymphocytes Relative: 15 %
Lymphs Abs: 1.4 10*3/uL (ref 0.7–4.0)
MCH: 31.5 pg (ref 26.0–34.0)
MCHC: 32.9 g/dL (ref 30.0–36.0)
MCV: 95.7 fL (ref 80.0–100.0)
Monocytes Absolute: 1 10*3/uL (ref 0.1–1.0)
Monocytes Relative: 10 %
Neutro Abs: 6.9 10*3/uL (ref 1.7–7.7)
Neutrophils Relative %: 73 %
Platelets: 387 10*3/uL (ref 150–400)
RBC: 3.05 MIL/uL — ABNORMAL LOW (ref 4.22–5.81)
RDW: 13 % (ref 11.5–15.5)
WBC: 9.5 10*3/uL (ref 4.0–10.5)
nRBC: 0 % (ref 0.0–0.2)

## 2020-05-06 LAB — PROTIME-INR
INR: 1.4 — ABNORMAL HIGH (ref 0.8–1.2)
Prothrombin Time: 16.9 seconds — ABNORMAL HIGH (ref 11.4–15.2)

## 2020-05-06 MED ORDER — WARFARIN SODIUM 5 MG PO TABS
5.0000 mg | ORAL_TABLET | Freq: Every day | ORAL | Status: DC
  Administered 2020-05-06 – 2020-05-07 (×2): 5 mg via ORAL
  Filled 2020-05-06 (×2): qty 1

## 2020-05-06 MED ORDER — IOHEXOL 300 MG/ML  SOLN
100.0000 mL | Freq: Once | INTRAMUSCULAR | Status: AC | PRN
  Administered 2020-05-06: 100 mL via INTRAVENOUS

## 2020-05-06 MED ORDER — ONDANSETRON HCL 4 MG/2ML IJ SOLN
4.0000 mg | Freq: Once | INTRAMUSCULAR | Status: AC
  Administered 2020-05-06: 4 mg via INTRAVENOUS
  Filled 2020-05-06: qty 2

## 2020-05-06 MED ORDER — IOHEXOL 9 MG/ML PO SOLN
500.0000 mL | ORAL | Status: AC
  Administered 2020-05-06 (×2): 500 mL via ORAL

## 2020-05-06 NOTE — Care Management Important Message (Signed)
Important Message  Patient Details  Name: Guy Morris MRN: [id number] Date of Birth: 09-29-1950   Medicare Important Message Given:  Yes     [name] 05/06/2020, 8:31 AM

## 2020-05-06 NOTE — Progress Notes (Addendum)
[address]       [place] [address]             [phone]      8 Days Post-Op Procedure(s) (LRB): CORONARY ARTERY BYPASS GRAFTING (CABG) TIMES FOUR USING LIMA to LAD; ENDOSCOPIC HARVESTED RIGHT GREATER SAPHENOUS VEIN AND MAZE PROCEDURE. (N/A) CLIPPING OF ATRIAL APPENDAGE USING ATRICUE 40 MM ATRICLIP EXCLUSION VLAA SYSTEM (Left) MAZE USING ATRICURE EMT1 ISOLATOR ABLATION SYSTEM (N/A) TRANSESOPHAGEAL ECHOCARDIOGRAM (TEE) (N/A) ENDOVEIN HARVEST OF GREATER SAPHENOUS VEIN (Bilateral)   Subjective:  Patient developed N/V again overnight.  He states he ate thanksgiving meal with his wife and then the nurse gave him medicine in his IV and he started to throw up.  Remains nauseated this morning.  Objective: Vital signs in last 24 hours: Temp:  [97.6 F (36.4 C)-98.6 F (37 C)] 98.1 F (36.7 C) (11/26 0400) Pulse Rate:  [74-123] 80 (11/26 2992) Cardiac Rhythm: Atrial flutter (11/26 0400) Resp:  [15-33] 17 (11/26 4268) BP: (91-127)/(61-93) 94/62 (11/26 3419) SpO2:  [92 %-100 %] 96 % (11/26 6222) Weight:  [78.8 kg] 78.8 kg (11/26 0400)  Intake/Output from previous day: 11/25 0701 - 11/26 0700 In: 1429 [P.O.:30; I.V.:1399] Out: -   General appearance: alert, cooperative and no distress Heart: irregularly irregular rhythm Lungs: clear to auscultation bilaterally Abdomen: soft, non-tender; bowel sounds normal; no masses,  no organomegaly Extremities: edema trace Wound: clean and dry  Lab Results: Recent Labs    05/05/20 0500 05/06/20 0500  WBC 10.8* 9.5  HGB 10.5* 9.6*  HCT 31.8* 29.2*  PLT 397 387   BMET:  Recent Labs    05/05/20 0500 05/06/20 0500  NA 130* 132*  K 3.6 3.6  CL 92* 96*  CO2 25 22  GLUCOSE 107* 95  BUN 15 14  CREATININE 0.99 0.93  CALCIUM 9.0 8.4*    PT/INR:  Recent Labs    05/06/20 0500  LABPROT 16.9*  INR 1.4*   ABG    Component Value Date/Time   PHART 7.390 04/28/2020 2022   HCO3 20.6 04/28/2020 2022   TCO2  22 04/28/2020 2022   ACIDBASEDEF 4.0 (H) 04/28/2020 2022   O2SAT 46.7 05/01/2020 0840   CBG (last 3)  No results for input(s): GLUCAP in the last 72 hours.  Assessment/Plan: S/P Procedure(s) (LRB): CORONARY ARTERY BYPASS GRAFTING (CABG) TIMES FOUR USING LIMA to LAD; ENDOSCOPIC HARVESTED RIGHT GREATER SAPHENOUS VEIN AND MAZE PROCEDURE. (N/A) CLIPPING OF ATRIAL APPENDAGE USING ATRICUE 40 MM ATRICLIP EXCLUSION VLAA SYSTEM (Left) MAZE USING ATRICURE EMT1 ISOLATOR ABLATION SYSTEM (N/A) TRANSESOPHAGEAL ECHOCARDIOGRAM (TEE) (N/A) ENDOVEIN HARVEST OF GREATER SAPHENOUS VEIN (Bilateral)  1. CV- I/O A. Fib/Flutter- continue Lopressor, Digoxin, Cardizem drip for now.. if patient's nausea resolves will transition to oral cardizem for evening dose 2. INR 1.4, will increase dose to 5 mg daily 3. pulm- no acute issues, continue IS 4. Renal- creatinine WNL, weight is trending down, repeat labs in AM 5. GI- N/V again overnight, timing relates to phenergan administration, will stop, continue zofran prn... instructed patient to stick to bland foods, liquids to try and rest his digestive system... of note KUB obtained said possible ileus vs. Distal SBO... will repeat KUB today, if abnormal will get Gen surgery consult 6. Dispo- patient stable, continues to have A. Fib at times, continue cardizem drip for now, can hopefully transition to oral this evening once nausea is controlled, will increase coumadin, d/c EPW today, continue supportive care    Spoke  with Dr. Kipp Brood, repeat KUB appears worse with bowel dilation, will get CT AP with oral/IV contrast to assess for SBO   LOS: 11 days    Ellwood Handler, PA-C 05/06/2020 Pt seen and examined; agree with documentation. CT abd suggests enteritis. Continue supportive care.  Balthazar Dooly Z. Orvan Seen, [place]

## 2020-05-06 NOTE — Progress Notes (Signed)
Mobility Specialist - Progress Note   05/06/20 1420  Mobility  Activity Contraindicated/medical hold   Instructed by RN to hold mobility as pt will be getting his pacing wires removed soon.   Pricilla Handler Mobility Specialist Mobility Specialist Phone: [phone]

## 2020-05-06 NOTE — Progress Notes (Signed)
Pt complained of nausea and vomiting with clear yellowish emesis with persistent upper epigastric pain. Stated that his symptoms got worse after eating or drinking.   He got out of bed and tried to go to the bathroom. HR went up to 130-150 and he was fainted and almost passed out while walking back to his bed after had bowel movement. Needed 3 staff to help lifting him back to bed. HR continued to be at 130. We titrated Cardizem from 10 to 12.5 mg/hr. His BP remained stable. EKG 12 leads showed Atrial flutter with variable AV block and acute ST& T wave abnormality. Consider acute inferior infract and prolong QT interval.   Rapid response, Mindy notified and assessed Pt at bedside. Dr. Orvan Seen notified. Order received for LR 100 ml/hr. Phenergan given for N/V. Pt denied chest pain. No SOB, O2 NCL 2 LPM given for support, SPO2 99%. He remained afebrile.    Sternal incision and left and right leg EVH incision appeared dry and clean. We will continue to monitor.  Kennyth Lose, RN

## 2020-05-06 NOTE — Progress Notes (Signed)
Mobility Specialist - Progress Note   05/06/20 1131  Mobility  Activity Contraindicated/medical hold    Will attempt to see pt after CT.    Pricilla Handler Mobility Specialist Mobility Specialist Phone: [phone]

## 2020-05-06 NOTE — Progress Notes (Signed)
1102 Pt scheduled for CT after contrast consumed. Will hold ambulation at this time and follow up later as time permits. [name] RN BSN 05/06/2020 11:03 AM

## 2020-05-06 NOTE — Progress Notes (Addendum)
Pt had three consecutive systolic BP in 68'G. Cardizem stopped per order parameters.  PA notified, verbal order to titrate Cardizem to 5 ml/hr and continue monitoring.

## 2020-05-06 NOTE — Progress Notes (Signed)
Pt's epicardial pacing wires removed per MD order.  Pt tolerated well.  No complication to report at this time.  Pt is on bed rest with Q 10mins vitals per protocol.

## 2020-05-07 LAB — BASIC METABOLIC PANEL
Anion gap: 10 (ref 5–15)
BUN: 7 mg/dL — ABNORMAL LOW (ref 8–23)
CO2: 25 mmol/L (ref 22–32)
Calcium: 8.4 mg/dL — ABNORMAL LOW (ref 8.9–10.3)
Chloride: 98 mmol/L (ref 98–111)
Creatinine, Ser: 0.88 mg/dL (ref 0.61–1.24)
GFR, Estimated: >60 mL/min (ref >60)
Glucose, Bld: 132 mg/dL — ABNORMAL HIGH (ref 70–99)
Potassium: 3.5 mmol/L (ref 3.5–5.1)
Sodium: 133 mmol/L — ABNORMAL LOW (ref 135–145)

## 2020-05-07 LAB — CBC
HCT: 29.5 % — ABNORMAL LOW (ref 39.0–52.0)
Hemoglobin: 9.5 g/dL — ABNORMAL LOW (ref 13.0–17.0)
MCH: 31.3 pg (ref 26.0–34.0)
MCHC: 32.2 g/dL (ref 30.0–36.0)
MCV: 97 fL (ref 80.0–100.0)
Platelets: 392 10*3/uL (ref 150–400)
RBC: 3.04 MIL/uL — ABNORMAL LOW (ref 4.22–5.81)
RDW: 13.2 % (ref 11.5–15.5)
WBC: 10.2 10*3/uL (ref 4.0–10.5)
nRBC: 0 % (ref 0.0–0.2)

## 2020-05-07 LAB — PROTIME-INR
INR: 1.5 — ABNORMAL HIGH (ref 0.8–1.2)
Prothrombin Time: 17.6 seconds — ABNORMAL HIGH (ref 11.4–15.2)

## 2020-05-07 MED ORDER — DILTIAZEM HCL ER 60 MG PO CP12
120.0000 mg | ORAL_CAPSULE | Freq: Two times a day (BID) | ORAL | Status: DC
  Administered 2020-05-07 – 2020-05-11 (×9): 120 mg via ORAL
  Filled 2020-05-07 (×9): qty 2
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
## 2020-05-07 NOTE — Progress Notes (Addendum)
[address]       [place] [address]             [phone]      9 Days Post-Op Procedure(s) (LRB): CORONARY ARTERY BYPASS GRAFTING (CABG) TIMES FOUR USING LIMA to LAD; ENDOSCOPIC HARVESTED RIGHT GREATER SAPHENOUS VEIN AND MAZE PROCEDURE. (N/A) CLIPPING OF ATRIAL APPENDAGE USING ATRICUE 40 MM ATRICLIP EXCLUSION VLAA SYSTEM (Left) MAZE USING ATRICURE EMT1 ISOLATOR ABLATION SYSTEM (N/A) TRANSESOPHAGEAL ECHOCARDIOGRAM (TEE) (N/A) ENDOVEIN HARVEST OF GREATER SAPHENOUS VEIN (Bilateral)  Subjective:  Patient continues to feel poorly.  He continues to experience abdominal pain.  He states his moved his bowels last night and per nursing the patient was doubled over crying on the commode.  He remains nauseated at times.  Objective: Vital signs in last 24 hours: Temp:  [98.3 F (36.8 C)-98.7 F (37.1 C)] 98.7 F (37.1 C) (11/27 0404) Pulse Rate:  [75-104] 100 (11/26 2332) Cardiac Rhythm: Atrial fibrillation (11/26 1900) Resp:  [16-22] 19 (11/26 2332) BP: (90-107)/(57-80) 94/62 (11/27 0404) SpO2:  [92 %-99 %] 96 % (11/26 2332)  Intake/Output from previous day: 11/26 0701 - 11/27 0700 In: 110 [P.O.:110] Out: 1000 [Urine:1000]  General appearance: alert, cooperative and mild distress Heart: irregularly irregular rhythm Lungs: clear to auscultation bilaterally Abdomen: more distended this morning, hypoactive BS, tender to touch Extremities: edema none present Wound: clean and dry  Lab Results: Recent Labs    05/06/20 0500 05/07/20 0500  WBC 9.5 10.2  HGB 9.6* 9.5*  HCT 29.2* 29.5*  PLT 387 392   BMET:  Recent Labs    05/06/20 0500 05/07/20 0500  NA 132* 133*  K 3.6 3.5  CL 96* 98  CO2 22 25  GLUCOSE 95 132*  BUN 14 7*  CREATININE 0.93 0.88  CALCIUM 8.4* 8.4*    PT/INR:  Recent Labs    05/07/20 0500  LABPROT 17.6*  INR 1.5*   ABG    Component Value Date/Time   PHART 7.390 04/28/2020 2022   HCO3 20.6 04/28/2020 2022   TCO2 22  04/28/2020 2022   ACIDBASEDEF 4.0 (H) 04/28/2020 2022   O2SAT 46.7 05/01/2020 0840   CBG (last 3)  No results for input(s): GLUCAP in the last 72 hours.  Assessment/Plan: S/P Procedure(s) (LRB): CORONARY ARTERY BYPASS GRAFTING (CABG) TIMES FOUR USING LIMA to LAD; ENDOSCOPIC HARVESTED RIGHT GREATER SAPHENOUS VEIN AND MAZE PROCEDURE. (N/A) CLIPPING OF ATRIAL APPENDAGE USING ATRICUE 40 MM ATRICLIP EXCLUSION VLAA SYSTEM (Left) MAZE USING ATRICURE EMT1 ISOLATOR ABLATION SYSTEM (N/A) TRANSESOPHAGEAL ECHOCARDIOGRAM (TEE) (N/A) ENDOVEIN HARVEST OF GREATER SAPHENOUS VEIN (Bilateral)  1. CV- I/O A. Fib/NSR- will transition to oral Cardizem, Digoxin, Midodrine for hypotension, INR 1.5, continue coumadin at current regimen 2. Pulm- no acute issues, off oxygen continue IS 3. Renal- creatinine WNL, BUN now low, suspect dehydration with vomiting, diarrhea, on LR for hydration, will repeat labs in AM 4. GI-persistent GI complaints, + N/V at times, has moved bowels, CT scan yesterday showed no evidence of obstruction or ileus, but + gastritis.. patient on protonix, maalox prn..... Hgb remains stable patient now NPO accept for water and meds, will consult GI for further assistance 5. Dispo- patient with persistent GI complaints, have tried supportive measures without relief, will get GI consult, continue coumadin for A. Fib, repeat labs in AM   LOS: 12 days    Ellwood Handler, PA-C 05/07/2020 Pt seen and examined; agree with documentation. Continuing treatment for atrial fib. Zina Pitzer Z. Orvan Seen, MD  [phone] 

## 2020-05-07 NOTE — Progress Notes (Signed)
Mobility Specialist - Progress Note   05/07/20 1225  Mobility  Activity Ambulated in hall  Level of Assistance Standby assist, set-up cues, supervision of patient - no hands on  Assistive Device Four wheel walker  Distance Ambulated (ft) 810 ft  Mobility Response Tolerated well  Mobility performed by Mobility specialist  $Mobility charge 1 Mobility    Pre-mobility: 88 HR, 95/65 BP, 96% SpO2  Post-mobility: 86 HR, 130/84 BP, 98% SpO2  Pt denied any abd pain while ambulating, but did endorse mild, intermittent nausea. Pt in chair after walk w/ family member in room.   Pricilla Handler Mobility Specialist Mobility Specialist Phone: [phone]

## 2020-05-07 NOTE — Progress Notes (Signed)
Patient's HR changed to afib/aflutter @ 1940 per CCMD. Patient is asymptomatic. HR does escalate when patient sits up in the bed or sits on side of bed. Will continue to monitor

## 2020-05-07 NOTE — Progress Notes (Signed)
Notified Dr. Orvan Seen concerning patient's scheduled metoprolol and cardizem. b/p did not meet parameters for metoprolol. and HR elevated to 140-150 during movement.   Per verbal order from Dr. Orvan Seen, RN to give scheduled metoprolol and PO cardizem as well as 2.5 mg IV metoprolol. Orders read back to MD and carried out.   Will continue to monitor

## 2020-05-07 NOTE — Progress Notes (Signed)
CARDIAC REHAB PHASE I   PRE:  Rate/Rhythm: 109 afib, 80 SR    BP: sitting 102/71    SaO2: 93 RA  MODE:  Ambulation: 790 ft   POST:  Rate/Rhythm: 96 SR    BP: sitting 104/81     SaO2: 95 RA  Pt c/o upper abdomen discomfort. Willing to ambulate and pain did not change with position or ambulation. Pt able to walk entire hall with rollator, sat x1 for rest. VSS, converted to SR when he sat up before walk. Maintained SR during walk. To recliner, still c/o discomfort. Encouraged more walking and IS. [phone]   Darrick Meigs CES, ACSM 05/07/2020 11:04 AM

## 2020-05-08 ENCOUNTER — Inpatient Hospital Stay (HOSPITAL_COMMUNITY): Payer: Medicare Other

## 2020-05-08 DIAGNOSIS — R112 Nausea with vomiting, unspecified: Secondary | ICD-10-CM

## 2020-05-08 DIAGNOSIS — R140 Abdominal distension (gaseous): Secondary | ICD-10-CM | POA: Diagnosis not present

## 2020-05-08 DIAGNOSIS — K5909 Other constipation: Secondary | ICD-10-CM | POA: Diagnosis not present

## 2020-05-08 DIAGNOSIS — Z951 Presence of aortocoronary bypass graft: Secondary | ICD-10-CM | POA: Diagnosis not present

## 2020-05-08 DIAGNOSIS — R1110 Vomiting, unspecified: Secondary | ICD-10-CM

## 2020-05-08 LAB — BASIC METABOLIC PANEL
Anion gap: 11 (ref 5–15)
BUN: 10 mg/dL (ref 8–23)
CO2: 22 mmol/L (ref 22–32)
Calcium: 7.9 mg/dL — ABNORMAL LOW (ref 8.9–10.3)
Chloride: 99 mmol/L (ref 98–111)
Creatinine, Ser: 1.02 mg/dL (ref 0.61–1.24)
GFR, Estimated: >60 mL/min (ref >60)
Glucose, Bld: 105 mg/dL — ABNORMAL HIGH (ref 70–99)
Potassium: 4.4 mmol/L (ref 3.5–5.1)
Sodium: 132 mmol/L — ABNORMAL LOW (ref 135–145)

## 2020-05-08 LAB — CBC
HCT: 28.9 % — ABNORMAL LOW (ref 39.0–52.0)
Hemoglobin: 9.3 g/dL — ABNORMAL LOW (ref 13.0–17.0)
MCH: 31.3 pg (ref 26.0–34.0)
MCHC: 32.2 g/dL (ref 30.0–36.0)
MCV: 97.3 fL (ref 80.0–100.0)
Platelets: 427 10*3/uL — ABNORMAL HIGH (ref 150–400)
RBC: 2.97 MIL/uL — ABNORMAL LOW (ref 4.22–5.81)
RDW: 13.5 % (ref 11.5–15.5)
WBC: 13.8 10*3/uL — ABNORMAL HIGH (ref 4.0–10.5)
nRBC: 0 % (ref 0.0–0.2)

## 2020-05-08 LAB — PROTIME-INR
INR: 2.2 — ABNORMAL HIGH (ref 0.8–1.2)
Prothrombin Time: 23.4 seconds — ABNORMAL HIGH (ref 11.4–15.2)

## 2020-05-08 LAB — GLUCOSE, CAPILLARY: Glucose-Capillary: 84 mg/dL (ref 70–99)

## 2020-05-08 MED ORDER — ACETAMINOPHEN 160 MG/5ML PO SOLN: 650.0000 mg | Freq: Four times a day (QID) | ORAL | Status: DC | PRN

## 2020-05-08 MED ORDER — POTASSIUM CHLORIDE 10 MEQ/50ML IV SOLN
10.0000 meq | INTRAVENOUS | Status: AC
  Administered 2020-05-08 (×4): 10 meq via INTRAVENOUS
  Filled 2020-05-08 (×4): qty 50

## 2020-05-08 MED ORDER — WARFARIN SODIUM 2.5 MG PO TABS
2.5000 mg | ORAL_TABLET | Freq: Every day | ORAL | Status: DC
  Administered 2020-05-08 – 2020-05-09 (×2): 2.5 mg via ORAL
  Filled 2020-05-08 (×2): qty 1

## 2020-05-08 MED ORDER — PANTOPRAZOLE SODIUM 40 MG PO TBEC
40.0000 mg | DELAYED_RELEASE_TABLET | Freq: Two times a day (BID) | ORAL | Status: DC
  Administered 2020-05-08 – 2020-05-11 (×7): 40 mg via ORAL
  Filled 2020-05-08 (×7): qty 1

## 2020-05-08 MED ORDER — FAMOTIDINE 20 MG PO TABS
20.0000 mg | ORAL_TABLET | Freq: Every day | ORAL | Status: DC
  Administered 2020-05-08 – 2020-05-10 (×3): 20 mg via ORAL
  Filled 2020-05-08 (×3): qty 1

## 2020-05-08 MED ORDER — FLEET ENEMA 7-19 GM/118ML RE ENEM: 1.0000 | ENEMA | Freq: Every day | RECTAL | Status: DC | PRN

## 2020-05-08 NOTE — Plan of Care (Signed)
  Problem: Health Behavior/Discharge Planning: Goal: Ability to manage health-related needs will improve Outcome: Progressing   

## 2020-05-08 NOTE — Progress Notes (Signed)
Mobility Specialist - Progress Note   05/08/20 1239  Mobility  Activity Ambulated in hall  Level of Assistance Standby assist, set-up cues, supervision of patient - no hands on  Assistive Device Four wheel walker  Distance Ambulated (ft) 470 ft (263ft x 2)  Mobility Response Tolerated fair  Mobility performed by Mobility specialist  $Mobility charge 1 Mobility   Pre-mobility: 74 HR During mobility: 100 HR Post-mobility: 73 HR  Pt required one seated rest break due to weakness which he attributed to lack of sleep. He denied any dizziness, lightheadedness, or SOB. Pt in recliner after walk.   Pricilla Handler Mobility Specialist Mobility Specialist Phone: [phone]

## 2020-05-08 NOTE — Progress Notes (Addendum)
Guy Morris       [place] [address]             [phone]       10 Days Post-Op Procedure(s) (LRB): CORONARY ARTERY BYPASS GRAFTING (CABG) TIMES FOUR USING LIMA to LAD; ENDOSCOPIC HARVESTED RIGHT GREATER SAPHENOUS VEIN AND MAZE PROCEDURE. (N/A) CLIPPING OF ATRIAL APPENDAGE USING ATRICUE 40 MM ATRICLIP EXCLUSION VLAA SYSTEM (Left) MAZE USING ATRICURE EMT1 ISOLATOR ABLATION SYSTEM (N/A) TRANSESOPHAGEAL ECHOCARDIOGRAM (TEE) (N/A) ENDOVEIN HARVEST OF GREATER SAPHENOUS VEIN (Bilateral)  Subjective:  Patient had another episode of emesis early this morning.  He states he feels okay and feels a little better after he vomits.   The patient feels like he needs to move his bowels again.  He has issues with constipation at home.  Objective: Vital signs in last 24 hours: Temp:  [97.9 F (36.6 C)-98.8 F (37.1 C)] 97.9 F (36.6 C) (11/28 0744) Pulse Rate:  [60-131] 60 (11/28 0744) Cardiac Rhythm: Atrial fibrillation (11/27 1940) Resp:  [15-20] 17 (11/28 0744) BP: (83-102)/(57-71) 99/57 (11/28 0744) SpO2:  [92 %-96 %] 92 % (11/28 0744) Weight:  [78.6 kg] 78.6 kg (11/28 0453)  Intake/Output from previous day: 11/27 0701 - 11/28 0700 In: 812.2 [P.O.:110; I.V.:702.2] Out: 1700 [Urine:1300; Emesis/NG output:400]  General appearance: alert, cooperative and no distress Heart: regular rate and rhythm Lungs: clear to auscultation bilaterally Abdomen: + distention, hypoactive BS Extremities: edema trace Wound: clean and dry  Lab Results: Recent Labs    05/06/20 0500 05/07/20 0500  WBC 9.5 10.2  HGB 9.6* 9.5*  HCT 29.2* 29.5*  PLT 387 392   BMET:  Recent Labs    05/06/20 0500 05/07/20 0500  NA 132* 133*  K 3.6 3.5  CL 96* 98  CO2 22 25  GLUCOSE 95 132*  BUN 14 7*  CREATININE 0.93 0.88  CALCIUM 8.4* 8.4*    PT/INR:  Recent Labs    05/08/20 0422  LABPROT 23.4*  INR 2.2*   ABG    Component Value Date/Time   PHART 7.390 04/28/2020 2022    HCO3 20.6 04/28/2020 2022   TCO2 22 04/28/2020 2022   ACIDBASEDEF 4.0 (H) 04/28/2020 2022   O2SAT 46.7 05/01/2020 0840   CBG (last 3)  No results for input(s): GLUCAP in the last 72 hours.  Assessment/Plan: S/P Procedure(s) (LRB): CORONARY ARTERY BYPASS GRAFTING (CABG) TIMES FOUR USING LIMA to LAD; ENDOSCOPIC HARVESTED RIGHT GREATER SAPHENOUS VEIN AND MAZE PROCEDURE. (N/A) CLIPPING OF ATRIAL APPENDAGE USING ATRICUE 40 MM ATRICLIP EXCLUSION VLAA SYSTEM (Left) MAZE USING ATRICURE EMT1 ISOLATOR ABLATION SYSTEM (N/A) TRANSESOPHAGEAL ECHOCARDIOGRAM (TEE) (N/A) ENDOVEIN HARVEST OF GREATER SAPHENOUS VEIN (Bilateral)  1. CV- rate controlled A. Flutter this morning- continue Cardizem, Digoxin, Lopressor, and Midodrine 2. INR 2.2, big increase from 1.5, will decrease dose tonight as I suspect INR will increase further tomorrow.. goal is 2.0 3. Pulm- no acute issues, atelectasis on CXR, no pleural effusions 4. Renal- creatinine has been stable, K was 3.5 yesterday, with further vomiting will supplement K today, repeat BMET in AM 5. GI- patient again with vomiting overnight, appreciate GI consult, enema ordered as patient feels he may need to move his bowels, liquid diet for now 6. Dispo- patient in rate controlled A. Flutter today, biggest issues are GI in nature, continue supportive care, enema to help facilitate BM, INR is therapeutic will scale back coumadin this evening, repeat labs in AM   LOS: 13 days    Junie Panning  Barrett, PA-C 05/08/2020 Feeling better today after several BMs; afib/aflutter persistent  Martina Brodbeck Z. Orvan Seen, [place]

## 2020-05-08 NOTE — Progress Notes (Signed)
Patient had episode 400 ml bile, greenish/brown in color. Patient given zofran. Output recorded. Patient stated that he feels relief after he throws up.   Patient advised to only take clear liquids until MD rounds in morning. Will continue to monitor

## 2020-05-08 NOTE — Progress Notes (Addendum)
[facility] ROUNDING NOTE   Subjective: Again with nausea and bilious emesis this morning at 0 400.  No vomiting since then, and tolerated liquids for breakfast.  States he continues to have recurrence of epigastric pain and nausea/vomiting early in the morning, then feels better after emesis.  Small BM this morning.  Feels that his abdomen is distended, but nonpainful.   Objective: Vital signs in last 24 hours: Temp:  [97.9 F (36.6 C)-98.8 F (37.1 C)] 97.9 F (36.6 C) (11/28 1130) Pulse Rate:  [60-131] 75 (11/28 1130) Resp:  [16-20] 17 (11/28 1130) BP: (83-99)/(57-69) 94/59 (11/28 1130) SpO2:  [92 %-94 %] 92 % (11/28 1130) Weight:  [78.6 kg] 78.6 kg (11/28 0453) Last BM Date: 05/07/20 General: NAD Lungs:  CTA b/l, no w/r/r Heart:  RRR, no m/r/g Abdomen: Soft, NT, slightly distended (although my first time evaluating him), +BS Ext:  No c/c/e    Intake/Output from previous day: 11/27 0701 - 11/28 0700 In: 812.2 [P.O.:110; I.V.:702.2] Out: 1700 [Urine:1300; Emesis/NG output:400] Intake/Output this shift: No intake/output data recorded.   Lab Results: Recent Labs    05/06/20 0500 05/07/20 0500  WBC 9.5 10.2  HGB 9.6* 9.5*  PLT 387 392  MCV 95.7 97.0   BMET Recent Labs    05/06/20 0500 05/07/20 0500  NA 132* 133*  K 3.6 3.5  CL 96* 98  CO2 22 25  GLUCOSE 95 132*  BUN 14 7*  CREATININE 0.93 0.88  CALCIUM 8.4* 8.4*   LFT No results for input(s): PROT, ALBUMIN, AST, ALT, ALKPHOS, BILITOT, BILIDIR, IBILI in the last 72 hours. PT/INR Recent Labs    05/07/20 0500 05/08/20 0422  INR 1.5* 2.2*      Imaging/Other results: DG Chest 2 View  Result Date: 05/08/2020 CLINICAL DATA:  Status post coronary artery bypass grafting EXAM: CHEST - 2 VIEW COMPARISON:  May 03, 2020; chest CT May 06, 2020 FINDINGS: Central catheter tip is in the superior vena cava. No pneumothorax. There is a small left pleural effusion with atelectatic change in  the mid lung regions and left base. No consolidation. Heart is upper normal in size with pulmonary vascularity normal. Patient is status post coronary artery bypass grafting. Left atrial appendage clamp present. No adenopathy. Apparent prior trauma or surgery lateral right clavicle. IMPRESSION: No edema or consolidation. Areas of atelectatic change with small left pleural effusion. Stable cardiac silhouette. Electronically Signed   By: Lowella Grip III M.D.   On: 05/08/2020 09:28   CT CHEST ABDOMEN PELVIS W CONTRAST  Result Date: 05/06/2020 CLINICAL DATA:  Suspected bowel obstruction post CABG. EXAM: CT CHEST, ABDOMEN, AND PELVIS WITH CONTRAST TECHNIQUE: Multidetector CT imaging of the chest, abdomen and pelvis was performed following the standard protocol during bolus administration of intravenous contrast. CONTRAST:  173mL OMNIPAQUE IOHEXOL 300 MG/ML  SOLN COMPARISON:  Cardiac CT from April 18, 2020 CT abdomen and pelvis from April of 2021 FINDINGS: CT CHEST FINDINGS Cardiovascular: Post median sternotomy for coronary revascularization, not well assessed non gated assessment. Small amount of fluid anterior to the heart. Epicardial pacer wires are in place. Trace pericardial effusion. Signs of LEFT atrial appendage clipping. Aortic caliber is normal with signs of scattered atheromatous plaque. RIGHT-sided PICC line in place terminates in the distal superior vena cava. Central pulmonary vasculature is of normal caliber. Mediastinum/Nodes: Thoracic inlet structures are normal. Mild stranding about the sternum, upper margin and along the course of the sternum is following recent sternotomy. No axillary lymphadenopathy. No mediastinal  lymphadenopathy. Esophagus grossly normal. Lungs/Pleura: No sign of pneumothorax. Signs of calcified pleural plaques similar to recent cardiac CT. No lobar consolidation. Volume loss at the LEFT and RIGHT lung base in the setting of small effusions. Centrilobular pulmonary  emphysema. Emphysematous changes are moderate and worse at the lung apices. Musculoskeletal: Post median sternotomy. No acute musculoskeletal process related to the bony thorax. CT ABDOMEN PELVIS FINDINGS Hepatobiliary: Normal liver. No sign of suspicious lesion. Patent portal vein. No biliary duct dilation. No pericholecystic stranding. Pancreas: Normal Spleen: Normal Adrenals/Urinary Tract: Normal adrenal glands. Symmetric renal enhancement. No sign of hydronephrosis. No suspicious renal lesion. Stomach/Bowel: Mild distension of proximal small bowel loops. No transition point in the upper abdomen. Digested contrast material has passed into the colon. Mural stratification is noted with respect to the distal ileum best seen on image 105 of series 3. No perienteric stranding. The appendix is normal. Colonic diverticulosis. No pericolonic stranding or acute colonic process. Diverticular disease of the sigmoid colon is similar to previous imaging. Vascular/Lymphatic: Calcified and noncalcified atheromatous plaque in the nonaneurysmal abdominal aorta. This tracks into the iliac vessels. There is no gastrohepatic or hepatoduodenal ligament lymphadenopathy. No retroperitoneal or mesenteric lymphadenopathy. No pelvic sidewall lymphadenopathy. Reproductive: Prostate with mild heterogeneity, nonspecific finding. Other: No ascites. Musculoskeletal: Spinal degenerative changes. No acute or destructive bone process. IMPRESSION: 1. Post median sternotomy for coronary revascularization and LEFT atrial appendage clipping. Small amount of fluid anterior to the heart, likely postoperative without peripheral enhancement. Attention on follow-up and correlation with any clinical signs that would suggest infection. 2. Small effusions and basilar airspace disease likely volume loss seen on a background of pulmonary emphysema. 3. Signs of small bowel enteritis of distal small bowel without signs of obstruction. Normal appendix. 4. Colonic  diverticulosis without evidence of acute colonic process. 5. Emphysema and aortic atherosclerosis. Aortic Atherosclerosis (ICD10-I70.0) and Emphysema (ICD10-J43.9). Electronically Signed   By: Zetta Bills M.D.   On: 05/06/2020 14:15      Assessment and Plan:  1) Chronic constipation with possible postoperative ileus -History of chronic constipation, but likely superimposed postoperative ileus -Plan for enema today -Continue Colace and Dulcolax as prescribed -Abdominal x-ray -Continue ambulating around wards and OOBTC  2) Nausea/Vomiting 3) Epigastric pain -Continue current antiemetics -Increase Protonix to bid for the time being and add Pepcid 20 mg qhs for now -Holding at liquid diet -Depending on response to therapy, can also trial promotility agents both for nausea/vomiting and decreased BMs  -GI service will continue to follow    Lavena Bullion, DO  05/08/2020, 11:58 AM [facility] Pager [phone]

## 2020-05-08 NOTE — Plan of Care (Signed)
  Problem: Health Behavior/Discharge Planning: Goal: Ability to manage health-related needs will improve Outcome: Progressing   Problem: Clinical Measurements: Goal: Respiratory complications will improve Outcome: Progressing   

## 2020-05-08 NOTE — TOC Initial Note (Signed)
Transition of Care [facility]) - Initial/Assessment Note    Patient Details  Name: Guy Morris MRN: [id number] Date of Birth: 31-Jan-1951  Transition of Care [facility]) CM/SW Contact:    Carles Collet, RN Phone Number: 05/08/2020, 2:06 PM  Clinical Narrative:          Damaris Schooner w patient's wife at the bedside. Discussed needs for home after DC. They have RW, 3/1 at home, no new DME needs identified at this time. They would like [facility] services, discussed providers and ratings. [facility] accepted for [facility] services RN and PT. Other than fine hand motor skills their PT will cover all OT needs at DC and an LPN will cover HHA services.  No other CM needs at this time.          Expected Discharge Plan: [place] Barriers to Discharge: Continued Medical Work up   Patient Goals and CMS Choice Patient states their goals for this hospitalization and ongoing recovery are:: to return home CMS Medicare.gov Compare Post Acute Care list provided to:: Other (Comment Required) Choice offered to / list presented to : Spouse  Expected Discharge Plan and Services Expected Discharge Plan: [place]   Discharge Planning Services: CM Consult Post Acute Care Choice: [place] arrangements for the past 2 months: Single Family Home                           HH Arranged: PT, RN [place] Agency: [place] Date [place]: 05/08/20 Time HH Agency Contacted: 62 Representative spoke with at [place]: [place] Arrangements/Services Living arrangements for the past 2 months: [place] with:: Spouse                   Activities of Daily Living Home Assistive Devices/Equipment: Cane (specify quad or straight) ADL Screening (condition at time of admission) Patient's cognitive ability adequate to safely complete daily activities?: Yes Is the patient deaf or have difficulty hearing?: No Does the patient have difficulty seeing,  even when wearing glasses/contacts?: No Does the patient have difficulty concentrating, remembering, or making decisions?: No Patient able to express need for assistance with ADLs?: Yes Does the patient have difficulty dressing or bathing?: No Independently performs ADLs?: Yes (appropriate for developmental age) Does the patient have difficulty walking or climbing stairs?: No Weakness of Legs: Both Weakness of Arms/Hands: None  Permission Sought/Granted                  Emotional Assessment              Admission diagnosis:  Unstable angina ([place]) [I20.0] S/P CABG x 4 [Z95.1] Patient Active Problem List   Diagnosis Date Noted  . Abdominal distension   . Non-intractable vomiting   . Chronic constipation   . Coronary artery disease involving native heart without angina pectoris   . Hx of CABG 04/28/2020  . Unstable angina ([place]) 04/25/2020  . Paroxysmal atrial fibrillation ([place]) 03/27/2020  . Bilateral carotid artery stenosis 03/27/2020  . Vertigo 02/18/2020  . Left groin pain 08/26/2019  . Cervical spondylosis 08/13/2019  . Temporal pain 08/13/2019  . Balance disorder 05/13/2018  . Neck mass 05/13/2018  . General weakness 05/13/2018  . Diastolic dysfunction 32/20/2542  . Bradycardia 11/05/2017  . Hyperglycemia 11/05/2017  . Pain in lower jaw 05/08/2016  . Teeth grinding 05/08/2016  . Borderline systolic HTN 70/62/3762  .  Dyspnea 11/01/2015  . Chest pain 11/01/2015  . COPD (chronic obstructive pulmonary disease) ([place]) 11/01/2015  . Primary localized osteoarthritis of right knee 12/17/2014  . 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 Inguinal hernia 08/24/2014  . Asbestosis ([place]) 07/23/2014  . Postural dizziness 07/23/2014  . Visual disturbance 06/29/2014  . Chronic pain syndrome 10/27/2012  . Insomnia 04/28/2012  . BPH (benign prostatic hyperplasia) 04/28/2012  . Urge incontinence 04/28/2012  . Heart palpitations 04/28/2012  . Vision loss, right eye 10/22/2011   . Bladder neck obstruction 10/22/2011  . Degenerative joint disease 04/23/2011  . RLS (restless legs syndrome) 10/17/2010  . Encounter for long-term (current) use of high-risk medication 10/17/2010  . Preventative health care 10/15/2010  . WEIGHT LOSS 04/18/2010  . SHINGLES 08/22/2009  . HYPERSOMNIA 04/12/2009  . FREQUENCY, URINARY 04/12/2009  . ABNORMAL TRANSAMINASE-LFT'S 08/04/2008  . Personal history of colonic polyps 07/20/2008  . Hypothyroidism 05/06/2007  . Hyperlipidemia 05/06/2007  . ERECTILE DYSFUNCTION 05/06/2007  . Allergic rhinitis 05/06/2007  . GERD 05/06/2007  . OSTEOARTHRITIS, KNEE, 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  . LOW BACK PAIN 05/06/2007  . FATIGUE 05/06/2007   PCP:  Biagio Borg, MD Pharmacy:   [facility] [address], [place] - [place] [address] [place] [address] [place] [address] Phone: [phone] Fax: [phone]     Social Determinants of Health (SDOH) Interventions    Readmission Risk Interventions No flowsheet data found.

## 2020-05-09 DIAGNOSIS — R110 Nausea: Secondary | ICD-10-CM

## 2020-05-09 DIAGNOSIS — K567 Ileus, unspecified: Secondary | ICD-10-CM

## 2020-05-09 DIAGNOSIS — R1084 Generalized abdominal pain: Secondary | ICD-10-CM

## 2020-05-09 DIAGNOSIS — K9189 Other postprocedural complications and disorders of digestive system: Secondary | ICD-10-CM

## 2020-05-09 LAB — CBC
HCT: 28.5 % — ABNORMAL LOW (ref 39.0–52.0)
Hemoglobin: 9 g/dL — ABNORMAL LOW (ref 13.0–17.0)
MCH: 30.9 pg (ref 26.0–34.0)
MCHC: 31.6 g/dL (ref 30.0–36.0)
MCV: 97.9 fL (ref 80.0–100.0)
Platelets: 440 10*3/uL — ABNORMAL HIGH (ref 150–400)
RBC: 2.91 MIL/uL — ABNORMAL LOW (ref 4.22–5.81)
RDW: 13.6 % (ref 11.5–15.5)
WBC: 15.2 10*3/uL — ABNORMAL HIGH (ref 4.0–10.5)
nRBC: 0 % (ref 0.0–0.2)

## 2020-05-09 LAB — BASIC METABOLIC PANEL
Anion gap: 11 (ref 5–15)
BUN: 7 mg/dL — ABNORMAL LOW (ref 8–23)
CO2: 21 mmol/L — ABNORMAL LOW (ref 22–32)
Calcium: 8.1 mg/dL — ABNORMAL LOW (ref 8.9–10.3)
Chloride: 99 mmol/L (ref 98–111)
Creatinine, Ser: 0.84 mg/dL (ref 0.61–1.24)
GFR, Estimated: >60 mL/min (ref >60)
Glucose, Bld: 85 mg/dL (ref 70–99)
Potassium: 4.4 mmol/L (ref 3.5–5.1)
Sodium: 131 mmol/L — ABNORMAL LOW (ref 135–145)

## 2020-05-09 LAB — PROTIME-INR
INR: 3 — ABNORMAL HIGH (ref 0.8–1.2)
Prothrombin Time: 30.1 seconds — ABNORMAL HIGH (ref 11.4–15.2)

## 2020-05-09 MED ORDER — CALCIUM CARBONATE ANTACID 500 MG PO CHEW
1.0000 | CHEWABLE_TABLET | Freq: Three times a day (TID) | ORAL | Status: DC | PRN
  Administered 2020-05-09 (×2): 200 mg via ORAL
  Filled 2020-05-09 (×2): qty 1

## 2020-05-09 MED ORDER — SIMETHICONE 80 MG PO CHEW
80.0000 mg | CHEWABLE_TABLET | Freq: Four times a day (QID) | ORAL | Status: DC | PRN
  Administered 2020-05-09 – 2020-05-10 (×3): 80 mg via ORAL
  Filled 2020-05-09 (×3): qty 1

## 2020-05-09 MED ORDER — MIDODRINE HCL 5 MG PO TABS
5.0000 mg | ORAL_TABLET | Freq: Three times a day (TID) | ORAL | Status: DC
  Administered 2020-05-09 – 2020-05-11 (×7): 5 mg via ORAL
  Filled 2020-05-09 (×7): qty 1

## 2020-05-09 MED ORDER — POLYETHYLENE GLYCOL 3350 17 G PO PACK
17.0000 g | PACK | Freq: Two times a day (BID) | ORAL | Status: DC
  Administered 2020-05-09 – 2020-05-10 (×4): 17 g via ORAL
  Filled 2020-05-09 (×4): qty 1

## 2020-05-09 MED ORDER — DIGOXIN 125 MCG PO TABS
0.1250 mg | ORAL_TABLET | Freq: Every day | ORAL | Status: DC
  Administered 2020-05-09 – 2020-05-11 (×3): 0.125 mg via ORAL
  Filled 2020-05-09 (×3): qty 1

## 2020-05-09 NOTE — Progress Notes (Signed)
Mobility Specialist - Progress Note   05/09/20 1157  Mobility  Activity Ambulated in hall  Level of Assistance Standby assist, set-up cues, supervision of patient - no hands on  Assistive Device Four wheel walker  Distance Ambulated (ft) 730 ft (Intervals: 365 ft x 2)  Mobility Response Tolerated fair  Mobility performed by Mobility specialist  $Mobility charge 1 Mobility    Pre-mobility: 72 HR During mobility: 88 HR Post-mobility: 83 HR  Pt required one seated rest break due to feeling weak. He attributes the weakness to lack of sleep. Otherwise asx. Pt in bed after walk at his request.   Pricilla Handler Mobility Specialist Mobility Specialist Phone: [phone]

## 2020-05-09 NOTE — Progress Notes (Signed)
[address]       [place] [address]             [phone]      11 Days Post-Op Procedure(s) (LRB): CORONARY ARTERY BYPASS GRAFTING (CABG) TIMES FOUR USING LIMA to LAD; ENDOSCOPIC HARVESTED RIGHT GREATER SAPHENOUS VEIN AND MAZE PROCEDURE. (N/A) CLIPPING OF ATRIAL APPENDAGE USING ATRICUE 40 MM ATRICLIP EXCLUSION VLAA SYSTEM (Left) MAZE USING ATRICURE EMT1 ISOLATOR ABLATION SYSTEM (N/A) TRANSESOPHAGEAL ECHOCARDIOGRAM (TEE) (N/A) ENDOVEIN HARVEST OF GREATER SAPHENOUS VEIN (Bilateral)   Subjective:  Patient has vomited anymore.  He still feels like he has bad indigestion.  He thinks the Maalox aids in making him throw up.  He is requesting chewable antacid.  He moved his bowels yesterday w/o use of enema.  Objective: Vital signs in last 24 hours: Temp:  [97.8 F (36.6 C)-98.7 F (37.1 C)] 97.8 F (36.6 C) (11/28 2317) Pulse Rate:  [60-91] 82 (11/28 2317) Cardiac Rhythm: Normal sinus rhythm (11/28 2200) Resp:  [17-18] 18 (11/28 1644) BP: (94-116)/(57-75) 116/72 (11/28 2317) SpO2:  [92 %-98 %] 95 % (11/28 2317) Weight:  [82.6 kg] 82.6 kg (11/29 0500)  Intake/Output from previous day: 11/28 0701 - 11/29 0700 In: 2688 [P.O.:240; I.V.:2248; IV Piggyback:200] Out: -   General appearance: alert, cooperative and no distress Heart: regular rate and rhythm Lungs: clear to auscultation bilaterally Abdomen: soft, + distended, hypoactive BS Extremities: edema trace Wound: clean and dry  Lab Results: Recent Labs    05/08/20 1205 05/09/20 0336  WBC 13.8* 15.2*  HGB 9.3* 9.0*  HCT 28.9* 28.5*  PLT 427* 440*   BMET:  Recent Labs    05/08/20 1453 05/09/20 0336  NA 132* 131*  K 4.4 4.4  CL 99 99  CO2 22 21*  GLUCOSE 105* 85  BUN 10 7*  CREATININE 1.02 0.84  CALCIUM 7.9* 8.1*    PT/INR:  Recent Labs    05/09/20 0336  LABPROT 30.1*  INR 3.0*   ABG    Component Value Date/Time   PHART 7.390 04/28/2020 2022   HCO3 20.6 04/28/2020 2022    TCO2 22 04/28/2020 2022   ACIDBASEDEF 4.0 (H) 04/28/2020 2022   O2SAT 46.7 05/01/2020 0840   CBG (last 3)  Recent Labs    05/08/20 1127  GLUCAP 84    Assessment/Plan: S/P Procedure(s) (LRB): CORONARY ARTERY BYPASS GRAFTING (CABG) TIMES FOUR USING LIMA to LAD; ENDOSCOPIC HARVESTED RIGHT GREATER SAPHENOUS VEIN AND MAZE PROCEDURE. (N/A) CLIPPING OF ATRIAL APPENDAGE USING ATRICUE 40 MM ATRICLIP EXCLUSION VLAA SYSTEM (Left) MAZE USING ATRICURE EMT1 ISOLATOR ABLATION SYSTEM (N/A) TRANSESOPHAGEAL ECHOCARDIOGRAM (TEE) (N/A) ENDOVEIN HARVEST OF GREATER SAPHENOUS VEIN (Bilateral)  1. CV- Previous A. Fib, currently NSR- on Cardizem 120 mg BID, Digoxin, Lopressor, and Midodrine can hopefully decrease dose prior to discharge 2. INR 3.0, decreased coumadin dose last night to 2.5 mg daily, suspect INR will decrease will repeat coumadin at 2.5 mg daily.. patient will likely require alternating dosing 3. Pulm- no acute issues, continue IS 4. Renal- creatinine, lytes okay 5. GI- no vomiting since early yesterday morning, abdominal pain/indigestion persists.. have add TUMS, Mylicon per patient request, continue clear liquids as tolerated, will decrease IV fluids to 50 ml/hr... moving bowels, continue supportive care, GI following 6. Dispo- patient stable from surgery standpoint, in NSR currently, INR is therapeutic.. biggest issues are GI complaints... patient will be ready for d/c once these issues, stabilize and patients diet is tolerated   LOS: 14 days  Ellwood Handler, PA-C 05/09/2020

## 2020-05-09 NOTE — Progress Notes (Signed)
CARDIAC REHAB PHASE I   PRE:  Rate/Rhythm: 77 SR  BP:  Sitting: 113/65      SaO2: 95 RA  MODE:  Ambulation: 470 ft   POST:  Rate/Rhythm: 102 ST  BP:  Sitting: 117/69    SaO2: 94 RA   Pt ambulated 416ft in hallway standby assist with rollator. Pt denies pain or dizziness. Visible SOB upon return to room, coached through purse lipped breathing. Sats maintained on RA. Pt returned to recliner. Call bell and bedside table within reach. Encouraged nutrition, IS use, and walks. Will continue to follow.  [id number] Rufina Falco, RN BSN 05/09/2020 8:39 AM

## 2020-05-09 NOTE — Progress Notes (Signed)
Assisted patient to restroom. Patient with large loose/watery BM in toilet. Shaketa Serafin, Bettina Gavia RN

## 2020-05-09 NOTE — Progress Notes (Signed)
Progress Note   Subjective  Chief Complaint: Constipation, nausea vomiting and epigastric pain  No further vomiting per the patient this morning, but continues to feel full up with "gas".  Tells me that he really cannot eat anything because he still feels tight.  Does describe getting up and walking around the halls this morning but "it did not help".  Did have a successful pretty large bowel movement yesterday after enema.   Objective   Vital signs in last 24 hours: Temp:  [97.8 F (36.6 C)-98.7 F (37.1 C)] 97.8 F (36.6 C) (11/28 2317) Pulse Rate:  [73-82] 77 (11/29 1000) Resp:  [17-18] 18 (11/29 0748) BP: (94-117)/(59-75) 117/69 (11/29 0826) SpO2:  [92 %-98 %] 95 % (11/29 0748) Weight:  [82.6 kg] 82.6 kg (11/29 0500) Last BM Date: 05/08/20 (per patient report) General:    white male in NAD Heart:  Regular rate and rhythm; no murmurs Lungs: Respirations even and unlabored, lungs CTA bilaterally Abdomen: Tight, mild generalized TTP, moderate distention, decreased bowel sounds all 4 quadrants Extremities:  Without edema. Psych:  Cooperative. Normal mood and affect.  Intake/Output from previous day: 11/28 0701 - 11/29 0700 In: 2688 [P.O.:240; I.V.:2248; IV Piggyback:200] Out: -  Intake/Output this shift: Total I/O In: -  Out: 400 [Urine:400]  Lab Results: Recent Labs    05/07/20 0500 05/08/20 1205 05/09/20 0336  WBC 10.2 13.8* 15.2*  HGB 9.5* 9.3* 9.0*  HCT 29.5* 28.9* 28.5*  PLT 392 427* 440*   BMET Recent Labs    05/07/20 0500 05/08/20 1453 05/09/20 0336  NA 133* 132* 131*  K 3.5 4.4 4.4  CL 98 99 99  CO2 25 22 21*  GLUCOSE 132* 105* 85  BUN 7* 10 7*  CREATININE 0.88 1.02 0.84  CALCIUM 8.4* 7.9* 8.1*   PT/INR Recent Labs    05/08/20 0422 05/09/20 0336  LABPROT 23.4* 30.1*  INR 2.2* 3.0*    Studies/Results: DG Chest 2 View  Result Date: 05/08/2020 CLINICAL DATA:  Status post coronary artery bypass grafting EXAM: CHEST - 2 VIEW  COMPARISON:  May 03, 2020; chest CT May 06, 2020 FINDINGS: Central catheter tip is in the superior vena cava. No pneumothorax. There is a small left pleural effusion with atelectatic change in the mid lung regions and left base. No consolidation. Heart is upper normal in size with pulmonary vascularity normal. Patient is status post coronary artery bypass grafting. Left atrial appendage clamp present. No adenopathy. Apparent prior trauma or surgery lateral right clavicle. IMPRESSION: No edema or consolidation. Areas of atelectatic change with small left pleural effusion. Stable cardiac silhouette. Electronically Signed   By: Lowella Grip III M.D.   On: 05/08/2020 09:28   DG Abd 1 View  Result Date: 05/08/2020 CLINICAL DATA:  Abdominal pain and distension EXAM: ABDOMEN - 1 VIEW COMPARISON:  May 06, 2020 FINDINGS: There is dilated small bowel in the mid abdomen. No air-fluid levels. No large bowel dilatation. Mild stool volume in colon. Small pleural effusion on each side with mild left base lung atelectasis. IMPRESSION: Loop of dilated bowel in mid abdomen. Question focal ileus versus enteritis. Bowel obstruction less likely. No free air. Small pleural effusions with left base atelectasis noted. Electronically Signed   By: Lowella Grip III M.D.   On: 05/08/2020 14:08    Assessment / Plan:   Assessment: 1.  Chronic constipation and possible postoperative ileus: History of chronic constipation, but likely superimposed postoperative ileus, enema yesterday was successful, continued on  Colace and Dulcolax, abdominal x-ray unrevealing as above 2.  Nausea and vomiting: No further this morning 3.  Epigastric pain: May be minimally improved overnight  Plan: 1.  Continue Protonix twice daily and Pepcid 20 mg nightly 2.  Continue liquid diet 3.  Added MiraLAX twice daily today 4.  Continued ambulate around the halls and get out of the bed to the chair 5.  Please await any further  recommendations from Dr. Rush Landmark later today  Thank you for kind consultation, we will continue to follow.    LOS: 14 days   Guy Morris  05/09/2020, 10:43 AM

## 2020-05-09 NOTE — Progress Notes (Signed)
Patient ambulated in hallway 470 feet with rolling walker and nursing staff gait steady. Back in room. Patient still with complaints of gas in stomach. Assisted to chair. Call bell within reach.Riad Wagley, Bettina Gavia RN

## 2020-05-10 ENCOUNTER — Inpatient Hospital Stay (HOSPITAL_COMMUNITY): Payer: Medicare Other

## 2020-05-10 DIAGNOSIS — R933 Abnormal findings on diagnostic imaging of other parts of digestive tract: Secondary | ICD-10-CM

## 2020-05-10 LAB — CBC
HCT: 29.4 % — ABNORMAL LOW (ref 39.0–52.0)
Hemoglobin: 9.4 g/dL — ABNORMAL LOW (ref 13.0–17.0)
MCH: 31.2 pg (ref 26.0–34.0)
MCHC: 32 g/dL (ref 30.0–36.0)
MCV: 97.7 fL (ref 80.0–100.0)
Platelets: 488 10*3/uL — ABNORMAL HIGH (ref 150–400)
RBC: 3.01 MIL/uL — ABNORMAL LOW (ref 4.22–5.81)
RDW: 13.6 % (ref 11.5–15.5)
WBC: 10.4 10*3/uL (ref 4.0–10.5)
nRBC: 0 % (ref 0.0–0.2)

## 2020-05-10 LAB — BASIC METABOLIC PANEL
Anion gap: 10 (ref 5–15)
BUN: 6 mg/dL — ABNORMAL LOW (ref 8–23)
CO2: 22 mmol/L (ref 22–32)
Calcium: 7.8 mg/dL — ABNORMAL LOW (ref 8.9–10.3)
Chloride: 97 mmol/L — ABNORMAL LOW (ref 98–111)
Creatinine, Ser: 0.9 mg/dL (ref 0.61–1.24)
GFR, Estimated: >60 mL/min (ref >60)
Glucose, Bld: 79 mg/dL (ref 70–99)
Potassium: 3.8 mmol/L (ref 3.5–5.1)
Sodium: 129 mmol/L — ABNORMAL LOW (ref 135–145)

## 2020-05-10 LAB — MAGNESIUM: Magnesium: 1.8 mg/dL (ref 1.7–2.4)

## 2020-05-10 LAB — PROTIME-INR
INR: 3.4 — ABNORMAL HIGH (ref 0.8–1.2)
Prothrombin Time: 32.9 seconds — ABNORMAL HIGH (ref 11.4–15.2)

## 2020-05-10 LAB — PHOSPHORUS: Phosphorus: 1.9 mg/dL — ABNORMAL LOW (ref 2.5–4.6)

## 2020-05-10 MED ORDER — ADULT MULTIVITAMIN W/MINERALS CH
1.0000 | ORAL_TABLET | Freq: Every day | ORAL | Status: DC
  Administered 2020-05-11: 1 via ORAL
  Filled 2020-05-10: qty 1

## 2020-05-10 MED ORDER — PROSOURCE PLUS PO LIQD
30.0000 mL | Freq: Two times a day (BID) | ORAL | Status: DC
  Administered 2020-05-11: 30 mL via ORAL
  Filled 2020-05-10: qty 30

## 2020-05-10 MED ORDER — DICYCLOMINE HCL 10 MG PO CAPS
10.0000 mg | ORAL_CAPSULE | Freq: Four times a day (QID) | ORAL | Status: DC
  Administered 2020-05-10 – 2020-05-11 (×5): 10 mg via ORAL
  Filled 2020-05-10 (×6): qty 1

## 2020-05-10 MED ORDER — BOOST / RESOURCE BREEZE PO LIQD CUSTOM
1.0000 | Freq: Two times a day (BID) | ORAL | Status: DC
  Administered 2020-05-11: 1 via ORAL

## 2020-05-10 MED ORDER — WARFARIN SODIUM 1 MG PO TABS
1.0000 mg | ORAL_TABLET | Freq: Every day | ORAL | Status: DC
  Administered 2020-05-10: 1 mg via ORAL
  Filled 2020-05-10: qty 1

## 2020-05-10 MED ORDER — POLYETHYLENE GLYCOL 3350 17 G PO PACK
17.0000 g | PACK | Freq: Every day | ORAL | Status: DC
  Administered 2020-05-11: 17 g via ORAL
  Filled 2020-05-10: qty 1

## 2020-05-10 NOTE — Progress Notes (Signed)
CARDIAC REHAB PHASE I   PRE:  Rate/Rhythm: 77 SR  BP:  Supine:   Sitting: 95/63  Standing:    SaO2: 93%RA  MODE:  Ambulation: 600 ft   POST:  Rate/Rhythm: 94 SR  BP:  Supine:   Sitting: 113/61  Standing:    SaO2: 96%RA 0920-0945 Pt walked 600 ft on RA with rolling walker and asst x 1. Tolerated well. Stated felt good to walk.  Pt stated he is not tolerating clear liquids as too sweet. To recliner with call bell.    [name], RN BSN  05/10/2020 9:40 AM

## 2020-05-10 NOTE — Progress Notes (Signed)
Mobility Specialist: Progress Note   05/10/20 1649  Mobility  Activity Ambulated in hall  Level of [place] wheel walker  Distance Ambulated (ft) 470 ft  Mobility Response Tolerated well  Mobility performed by Mobility specialist  $Mobility charge 1 Mobility   Pre-Mobility: 81 HR Post-Mobility: 89 HR, 100% SpO2  Pt asx during ambulation and is hopeful for discharge tomorrow.   [facility] Tess Potts Mobility Specialist

## 2020-05-10 NOTE — Plan of Care (Signed)
  Problem: Clinical Measurements: Goal: Will remain free from infection Outcome: Progressing Goal: Respiratory complications will improve Outcome: Progressing Goal: Cardiovascular complication will be avoided Outcome: Progressing   

## 2020-05-10 NOTE — Progress Notes (Signed)
Daily Rounding Note  05/10/2020, 10:21 AM  LOS: 15 days   SUBJECTIVE:   Chief complaint:     Progressing w ambulation, PT. No nausea for last 2 d.  2 brown stools yesterday, one was more soft, runny.  The second 1 was loose with some small balls of formed stools.  BM this AM solid and loose, brown.  No signif abd pain, just sore muscles from earlier vomiting.  Clear liquids too sweet.  Patient really wants to go home and feels ready to eat solid food.  OBJECTIVE:         Vital signs in last 24 hours:    Temp:  [97.4 F (36.3 C)-98.2 F (36.8 C)] 97.5 F (36.4 C) (11/30 0831) Pulse Rate:  [49-93] 74 (11/30 0831) Resp:  [13-19] 15 (11/30 0831) BP: (97-121)/(59-72) 109/59 (11/30 0831) SpO2:  [91 %-98 %] 98 % (11/30 0831) Weight:  [80.9 kg] 80.9 kg (11/30 0432) Last BM Date: 05/09/20 Filed Weights   05/08/20 0453 05/09/20 0500 05/10/20 0432  Weight: 78.6 kg 82.6 kg 80.9 kg   General: Looks well Heart: RRR Chest: Excellent breath sounds, clear bilaterally.  No cough or labored breathing Abdomen: Slight distention but soft with bowel sounds present.  No tenderness. Extremities: No CCE Neuro/Psych: Alert.  Oriented x3.  In good spirits.  Anxious to get out of the hospital.  Intake/Output from previous day: 11/29 0701 - 11/30 0700 In: 1574.1 [I.V.:1574.1] Out: 1850 [Urine:1850]  Intake/Output this shift: Total I/O In: 3 [I.V.:3] Out: -   Lab Results: Recent Labs    05/08/20 1205 05/09/20 0336 05/10/20 0441  WBC 13.8* 15.2* 10.4  HGB 9.3* 9.0* 9.4*  HCT 28.9* 28.5* 29.4*  PLT 427* 440* 488*   BMET Recent Labs    05/08/20 1453 05/09/20 0336 05/10/20 0441  NA 132* 131* 129*  K 4.4 4.4 3.8  CL 99 99 97*  CO2 22 21* 22  GLUCOSE 105* 85 79  BUN 10 7* 6*  CREATININE 1.02 0.84 0.90  CALCIUM 7.9* 8.1* 7.8*   LFT No results for input(s): PROT, ALBUMIN, AST, ALT, ALKPHOS, BILITOT, BILIDIR, IBILI in the  last 72 hours. PT/INR Recent Labs    05/09/20 0336 05/10/20 0441  LABPROT 30.1* 32.9*  INR 3.0* 3.4*   Hepatitis Panel No results for input(s): HEPBSAG, HCVAB, HEPAIGM, HEPBIGM in the last 72 hours.  Studies/Results: DG Abd 1 View  Result Date: 05/08/2020 CLINICAL DATA:  Abdominal pain and distension EXAM: ABDOMEN - 1 VIEW COMPARISON:  May 06, 2020 FINDINGS: There is dilated small bowel in the mid abdomen. No air-fluid levels. No large bowel dilatation. Mild stool volume in colon. Small pleural effusion on each side with mild left base lung atelectasis. IMPRESSION: Loop of dilated bowel in mid abdomen. Question focal ileus versus enteritis. Bowel obstruction less likely. No free air. Small pleural effusions with left base atelectasis noted. Electronically Signed   By: Lowella Grip III M.D.   On: 05/08/2020 14:08   DG Abd 2 Views  Result Date: 05/10/2020 CLINICAL DATA:  Ileus EXAM: ABDOMEN - 2 VIEW COMPARISON:  Two days ago FINDINGS: Worsening bowel gas pattern with dilated small bowel loops the central abdomen. Gas is seen within the colon to the level of the rectum, but there is less distention than at the small bowel. Retrocardiac atelectasis.  No visible pneumoperitoneum IMPRESSION: Worsening small bowel dilatation suggesting obstruction. Electronically Signed   By: Neva Seat.D.  On: 05/10/2020 06:43   Scheduled Meds: . aspirin EC  81 mg Oral Daily  . bisacodyl  10 mg Oral Daily   Or  . bisacodyl  10 mg Rectal Daily  . chlorhexidine  15 mL Mouth Rinse BID  . Chlorhexidine Gluconate Cloth  6 each Topical Daily  . dicyclomine  10 mg Oral QID  . digoxin  0.125 mg Oral Daily  . diltiazem  120 mg Oral Q12H  . docusate sodium  200 mg Oral Daily  . ezetimibe  10 mg Oral Daily  . famotidine  20 mg Oral QHS  . feeding supplement  237 mL Oral TID WC  . levothyroxine  150 mcg Oral Daily  . mouth rinse  15 mL Mouth Rinse q12n4p  . metoprolol tartrate  12.5 mg Oral BID   . midodrine  5 mg Oral TID WC  . pantoprazole  40 mg Oral BID  . polyethylene glycol  17 g Oral BID  . sodium chloride flush  10-40 mL Intracatheter Q12H  . sodium chloride flush  3 mL Intravenous Q12H  . warfarin  1 mg Oral q1600  . Warfarin - Physician Dosing Inpatient   Does not apply q1600   Continuous Infusions: . sodium chloride 10 mL/hr at 05/06/20 0457  . lactated ringers 50 mL/hr at 05/09/20 1735   PRN Meds:.acetaminophen (TYLENOL) oral liquid 160 mg/5 mL, calcium carbonate, hydrOXYzine, meclizine, metoprolol tartrate, ondansetron (ZOFRAN) IV, simethicone, sodium chloride flush, sodium chloride flush, sodium phosphate  ASSESMENT:   *    Postoperative ileus superimposed on chronic constipation. Had ileus following colonoscopy in 2010. Mag, K levels ok, phos level low at 1.9 (rr 2.9 - 4/6)  *   Normocytic anemia.  Hgb stable in the 9-9.5 range.  MCV WNL.  *    A. fib following 04/28/2020 CABG.  Warfarin in place.   INR 3.4.  *    Hyponatremia.  *     GERD. Bid Protonix 40 mg, at bedtime Pepcid chronically.  *   Hx Adenomatous colon polyps.  Multiple adenomatous polyps 2009 (1 TA w foci of HGD), 2010, 2015.  Latest colonoscopy 01/2017 showing left-sided diverticulosis, 2 mm polyp (TA w/o  HGD) at descending colon.   PLAN   *   Despite this morning's abdominal films showing worsening of BGP, clinically the patient is doing quite well and he had a good sized bowel movement after the x-ray. Proceeding to advance him to a heart healthy diet.  Had spoken to him about starting MiraLAX but will hold off on this for now.  Normally this is quite effective for him when he is at home.    Azucena Freed  05/10/2020, 10:21 AM Phone [phone]

## 2020-05-10 NOTE — Progress Notes (Addendum)
[address]       [place] [address]             [phone]      12 Days Post-Op Procedure(s) (LRB): CORONARY ARTERY BYPASS GRAFTING (CABG) TIMES FOUR USING LIMA to LAD; ENDOSCOPIC HARVESTED RIGHT GREATER SAPHENOUS VEIN AND MAZE PROCEDURE. (N/A) CLIPPING OF ATRIAL APPENDAGE USING ATRICUE 40 MM ATRICLIP EXCLUSION VLAA SYSTEM (Left) MAZE USING ATRICURE EMT1 ISOLATOR ABLATION SYSTEM (N/A) TRANSESOPHAGEAL ECHOCARDIOGRAM (TEE) (N/A) ENDOVEIN HARVEST OF GREATER SAPHENOUS VEIN (Bilateral) Subjective: Mr. Gonzales insists he is feeling better. He has had no further nausea over the past 2 days. He said he had a "good" BM this morning with both solid and some lose stool. Denies abdominal pain but says he is still having some soreness he believes is related to the vomiting a few days ago.   Objective: Vital signs in last 24 hours: Temp:  [97.4 F (36.3 C)-98.2 F (36.8 C)] 97.8 F (36.6 C) (11/30 0432) Pulse Rate:  [49-93] 93 (11/30 0432) Cardiac Rhythm: Atrial flutter (11/29 1900) Resp:  [13-19] 19 (11/30 0432) BP: (97-121)/(65-72) 97/70 (11/30 0432) SpO2:  [91 %-97 %] 93 % (11/30 0432) Weight:  [80.9 kg] 80.9 kg (11/30 0432)     Intake/Output from previous day: 11/29 0701 - 11/30 0700 In: 1574.1 [I.V.:1574.1] Out: 1850 [Urine:1850] Intake/Output this shift: No intake/output data recorded.   General appearance: alert, cooperative and no distress Neurologic: intact Heart: irregularly irregular rhythm and monitor shows atrial flutter.  Lungs: Breath sounds are clear. Abdomen: Soft, mild tenderness, no guarding. Hyperactive bowel sounds.  Extremities: Well perfused, no edema.  Wound: the sternotomy and RLE EVH incisions are intact and dry healing with no sign of complication   Lab Results: Recent Labs    05/09/20 0336 05/10/20 0441  WBC 15.2* 10.4  HGB 9.0* 9.4*  HCT 28.5* 29.4*  PLT 440* 488*   BMET:  Recent Labs    05/09/20 0336 05/10/20 0441    NA 131* 129*  K 4.4 3.8  CL 99 97*  CO2 21* 22  GLUCOSE 85 79  BUN 7* 6*  CREATININE 0.84 0.90  CALCIUM 8.1* 7.8*    PT/INR:  Recent Labs    05/10/20 0441  LABPROT 32.9*  INR 3.4*   ABG    Component Value Date/Time   PHART 7.390 04/28/2020 2022   HCO3 20.6 04/28/2020 2022   TCO2 22 04/28/2020 2022   ACIDBASEDEF 4.0 (H) 04/28/2020 2022   O2SAT 46.7 05/01/2020 0840   CBG (last 3)  Recent Labs    05/08/20 1127  GLUCAP 84    Assessment/Plan: S/P Procedure(s) (LRB): CORONARY ARTERY BYPASS GRAFTING (CABG) TIMES FOUR USING LIMA to LAD; ENDOSCOPIC HARVESTED RIGHT GREATER SAPHENOUS VEIN AND MAZE PROCEDURE. (N/A) CLIPPING OF ATRIAL APPENDAGE USING ATRICUE 40 MM ATRICLIP EXCLUSION VLAA SYSTEM (Left) MAZE USING ATRICURE EMT1 ISOLATOR ABLATION SYSTEM (N/A) TRANSESOPHAGEAL ECHOCARDIOGRAM (TEE) (N/A) ENDOVEIN HARVEST OF GREATER SAPHENOUS VEIN (Bilateral)  -POD12 CABG x 4, MAZE, left atrial appendage clip. Stable cardiac status. Remains on midodrine 5mg  TID with SBP ~100-110. Progressing with mobility.   - Post-op nausea with vomiting- No further vomiting past 48 hours. He has had return of bowel function but abd films this morning read as "Worsening small bowel dilatation suggesting obstruction". GI considering CT of abdomen and pelvis.   -Chronic Atrial fibrillation / Flutter- rate controlled on digoxin, diltiazem, and metoprolol.  INR 3.4,  Coumadin dosed at 2.5mg  past 3 days.  Hold coumadin tonight.   -Disposition- plan for eventual discharge to home but nee to resolve abdominal issues and assure INR is controlled prior to discharge.     LOS: 15 days    Antony Odea, [place] [phone] 05/10/2020  patient examined and medical record reviewed,agree with above note.will need HHN at DC for coumadin draws and to monitor cardiac rhythm Tharon Aquas Trigt III 05/10/2020

## 2020-05-10 NOTE — Progress Notes (Addendum)
Initial Nutrition Assessment  DOCUMENTATION CODES:   Non-severe (moderate) malnutrition in context of acute illness/injury  INTERVENTION:   Boost Breeze po BID, each supplement provides 250 kcal and 9 grams of protein  PROsource Plus BID, each supplement provides 100 kcal and 15 grams of protein.   MVI with minerals   NUTRITION DIAGNOSIS:   Moderate Malnutrition related to acute illness (CABG, post op ileus) as evidenced by mild muscle depletion, mild fat depletion, energy intake < 75% for > 7 days.   GOAL:   Patient will meet greater than or equal to 90% of their needs   MONITOR:   PO intake, Supplement acceptance, Labs, Weight trends, Skin  REASON FOR ASSESSMENT:   LOS, NPO/Clear Liquid Diet    ASSESSMENT:   Pt with PMH of PAF, A fib, COPD, HLD, CAD. Pt with presented with ongoing jaw pain. 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 Had CABG and MAZE/Clip on 11/18.  Per chart review, pt experienced vomiting during admission but has not experienced this in 2 days. Pt previously experienced constipation but had bowel movement today. Pt states he is tired of the clear liquid diet and is excited to eat solid foods.   Spoke with pt and wife at bedside. States he is feeling better and is able to consume solid foods.  Pt states his appetite was good PTA. Typical intake: Breakfast: eggs and toast, Lunch: peanut butter and crackers, Dinner: meat, vegetable, starch plate.   Pt was able to consume some lunch today consisting of roast [place]. Pt stated he experienced early satiety and did not finish lunch completely. Wife stated she brought in sweet tea and chicken and dumplings earlier today and he has tolerated these items well. Stated he does not like Ensure and refuses to drink anymore. Pt agreeable to try other nutrition supplements to optimize calorie and protein intake.   Meal completions lack documentation.   Pt stated he feels general weakness but is wanting to  regain strengh. Pt attributes his weakness to his recent poor appetite consisting of liquids. Pt is unaware of any weight loss but said he assumes he has lost some during admission. States his UBW is around 180 lbs.  Unable to quantify weight loss during admission d/t possible fluid rentention.   Labs reviewed: Sodium 129, Chloride: 97   Medications reviewed and include: dolcolax, colace    NUTRITION - FOCUSED PHYSICAL EXAM:    Most Recent Value  Orbital Region Mild depletion  Upper Arm Region Moderate depletion  Thoracic and Lumbar Region Unable to assess  Buccal Region Mild depletion  Temple Region Mild depletion  Clavicle Bone Region Mild depletion  Clavicle and Acromion Bone Region Mild depletion  Scapular Bone Region Unable to assess  Dorsal Hand No depletion  Patellar Region Mild depletion  Anterior Thigh Region Unable to assess  Posterior Calf Region Mild depletion  Edema (RD Assessment) None  Hair Reviewed  Eyes Reviewed  Mouth Reviewed  Skin Reviewed  Nails Reviewed       Diet Order:   Diet Order            Diet Heart Room service appropriate? Yes; Fluid consistency: Thin  Diet effective now                 EDUCATION NEEDS:   Education needs have been addressed  Skin:  Skin Assessment: Reviewed RN Assessment  Last BM:  05/10/20  Height:   Ht Readings from Last 1 Encounters:  04/28/20 5\' 8"  (  1.727 m)    Weight:   Wt Readings from Last 1 Encounters:  05/10/20 80.9 kg    Ideal Body Weight:     BMI:  Body mass index is 27.12 kg/m.  Estimated Nutritional Needs:   Kcal:  2100-2300  Protein:  110-125 grams  Fluid:  >2 L/day    Ronnald Nian, Dietetic Intern Pager: [phone] If unavailable: [phone]

## 2020-05-11 DIAGNOSIS — E440 Moderate protein-calorie malnutrition: Secondary | ICD-10-CM | POA: Insufficient documentation

## 2020-05-11 LAB — BASIC METABOLIC PANEL
Anion gap: 12 (ref 5–15)
BUN: 5 mg/dL — ABNORMAL LOW (ref 8–23)
CO2: 23 mmol/L (ref 22–32)
Calcium: 8.1 mg/dL — ABNORMAL LOW (ref 8.9–10.3)
Chloride: 98 mmol/L (ref 98–111)
Creatinine, Ser: 0.81 mg/dL (ref 0.61–1.24)
GFR, Estimated: >60 mL/min (ref >60)
Glucose, Bld: 91 mg/dL (ref 70–99)
Potassium: 4.1 mmol/L (ref 3.5–5.1)
Sodium: 133 mmol/L — ABNORMAL LOW (ref 135–145)

## 2020-05-11 LAB — CBC
HCT: 29.1 % — ABNORMAL LOW (ref 39.0–52.0)
Hemoglobin: 9.1 g/dL — ABNORMAL LOW (ref 13.0–17.0)
MCH: 30.2 pg (ref 26.0–34.0)
MCHC: 31.3 g/dL (ref 30.0–36.0)
MCV: 96.7 fL (ref 80.0–100.0)
Platelets: 531 10*3/uL — ABNORMAL HIGH (ref 150–400)
RBC: 3.01 MIL/uL — ABNORMAL LOW (ref 4.22–5.81)
RDW: 13.5 % (ref 11.5–15.5)
WBC: 8.4 10*3/uL (ref 4.0–10.5)
nRBC: 0 % (ref 0.0–0.2)

## 2020-05-11 LAB — PROTIME-INR
INR: 3.2 — ABNORMAL HIGH (ref 0.8–1.2)
Prothrombin Time: 31.5 seconds — ABNORMAL HIGH (ref 11.4–15.2)

## 2020-05-11 MED ORDER — DILTIAZEM HCL ER 120 MG PO CP12
120.0000 mg | ORAL_CAPSULE | Freq: Two times a day (BID) | ORAL | 3 refills | Status: DC
Start: 2020-05-11 — End: 2020-08-01

## 2020-05-11 MED ORDER — DICYCLOMINE HCL 10 MG PO CAPS
10.0000 mg | ORAL_CAPSULE | Freq: Four times a day (QID) | ORAL | 1 refills | Status: DC
Start: 2020-05-11 — End: 2020-06-22

## 2020-05-11 MED ORDER — METOPROLOL TARTRATE 25 MG PO TABS
12.5000 mg | ORAL_TABLET | Freq: Two times a day (BID) | ORAL | 3 refills | Status: DC
Start: 2020-05-11 — End: 2020-08-01

## 2020-05-11 MED ORDER — K PHOS MONO-SOD PHOS DI & MONO 155-852-130 MG PO TABS
500.0000 mg | ORAL_TABLET | ORAL | Status: DC
  Administered 2020-05-11: 500 mg via ORAL
  Filled 2020-05-11 (×2): qty 2

## 2020-05-11 MED ORDER — HYDROXYZINE HCL 25 MG PO TABS: 25.0000 mg | ORAL_TABLET | Freq: Three times a day (TID) | ORAL | 0 refills | Status: DC | PRN

## 2020-05-11 MED ORDER — MIDODRINE HCL 5 MG PO TABS
5.0000 mg | ORAL_TABLET | Freq: Three times a day (TID) | ORAL | 0 refills | Status: DC
Start: 2020-05-11 — End: 2020-06-22

## 2020-05-11 MED ORDER — WARFARIN SODIUM 1 MG PO TABS
1.0000 mg | ORAL_TABLET | Freq: Every day | ORAL | 3 refills | Status: DC
Start: 2020-05-11 — End: 2020-06-23

## 2020-05-11 MED ORDER — DIGOXIN 125 MCG PO TABS
0.1250 mg | ORAL_TABLET | Freq: Every day | ORAL | 3 refills | Status: DC
Start: 2020-05-12 — End: 2020-05-23

## 2020-05-11 MED ORDER — POLYETHYLENE GLYCOL 3350 17 G PO PACK
17.0000 g | PACK | Freq: Every day | ORAL | 0 refills | Status: DC
Start: 2020-05-12 — End: 2023-03-04

## 2020-05-11 NOTE — Progress Notes (Addendum)
      [address]       [place] [address]             [phone]      13 Days Post-Op Procedure(s) (LRB): CORONARY ARTERY BYPASS GRAFTING (CABG) TIMES FOUR USING LIMA to LAD; ENDOSCOPIC HARVESTED RIGHT GREATER SAPHENOUS VEIN AND MAZE PROCEDURE. (N/A) CLIPPING OF ATRIAL APPENDAGE USING ATRICUE 40 MM ATRICLIP EXCLUSION VLAA SYSTEM (Left) MAZE USING ATRICURE EMT1 ISOLATOR ABLATION SYSTEM (N/A) TRANSESOPHAGEAL ECHOCARDIOGRAM (TEE) (N/A) ENDOVEIN HARVEST OF GREATER SAPHENOUS VEIN (Bilateral)   Subjective:  Patient without new complaints.  He is feeling much better, moved his bowels again yesterday.  No further vomiting  Objective: Vital signs in last 24 hours: Temp:  [97.5 F (36.4 C)-98.7 F (37.1 C)] 98.1 F (36.7 C) (12/01 0713) Pulse Rate:  [72-78] 72 (12/01 0713) Cardiac Rhythm: Normal sinus rhythm (11/30 1900) Resp:  [15-20] 19 (12/01 0713) BP: (102-127)/(59-74) 102/64 (12/01 0713) SpO2:  [93 %-98 %] 93 % (12/01 0713) Weight:  [80.9 kg] 80.9 kg (12/01 0359)  Intake/Output from previous day: 11/30 0701 - 12/01 0700 In: 723 [P.O.:720; I.V.:3] Out: 600 [Urine:600] Intake/Output this shift: Total I/O In: -  Out: 600 [Urine:600]  General appearance: alert, cooperative and no distress Heart: regular rate and rhythm Lungs: clear to auscultation bilaterally Abdomen: soft, non-tender; bowel sounds normal; no masses,  no organomegaly Extremities: extremities normal, atraumatic, no cyanosis or edema Wound: clean and dry  Lab Results: Recent Labs    05/09/20 0336 05/10/20 0441  WBC 15.2* 10.4  HGB 9.0* 9.4*  HCT 28.5* 29.4*  PLT 440* 488*   BMET:  Recent Labs    05/09/20 0336 05/10/20 0441  NA 131* 129*  K 4.4 3.8  CL 99 97*  CO2 21* 22  GLUCOSE 85 79  BUN 7* 6*  CREATININE 0.84 0.90  CALCIUM 8.1* 7.8*    PT/INR:  Recent Labs    05/11/20 0500  LABPROT 31.5*  INR 3.2*   ABG    Component Value Date/Time   PHART 7.390 04/28/2020  2022   HCO3 20.6 04/28/2020 2022   TCO2 22 04/28/2020 2022   ACIDBASEDEF 4.0 (H) 04/28/2020 2022   O2SAT 46.7 05/01/2020 0840   CBG (last 3)  Recent Labs    05/08/20 1127  GLUCAP 84    Assessment/Plan: S/P Procedure(s) (LRB): CORONARY ARTERY BYPASS GRAFTING (CABG) TIMES FOUR USING LIMA to LAD; ENDOSCOPIC HARVESTED RIGHT GREATER SAPHENOUS VEIN AND MAZE PROCEDURE. (N/A) CLIPPING OF ATRIAL APPENDAGE USING ATRICUE 40 MM ATRICLIP EXCLUSION VLAA SYSTEM (Left) MAZE USING ATRICURE EMT1 ISOLATOR ABLATION SYSTEM (N/A) TRANSESOPHAGEAL ECHOCARDIOGRAM (TEE) (N/A) ENDOVEIN HARVEST OF GREATER SAPHENOUS VEIN (Bilateral)  1. CV- H/O PAF, currently in NSR- continue Lopressor, Digoxin, Cardizem 2. INR 3.2, continue coumadin at 1 mg daily 3. Pulm- no acute issues, off oxygen 4. Renal- creatinine stable, weight is stable 5. GI- no further vomiting, has moved his bowels multiple times, overall he feels much better.. per GI recs continue Miralax  6. Dispo- patient stable, will check labs today, continue coumadin, H/H has been arranged.Marland Kitchen if patients labs look okay, will d/c home this afternoon   LOS: 16 days    Ellwood Handler, PA-C 05/11/2020 eating pancake with scrambled eggs nsr No abdominal complaints Plan DC home on coumadin with HHN to draw INR to Coumadin clinic

## 2020-05-11 NOTE — TOC Transition Note (Signed)
Transition of Care (TOC) - CM/SW Discharge Note Marvetta Gibbons RN, BSN Transitions of Care Unit 4E- RN Case Manager See Treatment Team for direct phone #    Patient Details  Name: Guy Morris MRN: [id number] Date of Birth: [birth year]  Transition of Care [facility]) CM/SW Contact:  Dawayne Patricia, RN Phone Number: 05/11/2020, 12:34 PM   Clinical Narrative:    Pt stable for transition home today, [place] set up with [facility] as per previous CM note- call made to Angie with [facility] to notify of transition home today- and need for INR check 12/2.  Family to transport home.    Final next level of care: [place] Barriers to Discharge: Barriers Resolved   Patient Goals and CMS Choice Patient states their goals for this hospitalization and ongoing recovery are:: to return home CMS Medicare.gov Compare Post Acute Care list provided to:: Other (Comment Required) Choice offered to / list presented to : Spouse  Discharge Placement                 Home with [facility]      Discharge Plan and Services   Discharge Planning Services: CM Consult Post Acute Care Choice: Home Health          DME Arranged: N/A DME Agency: NA       HH Arranged: PT, RN [place] Agency: [facility] Date [place]: 05/08/20 Time [facility]: [phone] Representative spoke with at [place]: Angie  Social Determinants of Health ([place]) Interventions     Readmission Risk Interventions No flowsheet data found.

## 2020-05-11 NOTE — Progress Notes (Signed)
[id number] Education completed with pt and wife who voiced understanding. Reviewed sternal precautions and staying in the tube (has handout). Gave walking instructions for exercise. Discussed wound care. Gave heart healthy diet and discussed watching dark green leafy vegetables with Coumadin. Encouraged IS. Discussed CRP 2 and referral to [facility]. [name] RN BSN 05/11/2020 10:59 AM

## 2020-05-11 NOTE — Progress Notes (Signed)
Discharge instructions given to patient. Medications and wound care reviewed. All questions answered. Pt escorted home with wife.  Arletta Bale, RN

## 2020-05-12 ENCOUNTER — Other Ambulatory Visit (HOSPITAL_COMMUNITY)
Admission: RE | Admit: 2020-05-12 | Discharge: 2020-05-12 | Disposition: A | Discharge status: Home / Self Care | Payer: Medicare Other | Source: Other Acute Inpatient Hospital

## 2020-05-12 DIAGNOSIS — Z48812 Encounter for surgical aftercare following surgery on the circulatory system: Secondary | ICD-10-CM | POA: Diagnosis not present

## 2020-05-12 DIAGNOSIS — I482 Chronic atrial fibrillation, unspecified: Secondary | ICD-10-CM | POA: Diagnosis not present

## 2020-05-12 DIAGNOSIS — Z7901 Long term (current) use of anticoagulants: Secondary | ICD-10-CM | POA: Diagnosis not present

## 2020-05-12 DIAGNOSIS — Z029 Encounter for administrative examinations, unspecified: Secondary | ICD-10-CM | POA: Insufficient documentation

## 2020-05-12 DIAGNOSIS — Z5181 Encounter for therapeutic drug level monitoring: Secondary | ICD-10-CM | POA: Diagnosis not present

## 2020-05-12 DIAGNOSIS — I2510 Atherosclerotic heart disease of native coronary artery without angina pectoris: Secondary | ICD-10-CM | POA: Diagnosis not present

## 2020-05-12 DIAGNOSIS — Z951 Presence of aortocoronary bypass graft: Secondary | ICD-10-CM | POA: Diagnosis not present

## 2020-05-12 LAB — PROTIME-INR
INR: 2.5 — ABNORMAL HIGH (ref 0.8–1.2)
Prothrombin Time: 26.1 seconds — ABNORMAL HIGH (ref 11.4–15.2)

## 2020-05-17 DIAGNOSIS — I2510 Atherosclerotic heart disease of native coronary artery without angina pectoris: Secondary | ICD-10-CM | POA: Diagnosis not present

## 2020-05-17 DIAGNOSIS — Z5181 Encounter for therapeutic drug level monitoring: Secondary | ICD-10-CM | POA: Diagnosis not present

## 2020-05-17 DIAGNOSIS — I482 Chronic atrial fibrillation, unspecified: Secondary | ICD-10-CM | POA: Diagnosis not present

## 2020-05-17 DIAGNOSIS — Z951 Presence of aortocoronary bypass graft: Secondary | ICD-10-CM | POA: Diagnosis not present

## 2020-05-17 DIAGNOSIS — Z7901 Long term (current) use of anticoagulants: Secondary | ICD-10-CM | POA: Diagnosis not present

## 2020-05-17 DIAGNOSIS — Z48812 Encounter for surgical aftercare following surgery on the circulatory system: Secondary | ICD-10-CM | POA: Diagnosis not present

## 2020-05-18 ENCOUNTER — Ambulatory Visit: Payer: Medicare Other | Admitting: Physician Assistant

## 2020-05-20 ENCOUNTER — Ambulatory Visit (INDEPENDENT_AMBULATORY_CARE_PROVIDER_SITE_OTHER): Payer: Medicare Other | Admitting: Cardiovascular Disease

## 2020-05-20 ENCOUNTER — Encounter: Payer: Self-pay | Admitting: Cardiothoracic Surgery

## 2020-05-20 ENCOUNTER — Telehealth: Payer: Self-pay | Admitting: Cardiology

## 2020-05-20 ENCOUNTER — Telehealth: Payer: Self-pay | Admitting: *Deleted

## 2020-05-20 DIAGNOSIS — Z5181 Encounter for therapeutic drug level monitoring: Secondary | ICD-10-CM | POA: Diagnosis not present

## 2020-05-20 DIAGNOSIS — I482 Chronic atrial fibrillation, unspecified: Secondary | ICD-10-CM | POA: Diagnosis not present

## 2020-05-20 DIAGNOSIS — Z951 Presence of aortocoronary bypass graft: Secondary | ICD-10-CM | POA: Diagnosis not present

## 2020-05-20 DIAGNOSIS — I824Y9 Acute embolism and thrombosis of unspecified deep veins of unspecified proximal lower extremity: Secondary | ICD-10-CM | POA: Insufficient documentation

## 2020-05-20 DIAGNOSIS — Z7901 Long term (current) use of anticoagulants: Secondary | ICD-10-CM | POA: Insufficient documentation

## 2020-05-20 DIAGNOSIS — Z48812 Encounter for surgical aftercare following surgery on the circulatory system: Secondary | ICD-10-CM | POA: Diagnosis not present

## 2020-05-20 DIAGNOSIS — I2510 Atherosclerotic heart disease of native coronary artery without angina pectoris: Secondary | ICD-10-CM | POA: Diagnosis not present

## 2020-05-20 LAB — POCT INR: INR: 1 — AB (ref 2.0–3.0)

## 2020-05-20 NOTE — Telephone Encounter (Signed)
Guy Morris, [facility] nurse with [facility], is calling to report patient's INR level is 1.0. if additional questions, please return call to Guy Morris to discuss at [phone].

## 2020-05-20 NOTE — Telephone Encounter (Signed)
Spoke to the patient who is willing to come in on 12/13 @ 10:30 to discuss Warfarin startup.

## 2020-05-20 NOTE — Telephone Encounter (Signed)
Message sent to [facility] for advice.

## 2020-05-20 NOTE — Telephone Encounter (Signed)
Spoke with Mr. Jenifer regarding vm his home health nurse Joelene Millin left. Attempted to call [facility] RN back without success, VM left for return call. Pt with c/o swelling of RLL extremity in [facility] leg. Pt states his RLL feels tight. Per pt there is a knot at his lower leg incision that a previous [place] RN marked. This knot has not grown since Monday when it was marked. Pt notes bruising up and down his leg. Pt states there is swelling but this has been consistent since d/c from surgery. Pt notes numbness in his right heel. Pt denies any drainage from incision, redness or fever. Pt states when he wakes up in the am he feels good then he takes his medications and he starts feeling bad. Pt had f/u with cardiology in [place] he canceled r/t distance from [place]. Dr. Rosezella [place] office contacted and appt made for pt on Monday, December 13 at 12pm. Pt made aware of appt and acknowledges time and location. Advised pt to call if he any further concerns arise. Pt has f/u with Dr. Prescott Gum on Wednesday, December 15. All questions answered.
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
## 2020-05-23 ENCOUNTER — Ambulatory Visit (INDEPENDENT_AMBULATORY_CARE_PROVIDER_SITE_OTHER): Payer: Medicare Other | Admitting: Cardiology

## 2020-05-23 ENCOUNTER — Other Ambulatory Visit: Payer: Self-pay

## 2020-05-23 ENCOUNTER — Ambulatory Visit (INDEPENDENT_AMBULATORY_CARE_PROVIDER_SITE_OTHER): Payer: Medicare Other

## 2020-05-23 ENCOUNTER — Encounter: Payer: Self-pay | Admitting: Cardiology

## 2020-05-23 VITALS — BP 108/58 | HR 60 | Resp 16 | Ht 68.0 in | Wt 171.4 lb

## 2020-05-23 DIAGNOSIS — I480 Paroxysmal atrial fibrillation: Secondary | ICD-10-CM

## 2020-05-23 DIAGNOSIS — E785 Hyperlipidemia, unspecified: Secondary | ICD-10-CM

## 2020-05-23 DIAGNOSIS — R0602 Shortness of breath: Secondary | ICD-10-CM | POA: Diagnosis not present

## 2020-05-23 DIAGNOSIS — I2510 Atherosclerotic heart disease of native coronary artery without angina pectoris: Secondary | ICD-10-CM | POA: Diagnosis not present

## 2020-05-23 DIAGNOSIS — Z951 Presence of aortocoronary bypass graft: Secondary | ICD-10-CM

## 2020-05-23 DIAGNOSIS — Z5181 Encounter for therapeutic drug level monitoring: Secondary | ICD-10-CM

## 2020-05-23 DIAGNOSIS — Z7901 Long term (current) use of anticoagulants: Secondary | ICD-10-CM

## 2020-05-23 DIAGNOSIS — I6523 Occlusion and stenosis of bilateral carotid arteries: Secondary | ICD-10-CM

## 2020-05-23 LAB — POCT INR: INR: 1 — AB (ref 2.0–3.0)

## 2020-05-23 NOTE — Patient Instructions (Signed)
Medication Instructions:  Stop Digoxin *If you need a refill on your cardiac medications before your next appointment, please call your pharmacy*  Lab Work: None ordered this visit  Testing/Procedures: None ordered this visit  Follow-Up: At [facility], you and your health needs are our priority.  As part of our continuing mission to provide you with exceptional heart care, we have created designated Provider Care Teams.  These Care Teams include your primary Cardiologist (physician) and Advanced Practice Providers (APPs -  Physician Assistants and Nurse Practitioners) who all work together to provide you with the care you need, when you need it.  Your next appointment:   2 month(s)  The format for your next appointment:   In Person  Provider:   Minus Breeding, MD

## 2020-05-23 NOTE — Patient Instructions (Signed)
Take 2 tablets today and then increase to  1 tablet daily except 1.5 tablets Monday, Wednesday and Friday. INR 1 week

## 2020-05-24 ENCOUNTER — Other Ambulatory Visit: Payer: Self-pay | Admitting: Cardiothoracic Surgery

## 2020-05-24 DIAGNOSIS — Z5181 Encounter for therapeutic drug level monitoring: Secondary | ICD-10-CM | POA: Diagnosis not present

## 2020-05-24 DIAGNOSIS — Z951 Presence of aortocoronary bypass graft: Secondary | ICD-10-CM | POA: Diagnosis not present

## 2020-05-24 DIAGNOSIS — Z7901 Long term (current) use of anticoagulants: Secondary | ICD-10-CM | POA: Diagnosis not present

## 2020-05-24 DIAGNOSIS — I482 Chronic atrial fibrillation, unspecified: Secondary | ICD-10-CM | POA: Diagnosis not present

## 2020-05-24 DIAGNOSIS — Z48812 Encounter for surgical aftercare following surgery on the circulatory system: Secondary | ICD-10-CM | POA: Diagnosis not present

## 2020-05-24 DIAGNOSIS — I2510 Atherosclerotic heart disease of native coronary artery without angina pectoris: Secondary | ICD-10-CM | POA: Diagnosis not present

## 2020-05-25 ENCOUNTER — Other Ambulatory Visit: Payer: Self-pay

## 2020-05-25 ENCOUNTER — Other Ambulatory Visit: Payer: Self-pay | Admitting: *Deleted

## 2020-05-25 ENCOUNTER — Ambulatory Visit (INDEPENDENT_AMBULATORY_CARE_PROVIDER_SITE_OTHER): Payer: Self-pay | Admitting: Cardiothoracic Surgery

## 2020-05-25 ENCOUNTER — Encounter: Payer: Self-pay | Admitting: Cardiothoracic Surgery

## 2020-05-25 ENCOUNTER — Ambulatory Visit
Admission: RE | Admit: 2020-05-25 | Discharge: 2020-05-25 | Disposition: A | Discharge status: Home / Self Care | Payer: Medicare Other | Source: Ambulatory Visit | Attending: Cardiothoracic Surgery | Admitting: Cardiothoracic Surgery

## 2020-05-25 VITALS — BP 130/83 | HR 65 | Resp 18 | Ht 68.0 in | Wt 172.0 lb

## 2020-05-25 DIAGNOSIS — Z951 Presence of aortocoronary bypass graft: Secondary | ICD-10-CM

## 2020-05-25 DIAGNOSIS — Z8679 Personal history of other diseases of the circulatory system: Secondary | ICD-10-CM

## 2020-05-25 DIAGNOSIS — Z9889 Other specified postprocedural states: Secondary | ICD-10-CM
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
## 2020-05-30 ENCOUNTER — Ambulatory Visit (INDEPENDENT_AMBULATORY_CARE_PROVIDER_SITE_OTHER): Payer: Medicare Other

## 2020-05-30 ENCOUNTER — Other Ambulatory Visit: Payer: Self-pay

## 2020-05-30 DIAGNOSIS — Z5181 Encounter for therapeutic drug level monitoring: Secondary | ICD-10-CM | POA: Diagnosis not present

## 2020-05-30 DIAGNOSIS — Z951 Presence of aortocoronary bypass graft: Secondary | ICD-10-CM | POA: Diagnosis not present

## 2020-05-30 DIAGNOSIS — Z7901 Long term (current) use of anticoagulants: Secondary | ICD-10-CM

## 2020-05-30 LAB — POCT INR: INR: 1 — AB (ref 2.0–3.0)

## 2020-05-30 NOTE — Patient Instructions (Signed)
Take 2.5 tablets today and then increase to  1.5 tablets daily except 1 tablet Monday and  Friday. INR 1 week

## 2020-06-01 DIAGNOSIS — Z48812 Encounter for surgical aftercare following surgery on the circulatory system: Secondary | ICD-10-CM | POA: Diagnosis not present

## 2020-06-01 DIAGNOSIS — I482 Chronic atrial fibrillation, unspecified: Secondary | ICD-10-CM | POA: Diagnosis not present

## 2020-06-01 DIAGNOSIS — Z7901 Long term (current) use of anticoagulants: Secondary | ICD-10-CM | POA: Diagnosis not present

## 2020-06-01 DIAGNOSIS — I2510 Atherosclerotic heart disease of native coronary artery without angina pectoris: Secondary | ICD-10-CM | POA: Diagnosis not present

## 2020-06-01 DIAGNOSIS — Z5181 Encounter for therapeutic drug level monitoring: Secondary | ICD-10-CM | POA: Diagnosis not present

## 2020-06-01 DIAGNOSIS — Z951 Presence of aortocoronary bypass graft: Secondary | ICD-10-CM | POA: Diagnosis not present

## 2020-06-08 ENCOUNTER — Other Ambulatory Visit: Payer: Self-pay | Admitting: Physician Assistant

## 2020-06-08 ENCOUNTER — Ambulatory Visit (INDEPENDENT_AMBULATORY_CARE_PROVIDER_SITE_OTHER): Payer: Medicare Other | Admitting: Cardiovascular Disease

## 2020-06-08 DIAGNOSIS — Z48812 Encounter for surgical aftercare following surgery on the circulatory system: Secondary | ICD-10-CM | POA: Diagnosis not present

## 2020-06-08 DIAGNOSIS — Z7901 Long term (current) use of anticoagulants: Secondary | ICD-10-CM

## 2020-06-08 DIAGNOSIS — I2510 Atherosclerotic heart disease of native coronary artery without angina pectoris: Secondary | ICD-10-CM | POA: Diagnosis not present

## 2020-06-08 DIAGNOSIS — I482 Chronic atrial fibrillation, unspecified: Secondary | ICD-10-CM | POA: Diagnosis not present

## 2020-06-08 DIAGNOSIS — I824Y9 Acute embolism and thrombosis of unspecified deep veins of unspecified proximal lower extremity: Secondary | ICD-10-CM

## 2020-06-08 DIAGNOSIS — Z951 Presence of aortocoronary bypass graft: Secondary | ICD-10-CM | POA: Diagnosis not present

## 2020-06-08 DIAGNOSIS — Z5181 Encounter for therapeutic drug level monitoring: Secondary | ICD-10-CM | POA: Diagnosis not present

## 2020-06-08 LAB — POCT INR: INR: 1 — AB (ref 2.0–3.0)

## 2020-06-11 DIAGNOSIS — Z48812 Encounter for surgical aftercare following surgery on the circulatory system: Secondary | ICD-10-CM | POA: Diagnosis not present

## 2020-06-11 DIAGNOSIS — I482 Chronic atrial fibrillation, unspecified: Secondary | ICD-10-CM | POA: Diagnosis not present

## 2020-06-11 DIAGNOSIS — Z5181 Encounter for therapeutic drug level monitoring: Secondary | ICD-10-CM | POA: Diagnosis not present

## 2020-06-11 DIAGNOSIS — Z7901 Long term (current) use of anticoagulants: Secondary | ICD-10-CM | POA: Diagnosis not present

## 2020-06-11 DIAGNOSIS — I2510 Atherosclerotic heart disease of native coronary artery without angina pectoris: Secondary | ICD-10-CM | POA: Diagnosis not present

## 2020-06-11 DIAGNOSIS — Z951 Presence of aortocoronary bypass graft: Secondary | ICD-10-CM | POA: Diagnosis not present

## 2020-06-15 ENCOUNTER — Ambulatory Visit (INDEPENDENT_AMBULATORY_CARE_PROVIDER_SITE_OTHER): Payer: Medicare Other | Admitting: *Deleted

## 2020-06-15 DIAGNOSIS — Z48812 Encounter for surgical aftercare following surgery on the circulatory system: Secondary | ICD-10-CM | POA: Diagnosis not present

## 2020-06-15 DIAGNOSIS — Z951 Presence of aortocoronary bypass graft: Secondary | ICD-10-CM

## 2020-06-15 DIAGNOSIS — Z7901 Long term (current) use of anticoagulants: Secondary | ICD-10-CM | POA: Diagnosis not present

## 2020-06-15 DIAGNOSIS — Z5181 Encounter for therapeutic drug level monitoring: Secondary | ICD-10-CM | POA: Diagnosis not present

## 2020-06-15 DIAGNOSIS — I482 Chronic atrial fibrillation, unspecified: Secondary | ICD-10-CM | POA: Diagnosis not present

## 2020-06-15 DIAGNOSIS — I824Y9 Acute embolism and thrombosis of unspecified deep veins of unspecified proximal lower extremity: Secondary | ICD-10-CM

## 2020-06-15 DIAGNOSIS — I2510 Atherosclerotic heart disease of native coronary artery without angina pectoris: Secondary | ICD-10-CM | POA: Diagnosis not present

## 2020-06-15 LAB — POCT INR: INR: 1 — AB (ref 2.0–3.0)

## 2020-06-15 NOTE — Patient Instructions (Signed)
Increase warfarin to 2 tablets daily.   Recheck INR on Monday

## 2020-06-20 ENCOUNTER — Ambulatory Visit (INDEPENDENT_AMBULATORY_CARE_PROVIDER_SITE_OTHER): Payer: Medicare Other | Admitting: *Deleted

## 2020-06-20 DIAGNOSIS — I824Y9 Acute embolism and thrombosis of unspecified deep veins of unspecified proximal lower extremity: Secondary | ICD-10-CM | POA: Diagnosis not present

## 2020-06-20 DIAGNOSIS — Z951 Presence of aortocoronary bypass graft: Secondary | ICD-10-CM | POA: Diagnosis not present

## 2020-06-20 DIAGNOSIS — Z5181 Encounter for therapeutic drug level monitoring: Secondary | ICD-10-CM | POA: Diagnosis not present

## 2020-06-20 DIAGNOSIS — Z7901 Long term (current) use of anticoagulants: Secondary | ICD-10-CM

## 2020-06-20 LAB — POCT INR: INR: 1 — AB (ref 2.0–3.0)

## 2020-06-20 NOTE — Patient Instructions (Signed)
Increase warfarin to 4 tablets daily.   Recheck INR on Thursday

## 2020-06-22 ENCOUNTER — Ambulatory Visit (INDEPENDENT_AMBULATORY_CARE_PROVIDER_SITE_OTHER): Payer: Self-pay | Admitting: Cardiothoracic Surgery

## 2020-06-22 ENCOUNTER — Encounter: Payer: Self-pay | Admitting: Cardiothoracic Surgery

## 2020-06-22 ENCOUNTER — Other Ambulatory Visit: Payer: Self-pay

## 2020-06-22 VITALS — BP 129/85 | HR 68 | Resp 20 | Ht 68.0 in | Wt 180.0 lb

## 2020-06-22 DIAGNOSIS — Z9889 Other specified postprocedural states: Secondary | ICD-10-CM

## 2020-06-22 DIAGNOSIS — Z8679 Personal history of other diseases of the circulatory system: Secondary | ICD-10-CM

## 2020-06-22 DIAGNOSIS — Z951 Presence of aortocoronary bypass graft: Secondary | ICD-10-CM

## 2020-06-22 MED ORDER — EZETIMIBE 10 MG PO TABS
10.0000 mg | ORAL_TABLET | Freq: Every day | ORAL | 3 refills | Status: DC
Start: 2020-06-22 — End: 2020-10-06
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
## 2020-06-23 ENCOUNTER — Ambulatory Visit (INDEPENDENT_AMBULATORY_CARE_PROVIDER_SITE_OTHER): Payer: Medicare Other | Admitting: *Deleted

## 2020-06-23 DIAGNOSIS — Z951 Presence of aortocoronary bypass graft: Secondary | ICD-10-CM

## 2020-06-23 DIAGNOSIS — I824Y9 Acute embolism and thrombosis of unspecified deep veins of unspecified proximal lower extremity: Secondary | ICD-10-CM

## 2020-06-23 DIAGNOSIS — Z7901 Long term (current) use of anticoagulants: Secondary | ICD-10-CM | POA: Diagnosis not present

## 2020-06-23 DIAGNOSIS — Z5181 Encounter for therapeutic drug level monitoring: Secondary | ICD-10-CM

## 2020-06-23 LAB — POCT INR: INR: 1 — AB (ref 2.0–3.0)

## 2020-06-23 MED ORDER — WARFARIN SODIUM 5 MG PO TABS
5.0000 mg | ORAL_TABLET | Freq: Every day | ORAL | 3 refills | Status: DC
Start: 2020-06-23 — End: 2020-08-01

## 2020-06-23 NOTE — Patient Instructions (Signed)
Warfarin changed to 5mg  tablet Increase warfarin to 5mg  (1 tablet) daily.   Recheck INR on Tuesday

## 2020-06-27 ENCOUNTER — Telehealth: Payer: Self-pay | Admitting: *Deleted

## 2020-06-27 NOTE — Telephone Encounter (Signed)
Returned call to pt, spoke with pt, his dosage was changed at last OV to 5mg  tablet once daily.  Advised pt to continue on this same dosage 5mg  QD, until we recheck his INR on 07/01/20.  Pt verbalized understanding.

## 2020-06-27 NOTE — Telephone Encounter (Signed)
Patient had appointment on 06-28-2020. Re-scheduled until 1-21. Patient needs to speak with someone in regards to his dosage of coumadin.

## 2020-07-01 ENCOUNTER — Ambulatory Visit (INDEPENDENT_AMBULATORY_CARE_PROVIDER_SITE_OTHER): Payer: Medicare Other | Admitting: *Deleted

## 2020-07-01 ENCOUNTER — Other Ambulatory Visit: Payer: Self-pay

## 2020-07-01 DIAGNOSIS — I824Y9 Acute embolism and thrombosis of unspecified deep veins of unspecified proximal lower extremity: Secondary | ICD-10-CM | POA: Diagnosis not present

## 2020-07-01 DIAGNOSIS — Z7901 Long term (current) use of anticoagulants: Secondary | ICD-10-CM | POA: Diagnosis not present

## 2020-07-01 DIAGNOSIS — Z951 Presence of aortocoronary bypass graft: Secondary | ICD-10-CM | POA: Diagnosis not present

## 2020-07-01 LAB — POCT INR: INR: 1.3 — AB (ref 2.0–3.0)

## 2020-07-01 NOTE — Patient Instructions (Signed)
Warfarin changed to 5mg  tablet Increase warfarin to 7.5mg  (1 1/2 tablets) daily.   Recheck INR on Tuesday  07/05/20

## 2020-07-05 ENCOUNTER — Other Ambulatory Visit: Payer: Self-pay

## 2020-07-05 ENCOUNTER — Ambulatory Visit (INDEPENDENT_AMBULATORY_CARE_PROVIDER_SITE_OTHER): Payer: Medicare Other | Admitting: *Deleted

## 2020-07-05 DIAGNOSIS — Z79899 Other long term (current) drug therapy: Secondary | ICD-10-CM | POA: Diagnosis not present

## 2020-07-05 DIAGNOSIS — Z951 Presence of aortocoronary bypass graft: Secondary | ICD-10-CM

## 2020-07-05 DIAGNOSIS — Z7901 Long term (current) use of anticoagulants: Secondary | ICD-10-CM | POA: Diagnosis not present

## 2020-07-05 DIAGNOSIS — I824Y9 Acute embolism and thrombosis of unspecified deep veins of unspecified proximal lower extremity: Secondary | ICD-10-CM | POA: Diagnosis not present

## 2020-07-05 LAB — POCT INR: INR: 1.8 — AB (ref 2.0–3.0)

## 2020-07-05 NOTE — Patient Instructions (Addendum)
Warfarin changed to 5mg  tablet Start warfarin 1 1/2 tablets daily except 1 tablet on Sundays and Thursdays  Recheck INR on 07/11/20

## 2020-07-06 ENCOUNTER — Encounter (HOSPITAL_COMMUNITY)
Admission: RE | Admit: 2020-07-06 | Discharge: 2020-07-06 | Disposition: A | Discharge status: Home / Self Care | Payer: Medicare Other | Source: Ambulatory Visit | Attending: Cardiology | Admitting: Cardiology

## 2020-07-06 VITALS — BP 118/74 | HR 68 | Ht 68.0 in | Wt 180.1 lb

## 2020-07-06 DIAGNOSIS — Z951 Presence of aortocoronary bypass graft: Secondary | ICD-10-CM | POA: Insufficient documentation

## 2020-07-06 NOTE — Progress Notes (Signed)
Cardiac Individual Treatment Plan  Patient Details  Name: Guy Morris MRN: [id number] Date of Birth: 08-06-1950 Referring Provider:   Flowsheet Row CARDIAC REHAB PHASE II ORIENTATION from 07/06/2020 in [facility]  Referring Provider [name]      Initial Encounter Date:  Flowsheet Row CARDIAC REHAB PHASE II ORIENTATION from 07/06/2020 in [facility]  Date 07/06/20      Visit Diagnosis: S/P CABG x 4  Patient's Home Medications on Admission:  Current Outpatient Medications:  .  omeprazole (PRILOSEC) 20 MG capsule, Take 20 mg by mouth daily as needed (uses OTC for heartburn)., Disp: , Rfl:  .  ascorbic acid (VITAMIN C) 500 MG tablet, Take 500 mg by mouth daily., Disp: , Rfl:  .  aspirin EC 81 MG tablet, Take 81 mg by mouth daily. Swallow whole., Disp: , Rfl:  .  Ca Carbonate-Mag Hydroxide (ROLAIDS PO), Take 1 tablet by mouth 3 (three) times daily as needed (heartburn)., Disp: , Rfl:  .  diltiazem (CARDIZEM SR) 120 MG 12 hr capsule, Take 1 capsule (120 mg total) by mouth every 12 (twelve) hours., Disp: 60 capsule, Rfl: 3 .  ezetimibe (ZETIA) 10 MG tablet, Take 1 tablet (10 mg total) by mouth daily., Disp: 30 tablet, Rfl: 3 .  levothyroxine (EUTHYROX) 150 MCG tablet, Take 1 tablet (150 mcg total) by mouth daily., Disp: 90 tablet, Rfl: 1 .  meclizine (ANTIVERT) 12.5 MG tablet, Take 1 tablet (12.5 mg total) by mouth 3 (three) times daily as needed for dizziness., Disp: 30 tablet, Rfl: 2 .  metoprolol tartrate (LOPRESSOR) 25 MG tablet, Take 0.5 tablets (12.5 mg total) by mouth 2 (two) times daily., Disp: 30 tablet, Rfl: 3 .  nitroGLYCERIN (NITROSTAT) 0.4 MG SL tablet, Place 1 tablet (0.4 mg total) under the tongue every 5 (five) minutes as needed for chest pain., Disp: 90 tablet, Rfl: 3 .  polyethylene glycol (MIRALAX / GLYCOLAX) 17 g packet, Take 17 g by mouth daily., Disp: 14 each, Rfl: 0 .  Turmeric Curcumin 500 MG CAPS, Take 1,000 mg by  mouth daily., Disp: , Rfl:  .  warfarin (COUMADIN) 5 MG tablet, Take 1 tablet (5 mg total) by mouth daily. (Patient taking differently: Take 5 mg by mouth daily. Take 60m on Sunday and Thursday, then 7.550mthe rest of the week), Disp: 30 tablet, Rfl: 3  Past Medical History: Past Medical History:  Diagnosis Date  . ABNORMAL TRANSAMINASE-LFT'S 08/04/2008   Qualifier: Diagnosis of  By: StFuller PlanD FACG, MaBlue LakeHINITIS 05/06/2007   Qualifier: Diagnosis of  By: JoJenny ReichmannD, JaHunt Oris . Allergy   . BPH (benign prostatic hyperplasia) 04/28/2012  . COPD (chronic obstructive pulmonary disease) ([facility]  . Degenerative joint disease 04/23/2011  . ERECTILE DYSFUNCTION 05/06/2007   Qualifier: Diagnosis of  By: JoJenny ReichmannD, JaHunt Oris . GERD 05/06/2007   Qualifier: Diagnosis of  By: JoJenny ReichmannD, JaHunt Oris . HYPERLIPIDEMIA 05/06/2007   Qualifier: Diagnosis of  By: JoJenny ReichmannD, JaHunt Oris . HYPOTHYROIDISM 05/06/2007   Qualifier: Diagnosis of  By: JoJenny ReichmannD, JaHunt Oris . Insomnia 04/28/2012  . OSTEOARTHRITIS, KNEE, 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   Qualifier: Diagnosis of  By: JoJenny ReichmannD, JaHunt Oris . Personal history of colonic polyps 07/20/2008   Centricity Description: PERSONAL HX COLONIC POLYPS Qualifier: Diagnosis of  By: JaArdis HughsD, DaMelene PlanCentricity Description: COLONIC POLYPS, HX OF Qualifier: Diagnosis  of  By: Jenny Reichmann MD, Hunt Oris   . RLS (restless legs syndrome) 10/17/2010  . Urge incontinence 04/28/2012   [facility] heps per urology    Tobacco Use: Social History   Tobacco Use  Smoking Status Former Smoker  . Packs/day: 2.00  . Years: 37.00  . Pack years: 74.00  . Quit date: 06/11/2000  . Years since quitting: 20.0  Smokeless Tobacco Never Used  Tobacco Comment   quit smoking cigarettes 2002 and quit cigars 2016    Labs: Recent Review Flowsheet Data    Labs for ITP Cardiac and Pulmonary Rehab Latest Ref Rng & Units 04/30/2020 04/30/2020 05/01/2020 05/01/2020 05/01/2020   Cholestrol 0 - 200 mg/dL - - - - -    LDLCALC 0 - 99 mg/dL - - - - -   LDLDIRECT mg/dL - - - - -   HDL >40 mg/dL - - - - -   Trlycerides <150 mg/dL - - - - -   Hemoglobin A1c 4.8 - 5.6 % - - - - -   PHART 7.350 - 7.450 - - - - -   PCO2ART 32.0 - 48.0 mmHg - - - - -   HCO3 20.0 - 28.0 mmol/L - - - - -   TCO2 22 - 32 mmol/L - - - - -   ACIDBASEDEF 0.0 - 2.0 mmol/L - - - - -   O2SAT % 48.8 60.6 49.7 40.1 46.7      Capillary Blood Glucose: Lab Results  Component Value Date   GLUCAP 84 05/08/2020   GLUCAP 122 (H) 05/02/2020   GLUCAP 117 (H) 05/02/2020   GLUCAP 132 (H) 05/02/2020   GLUCAP 119 (H) 05/02/2020     Exercise Target Goals: Exercise Program Goal: Individual exercise prescription set using results from initial 6 min walk test and THRR while considering  patient's activity barriers and safety.   Exercise Prescription Goal: Starting with aerobic activity 30 plus minutes a day, 3 days per week for initial exercise prescription. Provide home exercise prescription and guidelines that participant acknowledges understanding prior to discharge.  Activity Barriers & Risk Stratification:  Activity Barriers & Cardiac Risk Stratification - 07/06/20 1258      Activity Barriers & Cardiac Risk Stratification   Activity Barriers Arthritis;Right Knee Replacement;Joint Problems;Shortness of Breath;Balance Concerns;History of Falls    Cardiac Risk Stratification High           6 Minute Walk:  6 Minute Walk    Row Name 07/06/20 1421         6 Minute Walk   Phase Initial     Distance 1300 feet     Walk Time 6 minutes     # of Rest Breaks 0     MPH 2.5     METS 2.99     RPE 12     VO2 Peak 10.49     Symptoms Yes (comment)     Comments SOB     Resting HR 68 bpm     Resting BP 118/74     Resting Oxygen Saturation  97 %     Exercise Oxygen Saturation  during 6 min walk 97 %     Max Ex. HR 83 bpm     Max Ex. BP 152/76     2 Minute Post BP 128/76            Oxygen Initial Assessment:   Oxygen  Re-Evaluation:   Oxygen Discharge (Final Oxygen Re-Evaluation):   Initial Exercise  Prescription:  Initial Exercise Prescription - 07/06/20 1400      Date of Initial Exercise RX and Referring Provider   Date 07/06/20    Referring Provider [name]    Expected Discharge Date 09/30/20      Recumbant Elliptical   Level 1    RPM 60    Minutes 39      Intensity   THRR 40-80% of Max Heartrate 61-122    Ratings of Perceived Exertion 11-13    Perceived Dyspnea 0-4      Resistance Training   Training Prescription Yes    Weight 3    Reps 10-15           Perform Capillary Blood Glucose checks as needed.  Exercise Prescription Changes:   Exercise Comments:   Exercise Goals and Review:  Exercise Goals    Row Name 07/06/20 1424             Exercise Goals   Increase Physical Activity Yes       Intervention Provide advice, education, support and counseling about physical activity/exercise needs.;Develop an individualized exercise prescription for aerobic and resistive training based on initial evaluation findings, risk stratification, comorbidities and participant's personal goals.       Expected Outcomes Short Term: Attend rehab on a regular basis to increase amount of physical activity.;Long Term: Add in home exercise to make exercise part of routine and to increase amount of physical activity.;Long Term: Exercising regularly at least 3-5 days a week.       Increase Strength and Stamina Yes       Intervention Provide advice, education, support and counseling about physical activity/exercise needs.;Develop an individualized exercise prescription for aerobic and resistive training based on initial evaluation findings, risk stratification, comorbidities and participant's personal goals.       Expected Outcomes Short Term: Increase workloads from initial exercise prescription for resistance, speed, and METs.;Short Term: Perform resistance training exercises routinely during  rehab and add in resistance training at home;Long Term: Improve cardiorespiratory fitness, muscular endurance and strength as measured by increased METs and functional capacity (6MWT)       Able to understand and use rate of perceived exertion (RPE) scale Yes       Intervention Provide education and explanation on how to use RPE scale       Expected Outcomes Short Term: Able to use RPE daily in rehab to express subjective intensity level;Long Term:  Able to use RPE to guide intensity level when exercising independently       Knowledge and understanding of Target Heart Rate Range (THRR) Yes       Intervention Provide education and explanation of THRR including how the numbers were predicted and where they are located for reference       Expected Outcomes Short Term: Able to state/look up THRR;Long Term: Able to use THRR to govern intensity when exercising independently;Short Term: Able to use daily as guideline for intensity in rehab       Able to check pulse independently Yes       Intervention Provide education and demonstration on how to check pulse in carotid and radial arteries.;Review the importance of being able to check your own pulse for safety during independent exercise       Expected Outcomes Short Term: Able to explain why pulse checking is important during independent exercise;Long Term: Able to check pulse independently and accurately       Understanding of Exercise Prescription Yes  Intervention Provide education, explanation, and written materials on patient's individual exercise prescription       Expected Outcomes Short Term: Able to explain program exercise prescription;Long Term: Able to explain home exercise prescription to exercise independently              Exercise Goals Re-Evaluation :    Discharge Exercise Prescription (Final Exercise Prescription Changes):   Nutrition:  Target Goals: Understanding of nutrition guidelines, daily intake of sodium <1554m,  cholesterol <2024m calories 30% from fat and 7% or less from saturated fats, daily to have 5 or more servings of fruits and vegetables.  Biometrics:  Pre Biometrics - 07/06/20 1425      Pre Biometrics   Height '5\' 8"'  (1.727 m)    Weight 180 lb 1.9 oz (81.7 kg)    Waist Circumference 29.5 inches    Hip Circumference 40 inches    Waist to Hip Ratio 0.74 %    BMI (Calculated) 27.39    Triceps Skinfold 6 mm    % Body Fat 23.6 %    Grip Strength 36.1 kg    Flexibility 28.5 in    Single Leg Stand 4 seconds            Nutrition Therapy Plan and Nutrition Goals:   Nutrition Assessments:  Nutrition Assessments - 07/06/20 1313      MEDFICTS Scores   Pre Score 61          MEDIFICTS Score Key:  ?70 Need to make dietary changes   40-70 Heart Healthy Diet  ? 40 Therapeutic Level Cholesterol Diet   Picture Your Plate Scores:  <4<74nhealthy dietary pattern with much room for improvement.  41-50 Dietary pattern unlikely to meet recommendations for good health and room for improvement.  51-60 More healthful dietary pattern, with some room for improvement.   >60 Healthy dietary pattern, although there may be some specific behaviors that could be improved.    Nutrition Goals Re-Evaluation:   Nutrition Goals Discharge (Final Nutrition Goals Re-Evaluation):   Psychosocial: Target Goals: Acknowledge presence or absence of significant depression and/or stress, maximize coping skills, provide positive support system. Participant is able to verbalize types and ability to use techniques and skills needed for reducing stress and depression.  Initial Review & Psychosocial Screening:  Initial Psych Review & Screening - 07/06/20 1259      Initial Review   Current issues with Current Stress Concerns    Source of Stress Concerns Chronic Illness    Comments His INR levels and the fact that he is taking coumadin stresses him out. He does not want to take it, but has been compliant  since his doctor said that he needs to take it.      Family Dynamics   Good Support System? Yes    Comments His wife and children support him. He has grand children support him as well. He often talks to his pastor about his problems as well. He has been reading the Bible a lot recently and feels like his stronger connection with the [employer]liche[employer] helped him cope better recently.      Barriers   Psychosocial barriers to participate in program The patient should benefit from training in stress management and relaxation.      Screening Interventions   Interventions Encouraged to exercise    Expected Outcomes Long Term goal: The participant improves quality of Life and PHQ9 Scores as seen by post scores and/or verbalization of changes;Short Term goal: Identification and  review with participant of any Quality of Life or Depression concerns found by scoring the questionnaire.           Quality of Life Scores:  Quality of Life - 07/06/20 1425      Quality of Life   Select Quality of Life      Quality of Life Scores   Health/Function Pre 16 %    Socioeconomic Pre 19 %    Psych/Spiritual Pre 23.07 %    Family Pre 15 %    GLOBAL Pre 17.89 %          Scores of 19 and below usually indicate a poorer quality of life in these areas.  A difference of  2-3 points is a clinically meaningful difference.  A difference of 2-3 points in the total score of the Quality of Life Index has been associated with significant improvement in overall quality of life, self-image, physical symptoms, and general health in studies assessing change in quality of life.  PHQ-9: Recent Review Flowsheet Data    Depression screen [facility] 2/9 07/06/2020 02/22/2020 02/18/2020 02/12/2019 08/14/2018   Decreased Interest 1  0 0 0 0   Down, Depressed, Hopeless 0 0 0 0 0   PHQ - 2 Score 1 0 0 0 0   Altered sleeping 2 - - - -   Tired, decreased energy 2 - - - -   Change in appetite 0 - - - -   Feeling bad or failure about yourself  0 - -  - -   Trouble concentrating 0 - - - -   Moving slowly or fidgety/restless 0 - - - -   Suicidal thoughts 0 - - - -   PHQ-9 Score 5 - - - -   Difficult doing work/chores Somewhat difficult - - - -     Interpretation of Total Score  Total Score Depression Severity:  1-4 = Minimal depression, 5-9 = Mild depression, 10-14 = Moderate depression, 15-19 = Moderately severe depression, 20-27 = Severe depression   Psychosocial Evaluation and Intervention:  Psychosocial Evaluation - 07/06/20 1435      Psychosocial Evaluation & Interventions   Interventions Stress management education;Encouraged to exercise with the program and follow exercise prescription    Comments Patient is slightly stressed due to his toleration of Coumadin and his increased breathing. He is hoping that his doctor removes this from his medication list, as his INR keeps improving. He has no other identifiable psychosocial issues.    Expected Outcomes Patient will continue to not have any psychosocial issues.    Continue Psychosocial Services  No Follow up required           Psychosocial Re-Evaluation:   Psychosocial Discharge (Final Psychosocial Re-Evaluation):   Vocational Rehabilitation: Provide vocational rehab assistance to qualifying candidates.   Vocational Rehab Evaluation & Intervention:  Vocational Rehab - 07/06/20 1310      Initial Vocational Rehab Evaluation & Intervention   Assessment shows need for Vocational Rehabilitation No      Vocational Rehab Re-Evaulation   Comments He is retired and has no interest in returning to work.           Education: Education Goals: Education classes will be provided on a weekly basis, covering required topics. Participant will state understanding/return demonstration of topics presented.  Learning Barriers/Preferences:  Learning Barriers/Preferences - 07/06/20 1303      Learning Barriers/Preferences   Learning Barriers Hearing    Learning Preferences  Skilled Demonstration  Education Topics: Hypertension, Hypertension Reduction -Define heart disease and high blood pressure. Discus how high blood pressure affects the body and ways to reduce high blood pressure.   Exercise and Your Heart -Discuss why it is important to exercise, the FITT principles of exercise, normal and abnormal responses to exercise, and how to exercise safely.   Angina -Discuss definition of angina, causes of angina, treatment of angina, and how to decrease risk of having angina.   Cardiac Medications -Review what the following cardiac medications are used for, how they affect the body, and side effects that may occur when taking the medications.  Medications include Aspirin, Beta blockers, calcium channel blockers, ACE Inhibitors, angiotensin receptor blockers, diuretics, digoxin, and antihyperlipidemics.   Congestive Heart Failure -Discuss the definition of CHF, how to live with CHF, the signs and symptoms of CHF, and how keep track of weight and sodium intake.   Heart Disease and Intimacy -Discus the effect sexual activity has on the heart, how changes occur during intimacy as we age, and safety during sexual activity.   Smoking Cessation / COPD -Discuss different methods to quit smoking, the health benefits of quitting smoking, and the definition of COPD.   Nutrition I: Fats -Discuss the types of cholesterol, what cholesterol does to the heart, and how cholesterol levels can be controlled.   Nutrition II: Labels -Discuss the different components of food labels and how to read food label   Heart Parts/Heart Disease and PAD -Discuss the anatomy of the heart, the pathway of blood circulation through the heart, and these are affected by heart disease.   Stress I: Signs and Symptoms -Discuss the causes of stress, how stress may lead to anxiety and depression, and ways to limit stress.   Stress II: Relaxation -Discuss different types of  relaxation techniques to limit stress.   Warning Signs of Stroke / TIA -Discuss definition of a stroke, what the signs and symptoms are of a stroke, and how to identify when someone is having stroke.   Knowledge Questionnaire Score:  Knowledge Questionnaire Score - 07/06/20 1310      Knowledge Questionnaire Score   Pre Score 21/24           Core Components/Risk Factors/Patient Goals at Admission:  Personal Goals and Risk Factors at Admission - 07/06/20 1304      Core Components/Risk Factors/Patient Goals on Admission    Weight Management Yes;Weight Maintenance    Intervention Weight Management: Develop a combined nutrition and exercise program designed to reach desired caloric intake, while maintaining appropriate intake of nutrient and fiber, sodium and fats, and appropriate energy expenditure required for the weight goal.;Weight Management: Provide education and appropriate resources to help participant work on and attain dietary goals.;Weight Management/Obesity: Establish reasonable short term and long term weight goals.;Obesity: Provide education and appropriate resources to help participant work on and attain dietary goals.    Expected Outcomes Short Term: Continue to assess and modify interventions until short term weight is achieved;Long Term: Adherence to nutrition and physical activity/exercise program aimed toward attainment of established weight goal;Weight Maintenance: Understanding of the daily nutrition guidelines, which includes 25-35% calories from fat, 7% or less cal from saturated fats, less than 272m cholesterol, less than 1.5gm of sodium, & 5 or more servings of fruits and vegetables daily;Weight Loss: Understanding of general recommendations for a balanced deficit meal plan, which promotes 1-2 lb weight loss per week and includes a negative energy balance of [phone] kcal/d;Understanding recommendations for meals to include 15-35% energy  as protein, 25-35% energy from  fat, 35-60% energy from carbohydrates, less than 234m of dietary cholesterol, 20-35 gm of total fiber daily;Understanding of distribution of calorie intake throughout the day with the consumption of 4-5 meals/snacks;Weight Gain: Understanding of general recommendations for a high calorie, high protein meal plan that promotes weight gain by distributing calorie intake throughout the day with the consumption for 4-5 meals, snacks, and/or supplements    Improve shortness of breath with ADL's Yes    Intervention Provide education, individualized exercise plan and daily activity instruction to help decrease symptoms of SOB with activities of daily living.    Expected Outcomes Short Term: Improve cardiorespiratory fitness to achieve a reduction of symptoms when performing ADLs;Long Term: Be able to perform more ADLs without symptoms or delay the onset of symptoms    Lipids Yes    Intervention Provide education and support for participant on nutrition & aerobic/resistive exercise along with prescribed medications to achieve LDL <761m HDL >4089m   Expected Outcomes Short Term: Participant states understanding of desired cholesterol values and is compliant with medications prescribed. Participant is following exercise prescription and nutrition guidelines.;Long Term: Cholesterol controlled with medications as prescribed, with individualized exercise RX and with personalized nutrition plan. Value goals: LDL < 19m61mDL > 40 mg.    Stress Yes    Intervention Offer individual and/or small group education and counseling on adjustment to heart disease, stress management and health-related lifestyle change. Teach and support self-help strategies.;Refer participants experiencing significant psychosocial distress to appropriate mental health specialists for further evaluation and treatment. When possible, include family members and significant others in education/counseling sessions.    Expected Outcomes Short Term:  Participant demonstrates changes in health-related behavior, relaxation and other stress management skills, ability to obtain effective social support, and compliance with psychotropic medications if prescribed.;Long Term: Emotional wellbeing is indicated by absence of clinically significant psychosocial distress or social isolation.    Personal Goal Other Yes    Personal Goal He would like to play golf and fish again.    Intervention Attend cardiac rehab and continue with his home exercise program.    Expected Outcomes He will reach a MET level through exercise that allows him to golf again.           Core Components/Risk Factors/Patient Goals Review:    Core Components/Risk Factors/Patient Goals at Discharge (Final Review):    ITP Comments:   Comments: Patient arrived for 1st visit/orientation/education at 1230. Patient was referred to CR by Dr. HochPercival Spanish to S/P CABG x 4 (Z95.1). During orientation advised patient on arrival and appointment times what to wear, what to do before, during and after exercise. Reviewed attendance and class policy.  Pt is scheduled to return Cardiac Rehab on 07/11/2020 at 0815. Pt was advised to come to class 15 minutes before class starts.  Discussed RPE/Dpysnea scales. Patient participated in warm up stretches. Patient was able to complete 6 minute walk test.  Telemetry: NSR. Patient was measured for the equipment. Discussed equipment safety with patient. Took patient pre-anthropometric measurements. Patient finished visit at 1415.

## 2020-07-06 NOTE — Progress Notes (Addendum)
Cardiac/Pulmonary Rehab Medication Review by a Pharmacist  Does the patient  feel that his/her medications are working for him/her?  yes  Has the patient been experiencing any side effects to the medications prescribed?  yes  Does the patient measure his/her own blood pressure or blood glucose at home?  yes   Does the patient have any problems obtaining medications due to transportation or finances?   no  Understanding of regimen: good Understanding of indications: good Potential of compliance: excellent  Questions asked to Determine Patient Understanding of Medication Regimen:  1. What is the name of the medication?  2. What is the medication used for?  3. When should it be taken?  4. How much should be taken?  5. How will you take it?  6. What side effects should you report?  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 We reviewed his medications and he is tolerating all except having difficulty with Coumadin. He has had some bleeding on ear and leg which looks like he may have scratched it. His INR has been around 1.8 -2. He has some reflux and takes OTC prilosec. Instructed him that he may consider taking daily if persists. Otherwise, he is doing well. He monitors his BP daily. Continue current regimen.  Thanks for the opportunity to participate in the care of this patient,  Isac Sarna, BS [place], [place] Clinical Pharmacist Pager [phone] 07/06/2020 1:33 PM

## 2020-07-11 ENCOUNTER — Ambulatory Visit (INDEPENDENT_AMBULATORY_CARE_PROVIDER_SITE_OTHER): Payer: Medicare Other | Admitting: *Deleted

## 2020-07-11 ENCOUNTER — Encounter (HOSPITAL_COMMUNITY)
Admission: RE | Admit: 2020-07-11 | Discharge: 2020-07-11 | Disposition: A | Discharge status: Home / Self Care | Payer: Medicare Other | Source: Ambulatory Visit | Attending: Cardiology | Admitting: Cardiology

## 2020-07-11 ENCOUNTER — Other Ambulatory Visit: Payer: Self-pay

## 2020-07-11 DIAGNOSIS — Z951 Presence of aortocoronary bypass graft: Secondary | ICD-10-CM

## 2020-07-11 DIAGNOSIS — Z7901 Long term (current) use of anticoagulants: Secondary | ICD-10-CM | POA: Diagnosis not present

## 2020-07-11 LAB — POCT INR: INR: 2.5 (ref 2.0–3.0)

## 2020-07-11 NOTE — Patient Instructions (Signed)
Warfarin changed to 5mg  tablet Continue 1 1/2 tablets daily except 1 tablet on Sundays and Thursdays  Recheck INR in 2 wks

## 2020-07-11 NOTE — Progress Notes (Signed)
Daily Session Note  Patient Details  Name: Guy Morris MRN: [id number]  Date of Birth: 1950-08-12 Referring Provider:   Flowsheet Row CARDIAC REHAB PHASE II ORIENTATION from 07/06/2020 in [place]  Referring Provider [name]      Encounter Date: 07/11/2020  Check In:  Session Check In - 07/11/20 0815      Check-In   Physician(s) Domenic Polite    Location [facility]    Staff Present Geanie Cooley, RN;Madison Audria Nine, MS, Exercise Physiologist;Dalton Kris Mouton, MS, ACSM-CEP, Exercise Physiologist;Debra Wynetta Emery, RN, BSN    Virtual Visit No    Medication changes reported     No    Fall or balance concerns reported    No    Tobacco Cessation No Change    Warm-up and Cool-down Performed as group-led instruction    Resistance Training Performed Yes    VAD Patient? No    PAD/SET Patient? No      Pain Assessment   Currently in Pain? No/denies    Multiple Pain Sites No           Capillary Blood Glucose: No results found for this or any previous visit (from the past 24 hour(s)).    Social History   Tobacco Use  Smoking Status Former Smoker  . Packs/day: 2.00  . Years: 37.00  . Pack years: 74.00  . Quit date: 06/11/2000  . Years since quitting: 20.0  Smokeless Tobacco Never Used  Tobacco Comment   quit smoking cigarettes 2002 and quit cigars 2016    Goals Met:  Independence with exercise equipment Exercise tolerated well No report of cardiac concerns or symptoms Strength training completed today  Goals Unmet:  Not Applicable  Comments: check out @ 9:30   Dr. Kathie Dike is Medical Director for [facility].

## 2020-07-13 ENCOUNTER — Other Ambulatory Visit: Payer: Self-pay

## 2020-07-13 ENCOUNTER — Encounter (HOSPITAL_COMMUNITY)
Admission: RE | Admit: 2020-07-13 | Discharge: 2020-07-13 | Disposition: A | Discharge status: Home / Self Care | Payer: Medicare Other | Source: Ambulatory Visit | Attending: Cardiology | Admitting: Cardiology

## 2020-07-13 DIAGNOSIS — Z951 Presence of aortocoronary bypass graft: Secondary | ICD-10-CM | POA: Diagnosis not present

## 2020-07-13 NOTE — Progress Notes (Signed)
Daily Session Note  Patient Details  Name: Guy Morris MRN: [id number] Date of Birth: 06/05/1951 Referring Provider:   Flowsheet Row CARDIAC REHAB PHASE II ORIENTATION from 07/06/2020 in [place]  Referring Provider [name]      Encounter Date: 07/13/2020  Check In:  Session Check In - 07/13/20 0815      Check-In   Supervising physician immediately available to respond to emergencies [facility] MD immediately available    Physician(s) Domenic Polite    Location [facility]    Staff Present Cathren Harsh, MS, Exercise Physiologist;Dalton Kris Mouton, MS, ACSM-CEP, Exercise Physiologist    Virtual Visit No    Medication changes reported     No    Fall or balance concerns reported    No    Tobacco Cessation No Change    Warm-up and Cool-down Performed as group-led instruction    Resistance Training Performed Yes    VAD Patient? No    PAD/SET Patient? No      Pain Assessment   Currently in Pain? No/denies    Multiple Pain Sites No           Capillary Blood Glucose: No results found for this or any previous visit (from the past 24 hour(s)).    Social History   Tobacco Use  Smoking Status Former Smoker  . Packs/day: 2.00  . Years: 37.00  . Pack years: 74.00  . Quit date: 06/11/2000  . Years since quitting: 20.1  Smokeless Tobacco Never Used  Tobacco Comment   quit smoking cigarettes 2002 and quit cigars 2016    Goals Met:  Independence with exercise equipment Exercise tolerated well No report of cardiac concerns or symptoms Strength training completed today  Goals Unmet:  Not Applicable  Comments: check out 0915   Dr. Kathie Dike is Medical Director for [facility].

## 2020-07-15 ENCOUNTER — Other Ambulatory Visit: Payer: Self-pay

## 2020-07-15 ENCOUNTER — Encounter (HOSPITAL_COMMUNITY)
Admission: RE | Admit: 2020-07-15 | Discharge: 2020-07-15 | Disposition: A | Discharge status: Home / Self Care | Payer: Medicare Other | Source: Ambulatory Visit | Attending: Cardiology | Admitting: Cardiology

## 2020-07-15 DIAGNOSIS — Z951 Presence of aortocoronary bypass graft: Secondary | ICD-10-CM

## 2020-07-15 NOTE — Progress Notes (Signed)
Daily Session Note  Patient Details  Name: Guy Morris MRN: [id number] Date of Birth: 11-20-1950 Referring Provider:   Flowsheet Row CARDIAC REHAB PHASE II ORIENTATION from 07/06/2020 in [place]  Referring Provider [name]      Encounter Date: 07/15/2020  Check In:  Session Check In - 07/15/20 0815      Check-In   Supervising physician immediately available to respond to emergencies [facility] MD immediately available    Physician(s) Dr. Harl Bowie    Location [facility]    Staff Present Cathren Harsh, MS, Exercise Physiologist;Jaria Conway Kris Mouton, MS, ACSM-CEP, Exercise Physiologist    Virtual Visit No    Medication changes reported     No    Fall or balance concerns reported    Yes    Comments Patient fell coming out of his sister's crawl space on Wednesday evening. He bruised his arm and has an abrasion on his elbow, but had no significant injuries.    Tobacco Cessation No Change    Warm-up and Cool-down Performed as group-led instruction    Resistance Training Performed Yes    VAD Patient? No    PAD/SET Patient? No      Pain Assessment   Currently in Pain? No/denies    Multiple Pain Sites No           Capillary Blood Glucose: No results found for this or any previous visit (from the past 24 hour(s)).    Social History   Tobacco Use  Smoking Status Former Smoker  . Packs/day: 2.00  . Years: 37.00  . Pack years: 74.00  . Quit date: 06/11/2000  . Years since quitting: 20.1  Smokeless Tobacco Never Used  Tobacco Comment   quit smoking cigarettes 2002 and quit cigars 2016    Goals Met:  Independence with exercise equipment Exercise tolerated well No report of cardiac concerns or symptoms Strength training completed today  Goals Unmet:  Not Applicable  Comments: checkout time is 0915   Dr. Kathie Dike is Medical Director for [facility].

## 2020-07-18 ENCOUNTER — Other Ambulatory Visit: Payer: Self-pay

## 2020-07-18 ENCOUNTER — Encounter (HOSPITAL_COMMUNITY)
Admission: RE | Admit: 2020-07-18 | Discharge: 2020-07-18 | Disposition: A | Discharge status: Home / Self Care | Payer: Medicare Other | Source: Ambulatory Visit | Attending: Cardiology | Admitting: Cardiology

## 2020-07-18 VITALS — Wt 180.3 lb

## 2020-07-18 DIAGNOSIS — Z951 Presence of aortocoronary bypass graft: Secondary | ICD-10-CM

## 2020-07-18 NOTE — Progress Notes (Signed)
Daily Session Note  Patient Details  Name: Guy Morris MRN: [id number] Date of Birth: June 13, 1950 Referring Provider:   Flowsheet Row CARDIAC REHAB PHASE II ORIENTATION from 07/06/2020 in [place]  Referring Provider [name]      Encounter Date: 07/18/2020  Check In:  Session Check In - 07/18/20 0815      Check-In   Supervising physician immediately available to respond to emergencies [facility] MD immediately available    Physician(s) Dr. Harl Bowie    Location [facility]    Staff Present Cathren Harsh, MS, Exercise Physiologist;Dalton Kris Mouton, MS, ACSM-CEP, Exercise Physiologist    Virtual Visit No    Medication changes reported     No    Fall or balance concerns reported    No    Tobacco Cessation No Change    Warm-up and Cool-down Performed as group-led instruction    Resistance Training Performed Yes    VAD Patient? No    PAD/SET Patient? No      Pain Assessment   Currently in Pain? No/denies    Multiple Pain Sites No           Capillary Blood Glucose: No results found for this or any previous visit (from the past 24 hour(s)).    Social History   Tobacco Use  Smoking Status Former Smoker  . Packs/day: 2.00  . Years: 37.00  . Pack years: 74.00  . Quit date: 06/11/2000  . Years since quitting: 20.1  Smokeless Tobacco Never Used  Tobacco Comment   quit smoking cigarettes 2002 and quit cigars 2016    Goals Met:  Independence with exercise equipment Exercise tolerated well No report of cardiac concerns or symptoms Strength training completed today  Goals Unmet:  Not Applicable  Comments: checkout 0915   Dr. Kathie Dike is Medical Director for [facility].

## 2020-07-20 ENCOUNTER — Other Ambulatory Visit: Payer: Self-pay

## 2020-07-20 ENCOUNTER — Encounter (HOSPITAL_COMMUNITY)
Admission: RE | Admit: 2020-07-20 | Discharge: 2020-07-20 | Disposition: A | Discharge status: Home / Self Care | Payer: Medicare Other | Source: Ambulatory Visit | Attending: Cardiology | Admitting: Cardiology

## 2020-07-20 DIAGNOSIS — Z951 Presence of aortocoronary bypass graft: Secondary | ICD-10-CM | POA: Diagnosis not present

## 2020-07-20 NOTE — Progress Notes (Signed)
Daily Session Note  Patient Details  Name: Guy Morris MRN: [id number] Date of Birth: [birth year] Referring Provider:   Flowsheet Row CARDIAC REHAB PHASE II ORIENTATION from 07/06/2020 in [place]  Referring Provider [name]      Encounter Date: 07/20/2020  Check In:  Session Check In - 07/20/20 0815      Check-In   Supervising physician immediately available to respond to emergencies [facility] MD immediately available    Physician(s) Dr. Harl Bowie    Location [facility]    Staff Present Cathren Harsh, MS, Exercise Physiologist;Pearlean Sabina Kris Mouton, MS, ACSM-CEP, Exercise Physiologist    Virtual Visit No    Medication changes reported     No    Fall or balance concerns reported    No    Tobacco Cessation No Change    Warm-up and Cool-down Performed as group-led instruction    Resistance Training Performed Yes    VAD Patient? No    PAD/SET Patient? No      Pain Assessment   Currently in Pain? No/denies    Multiple Pain Sites No           Capillary Blood Glucose: No results found for this or any previous visit (from the past 24 hour(s)).    Social History   Tobacco Use  Smoking Status Former Smoker  . Packs/day: 2.00  . Years: 37.00  . Pack years: 74.00  . Quit date: 06/11/2000  . Years since quitting: 20.1  Smokeless Tobacco Never Used  Tobacco Comment   quit smoking cigarettes 2002 and quit cigars 2016    Goals Met:  Independence with exercise equipment Exercise tolerated well No report of cardiac concerns or symptoms Strength training completed today  Goals Unmet:  Not Applicable  Comments: checkout time is 0915   Dr. Kathie Dike is Medical Director for [facility].

## 2020-07-20 NOTE — Progress Notes (Signed)
Cardiac Individual Treatment Plan  Patient Details  Name: Guy Morris MRN: [id number] Date of Birth: 03-Apr-1951 Referring Provider:   Flowsheet Row CARDIAC REHAB PHASE II ORIENTATION from 07/06/2020 in [place]  Referring Provider [name]      Initial Encounter Date:  Flowsheet Row CARDIAC REHAB PHASE II ORIENTATION from 07/06/2020 in [place]  Date 07/06/20      Visit Diagnosis: S/P CABG x 4  Patient's Home Medications on Admission:  Current Outpatient Medications:  .  ascorbic acid (VITAMIN C) 500 MG tablet, Take 500 mg by mouth daily., Disp: , Rfl:  .  aspirin EC 81 MG tablet, Take 81 mg by mouth daily. Swallow whole., Disp: , Rfl:  .  Ca Carbonate-Mag Hydroxide (ROLAIDS PO), Take 1 tablet by mouth 3 (three) times daily as needed (heartburn)., Disp: , Rfl:  .  diltiazem (CARDIZEM SR) 120 MG 12 hr capsule, Take 1 capsule (120 mg total) by mouth every 12 (twelve) hours., Disp: 60 capsule, Rfl: 3 .  ezetimibe (ZETIA) 10 MG tablet, Take 1 tablet (10 mg total) by mouth daily., Disp: 30 tablet, Rfl: 3 .  levothyroxine (EUTHYROX) 150 MCG tablet, Take 1 tablet (150 mcg total) by mouth daily., Disp: 90 tablet, Rfl: 1 .  meclizine (ANTIVERT) 12.5 MG tablet, Take 1 tablet (12.5 mg total) by mouth 3 (three) times daily as needed for dizziness., Disp: 30 tablet, Rfl: 2 .  metoprolol tartrate (LOPRESSOR) 25 MG tablet, Take 0.5 tablets (12.5 mg total) by mouth 2 (two) times daily., Disp: 30 tablet, Rfl: 3 .  nitroGLYCERIN (NITROSTAT) 0.4 MG SL tablet, Place 1 tablet (0.4 mg total) under the tongue every 5 (five) minutes as needed for chest pain., Disp: 90 tablet, Rfl: 3 .  omeprazole (PRILOSEC) 20 MG capsule, Take 20 mg by mouth daily as needed (uses OTC for heartburn)., Disp: , Rfl:  .  polyethylene glycol (MIRALAX / GLYCOLAX) 17 g packet, Take 17 g by mouth daily., Disp: 14 each, Rfl: 0 .  Turmeric Curcumin 500 MG CAPS, Take 1,000 mg by  mouth daily., Disp: , Rfl:  .  warfarin (COUMADIN) 5 MG tablet, Take 1 tablet (5 mg total) by mouth daily. (Patient taking differently: Take 5 mg by mouth daily. Take 32m on Sunday and Thursday, then 7.549mthe rest of the week), Disp: 30 tablet, Rfl: 3  Past Medical History: Past Medical History:  Diagnosis Date  . ABNORMAL TRANSAMINASE-LFT'S 08/04/2008   Qualifier: Diagnosis of  By: StFuller PlanD FACG, MaHarrellsHINITIS 05/06/2007   Qualifier: Diagnosis of  By: JoJenny ReichmannD, JaHunt Oris . Allergy   . BPH (benign prostatic hyperplasia) 04/28/2012  . COPD (chronic obstructive pulmonary disease) [place]  . Degenerative joint disease 04/23/2011  . ERECTILE DYSFUNCTION 05/06/2007   Qualifier: Diagnosis of  By: JoJenny ReichmannD, JaHunt Oris . GERD 05/06/2007   Qualifier: Diagnosis of  By: JoJenny ReichmannD, JaHunt Oris . HYPERLIPIDEMIA 05/06/2007   Qualifier: Diagnosis of  By: JoJenny ReichmannD, JaHunt Oris . HYPOTHYROIDISM 05/06/2007   Qualifier: Diagnosis of  By: JoJenny ReichmannD, JaHunt Oris . Insomnia 04/28/2012  . OSTEOARTHRITIS, KNEE, 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   Qualifier: Diagnosis of  By: JoJenny ReichmannD, JaHunt Oris . Personal history of colonic polyps 07/20/2008   Centricity Description: PERSONAL HX COLONIC POLYPS Qualifier: Diagnosis of  By: JaArdis HughsD, DaMelene PlanCentricity Description: COLONIC POLYPS, HX OF Qualifier: Diagnosis  of  By: Jenny Reichmann MD, Hunt Oris   . RLS (restless legs syndrome) 10/17/2010  . Urge incontinence 04/28/2012   [facility]ps per urology    Tobacco Use: Social History   Tobacco Use  Smoking Status Former Smoker  . Packs/day: 2.00  . Years: 37.00  . Pack years: 74.00  . Quit date: 06/11/2000  . Years since quitting: 20.1  Smokeless Tobacco Never Used  Tobacco Comment   quit smoking cigarettes 2002 and quit cigars 2016    Labs: Recent Review Flowsheet Data    Labs for ITP Cardiac and Pulmonary Rehab Latest Ref Rng & Units 04/30/2020 04/30/2020 05/01/2020 05/01/2020 05/01/2020   Cholestrol 0 - 200 mg/dL - - - - -    LDLCALC 0 - 99 mg/dL - - - - -   LDLDIRECT mg/dL - - - - -   HDL >40 mg/dL - - - - -   Trlycerides <150 mg/dL - - - - -   Hemoglobin A1c 4.8 - 5.6 % - - - - -   PHART 7.350 - 7.450 - - - - -   PCO2ART 32.0 - 48.0 mmHg - - - - -   HCO3 20.0 - 28.0 mmol/L - - - - -   TCO2 22 - 32 mmol/L - - - - -   ACIDBASEDEF 0.0 - 2.0 mmol/L - - - - -   O2SAT % 48.8 60.6 49.7 40.1 46.7      Capillary Blood Glucose: Lab Results  Component Value Date   GLUCAP 84 05/08/2020   GLUCAP 122 (H) 05/02/2020   GLUCAP 117 (H) 05/02/2020   GLUCAP 132 (H) 05/02/2020   GLUCAP 119 (H) 05/02/2020     Exercise Target Goals: Exercise Program Goal: Individual exercise prescription set using results from initial 6 min walk test and THRR while considering  patient's activity barriers and safety.   Exercise Prescription Goal: Starting with aerobic activity 30 plus minutes a day, 3 days per week for initial exercise prescription. Provide home exercise prescription and guidelines that participant acknowledges understanding prior to discharge.  Activity Barriers & Risk Stratification:  Activity Barriers & Cardiac Risk Stratification - 07/06/20 1258      Activity Barriers & Cardiac Risk Stratification   Activity Barriers Arthritis;Right Knee Replacement;Joint Problems;Shortness of Breath;Balance Concerns;History of Falls    Cardiac Risk Stratification High           6 Minute Walk:  6 Minute Walk    Row Name 07/06/20 1421         6 Minute Walk   Phase Initial     Distance 1300 feet     Walk Time 6 minutes     # of Rest Breaks 0     MPH 2.5     METS 2.99     RPE 12     VO2 Peak 10.49     Symptoms Yes (comment)     Comments SOB     Resting HR 68 bpm     Resting BP 118/74     Resting Oxygen Saturation  97 %     Exercise Oxygen Saturation  during 6 min walk 97 %     Max Ex. HR 83 bpm     Max Ex. BP 152/76     2 Minute Post BP 128/76            Oxygen Initial Assessment:   Oxygen  Re-Evaluation:   Oxygen Discharge (Final Oxygen Re-Evaluation):   Initial Exercise  Prescription:  Initial Exercise Prescription - 07/06/20 1400      Date of Initial Exercise RX and Referring Provider   Date 07/06/20    Referring Provider [name]    Expected Discharge Date 09/30/20      Recumbant Elliptical   Level 1    RPM 60    Minutes 39      Intensity   THRR 40-80% of Max Heartrate 61-122    Ratings of Perceived Exertion 11-13    Perceived Dyspnea 0-4      Resistance Training   Training Prescription Yes    Weight 3    Reps 10-15           Perform Capillary Blood Glucose checks as needed.  Exercise Prescription Changes:   Exercise Prescription Changes    Row Name 07/18/20 1000             Response to Exercise   Blood Pressure (Admit) 126/72       Blood Pressure (Exercise) 158/82       Blood Pressure (Exit) 122/78       Heart Rate (Admit) 71 bpm       Heart Rate (Exercise) 100 bpm       Heart Rate (Exit) 79 bpm       Rating of Perceived Exertion (Exercise) 13       Duration Continue with 30 min of aerobic exercise without signs/symptoms of physical distress.       Intensity THRR unchanged               Progression   Progression Continue to progress workloads to maintain intensity without signs/symptoms of physical distress.               Resistance Training   Training Prescription Yes       Weight 5 lbs       Reps 10-15       Time 10 Minutes               Arm Ergometer   Level 2       Minutes 22       METs 3.4               Recumbant Elliptical   Level 1       RPM 70       Minutes 17       METs 4.7              Exercise Comments:   Exercise Goals and Review:   Exercise Goals    Row Name 07/06/20 1424 07/18/20 1031           Exercise Goals   Increase Physical Activity Yes Yes      Intervention Provide advice, education, support and counseling about physical activity/exercise needs.;Develop an individualized exercise  prescription for aerobic and resistive training based on initial evaluation findings, risk stratification, comorbidities and participant's personal goals. Provide advice, education, support and counseling about physical activity/exercise needs.;Develop an individualized exercise prescription for aerobic and resistive training based on initial evaluation findings, risk stratification, comorbidities and participant's personal goals.      Expected Outcomes Short Term: Attend rehab on a regular basis to increase amount of physical activity.;Long Term: Add in home exercise to make exercise part of routine and to increase amount of physical activity.;Long Term: Exercising regularly at least 3-5 days a week. Short Term: Attend rehab on a regular basis to increase amount of physical activity.;Long Term: Add  in home exercise to make exercise part of routine and to increase amount of physical activity.;Long Term: Exercising regularly at least 3-5 days a week.      Increase Strength and Stamina Yes Yes      Intervention Provide advice, education, support and counseling about physical activity/exercise needs.;Develop an individualized exercise prescription for aerobic and resistive training based on initial evaluation findings, risk stratification, comorbidities and participant's personal goals. Provide advice, education, support and counseling about physical activity/exercise needs.;Develop an individualized exercise prescription for aerobic and resistive training based on initial evaluation findings, risk stratification, comorbidities and participant's personal goals.      Expected Outcomes Short Term: Increase workloads from initial exercise prescription for resistance, speed, and METs.;Short Term: Perform resistance training exercises routinely during rehab and add in resistance training at home;Long Term: Improve cardiorespiratory fitness, muscular endurance and strength as measured by increased METs and functional  capacity (6MWT) Short Term: Increase workloads from initial exercise prescription for resistance, speed, and METs.;Short Term: Perform resistance training exercises routinely during rehab and add in resistance training at home;Long Term: Improve cardiorespiratory fitness, muscular endurance and strength as measured by increased METs and functional capacity (6MWT)      Able to understand and use rate of perceived exertion (RPE) scale Yes Yes      Intervention Provide education and explanation on how to use RPE scale Provide education and explanation on how to use RPE scale      Expected Outcomes Short Term: Able to use RPE daily in rehab to express subjective intensity level;Long Term:  Able to use RPE to guide intensity level when exercising independently Short Term: Able to use RPE daily in rehab to express subjective intensity level;Long Term:  Able to use RPE to guide intensity level when exercising independently      Knowledge and understanding of Target Heart Rate Range (THRR) Yes Yes      Intervention Provide education and explanation of THRR including how the numbers were predicted and where they are located for reference Provide education and explanation of THRR including how the numbers were predicted and where they are located for reference      Expected Outcomes Short Term: Able to state/look up THRR;Long Term: Able to use THRR to govern intensity when exercising independently;Short Term: Able to use daily as guideline for intensity in rehab Short Term: Able to state/look up THRR;Long Term: Able to use THRR to govern intensity when exercising independently;Short Term: Able to use daily as guideline for intensity in rehab      Able to check pulse independently Yes Yes      Intervention Provide education and demonstration on how to check pulse in carotid and radial arteries.;Review the importance of being able to check your own pulse for safety during independent exercise Provide education and  demonstration on how to check pulse in carotid and radial arteries.;Review the importance of being able to check your own pulse for safety during independent exercise      Expected Outcomes Short Term: Able to explain why pulse checking is important during independent exercise;Long Term: Able to check pulse independently and accurately Short Term: Able to explain why pulse checking is important during independent exercise;Long Term: Able to check pulse independently and accurately      Understanding of Exercise Prescription Yes Yes      Intervention Provide education, explanation, and written materials on patient's individual exercise prescription Provide education, explanation, and written materials on patient's individual exercise prescription  Expected Outcomes Short Term: Able to explain program exercise prescription;Long Term: Able to explain home exercise prescription to exercise independently Short Term: Able to explain program exercise prescription;Long Term: Able to explain home exercise prescription to exercise independently             Exercise Goals Re-Evaluation :  Exercise Goals Re-Evaluation    [place] Name 07/18/20 1032             Exercise Goal Re-Evaluation   Exercise Goals Review Increase Physical Activity;Increase Strength and Stamina;Able to understand and use rate of perceived exertion (RPE) scale;Knowledge and understanding of Target Heart Rate Range (THRR);Able to check pulse independently;Understanding of Exercise Prescription       Comments Patient  has completed 4 exercise sessions. He tolerating exercise well, but complains of a little bit of knee pain. He says he is going to the doctor to see about a knee replacement. He had to switch to the arm ergometer for the second station because of his knee pain. He says this feels much better. He is progressing well and continues to ask to increase his intensities. He is enthusiastic about coming to rehab and is very pleasant  to be around. He is currently exercising at 4.7 METs on the Elliptical. Will contine to progress as able.       Expected Outcomes Through exercise at rehab and with a home exercise program, patient will reach their goals.               Discharge Exercise Prescription (Final Exercise Prescription Changes):  Exercise Prescription Changes - 07/18/20 1000      Response to Exercise   Blood Pressure (Admit) 126/72    Blood Pressure (Exercise) 158/82    Blood Pressure (Exit) 122/78    Heart Rate (Admit) 71 bpm    Heart Rate (Exercise) 100 bpm    Heart Rate (Exit) 79 bpm    Rating of Perceived Exertion (Exercise) 13    Duration Continue with 30 min of aerobic exercise without signs/symptoms of physical distress.    Intensity THRR unchanged      Progression   Progression Continue to progress workloads to maintain intensity without signs/symptoms of physical distress.      Resistance Training   Training Prescription Yes    Weight 5 lbs    Reps 10-15    Time 10 Minutes      Arm Ergometer   Level 2    Minutes 22    METs 3.4      Recumbant Elliptical   Level 1    RPM 70    Minutes 17    METs 4.7           Nutrition:  Target Goals: Understanding of nutrition guidelines, daily intake of sodium <1520m, cholesterol <2084m calories 30% from fat and 7% or less from saturated fats, daily to have 5 or more servings of fruits and vegetables.  Biometrics:  Pre Biometrics - 07/18/20 1031      Pre Biometrics   Weight 81.8 kg    BMI (Calculated) 27.43            Nutrition Therapy Plan and Nutrition Goals:  Nutrition Therapy & Goals - 07/13/20 0834      Personal Nutrition Goals   Comments Patient scored 61 on his diet assessment. He says he is following a low fat heart healthy diet. We will continue to provide heart healthy nutritional education through handouts.      Intervention Plan  Intervention Nutrition handout(s) given to patient.           Nutrition  Assessments:  Nutrition Assessments - 07/06/20 1313      MEDFICTS Scores   Pre Score 61          MEDIFICTS Score Key:  ?70 Need to make dietary changes   40-70 Heart Healthy Diet  ? 40 Therapeutic Level Cholesterol Diet   Picture Your Plate Scores:  <24 Unhealthy dietary pattern with much room for improvement.  41-50 Dietary pattern unlikely to meet recommendations for good health and room for improvement.  51-60 More healthful dietary pattern, with some room for improvement.   >60 Healthy dietary pattern, although there may be some specific behaviors that could be improved.    Nutrition Goals Re-Evaluation:   Nutrition Goals Discharge (Final Nutrition Goals Re-Evaluation):   Psychosocial: Target Goals: Acknowledge presence or absence of significant depression and/or stress, maximize coping skills, provide positive support system. Participant is able to verbalize types and ability to use techniques and skills needed for reducing stress and depression.  Initial Review & Psychosocial Screening:  Initial Psych Review & Screening - 07/06/20 1259      Initial Review   Current issues with Current Stress Concerns    Source of Stress Concerns Chronic Illness    Comments His INR levels and the fact that he is taking coumadin stresses him out. He does not want to take it, but has been compliant since his doctor said that he needs to take it.      Family Dynamics   Good Support System? Yes    Comments His wife and children support him. He has grand children support him as well. He often talks to his pastor about his problems as well. He has been reading the Bible a lot recently and feels like his stronger connection with the Reita Cliche has helped him cope better recently.      Barriers   Psychosocial barriers to participate in program The patient should benefit from training in stress management and relaxation.      Screening Interventions   Interventions Encouraged to exercise     Expected Outcomes Long Term goal: The participant improves quality of Life and PHQ9 Scores as seen by post scores and/or verbalization of changes;Short Term goal: Identification and review with participant of any Quality of Life or Depression concerns found by scoring the questionnaire.           Quality of Life Scores:  Quality of Life - 07/06/20 1425      Quality of Life   Select Quality of Life      Quality of Life Scores   Health/Function Pre 16 %    Socioeconomic Pre 19 %    Psych/Spiritual Pre 23.07 %    Family Pre 15 %    GLOBAL Pre 17.89 %          Scores of 19 and below usually indicate a poorer quality of life in these areas.  A difference of  2-3 points is a clinically meaningful difference.  A difference of 2-3 points in the total score of the Quality of Life Index has been associated with significant improvement in overall quality of life, self-image, physical symptoms, and general health in studies assessing change in quality of life.  PHQ-9: Recent Review Flowsheet Data    Depression screen [facility] 2/9 07/06/2020 02/22/2020 02/18/2020 02/12/2019 08/14/2018   Decreased Interest 1  0 0 0 0   Down, Depressed, Hopeless  0 0 0 0 0   PHQ - 2 Score 1 0 0 0 0   Altered sleeping 2 - - - -   Tired, decreased energy 2 - - - -   Change in appetite 0 - - - -   Feeling bad or failure about yourself  0 - - - -   Trouble concentrating 0 - - - -   Moving slowly or fidgety/restless 0 - - - -   Suicidal thoughts 0 - - - -   PHQ-9 Score 5 - - - -   Difficult doing work/chores Somewhat difficult - - - -     Interpretation of Total Score  Total Score Depression Severity:  1-4 = Minimal depression, 5-9 = Mild depression, 10-14 = Moderate depression, 15-19 = Moderately severe depression, 20-27 = Severe depression   Psychosocial Evaluation and Intervention:  Psychosocial Evaluation - 07/06/20 1435      Psychosocial Evaluation & Interventions   Interventions Stress management  education;Encouraged to exercise with the program and follow exercise prescription    Comments Patient is slightly stressed due to his toleration of Coumadin and his increased breathing. He is hoping that his doctor removes this from his medication list, as his INR keeps improving. He has no other identifiable psychosocial issues.    Expected Outcomes Patient will continue to not have any psychosocial issues.    Continue Psychosocial Services  No Follow up required           Psychosocial Re-Evaluation:  Psychosocial Re-Evaluation    [place] Name 07/13/20 [phone]             Psychosocial Re-Evaluation   Current issues with Current Stress Concerns       Comments Patient is new to the program completing 2 sessions. His current stress concerns are related to his health and taking coumadin. He is managing his stress and it is improving. He demonstrates a positive attitude regading his future. We will continue to monitor.       Expected Outcomes Patient will have no additional psychosocial issues identified and his stress will be managed or improved.       Interventions Stress management education;Encouraged to attend Cardiac Rehabilitation for the exercise;Relaxation education       Continue Psychosocial Services  No Follow up required               Initial Review   Source of Stress Concerns None Identified              Psychosocial Discharge (Final Psychosocial Re-Evaluation):  Psychosocial Re-Evaluation - 07/13/20 4403      Psychosocial Re-Evaluation   Current issues with Current Stress Concerns    Comments Patient is new to the program completing 2 sessions. His current stress concerns are related to his health and taking coumadin. He is managing his stress and it is improving. He demonstrates a positive attitude regading his future. We will continue to monitor.    Expected Outcomes Patient will have no additional psychosocial issues identified and his stress will be managed or improved.     Interventions Stress management education;Encouraged to attend Cardiac Rehabilitation for the exercise;Relaxation education    Continue Psychosocial Services  No Follow up required      Initial Review   Source of Stress Concerns None Identified           Vocational Rehabilitation: Provide vocational rehab assistance to qualifying candidates.   Vocational Rehab Evaluation & Intervention:  Vocational Rehab -  07/06/20 1310      Initial Vocational Rehab Evaluation & Intervention   Assessment shows need for Vocational Rehabilitation No      Vocational Rehab Re-Evaulation   Comments He is retired and has no interest in returning to work.           Education: Education Goals: Education classes will be provided on a weekly basis, covering required topics. Participant will state understanding/return demonstration of topics presented.  Learning Barriers/Preferences:  Learning Barriers/Preferences - 07/06/20 1303      Learning Barriers/Preferences   Learning Barriers Hearing    Learning Preferences Skilled Demonstration           Education Topics: Hypertension, Hypertension Reduction -Define heart disease and high blood pressure. Discus how high blood pressure affects the body and ways to reduce high blood pressure.   Exercise and Your Heart -Discuss why it is important to exercise, the FITT principles of exercise, normal and abnormal responses to exercise, and how to exercise safely.   Angina -Discuss definition of angina, causes of angina, treatment of angina, and how to decrease risk of having angina.   Cardiac Medications -Review what the following cardiac medications are used for, how they affect the body, and side effects that may occur when taking the medications.  Medications include Aspirin, Beta blockers, calcium channel blockers, ACE Inhibitors, angiotensin receptor blockers, diuretics, digoxin, and antihyperlipidemics.   Congestive Heart Failure -Discuss  the definition of CHF, how to live with CHF, the signs and symptoms of CHF, and how keep track of weight and sodium intake. Flowsheet Row CARDIAC REHAB PHASE II EXERCISE from 07/13/2020 in [place]  Date 07/13/20  Educator [facility]  Instruction Review Code 2- Demonstrated Understanding      Heart Disease and Intimacy -Discus the effect sexual activity has on the heart, how changes occur during intimacy as we age, and safety during sexual activity.   Smoking Cessation / COPD -Discuss different methods to quit smoking, the health benefits of quitting smoking, and the definition of COPD.   Nutrition I: Fats -Discuss the types of cholesterol, what cholesterol does to the heart, and how cholesterol levels can be controlled.   Nutrition II: Labels -Discuss the different components of food labels and how to read food label   Heart Parts/Heart Disease and PAD -Discuss the anatomy of the heart, the pathway of blood circulation through the heart, and these are affected by heart disease.   Stress I: Signs and Symptoms -Discuss the causes of stress, how stress may lead to anxiety and depression, and ways to limit stress.   Stress II: Relaxation -Discuss different types of relaxation techniques to limit stress.   Warning Signs of Stroke / TIA -Discuss definition of a stroke, what the signs and symptoms are of a stroke, and how to identify when someone is having stroke.   Knowledge Questionnaire Score:  Knowledge Questionnaire Score - 07/06/20 1310      Knowledge Questionnaire Score   Pre Score 21/24           Core Components/Risk Factors/Patient Goals at Admission:  Personal Goals and Risk Factors at Admission - 07/06/20 1304      Core Components/Risk Factors/Patient Goals on Admission    Weight Management Yes;Weight Maintenance    Intervention Weight Management: Develop a combined nutrition and exercise program designed to reach desired caloric intake,  while maintaining appropriate intake of nutrient and fiber, sodium and fats, and appropriate energy expenditure required for the weight goal.;Weight Management:  Provide education and appropriate resources to help participant work on and attain dietary goals.;Weight Management/Obesity: Establish reasonable short term and long term weight goals.;Obesity: Provide education and appropriate resources to help participant work on and attain dietary goals.    Expected Outcomes Short Term: Continue to assess and modify interventions until short term weight is achieved;Long Term: Adherence to nutrition and physical activity/exercise program aimed toward attainment of established weight goal;Weight Maintenance: Understanding of the daily nutrition guidelines, which includes 25-35% calories from fat, 7% or less cal from saturated fats, less than 258m cholesterol, less than 1.5gm of sodium, & 5 or more servings of fruits and vegetables daily;Weight Loss: Understanding of general recommendations for a balanced deficit meal plan, which promotes 1-2 lb weight loss per week and includes a negative energy balance of [phone] kcal/d;Understanding recommendations for meals to include 15-35% energy as protein, 25-35% energy from fat, 35-60% energy from carbohydrates, less than 2052mof dietary cholesterol, 20-35 gm of total fiber daily;Understanding of distribution of calorie intake throughout the day with the consumption of 4-5 meals/snacks;Weight Gain: Understanding of general recommendations for a high calorie, high protein meal plan that promotes weight gain by distributing calorie intake throughout the day with the consumption for 4-5 meals, snacks, and/or supplements    Improve shortness of breath with ADL's Yes    Intervention Provide education, individualized exercise plan and daily activity instruction to help decrease symptoms of SOB with activities of daily living.    Expected Outcomes Short Term: Improve  cardiorespiratory fitness to achieve a reduction of symptoms when performing ADLs;Long Term: Be able to perform more ADLs without symptoms or delay the onset of symptoms    Lipids Yes    Intervention Provide education and support for participant on nutrition & aerobic/resistive exercise along with prescribed medications to achieve LDL <7051mHDL >32m104m  Expected Outcomes Short Term: Participant states understanding of desired cholesterol values and is compliant with medications prescribed. Participant is following exercise prescription and nutrition guidelines.;Long Term: Cholesterol controlled with medications as prescribed, with individualized exercise RX and with personalized nutrition plan. Value goals: LDL < 70mg67mL > 40 mg.    Stress Yes    Intervention Offer individual and/or small group education and counseling on adjustment to heart disease, stress management and health-related lifestyle change. Teach and support self-help strategies.;Refer participants experiencing significant psychosocial distress to appropriate mental health specialists for further evaluation and treatment. When possible, include family members and significant others in education/counseling sessions.    Expected Outcomes Short Term: Participant demonstrates changes in health-related behavior, relaxation and other stress management skills, ability to obtain effective social support, and compliance with psychotropic medications if prescribed.;Long Term: Emotional wellbeing is indicated by absence of clinically significant psychosocial distress or social isolation.    Personal Goal Other Yes    Personal Goal He would like to play golf and fish again.    Intervention Attend cardiac rehab and continue with his home exercise program.    Expected Outcomes He will reach a MET level through exercise that allows him to golf again.           Core Components/Risk Factors/Patient Goals Review:   Goals and Risk Factor Review     Row Name 07/13/20 0840             Core Components/Risk Factors/Patient Goals Review   Personal Goals Review Other       Review Patient is new to the program completing 2 sessions. He was referred to  cardiac rehab due S/P CABGx4. He has some risk factors for CAD and is participating in the program for risk modication. His personal goal for the program are to play golf again and fish again. We will continue to monitor as he works toward [employer] these goals.       Expected Outcomes Patient will complete the program meeting both personal and program goals.              Core Components/Risk Factors/Patient Goals at Discharge (Final Review):   Goals and Risk Factor Review - 07/13/20 0840      Core Components/Risk Factors/Patient Goals Review   Personal Goals Review Other    Review Patient is new to the program completing 2 sessions. 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 He has some risk factors for CAD and is participating in the program for risk modication. His personal goal for the program are to play golf again and fish again. We will continue to monitor as he works toward [employer] these goals.    Expected Outcomes Patient will complete the program meeting both personal and program goals.           ITP Comments:   Comments: ITP REVIEW Pt is making expected progress toward Cardiac Rehab goals after completing 5 sessions. Recommend continued exercise, life style modification, education, and increased stamina and strength.

## 2020-07-22 ENCOUNTER — Encounter (HOSPITAL_COMMUNITY)
Admission: RE | Admit: 2020-07-22 | Discharge: 2020-07-22 | Disposition: A | Discharge status: Home / Self Care | Payer: Medicare Other | Source: Ambulatory Visit | Attending: Cardiology | Admitting: Cardiology

## 2020-07-22 ENCOUNTER — Other Ambulatory Visit: Payer: Self-pay

## 2020-07-22 DIAGNOSIS — Z951 Presence of aortocoronary bypass graft: Secondary | ICD-10-CM

## 2020-07-22 NOTE — Progress Notes (Signed)
Daily Session Note  Patient Details  Name: Guy Morris MRN: [id number] Date of Birth: 17-Jul-1950 Referring Provider:   Flowsheet Row CARDIAC REHAB PHASE II ORIENTATION from 07/06/2020 in [place]  Referring Provider [name]      Encounter Date: 07/22/2020  Check In:  Session Check In - 07/22/20 0815      Check-In   Supervising physician immediately available to respond to emergencies [facility] MD immediately available    Physician(s) Dr. Melene Muller    Location [facility]    Staff Present Cathren Harsh, MS, Exercise Physiologist;Dalton Kris Mouton, MS, ACSM-CEP, Exercise Physiologist    Virtual Visit No    Medication changes reported     No    Fall or balance concerns reported    No    Tobacco Cessation No Change    Warm-up and Cool-down Performed as group-led instruction    Resistance Training Performed Yes    VAD Patient? No    PAD/SET Patient? No      Pain Assessment   Currently in Pain? No/denies    Multiple Pain Sites No           Capillary Blood Glucose: No results found for this or any previous visit (from the past 24 hour(s)).    Social History   Tobacco Use  Smoking Status Former Smoker  . Packs/day: 2.00  . Years: 37.00  . Pack years: 74.00  . Quit date: 06/11/2000  . Years since quitting: 20.1  Smokeless Tobacco Never Used  Tobacco Comment   quit smoking cigarettes 2002 and quit cigars 2016    Goals Met:  Independence with exercise equipment Exercise tolerated well No report of cardiac concerns or symptoms Strength training completed today  Goals Unmet:  Not Applicable  Comments: check out 0915   Dr. Kathie Dike is Medical Director for [facility].

## 2020-07-25 ENCOUNTER — Other Ambulatory Visit: Payer: Self-pay

## 2020-07-25 ENCOUNTER — Encounter (HOSPITAL_COMMUNITY)
Admission: RE | Admit: 2020-07-25 | Discharge: 2020-07-25 | Disposition: A | Discharge status: Home / Self Care | Payer: Medicare Other | Source: Ambulatory Visit | Attending: Cardiology | Admitting: Cardiology

## 2020-07-25 ENCOUNTER — Ambulatory Visit (INDEPENDENT_AMBULATORY_CARE_PROVIDER_SITE_OTHER): Payer: Medicare Other | Admitting: *Deleted

## 2020-07-25 DIAGNOSIS — Z951 Presence of aortocoronary bypass graft: Secondary | ICD-10-CM

## 2020-07-25 DIAGNOSIS — I824Y9 Acute embolism and thrombosis of unspecified deep veins of unspecified proximal lower extremity: Secondary | ICD-10-CM | POA: Diagnosis not present

## 2020-07-25 DIAGNOSIS — Z7901 Long term (current) use of anticoagulants: Secondary | ICD-10-CM | POA: Diagnosis not present

## 2020-07-25 DIAGNOSIS — Z5181 Encounter for therapeutic drug level monitoring: Secondary | ICD-10-CM

## 2020-07-25 LAB — POCT INR: INR: 2.5 (ref 2.0–3.0)

## 2020-07-25 NOTE — Progress Notes (Signed)
Daily Session Note  Patient Details  Name: Guy Morris MRN: [id number] Date of Birth: 20-Feb-1951 Referring Provider:   Flowsheet Row CARDIAC REHAB PHASE II ORIENTATION from 07/06/2020 in [place]  Referring Provider [name]      Encounter Date: 07/25/2020  Check In:  Session Check In - 07/25/20 0815      Check-In   Supervising physician immediately available to respond to emergencies [facility] MD immediately available    Physician(s) Dr. Johnsie Cancel    Location [facility]    Staff Present Cathren Harsh, MS, Exercise Physiologist;Dalton Kris Mouton, MS, ACSM-CEP, Exercise Physiologist    Virtual Visit No    Medication changes reported     No    Fall or balance concerns reported    No    Tobacco Cessation No Change    Warm-up and Cool-down Performed as group-led instruction    Resistance Training Performed Yes    VAD Patient? No    PAD/SET Patient? No      Pain Assessment   Currently in Pain? No/denies    Multiple Pain Sites No           Capillary Blood Glucose: No results found for this or any previous visit (from the past 24 hour(s)).    Social History   Tobacco Use  Smoking Status Former Smoker  . Packs/day: 2.00  . Years: 37.00  . Pack years: 74.00  . Quit date: 06/11/2000  . Years since quitting: 20.1  Smokeless Tobacco Never Used  Tobacco Comment   quit smoking cigarettes 2002 and quit cigars 2016    Goals Met:  Independence with exercise equipment Exercise tolerated well No report of cardiac concerns or symptoms Strength training completed today  Goals Unmet:  Not Applicable  Comments: checkout time is 0915   Dr. Kathie Dike is Medical Director for [facility].

## 2020-07-25 NOTE — Patient Instructions (Signed)
Warfarin changed to 5mg  tablet Continue 1 1/2 tablets daily except 1 tablet on Sundays and Thursdays  Recheck INR in 3 wks

## 2020-07-27 ENCOUNTER — Other Ambulatory Visit: Payer: Self-pay

## 2020-07-27 ENCOUNTER — Encounter (HOSPITAL_COMMUNITY)
Admission: RE | Admit: 2020-07-27 | Discharge: 2020-07-27 | Disposition: A | Discharge status: Home / Self Care | Payer: Medicare Other | Source: Ambulatory Visit | Attending: Cardiology | Admitting: Cardiology

## 2020-07-27 DIAGNOSIS — Z951 Presence of aortocoronary bypass graft: Secondary | ICD-10-CM | POA: Diagnosis not present

## 2020-07-27 NOTE — Progress Notes (Signed)
Daily Session Note  Patient Details  Name: Guy Morris MRN: [id number] Date of Birth: Jun 18, 1950 Referring Provider:   Flowsheet Row CARDIAC REHAB PHASE II ORIENTATION from 07/06/2020 in [place]  Referring Provider [name]      Encounter Date: 07/27/2020  Check In:  Session Check In - 07/27/20 0815      Check-In   Supervising physician immediately available to respond to emergencies [facility] MD immediately available    Physician(s) Dr. Johnsie Cancel    Location [facility]    Staff Present Cathren Harsh, MS, Exercise Physiologist;Meli Faley Kris Mouton, MS, ACSM-CEP, Exercise Physiologist    Virtual Visit No    Medication changes reported     No    Fall or balance concerns reported    No    Tobacco Cessation No Change    Warm-up and Cool-down Performed as group-led instruction    Resistance Training Performed Yes    VAD Patient? No    PAD/SET Patient? No      Pain Assessment   Currently in Pain? No/denies    Multiple Pain Sites No           Capillary Blood Glucose: No results found for this or any previous visit (from the past 24 hour(s)).    Social History   Tobacco Use  Smoking Status Former Smoker  . Packs/day: 2.00  . Years: 37.00  . Pack years: 74.00  . Quit date: 06/11/2000  . Years since quitting: 20.1  Smokeless Tobacco Never Used  Tobacco Comment   quit smoking cigarettes 2002 and quit cigars 2016    Goals Met:  Independence with exercise equipment Exercise tolerated well No report of cardiac concerns or symptoms Strength training completed today  Goals Unmet:  Not Applicable  Comments: checkout time is 0915   Dr. Kathie Dike is Medical Director for [facility].

## 2020-07-29 ENCOUNTER — Other Ambulatory Visit: Payer: Self-pay

## 2020-07-29 ENCOUNTER — Encounter (HOSPITAL_COMMUNITY)
Admission: RE | Admit: 2020-07-29 | Discharge: 2020-07-29 | Disposition: A | Discharge status: Home / Self Care | Payer: Medicare Other | Source: Ambulatory Visit | Attending: Cardiology | Admitting: Cardiology

## 2020-07-29 DIAGNOSIS — Z951 Presence of aortocoronary bypass graft: Secondary | ICD-10-CM | POA: Diagnosis not present

## 2020-07-29 NOTE — Progress Notes (Signed)
Daily Session Note  Patient Details  Name: Guy Morris MRN: [id number] Date of Birth: 1950/11/26 Referring Provider:   Flowsheet Row CARDIAC REHAB PHASE II ORIENTATION from 07/06/2020 in [place]  Referring Provider [name]      Encounter Date: 07/29/2020  Check In:  Session Check In - 07/29/20 0815      Check-In   Supervising physician immediately available to respond to emergencies [facility] MD immediately available    Physician(s) Dr. Johnsie Cancel    Location [facility]    Staff Present Cathren Harsh, MS, Exercise Physiologist;Soul Deveney Kris Mouton, MS, ACSM-CEP, Exercise Physiologist    Virtual Visit No    Medication changes reported     No    Fall or balance concerns reported    No    Tobacco Cessation No Change    Warm-up and Cool-down Performed as group-led instruction    Resistance Training Performed Yes    VAD Patient? No    PAD/SET Patient? No      Pain Assessment   Currently in Pain? No/denies    Multiple Pain Sites No           Capillary Blood Glucose: No results found for this or any previous visit (from the past 24 hour(s)).    Social History   Tobacco Use  Smoking Status Former Smoker  . Packs/day: 2.00  . Years: 37.00  . Pack years: 74.00  . Quit date: 06/11/2000  . Years since quitting: 20.1  Smokeless Tobacco Never Used  Tobacco Comment   quit smoking cigarettes 2002 and quit cigars 2016    Goals Met:  Independence with exercise equipment Exercise tolerated well No report of cardiac concerns or symptoms Strength training completed today  Goals Unmet:  Not Applicable  Comments: checkout time is 0915   Dr. Kathie Dike is Medical Director for [facility].

## 2020-07-31 DIAGNOSIS — I6529 Occlusion and stenosis of unspecified carotid artery: Secondary | ICD-10-CM | POA: Insufficient documentation
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
## 2020-08-01 ENCOUNTER — Encounter (HOSPITAL_COMMUNITY)
Admission: RE | Admit: 2020-08-01 | Discharge: 2020-08-01 | Disposition: A | Discharge status: Home / Self Care | Payer: Medicare Other | Source: Ambulatory Visit | Attending: Cardiology | Admitting: Cardiology

## 2020-08-01 ENCOUNTER — Ambulatory Visit (INDEPENDENT_AMBULATORY_CARE_PROVIDER_SITE_OTHER): Payer: Medicare Other | Admitting: Cardiology

## 2020-08-01 ENCOUNTER — Other Ambulatory Visit: Payer: Self-pay

## 2020-08-01 ENCOUNTER — Ambulatory Visit: Payer: Self-pay | Admitting: *Deleted

## 2020-08-01 ENCOUNTER — Encounter: Payer: Self-pay | Admitting: Cardiology

## 2020-08-01 VITALS — Wt 178.8 lb

## 2020-08-01 VITALS — BP 120/68 | HR 60 | Ht 68.0 in | Wt 182.0 lb

## 2020-08-01 DIAGNOSIS — I480 Paroxysmal atrial fibrillation: Secondary | ICD-10-CM

## 2020-08-01 DIAGNOSIS — Z951 Presence of aortocoronary bypass graft: Secondary | ICD-10-CM | POA: Diagnosis not present

## 2020-08-01 DIAGNOSIS — I2510 Atherosclerotic heart disease of native coronary artery without angina pectoris: Secondary | ICD-10-CM

## 2020-08-01 DIAGNOSIS — I6529 Occlusion and stenosis of unspecified carotid artery: Secondary | ICD-10-CM

## 2020-08-01 DIAGNOSIS — E785 Hyperlipidemia, unspecified: Secondary | ICD-10-CM

## 2020-08-01 DIAGNOSIS — R5383 Other fatigue: Secondary | ICD-10-CM

## 2020-08-01 MED ORDER — METOPROLOL TARTRATE 50 MG PO TABS
50.0000 mg | ORAL_TABLET | Freq: Two times a day (BID) | ORAL | 3 refills | Status: DC
Start: 2020-08-01 — End: 2021-10-02

## 2020-08-01 NOTE — Progress Notes (Signed)
Daily Session Note  Patient Details  Name: Guy Morris MRN: [id number] Date of Birth: 1951/01/26 Referring Provider:   Flowsheet Row CARDIAC REHAB PHASE II ORIENTATION from 07/06/2020 in [place]  Referring Provider [name]      Encounter Date: 08/01/2020  Check In:  Session Check In - 08/01/20 0815      Check-In   Supervising physician immediately available to respond to emergencies [facility] MD immediately available    Physician(s) Dr. Melene Muller    Location [facility]    Staff Present Cathren Harsh, MS, Exercise Physiologist;Dalton Kris Mouton, MS, ACSM-CEP, Exercise Physiologist    Virtual Visit No    Medication changes reported     No    Fall or balance concerns reported    No    Tobacco Cessation No Change    Warm-up and Cool-down Performed as group-led instruction    Resistance Training Performed Yes    VAD Patient? No    PAD/SET Patient? No      Pain Assessment   Currently in Pain? No/denies    Multiple Pain Sites No           Capillary Blood Glucose: No results found for this or any previous visit (from the past 24 hour(s)).    Social History   Tobacco Use  Smoking Status Former Smoker  . Packs/day: 2.00  . Years: 37.00  . Pack years: 74.00  . Quit date: 06/11/2000  . Years since quitting: 20.1  Smokeless Tobacco Never Used  Tobacco Comment   quit smoking cigarettes 2002 and quit cigars 2016    Goals Met:  Independence with exercise equipment Exercise tolerated well No report of cardiac concerns or symptoms Strength training completed today  Goals Unmet:  Not Applicable  Comments: check out 0915   Dr. Kathie Dike is Medical Director for [facility].

## 2020-08-01 NOTE — Patient Instructions (Signed)
Medication Instructions:  STOP cardizem STOP coumadin INCREASE metoprolol tartrate (Lopressor) to 50 mg two times daily START aspirin 81 mg daily  *If you need a refill on your cardiac medications before your next appointment, please call your pharmacy*  Follow-Up: At [facility], you and your health needs are our priority.  As part of our continuing mission to provide you with exceptional heart care, we have created designated Provider Care Teams.  These Care Teams include your primary Cardiologist (physician) and Advanced Practice Providers (APPs -  Physician Assistants and Nurse Practitioners) who all work together to provide you with the care you need, when you need it.  We recommend signing up for the patient portal called "MyChart".  Sign up information is provided on this After Visit Summary.  MyChart is used to connect with patients for Virtual Visits (Telemedicine).  Patients are able to view lab/test results, encounter notes, upcoming appointments, etc.  Non-urgent messages can be sent to your provider as well.   To learn more about what you can do with MyChart, go to [email or web address].    Your next appointment:   2 month(s)  The format for your next appointment:   In Person  Provider:   Minus Breeding, MD

## 2020-08-03 ENCOUNTER — Other Ambulatory Visit: Payer: Self-pay

## 2020-08-03 ENCOUNTER — Encounter (HOSPITAL_COMMUNITY)
Admission: RE | Admit: 2020-08-03 | Discharge: 2020-08-03 | Disposition: A | Discharge status: Home / Self Care | Payer: Medicare Other | Source: Ambulatory Visit | Attending: Cardiology | Admitting: Cardiology

## 2020-08-03 DIAGNOSIS — Z951 Presence of aortocoronary bypass graft: Secondary | ICD-10-CM | POA: Diagnosis not present

## 2020-08-03 NOTE — Progress Notes (Signed)
Daily Session Note  Patient Details  Name: Guy Morris MRN: [id number] Date of Birth: 1950/06/14 Referring Provider:   Flowsheet Row CARDIAC REHAB PHASE II ORIENTATION from 07/06/2020 in [place]  Referring Provider [name]      Encounter Date: 08/03/2020  Check In:  Session Check In - 08/03/20 0841      Check-In   Supervising physician immediately available to respond to emergencies [facility] MD immediately available    Physician(s) Dr. Domenic Polite    Location [facility]    Staff Present Cathren Harsh, MS, Exercise Physiologist;Debra Wynetta Emery, RN, BSN    Virtual Visit No    Medication changes reported     No    Fall or balance concerns reported    No    Tobacco Cessation No Change    Warm-up and Cool-down Performed as group-led instruction    Resistance Training Performed Yes    VAD Patient? No    PAD/SET Patient? No      Pain Assessment   Currently in Pain? No/denies    Multiple Pain Sites No           Capillary Blood Glucose: No results found for this or any previous visit (from the past 24 hour(s)).    Social History   Tobacco Use  Smoking Status Former Smoker  . Packs/day: 2.00  . Years: 37.00  . Pack years: 74.00  . Quit date: 06/11/2000  . Years since quitting: 20.1  Smokeless Tobacco Never Used  Tobacco Comment   quit smoking cigarettes 2002 and quit cigars 2016    Goals Met:  Independence with exercise equipment Exercise tolerated well No report of cardiac concerns or symptoms Strength training completed today  Goals Unmet:  Not Applicable  Comments: check out 0915   Dr. Kathie Dike is Medical Director for [facility].

## 2020-08-05 ENCOUNTER — Other Ambulatory Visit: Payer: Self-pay

## 2020-08-05 ENCOUNTER — Encounter (HOSPITAL_COMMUNITY)
Admission: RE | Admit: 2020-08-05 | Discharge: 2020-08-05 | Disposition: A | Discharge status: Home / Self Care | Payer: Medicare Other | Source: Ambulatory Visit | Attending: Cardiology | Admitting: Cardiology

## 2020-08-05 DIAGNOSIS — Z951 Presence of aortocoronary bypass graft: Secondary | ICD-10-CM

## 2020-08-05 NOTE — Progress Notes (Signed)
Daily Session Note  Patient Details  Name: Guy Morris MRN: [id number] Date of Birth: [birth year] Referring Provider:   Flowsheet Row CARDIAC REHAB PHASE II ORIENTATION from 07/06/2020 in [place]  Referring Provider [name]      Encounter Date: 08/05/2020  Check In:  Session Check In - 08/05/20 0815      Check-In   Supervising physician immediately available to respond to emergencies [facility] MD immediately available    Physician(s) Dr. Domenic Polite    Location [facility]    Staff Present Cathren Harsh, MS, Exercise Physiologist;Dalton Kris Mouton, MS, ACSM-CEP, Exercise Physiologist    Virtual Visit No    Medication changes reported     No    Fall or balance concerns reported    No    Tobacco Cessation No Change    Warm-up and Cool-down Performed as group-led instruction    Resistance Training Performed Yes    VAD Patient? No    PAD/SET Patient? No      Pain Assessment   Currently in Pain? No/denies    Multiple Pain Sites No           Capillary Blood Glucose: No results found for this or any previous visit (from the past 24 hour(s)).    Social History   Tobacco Use  Smoking Status Former Smoker  . Packs/day: 2.00  . Years: 37.00  . Pack years: 74.00  . Quit date: 06/11/2000  . Years since quitting: 20.1  Smokeless Tobacco Never Used  Tobacco Comment   quit smoking cigarettes 2002 and quit cigars 2016    Goals Met:  Independence with exercise equipment Exercise tolerated well No report of cardiac concerns or symptoms Strength training completed today  Goals Unmet:  Not Applicable  Comments: check out 0915   Dr. Kathie Dike is Medical Director for [facility].

## 2020-08-08 ENCOUNTER — Other Ambulatory Visit: Payer: Self-pay

## 2020-08-08 ENCOUNTER — Encounter (HOSPITAL_COMMUNITY)
Admission: RE | Admit: 2020-08-08 | Discharge: 2020-08-08 | Disposition: A | Discharge status: Home / Self Care | Payer: Medicare Other | Source: Ambulatory Visit | Attending: Cardiology | Admitting: Cardiology

## 2020-08-08 DIAGNOSIS — Z951 Presence of aortocoronary bypass graft: Secondary | ICD-10-CM

## 2020-08-08 NOTE — Progress Notes (Signed)
Daily Session Note  Patient Details  Name: Guy Morris MRN: [id number] Date of Birth: 09/25/1950 Referring Provider:   Flowsheet Row CARDIAC REHAB PHASE II ORIENTATION from 07/06/2020 in [place]  Referring Provider [name]      Encounter Date: 08/08/2020  Check In:  Session Check In - 08/08/20 0815      Check-In   Supervising physician immediately available to respond to emergencies [facility] MD immediately available    Physician(s) Dr. Harl Bowie    Location [facility]    Staff Present Cathren Harsh, MS, Exercise Physiologist;Dalton Kris Mouton, MS, ACSM-CEP, Exercise Physiologist    Virtual Visit No    Medication changes reported     No    Fall or balance concerns reported    No    Tobacco Cessation No Change    Warm-up and Cool-down Performed as group-led instruction    Resistance Training Performed Yes    VAD Patient? No    PAD/SET Patient? No      Pain Assessment   Currently in Pain? No/denies    Multiple Pain Sites No           Capillary Blood Glucose: No results found for this or any previous visit (from the past 24 hour(s)).    Social History   Tobacco Use  Smoking Status Former Smoker  . Packs/day: 2.00  . Years: 37.00  . Pack years: 74.00  . Quit date: 06/11/2000  . Years since quitting: 20.1  Smokeless Tobacco Never Used  Tobacco Comment   quit smoking cigarettes 2002 and quit cigars 2016    Goals Met:  Independence with exercise equipment Exercise tolerated well No report of cardiac concerns or symptoms Strength training completed today  Goals Unmet:  Not Applicable  Comments: check out 0915   Dr. Kathie Dike is Medical Director for [facility].

## 2020-08-10 ENCOUNTER — Other Ambulatory Visit: Payer: Self-pay

## 2020-08-10 ENCOUNTER — Encounter (HOSPITAL_COMMUNITY)
Admission: RE | Admit: 2020-08-10 | Discharge: 2020-08-10 | Disposition: A | Discharge status: Home / Self Care | Payer: Medicare Other | Source: Ambulatory Visit | Attending: Cardiology | Admitting: Cardiology

## 2020-08-10 DIAGNOSIS — Z951 Presence of aortocoronary bypass graft: Secondary | ICD-10-CM | POA: Insufficient documentation

## 2020-08-10 NOTE — Progress Notes (Signed)
Daily Session Note  Patient Details  Name: Guy Morris MRN: [id number] Date of Birth: 1951/04/22 Referring Provider:   Flowsheet Row CARDIAC REHAB PHASE II ORIENTATION from 07/06/2020 in [place]  Referring Provider [name]      Encounter Date: 08/10/2020  Check In:  Session Check In - 08/10/20 0815      Check-In   Supervising physician immediately available to respond to emergencies [facility] MD immediately available    Physician(s) Dr. Harl Bowie    Location [facility]    Staff Present Cathren Harsh, MS, Exercise Physiologist;Dalton Kris Mouton, MS, ACSM-CEP, Exercise Physiologist    Virtual Visit No    Medication changes reported     No    Fall or balance concerns reported    No    Tobacco Cessation No Change    Warm-up and Cool-down Performed as group-led instruction    Resistance Training Performed Yes    VAD Patient? No    PAD/SET Patient? No      Pain Assessment   Currently in Pain? No/denies    Multiple Pain Sites No           Capillary Blood Glucose: No results found for this or any previous visit (from the past 24 hour(s)).    Social History   Tobacco Use  Smoking Status Former Smoker  . Packs/day: 2.00  . Years: 37.00  . Pack years: 74.00  . Quit date: 06/11/2000  . Years since quitting: 20.1  Smokeless Tobacco Never Used  Tobacco Comment   quit smoking cigarettes 2002 and quit cigars 2016    Goals Met:  Independence with exercise equipment Exercise tolerated well No report of cardiac concerns or symptoms Strength training completed today  Goals Unmet:  Not Applicable  Comments: checkout 0915   Dr. Kathie Dike is Medical Director for [facility] Pul[facility] Re[facility].

## 2020-08-12 ENCOUNTER — Other Ambulatory Visit: Payer: Self-pay

## 2020-08-12 ENCOUNTER — Other Ambulatory Visit: Payer: Self-pay | Admitting: Internal Medicine

## 2020-08-12 ENCOUNTER — Encounter (HOSPITAL_COMMUNITY)
Admission: RE | Admit: 2020-08-12 | Discharge: 2020-08-12 | Disposition: A | Discharge status: Home / Self Care | Payer: Medicare Other | Source: Ambulatory Visit | Attending: Cardiology | Admitting: Cardiology

## 2020-08-12 DIAGNOSIS — Z951 Presence of aortocoronary bypass graft: Secondary | ICD-10-CM | POA: Diagnosis not present

## 2020-08-12 NOTE — Progress Notes (Signed)
Daily Session Note  Patient Details  Name: Guy Morris MRN: [id number] Date of Birth: [birth year] Referring Provider:   Flowsheet Row CARDIAC REHAB PHASE II ORIENTATION from 07/06/2020 in [place]  Referring Provider [name]      Encounter Date: 08/12/2020  Check In:  Session Check In - 08/12/20 0815      Check-In   Supervising physician immediately available to respond to emergencies [facility] MD immediately available    Physician(s) Dr. Domenic Polite    Location [facility]    Staff Present Cathren Harsh, MS, Exercise Physiologist;Dalton Kris Mouton, MS, ACSM-CEP, Exercise Physiologist    Virtual Visit No    Medication changes reported     No    Fall or balance concerns reported    No    Tobacco Cessation No Change    Warm-up and Cool-down Performed as group-led instruction    Resistance Training Performed Yes    VAD Patient? No    PAD/SET Patient? No      Pain Assessment   Currently in Pain? No/denies    Multiple Pain Sites No           Capillary Blood Glucose: No results found for this or any previous visit (from the past 24 hour(s)).    Social History   Tobacco Use  Smoking Status Former Smoker  . Packs/day: 2.00  . Years: 37.00  . Pack years: 74.00  . Quit date: 06/11/2000  . Years since quitting: 20.1  Smokeless Tobacco Never Used  Tobacco Comment   quit smoking cigarettes 2002 and quit cigars 2016    Goals Met:  Independence with exercise equipment Exercise tolerated well No report of cardiac concerns or symptoms Strength training completed today  Goals Unmet:  Not Applicable  Comments: check out 0915   Dr. Kathie Dike is Medical Director for [facility] Pul[facility] Re[facility].

## 2020-08-13 NOTE — Telephone Encounter (Signed)
Please refill as per office routine med refill policy (all routine meds refilled for 3 mo or monthly per pt preference up to one year from last visit, then month to month grace period for 3 mo, then further med refills will have to be denied)  

## 2020-08-15 ENCOUNTER — Other Ambulatory Visit: Payer: Self-pay

## 2020-08-15 ENCOUNTER — Encounter (HOSPITAL_COMMUNITY)
Admission: RE | Admit: 2020-08-15 | Discharge: 2020-08-15 | Disposition: A | Discharge status: Home / Self Care | Payer: Medicare Other | Source: Ambulatory Visit | Attending: Cardiology | Admitting: Cardiology

## 2020-08-15 VITALS — Wt 177.7 lb

## 2020-08-15 DIAGNOSIS — Z951 Presence of aortocoronary bypass graft: Secondary | ICD-10-CM | POA: Diagnosis not present

## 2020-08-15 NOTE — Progress Notes (Signed)
Daily Session Note  Patient Details  Name: Guy Morris MRN: [id number] Date of Birth: 03-Jan-1951 Referring Provider:   Flowsheet Row CARDIAC REHAB PHASE II ORIENTATION from 07/06/2020 in [place]  Referring Provider [name]      Encounter Date: 08/15/2020  Check In:  Session Check In - 08/15/20 0815      Check-In   Supervising physician immediately available to respond to emergencies [facility] MD immediately available    Physician(s) Dr. Domenic Polite    Location [facility]    Staff Present Cathren Harsh, MS, Exercise Physiologist;Dalton Kris Mouton, MS, ACSM-CEP, Exercise Physiologist    Virtual Visit No    Medication changes reported     No    Fall or balance concerns reported    No    Tobacco Cessation No Change    Warm-up and Cool-down Performed as group-led instruction    Resistance Training Performed Yes    VAD Patient? No    PAD/SET Patient? No      Pain Assessment   Currently in Pain? No/denies    Multiple Pain Sites No           Capillary Blood Glucose: No results found for this or any previous visit (from the past 24 hour(s)).    Social History   Tobacco Use  Smoking Status Former Smoker  . Packs/day: 2.00  . Years: 37.00  . Pack years: 74.00  . Quit date: 06/11/2000  . Years since quitting: 20.1  Smokeless Tobacco Never Used  Tobacco Comment   quit smoking cigarettes 2002 and quit cigars 2016    Goals Met:  Independence with exercise equipment Exercise tolerated well No report of cardiac concerns or symptoms Strength training completed today  Goals Unmet:  Not Applicable  Comments: check out 0915   Dr. Kathie Dike is Medical Director for [facility] Pul[facility] Re[facility].

## 2020-08-17 ENCOUNTER — Other Ambulatory Visit: Payer: Self-pay

## 2020-08-17 ENCOUNTER — Encounter (HOSPITAL_COMMUNITY)
Admission: RE | Admit: 2020-08-17 | Discharge: 2020-08-17 | Disposition: A | Discharge status: Home / Self Care | Payer: Medicare Other | Source: Ambulatory Visit | Attending: Cardiology | Admitting: Cardiology

## 2020-08-17 DIAGNOSIS — Z951 Presence of aortocoronary bypass graft: Secondary | ICD-10-CM

## 2020-08-17 NOTE — Progress Notes (Signed)
Cardiac Individual Treatment Plan  Patient Details  Name: Guy Morris MRN: [id number] Date of Birth: 1950-07-16 Referring Provider:   Flowsheet Row CARDIAC REHAB PHASE II ORIENTATION from 07/06/2020 in [place]  Referring Provider [name]      Initial Encounter Date:  Flowsheet Row CARDIAC REHAB PHASE II ORIENTATION from 07/06/2020 in [place]  Date 07/06/20      Visit Diagnosis: S/P CABG x 4  Patient's Home Medications on Admission:  Current Outpatient Medications:  .  ascorbic acid (VITAMIN C) 500 MG tablet, Take 500 mg by mouth daily., Disp: , Rfl:  .  aspirin EC 81 MG tablet, Take 81 mg by mouth daily. Swallow whole., Disp: , Rfl:  .  Ca Carbonate-Mag Hydroxide (ROLAIDS PO), Take 1 tablet by mouth 3 (three) times daily as needed (heartburn)., Disp: , Rfl:  .  ezetimibe (ZETIA) 10 MG tablet, Take 1 tablet (10 mg total) by mouth daily. (Patient not taking: Reported on 08/01/2020), Disp: 30 tablet, Rfl: 3 .  levothyroxine (SYNTHROID) 150 MCG tablet, Take 1 tablet by mouth once daily, Disp: 90 tablet, Rfl: 0 .  meclizine (ANTIVERT) 12.5 MG tablet, Take 1 tablet (12.5 mg total) by mouth 3 (three) times daily as needed for dizziness., Disp: 30 tablet, Rfl: 2 .  metoprolol tartrate (LOPRESSOR) 50 MG tablet, Take 1 tablet (50 mg total) by mouth 2 (two) times daily., Disp: 180 tablet, Rfl: 3 .  nitroGLYCERIN (NITROSTAT) 0.4 MG SL tablet, Place 1 tablet (0.4 mg total) under the tongue every 5 (five) minutes as needed for chest pain., Disp: 90 tablet, Rfl: 3 .  omeprazole (PRILOSEC) 20 MG capsule, Take 20 mg by mouth daily as needed (uses OTC for heartburn)., Disp: , Rfl:  .  polyethylene glycol (MIRALAX / GLYCOLAX) 17 g packet, Take 17 g by mouth daily., Disp: 14 each, Rfl: 0 .  Turmeric Curcumin 500 MG CAPS, Take 1,000 mg by mouth daily., Disp: , Rfl:   Past Medical History: Past Medical History:  Diagnosis Date  . ABNORMAL  TRANSAMINASE-LFT'S 08/04/2008   Qualifier: Diagnosis of  By: Fuller Plan MD FACG, [place] RHINITIS 05/06/2007   Qualifier: Diagnosis of  By: Jenny Reichmann MD, [place] Oris   . Allergy   . BPH (benign prostatic hyperplasia) 04/28/2012  . COPD (chronic obstructive pulmonary disease) ([place])   . Degenerative joint disease 04/23/2011  . ERECTILE DYSFUNCTION 05/06/2007   Qualifier: Diagnosis of  By: Jenny Reichmann MD, [place] Oris   . GERD 05/06/2007   Qualifier: Diagnosis of  By: Jenny Reichmann MD, [place] Oris   . HYPERLIPIDEMIA 05/06/2007   Qualifier: Diagnosis of  By: Jenny Reichmann MD, [place] Oris   . HYPOTHYROIDISM 05/06/2007   Qualifier: Diagnosis of  By: Jenny Reichmann MD, [place] Oris   . Insomnia 04/28/2012  . OSTEOARTHRITIS, KNEE, 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   Qualifier: Diagnosis of  By: Jenny Reichmann MD, [place] Oris   . Personal history of colonic polyps 07/20/2008   Centricity Description: PERSONAL HX COLONIC POLYPS Qualifier: Diagnosis of  By: Ardis Hughs MD, Melene Plan  Centricity Description: COLONIC POLYPS, HX OF Qualifier: Diagnosis of  By: Jenny Reichmann MD, [place] Oris   . RLS (restless legs syndrome) 10/17/2010  . Urge incontinence 04/28/2012   [facility] heps per urology    Tobacco Use: Social History   Tobacco Use  Smoking Status Former Smoker  . Packs/day: 2.00  . Years: 37.00  . Pack years: 74.00  . Quit date: 06/11/2000  . Years since quitting: 20.1  Smokeless Tobacco Never Used  Tobacco Comment   quit smoking cigarettes 2002 and quit cigars 2016    Labs: Recent Review Flowsheet Data    Labs for ITP Cardiac and Pulmonary Rehab Latest Ref Rng & Units 04/30/2020 04/30/2020 05/01/2020 05/01/2020 05/01/2020   Cholestrol 0 - 200 mg/dL - - - - -   LDLCALC 0 - 99 mg/dL - - - - -   LDLDIRECT mg/dL - - - - -   HDL >40 mg/dL - - - - -   Trlycerides <150 mg/dL - - - - -   Hemoglobin A1c 4.8 - 5.6 % - - - - -   PHART 7.350 - 7.450 - - - - -   PCO2ART 32.0 - 48.0 mmHg - - - - -   HCO3 20.0 - 28.0 mmol/L - - - - -   TCO2 22 - 32 mmol/L - - - - -   ACIDBASEDEF 0.0 - 2.0  mmol/L - - - - -   O2SAT % 48.8 60.6 49.7 40.1 46.7      Capillary Blood Glucose: Lab Results  Component Value Date   GLUCAP 84 05/08/2020   GLUCAP 122 (H) 05/02/2020   GLUCAP 117 (H) 05/02/2020   GLUCAP 132 (H) 05/02/2020   GLUCAP 119 (H) 05/02/2020     Exercise Target Goals: Exercise Program Goal: Individual exercise prescription set using results from initial 6 min walk test and THRR while considering  patient's activity barriers and safety.   Exercise Prescription Goal: Starting with aerobic activity 30 plus minutes a day, 3 days per week for initial exercise prescription. Provide home exercise prescription and guidelines that participant acknowledges understanding prior to discharge.  Activity Barriers & Risk Stratification:  Activity Barriers & Cardiac Risk Stratification - 07/06/20 1258      Activity Barriers & Cardiac Risk Stratification   Activity Barriers Arthritis;Right Knee Replacement;Joint Problems;Shortness of Breath;Balance Concerns;History of Falls    Cardiac Risk Stratification High           6 Minute Walk:  6 Minute Walk    Row Name 07/06/20 1421         6 Minute Walk   Phase Initial     Distance 1300 feet     Walk Time 6 minutes     # of Rest Breaks 0     MPH 2.5     METS 2.99     RPE 12     VO2 Peak 10.49     Symptoms Yes (comment)     Comments SOB     Resting HR 68 bpm     Resting BP 118/74     Resting Oxygen Saturation  97 %     Exercise Oxygen Saturation  during 6 min walk 97 %     Max Ex. HR 83 bpm     Max Ex. BP 152/76     2 Minute Post BP 128/76            Oxygen Initial Assessment:   Oxygen Re-Evaluation:   Oxygen Discharge (Final Oxygen Re-Evaluation):   Initial Exercise Prescription:  Initial Exercise Prescription - 07/06/20 1400      Date of Initial Exercise RX and Referring Provider   Date 07/06/20    Referring Provider [name]    Expected Discharge Date 09/30/20      Recumbant Elliptical   Level 1     RPM 60    Minutes 39      Intensity  THRR 40-80% of Max Heartrate 61-122    Ratings of Perceived Exertion 11-13    Perceived Dyspnea 0-4      Resistance Training   Training Prescription Yes    Weight 3    Reps 10-15           Perform Capillary Blood Glucose checks as needed.  Exercise Prescription Changes:   Exercise Prescription Changes    Row Name 07/18/20 1000 07/18/20 1033 08/01/20 1000 08/15/20 0900       Response to Exercise   Blood Pressure (Admit) 126/72 -- 112/76 110/76    Blood Pressure (Exercise) 158/82 -- 154/80 140/62    Blood Pressure (Exit) 122/78 -- 108/78 96/64    Heart Rate (Admit) 71 bpm -- 68 bpm 57 bpm    Heart Rate (Exercise) 100 bpm -- 99 bpm 109 bpm    Heart Rate (Exit) 79 bpm -- 75 bpm 64 bpm    Rating of Perceived Exertion (Exercise) 13 -- 12 12    Duration Continue with 30 min of aerobic exercise without signs/symptoms of physical distress. -- Continue with 30 min of aerobic exercise without signs/symptoms of physical distress. Continue with 30 min of aerobic exercise without signs/symptoms of physical distress.    Intensity THRR unchanged -- THRR unchanged THRR unchanged         Progression   Progression Continue to progress workloads to maintain intensity without signs/symptoms of physical distress. -- Continue to progress workloads to maintain intensity without signs/symptoms of physical distress. Continue to progress workloads to maintain intensity without signs/symptoms of physical distress.         Resistance Training   Training Prescription Yes -- Yes Yes    Weight 5 lbs -- 5 lbs 5 lbs    Reps 10-15 -- 10-15 10-15    Time 10 Minutes -- 10 Minutes 10 Minutes         Arm Ergometer   Level 2 -- 2.9 2.5    Minutes 22 -- 22 22    METs 3.4 -- 3.5 5.1         Recumbant Elliptical   Level 1 -- 2 2    RPM 70 -- 75 75    Minutes 17 -- 17 17    METs 4.7 -- 5.4 60         Home Exercise Plan   Plans to continue exercise at -- Home  (comment) -- --    Frequency -- Add 2 additional days to program exercise sessions. -- --    Initial Home Exercises Provided -- 07/18/20 -- --           Exercise Comments:   Exercise Goals and Review:   Exercise Goals    Row Name 07/06/20 1424 07/18/20 1031 08/15/20 0910         Exercise Goals   Increase Physical Activity Yes Yes Yes     Intervention Provide advice, education, support and counseling about physical activity/exercise needs.;Develop an individualized exercise prescription for aerobic and resistive training based on initial evaluation findings, risk stratification, comorbidities and participant's personal goals. Provide advice, education, support and counseling about physical activity/exercise needs.;Develop an individualized exercise prescription for aerobic and resistive training based on initial evaluation findings, risk stratification, comorbidities and participant's personal goals. Provide advice, education, support and counseling about physical activity/exercise needs.;Develop an individualized exercise prescription for aerobic and resistive training based on initial evaluation findings, risk stratification, comorbidities and participant's personal goals.     Expected Outcomes Short Term:  Attend rehab on a regular basis to increase amount of physical activity.;Long Term: Add in home exercise to make exercise part of routine and to increase amount of physical activity.;Long Term: Exercising regularly at least 3-5 days a week. Short Term: Attend rehab on a regular basis to increase amount of physical activity.;Long Term: Add in home exercise to make exercise part of routine and to increase amount of physical activity.;Long Term: Exercising regularly at least 3-5 days a week. Short Term: Attend rehab on a regular basis to increase amount of physical activity.;Long Term: Add in home exercise to make exercise part of routine and to increase amount of physical activity.;Long Term:  Exercising regularly at least 3-5 days a week.     Increase Strength and Stamina Yes Yes Yes     Intervention Provide advice, education, support and counseling about physical activity/exercise needs.;Develop an individualized exercise prescription for aerobic and resistive training based on initial evaluation findings, risk stratification, comorbidities and participant's personal goals. Provide advice, education, support and counseling about physical activity/exercise needs.;Develop an individualized exercise prescription for aerobic and resistive training based on initial evaluation findings, risk stratification, comorbidities and participant's personal goals. Provide advice, education, support and counseling about physical activity/exercise needs.;Develop an individualized exercise prescription for aerobic and resistive training based on initial evaluation findings, risk stratification, comorbidities and participant's personal goals.     Expected Outcomes Short Term: Increase workloads from initial exercise prescription for resistance, speed, and METs.;Short Term: Perform resistance training exercises routinely during rehab and add in resistance training at home;Long Term: Improve cardiorespiratory fitness, muscular endurance and strength as measured by increased METs and functional capacity (6MWT) Short Term: Increase workloads from initial exercise prescription for resistance, speed, and METs.;Short Term: Perform resistance training exercises routinely during rehab and add in resistance training at home;Long Term: Improve cardiorespiratory fitness, muscular endurance and strength as measured by increased METs and functional capacity (6MWT) Short Term: Increase workloads from initial exercise prescription for resistance, speed, and METs.;Short Term: Perform resistance training exercises routinely during rehab and add in resistance training at home;Long Term: Improve cardiorespiratory fitness, muscular  endurance and strength as measured by increased METs and functional capacity (6MWT)     Able to understand and use rate of perceived exertion (RPE) scale Yes Yes Yes     Intervention Provide education and explanation on how to use RPE scale Provide education and explanation on how to use RPE scale Provide education and explanation on how to use RPE scale     Expected Outcomes Short Term: Able to use RPE daily in rehab to express subjective intensity level;Long Term:  Able to use RPE to guide intensity level when exercising independently Short Term: Able to use RPE daily in rehab to express subjective intensity level;Long Term:  Able to use RPE to guide intensity level when exercising independently Short Term: Able to use RPE daily in rehab to express subjective intensity level;Long Term:  Able to use RPE to guide intensity level when exercising independently     Knowledge and understanding of Target Heart Rate Range (THRR) Yes Yes Yes     Intervention Provide education and explanation of THRR including how the numbers were predicted and where they are located for reference Provide education and explanation of THRR including how the numbers were predicted and where they are located for reference Provide education and explanation of THRR including how the numbers were predicted and where they are located for reference     Expected Outcomes Short Term: Able  to state/look up THRR;Long Term: Able to use THRR to govern intensity when exercising independently;Short Term: Able to use daily as guideline for intensity in rehab Short Term: Able to state/look up THRR;Long Term: Able to use THRR to govern intensity when exercising independently;Short Term: Able to use daily as guideline for intensity in rehab Short Term: Able to state/look up THRR;Long Term: Able to use THRR to govern intensity when exercising independently;Short Term: Able to use daily as guideline for intensity in rehab     Able to check pulse  independently Yes Yes Yes     Intervention Provide education and demonstration on how to check pulse in carotid and radial arteries.;Review the importance of being able to check your own pulse for safety during independent exercise Provide education and demonstration on how to check pulse in carotid and radial arteries.;Review the importance of being able to check your own pulse for safety during independent exercise Provide education and demonstration on how to check pulse in carotid and radial arteries.;Review the importance of being able to check your own pulse for safety during independent exercise     Expected Outcomes Short Term: Able to explain why pulse checking is important during independent exercise;Long Term: Able to check pulse independently and accurately Short Term: Able to explain why pulse checking is important during independent exercise;Long Term: Able to check pulse independently and accurately Short Term: Able to explain why pulse checking is important during independent exercise;Long Term: Able to check pulse independently and accurately     Understanding of Exercise Prescription Yes Yes Yes     Intervention Provide education, explanation, and written materials on patient's individual exercise prescription Provide education, explanation, and written materials on patient's individual exercise prescription Provide education, explanation, and written materials on patient's individual exercise prescription     Expected Outcomes Short Term: Able to explain program exercise prescription;Long Term: Able to explain home exercise prescription to exercise independently Short Term: Able to explain program exercise prescription;Long Term: Able to explain home exercise prescription to exercise independently Short Term: Able to explain program exercise prescription;Long Term: Able to explain home exercise prescription to exercise independently            Exercise Goals Re-Evaluation :  Exercise  Goals Re-Evaluation    [place] Name 07/18/20 1032 08/15/20 0911           Exercise Goal Re-Evaluation   Exercise Goals Review Increase Physical Activity;Increase Strength and Stamina;Able to understand and use rate of perceived exertion (RPE) scale;Knowledge and understanding of Target Heart Rate Range (THRR);Able to check pulse independently;Understanding of Exercise Prescription Increase Physical Activity;Increase Strength and Stamina;Able to understand and use rate of perceived exertion (RPE) scale;Knowledge and understanding of Target Heart Rate Range (THRR);Able to check pulse independently;Understanding of Exercise Prescription      Comments Patient  has completed 4 exercise sessions. He tolerating exercise well, but complains of a little bit of knee pain. He says he is going to the doctor to see about a knee replacement. He had to switch to the arm ergometer for the second station because of his knee pain. He says this feels much better. He is progressing well and continues to ask to increase his intensities. He is enthusiastic about coming to rehab and is very pleasant to be around. He is currently exercising at 4.7 METs on the Elliptical. Will contine to progress as able. Patient has completed 16 exercise sessions. He has tolerated exercise well and is progressing very well. He still complains  of knee pain but it is not as much. He says his knee gets sore and stiff after exercise. He says that exercising is helping him so much at home. He feels he is getting stronger and is able to do much more at home. He is still exercising at home and is progressing his intensities at home. He is currently exercising at 6.0 METs on the Elliptical. Will continue to monitor and progress as able.      Expected Outcomes Through exercise at rehab and with a home exercise program, patient will reach their goals. Through exercise at rehab and with a home exercise program, patient will reach their goals.               Discharge Exercise Prescription (Final Exercise Prescription Changes):  Exercise Prescription Changes - 08/15/20 0900      Response to Exercise   Blood Pressure (Admit) 110/76    Blood Pressure (Exercise) 140/62    Blood Pressure (Exit) 96/64    Heart Rate (Admit) 57 bpm    Heart Rate (Exercise) 109 bpm    Heart Rate (Exit) 64 bpm    Rating of Perceived Exertion (Exercise) 12    Duration Continue with 30 min of aerobic exercise without signs/symptoms of physical distress.    Intensity THRR unchanged      Progression   Progression Continue to progress workloads to maintain intensity without signs/symptoms of physical distress.      Resistance Training   Training Prescription Yes    Weight 5 lbs    Reps 10-15    Time 10 Minutes      Arm Ergometer   Level 2.5    Minutes 22    METs 5.1      Recumbant Elliptical   Level 2    RPM 75    Minutes 17    METs 60           Nutrition:  Target Goals: Understanding of nutrition guidelines, daily intake of sodium <1575m, cholesterol <2021m calories 30% from fat and 7% or less from saturated fats, daily to have 5 or more servings of fruits and vegetables.  Biometrics:  Pre Biometrics - 08/15/20 0910      Pre Biometrics   Weight 80.6 kg    BMI (Calculated) 27.02            Nutrition Therapy Plan and Nutrition Goals:  Nutrition Therapy & Goals - 08/10/20 0944      Personal Nutrition Goals   Comments We will continue to provide heart heathy nutritional education through handouts.      Intervention Plan   Intervention Nutrition handout(s) given to patient.           Nutrition Assessments:  Nutrition Assessments - 07/06/20 1313      MEDFICTS Scores   Pre Score 61          MEDIFICTS Score Key:  ?70 Need to make dietary changes   40-70 Heart Healthy Diet  ? 40 Therapeutic Level Cholesterol Diet   Picture Your Plate Scores:  <4<83nhealthy dietary pattern with much room for improvement.  41-50  Dietary pattern unlikely to meet recommendations for good health and room for improvement.  51-60 More healthful dietary pattern, with some room for improvement.   >60 Healthy dietary pattern, although there may be some specific behaviors that could be improved.    Nutrition Goals Re-Evaluation:   Nutrition Goals Discharge (Final Nutrition Goals Re-Evaluation):   Psychosocial: Target Goals: Acknowledge presence  or absence of significant depression and/or stress, maximize coping skills, provide positive support system. Participant is able to verbalize types and ability to use techniques and skills needed for reducing stress and depression.  Initial Review & Psychosocial Screening:  Initial Psych Review & Screening - 07/06/20 1259      Initial Review   Current issues with Current Stress Concerns    Source of Stress Concerns Chronic Illness    Comments His INR levels and the fact that he is taking coumadin stresses him out. He does not want to take it, but has been compliant since his doctor said that he needs to take it.      Family Dynamics   Good Support System? Yes    Comments His wife and children support him. He has grand children support him as well. He often talks to his pastor about his problems as well. He has been reading the Bible a lot recently and feels like his stronger connection with the Reita Cliche has helped him cope better recently.      Barriers   Psychosocial barriers to participate in program The patient should benefit from training in stress management and relaxation.      Screening Interventions   Interventions Encouraged to exercise    Expected Outcomes Long Term goal: The participant improves quality of Life and PHQ9 Scores as seen by post scores and/or verbalization of changes;Short Term goal: Identification and review with participant of any Quality of Life or Depression concerns found by scoring the questionnaire.           Quality of Life Scores:  Quality  of Life - 07/06/20 1425      Quality of Life   Select Quality of Life      Quality of Life Scores   Health/Function Pre 16 %    Socioeconomic Pre 19 %    Psych/Spiritual Pre 23.07 %    Family Pre 15 %    GLOBAL Pre 17.89 %          Scores of 19 and below usually indicate a poorer quality of life in these areas.  A difference of  2-3 points is a clinically meaningful difference.  A difference of 2-3 points in the total score of the Quality of Life Index has been associated with significant improvement in overall quality of life, self-image, physical symptoms, and general health in studies assessing change in quality of life.  PHQ-9: Recent Review Flowsheet Data    Depression screen [facility] 2/9 07/06/2020 02/22/2020 02/18/2020 02/12/2019 08/14/2018   Decreased Interest 1  0 0 0 0   Down, Depressed, Hopeless 0 0 0 0 0   PHQ - 2 Score 1 0 0 0 0   Altered sleeping 2 - - - -   Tired, decreased energy 2 - - - -   Change in appetite 0 - - - -   Feeling bad or failure about yourself  0 - - - -   Trouble concentrating 0 - - - -   Moving slowly or fidgety/restless 0 - - - -   Suicidal thoughts 0 - - - -   PHQ-9 Score 5 - - - -   Difficult doing work/chores Somewhat difficult - - - -     Interpretation of Total Score  Total Score Depression Severity:  1-4 = Minimal depression, 5-9 = Mild depression, 10-14 = Moderate depression, 15-19 = Moderately severe depression, 20-27 = Severe depression   Psychosocial Evaluation and Intervention:  Psychosocial Evaluation - 07/06/20 1435      Psychosocial Evaluation & Interventions   Interventions Stress management education;Encouraged to exercise with the program and follow exercise prescription    Comments Patient is slightly stressed due to his toleration of Coumadin and his increased breathing. He is hoping that his doctor removes this from his medication list, as his INR keeps improving. He has no other identifiable psychosocial issues.    Expected  Outcomes Patient will continue to not have any psychosocial issues.    Continue Psychosocial Services  No Follow up required           Psychosocial Re-Evaluation:  Psychosocial Re-Evaluation    [place] Name 07/13/20 [id number] 08/10/20 0944           Psychosocial Re-Evaluation   Current issues with Current Stress Concerns --      Comments Patient is new to the program completing 2 sessions. His current stress concerns are related to his health and taking coumadin. He is managing his stress and it is improving. He demonstrates a positive attitude regading his future. We will continue to monitor. Patient has completed 14 sessions. His cardiologist discontinued his coumadin 2/21 and patient is very happy he does no have to take this medication. He has no psychosocial issues identified. He demonstrates a positive attitude regading his future. We will continue to monitor.      Expected Outcomes Patient will have no additional psychosocial issues identified and his stress will be managed or improved. Patient will have no psychosocial issues identified at discharge.      Interventions Stress management education;Encouraged to attend Cardiac Rehabilitation for the exercise;Relaxation education Stress management education;Encouraged to attend Cardiac Rehabilitation for the exercise;Relaxation education      Continue Psychosocial Services  No Follow up required No Follow up required             Initial Review   Source of Stress Concerns None Identified --             Psychosocial Discharge (Final Psychosocial Re-Evaluation):  Psychosocial Re-Evaluation - 08/10/20 0944      Psychosocial Re-Evaluation   Comments Patient has completed 14 sessions. His cardiologist discontinued his coumadin 2/21 and patient is very happy he does no have to take this medication. He has no psychosocial issues identified. He demonstrates a positive attitude regading his future. We will continue to monitor.    Expected  Outcomes Patient will have no psychosocial issues identified at discharge.    Interventions Stress management education;Encouraged to attend Cardiac Rehabilitation for the exercise;Relaxation education    Continue Psychosocial Services  No Follow up required           Vocational Rehabilitation: Provide vocational rehab assistance to qualifying candidates.   Vocational Rehab Evaluation & Intervention:  Vocational Rehab - 07/06/20 1310      Initial Vocational Rehab Evaluation & Intervention   Assessment shows need for Vocational Rehabilitation No      Vocational Rehab Re-Evaulation   Comments He is retired and has no interest in returning to work.           Education: Education Goals: Education classes will be provided on a weekly basis, covering required topics. Participant will state understanding/return demonstration of topics presented.  Learning Barriers/Preferences:  Learning Barriers/Preferences - 07/06/20 1303      Learning Barriers/Preferences   Learning Barriers Hearing    Learning Preferences Skilled Demonstration           Education Topics: Hypertension, Hypertension  Reduction -Define heart disease and high blood pressure. Discus how high blood pressure affects the body and ways to reduce high blood pressure.   Exercise and Your Heart -Discuss why it is important to exercise, the FITT principles of exercise, normal and abnormal responses to exercise, and how to exercise safely.   Angina -Discuss definition of angina, causes of angina, treatment of angina, and how to decrease risk of having angina.   Cardiac Medications -Review what the following cardiac medications are used for, how they affect the body, and side effects that may occur when taking the medications.  Medications include Aspirin, Beta blockers, calcium channel blockers, ACE Inhibitors, angiotensin receptor blockers, diuretics, digoxin, and antihyperlipidemics.   Congestive Heart  Failure -Discuss the definition of CHF, how to live with CHF, the signs and symptoms of CHF, and how keep track of weight and sodium intake. Flowsheet Row CARDIAC REHAB PHASE II EXERCISE from 08/03/2020 in [place]  Date 07/13/20  Educator [facility]  Instruction Review Code 2- Demonstrated Understanding      Heart Disease and Intimacy -Discus the effect sexual activity has on the heart, how changes occur during intimacy as we age, and safety during sexual activity. Flowsheet Row CARDIAC REHAB PHASE II EXERCISE from 08/03/2020 in [place]  Date 07/20/20  Educator DF  Instruction Review Code 2- Demonstrated Understanding      Smoking Cessation / COPD -Discuss different methods to quit smoking, the health benefits of quitting smoking, and the definition of COPD. Flowsheet Row CARDIAC REHAB PHASE II EXERCISE from 08/03/2020 in [place]  Date 07/27/20  Educator DF  Instruction Review Code 2- Demonstrated Understanding      Nutrition I: Fats -Discuss the types of cholesterol, what cholesterol does to the heart, and how cholesterol levels can be controlled. Flowsheet Row CARDIAC REHAB PHASE II EXERCISE from 08/03/2020 in [place]  Date 08/03/20  Educator [facility]  Instruction Review Code 2- Demonstrated Understanding      Nutrition II: Labels -Discuss the different components of food labels and how to read food label   Heart Parts/Heart Disease and PAD -Discuss the anatomy of the heart, the pathway of blood circulation through the heart, and these are affected by heart disease.   Stress I: Signs and Symptoms -Discuss the causes of stress, how stress may lead to anxiety and depression, and ways to limit stress.   Stress II: Relaxation -Discuss different types of relaxation techniques to limit stress.   Warning Signs of Stroke / TIA -Discuss definition of a stroke, what the signs and  symptoms are of a stroke, and how to identify when someone is having stroke.   Knowledge Questionnaire Score:  Knowledge Questionnaire Score - 07/06/20 1310      Knowledge Questionnaire Score   Pre Score 21/24           Core Components/Risk Factors/Patient Goals at Admission:  Personal Goals and Risk Factors at Admission - 07/06/20 1304      Core Components/Risk Factors/Patient Goals on Admission    Weight Management Yes;Weight Maintenance    Intervention Weight Management: Develop a combined nutrition and exercise program designed to reach desired caloric intake, while maintaining appropriate intake of nutrient and fiber, sodium and fats, and appropriate energy expenditure required for the weight goal.;Weight Management: Provide education and appropriate resources to help participant work on and attain dietary goals.;Weight Management/Obesity: Establish reasonable short term and long term weight goals.;Obesity: Provide education and appropriate resources  to help participant work on and attain dietary goals.    Expected Outcomes Short Term: Continue to assess and modify interventions until short term weight is achieved;Long Term: Adherence to nutrition and physical activity/exercise program aimed toward attainment of established weight goal;Weight Maintenance: Understanding of the daily nutrition guidelines, which includes 25-35% calories from fat, 7% or less cal from saturated fats, less than 271m cholesterol, less than 1.5gm of sodium, & 5 or more servings of fruits and vegetables daily;Weight Loss: Understanding of general recommendations for a balanced deficit meal plan, which promotes 1-2 lb weight loss per week and includes a negative energy balance of [phone] kcal/d;Understanding recommendations for meals to include 15-35% energy as protein, 25-35% energy from fat, 35-60% energy from carbohydrates, less than 2013mof dietary cholesterol, 20-35 gm of total fiber daily;Understanding of  distribution of calorie intake throughout the day with the consumption of 4-5 meals/snacks;Weight Gain: Understanding of general recommendations for a high calorie, high protein meal plan that promotes weight gain by distributing calorie intake throughout the day with the consumption for 4-5 meals, snacks, and/or supplements    Improve shortness of breath with ADL's Yes    Intervention Provide education, individualized exercise plan and daily activity instruction to help decrease symptoms of SOB with activities of daily living.    Expected Outcomes Short Term: Improve cardiorespiratory fitness to achieve a reduction of symptoms when performing ADLs;Long Term: Be able to perform more ADLs without symptoms or delay the onset of symptoms    Lipids Yes    Intervention Provide education and support for participant on nutrition & aerobic/resistive exercise along with prescribed medications to achieve LDL <7050mHDL >64m70m  Expected Outcomes Short Term: Participant states understanding of desired cholesterol values and is compliant with medications prescribed. Participant is following exercise prescription and nutrition guidelines.;Long Term: Cholesterol controlled with medications as prescribed, with individualized exercise RX and with personalized nutrition plan. Value goals: LDL < 70mg22mL > 40 mg.    Stress Yes    Intervention Offer individual and/or small group education and counseling on adjustment to heart disease, stress management and health-related lifestyle change. Teach and support self-help strategies.;Refer participants experiencing significant psychosocial distress to appropriate mental health specialists for further evaluation and treatment. When possible, include family members and significant others in education/counseling sessions.    Expected Outcomes Short Term: Participant demonstrates changes in health-related behavior, relaxation and other stress management skills, ability to obtain  effective social support, and compliance with psychotropic medications if prescribed.;Long Term: Emotional wellbeing is indicated by absence of clinically significant psychosocial distress or social isolation.    Personal Goal Other Yes    Personal Goal He would like to play golf and fish again.    Intervention Attend cardiac rehab and continue with his home exercise program.    Expected Outcomes He will reach a MET level through exercise that allows him to golf again.           Core Components/Risk Factors/Patient Goals Review:   Goals and Risk Factor Review    Row Name 07/13/20 0840 08/10/20 0946           Core Components/Risk Factors/Patient Goals Review   Personal Goals Review Other Other      Review Patient is new to the program completing 2 sessions. 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 He has some risk factors for CAD and is participating in the program for risk modication. His personal goal for the program are to  play golf again and fish again. We will continue to monitor as he works toward [employer] these goals. Patient has completed 14 sessions gaining 1 lb since his last 30 day review. He is doing well in the program with progressions and consistent attendance. He saw his cardiologist 2/21 for routine check. Since patient in NSR, [name] discontinued Cardiazem and coumadin and added ASA 81 mg daily and increased his metoprolol to 50 mg bid. Patient says he is feeling stronger and feels the program is helping him. His personal goals for the program are to play golf and fish again. We will continue to monitor as he works toward meeting his goals.      Expected Outcomes Patient will complete the program meeting both personal and program goals. Patient will complete the program meeting both personal and program goals.             Core Components/Risk Factors/Patient Goals at Discharge (Final Review):   Goals and Risk Factor Review - 08/10/20 0946      Core  Components/Risk Factors/Patient Goals Review   Personal Goals Review Other    Review Patient has completed 14 sessions gaining 1 lb since his last 30 day review. He is doing well in the program with progressions and consistent attendance. He saw his cardiologist 2/21 for routine check. Since patient in NSR, [name] discontinued Cardiazem and coumadin and added ASA 81 mg daily and increased his metoprolol to 50 mg bid. Patient says he is feeling stronger and feels the program is helping him. His personal goals for the program are to play golf and fish again. We will continue to monitor as he works toward meeting his goals.    Expected Outcomes Patient will complete the program meeting both personal and program goals.           ITP Comments:   Comments: ITP REVIEW Pt is making expected progress toward Cardiac Rehab goals after completing 17 sessions. Recommend continued exercise, life style modification, education, and increased stamina and strength.

## 2020-08-17 NOTE — Progress Notes (Signed)
Daily Session Note  Patient Details  Name: Guy Morris MRN: [id number] Date of Birth: [birth year] Referring Provider:   Flowsheet Row CARDIAC REHAB PHASE II ORIENTATION from 07/06/2020 in [place]  Referring Provider [name]      Encounter Date: 08/17/2020  Check In:  Session Check In - 08/17/20 0815      Check-In   Supervising physician immediately available to respond to emergencies [facility] MD immediately available    Physician(s) Dr. Domenic Polite    Location [facility]    Staff Present Geanie Cooley, RN;Madison Audria Nine, MS, Exercise Physiologist    Virtual Visit No    Medication changes reported     No    Fall or balance concerns reported    No    Tobacco Cessation No Change    Warm-up and Cool-down Performed as group-led instruction    Resistance Training Performed Yes    VAD Patient? No    PAD/SET Patient? No      Pain Assessment   Currently in Pain? No/denies    Multiple Pain Sites No           Capillary Blood Glucose: No results found for this or any previous visit (from the past 24 hour(s)).    Social History   Tobacco Use  Smoking Status Former Smoker  . Packs/day: 2.00  . Years: 37.00  . Pack years: 74.00  . Quit date: 06/11/2000  . Years since quitting: 20.1  Smokeless Tobacco Never Used  Tobacco Comment   quit smoking cigarettes 2002 and quit cigars 2016    Goals Met:  Independence with exercise equipment Exercise tolerated well No report of cardiac concerns or symptoms Strength training completed today  Goals Unmet:  Not Applicable  Comments: check out @ 8:15am   Dr. Kathie Dike is Medical Director for [facility] Pulmonary Rehab.

## 2020-08-18 ENCOUNTER — Ambulatory Visit: Payer: Medicare Other | Admitting: Internal Medicine

## 2020-08-19 ENCOUNTER — Other Ambulatory Visit: Payer: Self-pay

## 2020-08-19 ENCOUNTER — Encounter (HOSPITAL_COMMUNITY)
Admission: RE | Admit: 2020-08-19 | Discharge: 2020-08-19 | Disposition: A | Discharge status: Home / Self Care | Payer: Medicare Other | Source: Ambulatory Visit | Attending: Cardiology | Admitting: Cardiology

## 2020-08-19 DIAGNOSIS — Z951 Presence of aortocoronary bypass graft: Secondary | ICD-10-CM

## 2020-08-19 NOTE — Progress Notes (Signed)
Daily Session Note  Patient Details  Name: MELVILLE ENGEN MRN: [id number] Date of Birth: 02-Nov-1950 Referring Provider:   Flowsheet Row CARDIAC REHAB PHASE II ORIENTATION from 07/06/2020 in [place]  Referring Provider [name]      Encounter Date: 08/19/2020  Check In:  Session Check In - 08/19/20 0815      Check-In   Supervising physician immediately available to respond to emergencies [facility] MD immediately available    Physician(s) Dr. Johnsie Cancel    Location [facility]    Staff Present Aundra Dubin, RN, Bjorn Loser, MS, ACSM-CEP, Exercise Physiologist    Virtual Visit No    Medication changes reported     No    Fall or balance concerns reported    No    Tobacco Cessation No Change    Warm-up and Cool-down Performed as group-led instruction    Resistance Training Performed Yes    VAD Patient? No    PAD/SET Patient? No      Pain Assessment   Currently in Pain? No/denies    Multiple Pain Sites No           Capillary Blood Glucose: No results found for this or any previous visit (from the past 24 hour(s)).    Social History   Tobacco Use  Smoking Status Former Smoker  . Packs/day: 2.00  . Years: 37.00  . Pack years: 74.00  . Quit date: 06/11/2000  . Years since quitting: 20.2  Smokeless Tobacco Never Used  Tobacco Comment   quit smoking cigarettes 2002 and quit cigars 2016    Goals Met:  Independence with exercise equipment Exercise tolerated well No report of cardiac concerns or symptoms Strength training completed today  Goals Unmet:  Not Applicable  Comments: checkout time is 0915   Dr. Kathie Dike is Medical Director for [facility] Pulmonary Rehab.

## 2020-08-22 ENCOUNTER — Other Ambulatory Visit: Payer: Self-pay

## 2020-08-22 ENCOUNTER — Encounter (HOSPITAL_COMMUNITY)
Admission: RE | Admit: 2020-08-22 | Discharge: 2020-08-22 | Disposition: A | Discharge status: Home / Self Care | Payer: Medicare Other | Source: Ambulatory Visit | Attending: Cardiology | Admitting: Cardiology

## 2020-08-22 DIAGNOSIS — Z951 Presence of aortocoronary bypass graft: Secondary | ICD-10-CM

## 2020-08-22 NOTE — Progress Notes (Signed)
Daily Session Note  Patient Details  Name: Guy Morris MRN: [id number] Date of Birth: 10-Nov-1950 Referring Provider:   Flowsheet Row CARDIAC REHAB PHASE II ORIENTATION from 07/06/2020 in [place]  Referring Provider [name]      Encounter Date: 08/22/2020  Check In:  Session Check In - 08/22/20 0815      Check-In   Supervising physician immediately available to respond to emergencies [facility] MD immediately available    Physician(s) Dr. Harrington Challenger    Location [facility]    Staff Present Cathren Harsh, MS, Exercise Physiologist;Dalton Kris Mouton, MS, ACSM-CEP, Exercise Physiologist    Virtual Visit No    Medication changes reported     No    Fall or balance concerns reported    No    Tobacco Cessation No Change    Warm-up and Cool-down Performed as group-led instruction    Resistance Training Performed Yes    VAD Patient? No    PAD/SET Patient? No      Pain Assessment   Currently in Pain? No/denies    Multiple Pain Sites No           Capillary Blood Glucose: No results found for this or any previous visit (from the past 24 hour(s)).    Social History   Tobacco Use  Smoking Status Former Smoker  . Packs/day: 2.00  . Years: 37.00  . Pack years: 74.00  . Quit date: 06/11/2000  . Years since quitting: 20.2  Smokeless Tobacco Never Used  Tobacco Comment   quit smoking cigarettes 2002 and quit cigars 2016    Goals Met:  Independence with exercise equipment Exercise tolerated well No report of cardiac concerns or symptoms Strength training completed today  Goals Unmet:  Not Applicable  Comments: check out 0915   Dr. Kathie Dike is Medical Director for [facility] Pulmonary Rehab.

## 2020-08-24 ENCOUNTER — Encounter (HOSPITAL_COMMUNITY)
Admission: RE | Admit: 2020-08-24 | Discharge: 2020-08-24 | Disposition: A | Discharge status: Home / Self Care | Payer: Medicare Other | Source: Ambulatory Visit | Attending: Cardiology | Admitting: Cardiology

## 2020-08-24 ENCOUNTER — Other Ambulatory Visit: Payer: Self-pay

## 2020-08-24 DIAGNOSIS — Z951 Presence of aortocoronary bypass graft: Secondary | ICD-10-CM | POA: Diagnosis not present

## 2020-08-24 NOTE — Progress Notes (Signed)
Daily Session Note  Patient Details  Name: Guy Morris MRN: [id number] Date of Birth: Aug 17, 1950 Referring Provider:   Flowsheet Row CARDIAC REHAB PHASE II ORIENTATION from 07/06/2020 in [place]  Referring Provider [name]      Encounter Date: 08/24/2020  Check In:  Session Check In - 08/24/20 0815      Check-In   Supervising physician immediately available to respond to emergencies [facility] MD immediately available    Physician(s) Dr. Harrington Challenger    Location [facility]    Staff Present Cathren Harsh, MS, Exercise Physiologist;Dalton Kris Mouton, MS, ACSM-CEP, Exercise Physiologist    Virtual Visit No    Medication changes reported     No    Fall or balance concerns reported    No    Tobacco Cessation No Change    Warm-up and Cool-down Performed as group-led instruction    Resistance Training Performed Yes    VAD Patient? No    PAD/SET Patient? No      Pain Assessment   Currently in Pain? No/denies    Multiple Pain Sites No           Capillary Blood Glucose: No results found for this or any previous visit (from the past 24 hour(s)).    Social History   Tobacco Use  Smoking Status Former Smoker  . Packs/day: 2.00  . Years: 37.00  . Pack years: 74.00  . Quit date: 06/11/2000  . Years since quitting: 20.2  Smokeless Tobacco Never Used  Tobacco Comment   quit smoking cigarettes 2002 and quit cigars 2016    Goals Met:  Independence with exercise equipment Exercise tolerated well No report of cardiac concerns or symptoms Strength training completed today  Goals Unmet:  Not Applicable  Comments: checkout 0915   Dr. Kathie Dike is Medical Director for [facility] Pulmonary Rehab.

## 2020-08-25 ENCOUNTER — Other Ambulatory Visit: Payer: Self-pay

## 2020-08-25 ENCOUNTER — Encounter: Payer: Self-pay | Admitting: Internal Medicine

## 2020-08-25 ENCOUNTER — Ambulatory Visit (INDEPENDENT_AMBULATORY_CARE_PROVIDER_SITE_OTHER): Payer: Medicare Other | Admitting: Internal Medicine

## 2020-08-25 ENCOUNTER — Other Ambulatory Visit: Payer: Self-pay | Admitting: Internal Medicine

## 2020-08-25 VITALS — BP 138/68 | HR 57 | Temp 98.0°F | Ht 68.0 in | Wt 178.0 lb

## 2020-08-25 DIAGNOSIS — I6529 Occlusion and stenosis of unspecified carotid artery: Secondary | ICD-10-CM | POA: Diagnosis not present

## 2020-08-25 DIAGNOSIS — E7800 Pure hypercholesterolemia, unspecified: Secondary | ICD-10-CM | POA: Diagnosis not present

## 2020-08-25 DIAGNOSIS — I700 Atherosclerosis of aorta: Secondary | ICD-10-CM | POA: Insufficient documentation

## 2020-08-25 DIAGNOSIS — Z789 Other specified health status: Secondary | ICD-10-CM | POA: Diagnosis not present

## 2020-08-25 DIAGNOSIS — E538 Deficiency of other specified B group vitamins: Secondary | ICD-10-CM

## 2020-08-25 DIAGNOSIS — R030 Elevated blood-pressure reading, without diagnosis of hypertension: Secondary | ICD-10-CM

## 2020-08-25 DIAGNOSIS — E039 Hypothyroidism, unspecified: Secondary | ICD-10-CM | POA: Diagnosis not present

## 2020-08-25 DIAGNOSIS — E559 Vitamin D deficiency, unspecified: Secondary | ICD-10-CM

## 2020-08-25 DIAGNOSIS — N320 Bladder-neck obstruction: Secondary | ICD-10-CM

## 2020-08-25 DIAGNOSIS — R739 Hyperglycemia, unspecified: Secondary | ICD-10-CM

## 2020-08-25 LAB — BASIC METABOLIC PANEL
BUN: 9 mg/dL (ref 6–23)
CO2: 26 mEq/L (ref 19–32)
Calcium: 9.7 mg/dL (ref 8.4–10.5)
Chloride: 102 mEq/L (ref 96–112)
Creatinine, Ser: 0.76 mg/dL (ref 0.40–1.50)
GFR: 92.1 mL/min (ref >60.00)
Glucose, Bld: 92 mg/dL (ref 70–99)
Potassium: 4.9 mEq/L (ref 3.5–5.1)
Sodium: 137 mEq/L (ref 135–145)

## 2020-08-25 LAB — URINALYSIS, ROUTINE W REFLEX MICROSCOPIC
Bilirubin Urine: NEGATIVE
Hgb urine dipstick: NEGATIVE
Ketones, ur: NEGATIVE
Nitrite: NEGATIVE
RBC / HPF: NONE SEEN (ref 0–?)
Specific Gravity, Urine: <=1.005 — AB (ref 1.000–1.030)
Total Protein, Urine: NEGATIVE
Urine Glucose: NEGATIVE
Urobilinogen, UA: 0.2 (ref 0.0–1.0)
pH: 5.5 (ref 5.0–8.0)

## 2020-08-25 LAB — LIPID PANEL
Cholesterol: 220 mg/dL — ABNORMAL HIGH (ref 0–200)
HDL: 45.3 mg/dL (ref >39.00)
NonHDL: 174.38
Total CHOL/HDL Ratio: 5
Triglycerides: 218 mg/dL — ABNORMAL HIGH (ref 0.0–149.0)
VLDL: 43.6 mg/dL — ABNORMAL HIGH (ref 0.0–40.0)

## 2020-08-25 LAB — HEPATIC FUNCTION PANEL
ALT: 10 U/L (ref 0–53)
AST: 14 U/L (ref 0–37)
Albumin: 4.2 g/dL (ref 3.5–5.2)
Alkaline Phosphatase: 88 U/L (ref 39–117)
Bilirubin, Direct: 0.1 mg/dL (ref 0.0–0.3)
Total Bilirubin: 0.5 mg/dL (ref 0.2–1.2)
Total Protein: 6.8 g/dL (ref 6.0–8.3)

## 2020-08-25 LAB — CBC WITH DIFFERENTIAL/PLATELET
Basophils Absolute: 0 10*3/uL (ref 0.0–0.1)
Basophils Relative: 0.6 % (ref 0.0–3.0)
Eosinophils Absolute: 0.1 10*3/uL (ref 0.0–0.7)
Eosinophils Relative: 1.6 % (ref 0.0–5.0)
HCT: 45.9 % (ref 39.0–52.0)
Hemoglobin: 15.2 g/dL (ref 13.0–17.0)
Lymphocytes Relative: 38 % (ref 12.0–46.0)
Lymphs Abs: 2.3 10*3/uL (ref 0.7–4.0)
MCHC: 33.1 g/dL (ref 30.0–36.0)
MCV: 87.5 fl (ref 78.0–100.0)
Monocytes Absolute: 0.6 10*3/uL (ref 0.1–1.0)
Monocytes Relative: 9.3 % (ref 3.0–12.0)
Neutro Abs: 3.1 10*3/uL (ref 1.4–7.7)
Neutrophils Relative %: 50.5 % (ref 43.0–77.0)
Platelets: 257 10*3/uL (ref 150.0–400.0)
RBC: 5.24 Mil/uL (ref 4.22–5.81)
RDW: 15.6 % — ABNORMAL HIGH (ref 11.5–15.5)
WBC: 6 10*3/uL (ref 4.0–10.5)

## 2020-08-25 LAB — HEMOGLOBIN A1C: Hgb A1c MFr Bld: 6.2 % (ref 4.6–6.5)

## 2020-08-25 LAB — PSA: PSA: 0.87 ng/mL (ref 0.10–4.00)

## 2020-08-25 LAB — LDL CHOLESTEROL, DIRECT: Direct LDL: 148 mg/dL

## 2020-08-25 LAB — VITAMIN B12: Vitamin B-12: 211 pg/mL (ref 211–911)

## 2020-08-25 LAB — VITAMIN D 25 HYDROXY (VIT D DEFICIENCY, FRACTURES): VITD: 28.67 ng/mL — ABNORMAL LOW (ref 30.00–100.00)

## 2020-08-25 LAB — TSH: TSH: 13.9 u[IU]/mL — ABNORMAL HIGH (ref 0.35–4.50)

## 2020-08-25 MED ORDER — LEVOTHYROXINE SODIUM 175 MCG PO TABS: 175.0000 ug | ORAL_TABLET | Freq: Every day | ORAL | 3 refills | Status: DC

## 2020-08-25 MED ORDER — THERA-D 2000 50 MCG (2000 UT) PO TABS: ORAL_TABLET | ORAL | 99 refills | Status: DC
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
## 2020-08-25 NOTE — Patient Instructions (Signed)

## 2020-08-26 ENCOUNTER — Encounter (HOSPITAL_COMMUNITY)
Admission: RE | Admit: 2020-08-26 | Discharge: 2020-08-26 | Disposition: A | Discharge status: Home / Self Care | Payer: Medicare Other | Source: Ambulatory Visit | Attending: Cardiology | Admitting: Cardiology

## 2020-08-26 DIAGNOSIS — Z951 Presence of aortocoronary bypass graft: Secondary | ICD-10-CM

## 2020-08-26 NOTE — Progress Notes (Signed)
Daily Session Note  Patient Details  Name: Guy Morris MRN: [id number] Date of Birth: [birth year] Referring Provider:   Flowsheet Row CARDIAC REHAB PHASE II ORIENTATION from 07/06/2020 in [place]  Referring Provider [name]      Encounter Date: 08/26/2020  Check In:  Session Check In - 08/26/20 0815      Check-In   Supervising physician immediately available to respond to emergencies [facility] MD immediately available    Physician(s) Dr. Domenic Polite    Location [facility]    Staff Present Cathren Harsh, MS, Exercise Physiologist;Dalton Kris Mouton, MS, ACSM-CEP, Exercise Physiologist    Virtual Visit No    Medication changes reported     No    Fall or balance concerns reported    No    Tobacco Cessation No Change    Warm-up and Cool-down Performed as group-led instruction    Resistance Training Performed Yes    VAD Patient? No    PAD/SET Patient? No      Pain Assessment   Currently in Pain? No/denies    Multiple Pain Sites No           Capillary Blood Glucose: Results for orders placed or performed in visit on 08/25/20 (from the past 24 hour(s))  VITAMIN D 25 Hydroxy (Vit-D Deficiency, Fractures)     Status: Abnormal   Collection Time: 08/25/20 10:08 AM  Result Value Ref Range   VITD 28.67 (L) 30.00 - 100.00 ng/mL  Vitamin B12     Status: None   Collection Time: 08/25/20 10:08 AM  Result Value Ref Range   Vitamin B-12 211 211 - 911 pg/mL  Basic metabolic panel     Status: None   Collection Time: 08/25/20 10:08 AM  Result Value Ref Range   Sodium 137 135 - 145 mEq/L   Potassium 4.9 3.5 - 5.1 mEq/L   Chloride 102 96 - 112 mEq/L   CO2 26 19 - 32 mEq/L   Glucose, Bld 92 70 - 99 mg/dL   BUN 9 6 - 23 mg/dL   Creatinine, Ser 0.76 0.40 - 1.50 mg/dL   GFR 92.10 >60.00 mL/min   Calcium 9.7 8.4 - 10.5 mg/dL  PSA     Status: None   Collection Time: 08/25/20 10:08 AM  Result Value Ref Range   PSA 0.87 0.10 - 4.00 ng/mL   Urinalysis, Routine w reflex microscopic     Status: Abnormal   Collection Time: 08/25/20 10:08 AM  Result Value Ref Range   Color, Urine YELLOW Yellow;Lt. Yellow;Straw;Dark Yellow;Amber;Green;Red;Brown   APPearance CLEAR Clear;Turbid;Slightly Cloudy;Cloudy   Specific Gravity, Urine <=1.005 (A) 1.000 - 1.030   pH 5.5 5.0 - 8.0   Total Protein, Urine NEGATIVE Negative   Urine Glucose NEGATIVE Negative   Ketones, ur NEGATIVE Negative   Bilirubin Urine NEGATIVE Negative   Hgb urine dipstick NEGATIVE Negative   Urobilinogen, UA 0.2 0.0 - 1.0   Leukocytes,Ua TRACE (A) Negative   Nitrite NEGATIVE Negative   WBC, UA 0-2/hpf 0-2/hpf   RBC / HPF none seen 0-2/hpf  TSH     Status: Abnormal   Collection Time: 08/25/20 10:08 AM  Result Value Ref Range   TSH 13.90 (H) 0.35 - 4.50 uIU/mL  CBC with Differential/Platelet     Status: Abnormal   Collection Time: 08/25/20 10:08 AM  Result Value Ref Range   WBC 6.0 4.0 - 10.5 K/uL   RBC 5.24 4.22 - 5.81 Mil/uL   Hemoglobin 15.2 13.0 -  17.0 g/dL   HCT 45.9 39.0 - 52.0 %   MCV 87.5 78.0 - 100.0 fl   MCHC 33.1 30.0 - 36.0 g/dL   RDW 15.6 (H) 11.5 - 15.5 %   Platelets 257.0 150.0 - 400.0 K/uL   Neutrophils Relative % 50.5 43.0 - 77.0 %   Lymphocytes Relative 38.0 12.0 - 46.0 %   Monocytes Relative 9.3 3.0 - 12.0 %   Eosinophils Relative 1.6 0.0 - 5.0 %   Basophils Relative 0.6 0.0 - 3.0 %   Neutro Abs 3.1 1.4 - 7.7 K/uL   Lymphs Abs 2.3 0.7 - 4.0 K/uL   Monocytes Absolute 0.6 0.1 - 1.0 K/uL   Eosinophils Absolute 0.1 0.0 - 0.7 K/uL   Basophils Absolute 0.0 0.0 - 0.1 K/uL  Hepatic function panel     Status: None   Collection Time: 08/25/20 10:08 AM  Result Value Ref Range   Total Bilirubin 0.5 0.2 - 1.2 mg/dL   Bilirubin, Direct 0.1 0.0 - 0.3 mg/dL   Alkaline Phosphatase 88 39 - 117 U/L   AST 14 0 - 37 U/L   ALT 10 0 - 53 U/L   Total Protein 6.8 6.0 - 8.3 g/dL   Albumin 4.2 3.5 - 5.2 g/dL  Lipid panel     Status: Abnormal   Collection  Time: 08/25/20 10:08 AM  Result Value Ref Range   Cholesterol 220 (H) 0 - 200 mg/dL   Triglycerides 218.0 (H) 0.0 - 149.0 mg/dL   HDL 45.30 >39.00 mg/dL   VLDL 43.6 (H) 0.0 - 40.0 mg/dL   Total CHOL/HDL Ratio 5    NonHDL 174.38   Hemoglobin A1c     Status: None   Collection Time: 08/25/20 10:08 AM  Result Value Ref Range   Hgb A1c MFr Bld 6.2 4.6 - 6.5 %  LDL cholesterol, direct     Status: None   Collection Time: 08/25/20 10:08 AM  Result Value Ref Range   Direct LDL 148.0 mg/dL      Social History   Tobacco Use  Smoking Status Former Smoker  . Packs/day: 2.00  . Years: 37.00  . Pack years: 74.00  . Quit date: 06/11/2000  . Years since quitting: 20.2  Smokeless Tobacco Never Used  Tobacco Comment   quit smoking cigarettes 2002 and quit cigars 2016    Goals Met:  Independence with exercise equipment Exercise tolerated well No report of cardiac concerns or symptoms Strength training completed today  Goals Unmet:  Not Applicable  Comments: check out 0915   Dr. Kathie Dike is Medical Director for [facility].

## 2020-08-26 NOTE — Telephone Encounter (Signed)
° ° °  Please return call to patient to discuss lab results °

## 2020-08-28 ENCOUNTER — Encounter: Payer: Self-pay | Admitting: Internal Medicine

## 2020-08-28 DIAGNOSIS — E559 Vitamin D deficiency, unspecified: Secondary | ICD-10-CM | POA: Insufficient documentation

## 2020-08-28 NOTE — Assessment & Plan Note (Signed)
Lab Results  Component Value Date   HGBA1C 6.2 08/25/2020   Stable, pt to continue current medical treatment  - diet and wt control

## 2020-08-28 NOTE — Assessment & Plan Note (Addendum)
Lab Results  Component Value Date   TSH 13.90 (H) 08/25/2020   uncontrolled, pt for increased levothyroxine

## 2020-08-28 NOTE — Assessment & Plan Note (Addendum)
Has been statin intolerant, now for continue zetia and low chol diet  Lab Results  Component Value Date   LDLCALC 140 (H) 04/27/2020

## 2020-08-28 NOTE — Assessment & Plan Note (Signed)
BP Readings from Last 3 Encounters:  08/25/20 138/68  08/01/20 120/68  07/06/20 118/74   Stable, pt to continue medical treatment lopressor  Current Outpatient Medications (Endocrine & Metabolic):  .  levothyroxine (SYNTHROID) 175 MCG tablet, Take 1 tablet (175 mcg total) by mouth daily before breakfast.  Current Outpatient Medications (Cardiovascular):  .  ezetimibe (ZETIA) 10 MG tablet, Take 1 tablet (10 mg total) by mouth daily. .  metoprolol tartrate (LOPRESSOR) 50 MG tablet, Take 1 tablet (50 mg total) by mouth 2 (two) times daily. .  nitroGLYCERIN (NITROSTAT) 0.4 MG SL tablet, Place 1 tablet (0.4 mg total) under the tongue every 5 (five) minutes as needed for chest pain.   Current Outpatient Medications (Analgesics):  .  aspirin EC 81 MG tablet, Take 81 mg by mouth daily. Swallow whole.   Current Outpatient Medications (Other):  .  ascorbic acid (VITAMIN C) 500 MG tablet, Take 500 mg by mouth daily. .  Ca Carbonate-Mag Hydroxide (ROLAIDS PO), Take 1 tablet by mouth 3 (three) times daily as needed (heartburn). .  meclizine (ANTIVERT) 12.5 MG tablet, Take 1 tablet (12.5 mg total) by mouth 3 (three) times daily as needed for dizziness. Marland Kitchen  omeprazole (PRILOSEC) 20 MG capsule, Take 20 mg by mouth daily as needed (uses OTC for heartburn). .  polyethylene glycol (MIRALAX / GLYCOLAX) 17 g packet, Take 17 g by mouth daily. .  Turmeric Curcumin 500 MG CAPS, Take 1,000 mg by mouth daily. .  Cholecalciferol (THERA-D 2000) 50 MCG (2000 UT) TABS, 1 tab by mouth once daily

## 2020-08-28 NOTE — Assessment & Plan Note (Signed)
For zetia and lower chol diet, declines referral lipid clinic for now

## 2020-08-28 NOTE — Assessment & Plan Note (Signed)
Has been statin intolerant, now for continue zetia and low chol diet

## 2020-08-28 NOTE — Assessment & Plan Note (Signed)
Last vitamin D Lab Results  Component Value Date   VD25OH 28.67 (L) 08/25/2020   Low, to start oral replacement - 2000 u qd

## 2020-08-28 NOTE — Assessment & Plan Note (Signed)
Asympt, for psa f/u ?

## 2020-08-29 ENCOUNTER — Encounter (HOSPITAL_COMMUNITY)
Admission: RE | Admit: 2020-08-29 | Discharge: 2020-08-29 | Disposition: A | Discharge status: Home / Self Care | Payer: Medicare Other | Source: Ambulatory Visit | Attending: Cardiology | Admitting: Cardiology

## 2020-08-29 ENCOUNTER — Other Ambulatory Visit: Payer: Self-pay

## 2020-08-29 VITALS — Wt 176.1 lb

## 2020-08-29 DIAGNOSIS — Z951 Presence of aortocoronary bypass graft: Secondary | ICD-10-CM | POA: Diagnosis not present

## 2020-08-29 NOTE — Progress Notes (Signed)
Daily Session Note  Patient Details  Name: Guy Morris MRN: [id number] Date of Birth: [birth year] Referring Provider:   Flowsheet Row CARDIAC REHAB PHASE II ORIENTATION from 07/06/2020 in [place]  Referring Provider [name]      Encounter Date: 08/29/2020  Check In:  Session Check In - 08/29/20 0815      Check-In   Supervising physician immediately available to respond to emergencies [facility] MD immediately available    Physician(s) Dr. Harl Bowie    Location [facility]    Staff Present Cathren Harsh, MS, Exercise Physiologist;Dalton Kris Mouton, MS, ACSM-CEP, Exercise Physiologist    Virtual Visit No    Medication changes reported     No    Fall or balance concerns reported    No    Tobacco Cessation No Change    Warm-up and Cool-down Performed as group-led instruction    Resistance Training Performed Yes    VAD Patient? No    PAD/SET Patient? No      Pain Assessment   Currently in Pain? No/denies    Multiple Pain Sites No           Capillary Blood Glucose: No results found for this or any previous visit (from the past 24 hour(s)).    Social History   Tobacco Use  Smoking Status Former Smoker  . Packs/day: 2.00  . Years: 37.00  . Pack years: 74.00  . Quit date: 06/11/2000  . Years since quitting: 20.2  Smokeless Tobacco Never Used  Tobacco Comment   quit smoking cigarettes 2002 and quit cigars 2016    Goals Met:  Independence with exercise equipment Exercise tolerated well No report of cardiac concerns or symptoms Strength training completed today  Goals Unmet:  Not Applicable  Comments: checkout 0915   Dr. Kathie Dike is Medical Director for [facility].

## 2020-08-31 ENCOUNTER — Encounter (HOSPITAL_COMMUNITY)
Admission: RE | Admit: 2020-08-31 | Discharge: 2020-08-31 | Disposition: A | Discharge status: Home / Self Care | Payer: Medicare Other | Source: Ambulatory Visit | Attending: Cardiology | Admitting: Cardiology

## 2020-08-31 ENCOUNTER — Other Ambulatory Visit: Payer: Self-pay

## 2020-08-31 DIAGNOSIS — Z951 Presence of aortocoronary bypass graft: Secondary | ICD-10-CM

## 2020-08-31 NOTE — Progress Notes (Signed)
Daily Session Note  Patient Details  Name: Guy Morris MRN: [id number] Date of Birth: 07/02/1950 Referring Provider:   Flowsheet Row CARDIAC REHAB PHASE II ORIENTATION from 07/06/2020 in [place]  Referring Provider [name]      Encounter Date: 08/31/2020  Check In:  Session Check In - 08/31/20 0833      Check-In   Supervising physician immediately available to respond to emergencies [facility] MD immediately available    Physician(s) Dr. Harl Bowie    Location [facility]    Staff Present Cathren Harsh, MS, Exercise Physiologist;Dalton Kris Mouton, MS, ACSM-CEP, Exercise Physiologist    Virtual Visit No    Medication changes reported     No    Fall or balance concerns reported    No    Tobacco Cessation No Change    Warm-up and Cool-down Performed as group-led instruction    Resistance Training Performed Yes    VAD Patient? No    PAD/SET Patient? No      Pain Assessment   Currently in Pain? No/denies    Multiple Pain Sites No           Capillary Blood Glucose: No results found for this or any previous visit (from the past 24 hour(s)).    Social History   Tobacco Use  Smoking Status Former Smoker  . Packs/day: 2.00  . Years: 37.00  . Pack years: 74.00  . Quit date: 06/11/2000  . Years since quitting: 20.2  Smokeless Tobacco Never Used  Tobacco Comment   quit smoking cigarettes 2002 and quit cigars 2016    Goals Met:  Independence with exercise equipment Exercise tolerated well No report of cardiac concerns or symptoms Strength training completed today  Goals Unmet:  Not Applicable  Comments: check out 0915   Dr. Kathie Dike is Medical Director for [facility].

## 2020-09-02 ENCOUNTER — Other Ambulatory Visit: Payer: Self-pay

## 2020-09-02 ENCOUNTER — Encounter (HOSPITAL_COMMUNITY)
Admission: RE | Admit: 2020-09-02 | Discharge: 2020-09-02 | Disposition: A | Discharge status: Home / Self Care | Payer: Medicare Other | Source: Ambulatory Visit | Attending: Cardiology | Admitting: Cardiology

## 2020-09-02 DIAGNOSIS — Z951 Presence of aortocoronary bypass graft: Secondary | ICD-10-CM

## 2020-09-02 NOTE — Progress Notes (Signed)
Daily Session Note  Patient Details  Name: Guy Morris MRN: [id number] Date of Birth: 06/25/1950 Referring Provider:   Flowsheet Row CARDIAC REHAB PHASE II ORIENTATION from 07/06/2020 in [place] [facility]  Referring Provider Dr. Hochrein      Encounter Date: 09/02/2020  Check In:  Session Check In - 09/02/20 0815      Check-In   Physician(s) Dr. Ross    Location [facility]    Staff Present Madison Karch, MS, Exercise Physiologist;Dalton Fletcher, MS, ACSM-CEP, Exercise Physiologist    Virtual Visit No    Medication changes reported     No    Fall or balance concerns reported    No    Tobacco Cessation No Change    Warm-up and Cool-down Performed as group-led instruction    Resistance Training Performed Yes    VAD Patient? No    PAD/SET Patient? No      Pain Assessment   Currently in Pain? No/denies    Multiple Pain Sites No           Capillary Blood Glucose: No results found for this or any previous visit (from the past 24 hour(s)).    Social History   Tobacco Use  Smoking Status Former Smoker  . Packs/day: 2.00  . Years: 37.00  . Pack years: 74.00  . Quit date: 06/11/2000  . Years since quitting: 20.2  Smokeless Tobacco Never Used  Tobacco Comment   quit smoking cigarettes 2002 and quit cigars 2016    Goals Met:  Independence with exercise equipment Exercise tolerated well No report of cardiac concerns or symptoms Strength training completed today  Goals Unmet:  Not Applicable  Comments: check out 0915   Dr. Jehanzeb Memon is Medical Director for [facility]. 

## 2020-09-05 ENCOUNTER — Encounter (HOSPITAL_COMMUNITY)
Admission: RE | Admit: 2020-09-05 | Discharge: 2020-09-05 | Disposition: A | Discharge status: Home / Self Care | Payer: Medicare Other | Source: Ambulatory Visit | Attending: Cardiology | Admitting: Cardiology

## 2020-09-05 ENCOUNTER — Other Ambulatory Visit: Payer: Self-pay

## 2020-09-05 DIAGNOSIS — Z951 Presence of aortocoronary bypass graft: Secondary | ICD-10-CM

## 2020-09-05 NOTE — Progress Notes (Signed)
Daily Session Note  Patient Details  Name: Guy Morris MRN: [id number] Date of Birth: May 12, 1951 Referring Provider:   Flowsheet Row CARDIAC REHAB PHASE II ORIENTATION from 07/06/2020 in [place]  Referring Provider [name]      Encounter Date: 09/05/2020  Check In:  Session Check In - 09/05/20 0815      Check-In   Supervising physician immediately available to respond to emergencies [facility] MD immediately available    Physician(s) Dr. Domenic Polite    Location [facility]    Staff Present Cathren Harsh, MS, Exercise Physiologist;Dalton Kris Mouton, MS, ACSM-CEP, Exercise Physiologist    Virtual Visit No    Medication changes reported     No    Fall or balance concerns reported    No    Tobacco Cessation No Change    Warm-up and Cool-down Performed as group-led instruction    Resistance Training Performed Yes    VAD Patient? No    PAD/SET Patient? No      Pain Assessment   Currently in Pain? No/denies    Multiple Pain Sites No           Capillary Blood Glucose: No results found for this or any previous visit (from the past 24 hour(s)).    Social History   Tobacco Use  Smoking Status Former Smoker  . Packs/day: 2.00  . Years: 37.00  . Pack years: 74.00  . Quit date: 06/11/2000  . Years since quitting: 20.2  Smokeless Tobacco Never Used  Tobacco Comment   quit smoking cigarettes 2002 and quit cigars 2016    Goals Met:  Independence with exercise equipment Exercise tolerated well No report of cardiac concerns or symptoms Strength training completed today  Goals Unmet:  Not Applicable  Comments: check out 0915   Dr. Kathie Dike is Medical Director for [facility].

## 2020-09-07 ENCOUNTER — Other Ambulatory Visit: Payer: Self-pay

## 2020-09-07 ENCOUNTER — Encounter (HOSPITAL_COMMUNITY)
Admission: RE | Admit: 2020-09-07 | Discharge: 2020-09-07 | Disposition: A | Discharge status: Home / Self Care | Payer: Medicare Other | Source: Ambulatory Visit | Attending: Cardiology | Admitting: Cardiology

## 2020-09-07 DIAGNOSIS — Z951 Presence of aortocoronary bypass graft: Secondary | ICD-10-CM | POA: Diagnosis not present

## 2020-09-07 NOTE — Telephone Encounter (Signed)
Patient called to discuss lab results. He can be reached at [phone]

## 2020-09-07 NOTE — Progress Notes (Signed)
Daily Session Note  Patient Details  Name: Guy Morris MRN: [id number] Date of Birth: 03-15-1951 Referring Provider:   Flowsheet Row CARDIAC REHAB PHASE II ORIENTATION from 07/06/2020 in [place]  Referring Provider [name]      Encounter Date: 09/07/2020  Check In:  Session Check In - 09/07/20 0815      Check-In   Supervising physician immediately available to respond to emergencies [facility] MD immediately available    Physician(s) Dr. Domenic Polite    Location [facility]    Staff Present Cathren Harsh, MS, Exercise Physiologist;Dalton Kris Mouton, MS, ACSM-CEP, Exercise Physiologist    Virtual Visit No    Medication changes reported     No    Fall or balance concerns reported    No    Tobacco Cessation No Change    Warm-up and Cool-down Performed as group-led instruction    Resistance Training Performed Yes    VAD Patient? No    PAD/SET Patient? No      Pain Assessment   Currently in Pain? No/denies    Multiple Pain Sites No           Capillary Blood Glucose: No results found for this or any previous visit (from the past 24 hour(s)).    Social History   Tobacco Use  Smoking Status Former Smoker  . Packs/day: 2.00  . Years: 37.00  . Pack years: 74.00  . Quit date: 06/11/2000  . Years since quitting: 20.2  Smokeless Tobacco Never Used  Tobacco Comment   quit smoking cigarettes 2002 and quit cigars 2016    Goals Met:  Independence with exercise equipment Exercise tolerated well No report of cardiac concerns or symptoms Strength training completed today  Goals Unmet:  Not Applicable  Comments: check out 0915   Dr. Kathie Dike is Medical Director for [facility].

## 2020-09-09 ENCOUNTER — Other Ambulatory Visit: Payer: Self-pay

## 2020-09-09 ENCOUNTER — Encounter (HOSPITAL_COMMUNITY)
Admission: RE | Admit: 2020-09-09 | Discharge: 2020-09-09 | Disposition: A | Discharge status: Home / Self Care | Payer: Medicare Other | Source: Ambulatory Visit | Attending: Cardiology | Admitting: Cardiology

## 2020-09-09 DIAGNOSIS — Z951 Presence of aortocoronary bypass graft: Secondary | ICD-10-CM | POA: Diagnosis not present

## 2020-09-09 NOTE — Progress Notes (Signed)
Daily Session Note  Patient Details  Name: Guy Morris MRN: [id number] Date of Birth: 1950-10-22 Referring Provider:   Flowsheet Row CARDIAC REHAB PHASE II ORIENTATION from 07/06/2020 in [place]  Referring Provider [name]      Encounter Date: 09/09/2020  Check In:  Session Check In - 09/09/20 0815      Check-In   Supervising physician immediately available to respond to emergencies [facility] MD immediately available    Physician(s) Dr. Harl Bowie    Location [facility]    Staff Present Cathren Harsh, MS, Exercise Physiologist;Dalton Kris Mouton, MS, ACSM-CEP, Exercise Physiologist    Virtual Visit No    Medication changes reported     No    Fall or balance concerns reported    No    Tobacco Cessation No Change    Warm-up and Cool-down Performed as group-led instruction    Resistance Training Performed Yes    VAD Patient? No    PAD/SET Patient? No      Pain Assessment   Currently in Pain? No/denies    Multiple Pain Sites No           Capillary Blood Glucose: No results found for this or any previous visit (from the past 24 hour(s)).    Social History   Tobacco Use  Smoking Status Former Smoker  . Packs/day: 2.00  . Years: 37.00  . Pack years: 74.00  . Quit date: 06/11/2000  . Years since quitting: 20.2  Smokeless Tobacco Never Used  Tobacco Comment   quit smoking cigarettes 2002 and quit cigars 2016    Goals Met:  Independence with exercise equipment Exercise tolerated well No report of cardiac concerns or symptoms Strength training completed today  Goals Unmet:  Not Applicable  Comments: check out 0915   Dr. Kathie Dike is Medical Director for [facility].

## 2020-09-12 ENCOUNTER — Other Ambulatory Visit: Payer: Self-pay

## 2020-09-12 ENCOUNTER — Encounter (HOSPITAL_COMMUNITY)
Admission: RE | Admit: 2020-09-12 | Discharge: 2020-09-12 | Disposition: A | Discharge status: Home / Self Care | Payer: Medicare Other | Source: Ambulatory Visit | Attending: Cardiology | Admitting: Cardiology

## 2020-09-12 VITALS — Wt 176.4 lb

## 2020-09-12 DIAGNOSIS — Z951 Presence of aortocoronary bypass graft: Secondary | ICD-10-CM | POA: Diagnosis not present

## 2020-09-12 NOTE — Progress Notes (Signed)
Daily Session Note  Patient Details  Name: Guy Morris MRN: [id number] Date of Birth: Jun 17, 1950 Referring Provider:   Flowsheet Row CARDIAC REHAB PHASE II ORIENTATION from 07/06/2020 in [place]  Referring Provider [name]      Encounter Date: 09/12/2020  Check In:  Session Check In - 09/12/20 0815      Check-In   Supervising physician immediately available to respond to emergencies [facility] MD immediately available    Physician(s) Dr. Harl Bowie    Location [facility]    Staff Present Cathren Harsh, MS, Exercise Physiologist;Dalton Kris Mouton, MS, ACSM-CEP, Exercise Physiologist    Virtual Visit No    Medication changes reported     No    Fall or balance concerns reported    No    Tobacco Cessation No Change    Warm-up and Cool-down Performed as group-led instruction    Resistance Training Performed Yes    VAD Patient? No    PAD/SET Patient? No      Pain Assessment   Currently in Pain? No/denies    Multiple Pain Sites No           Capillary Blood Glucose: No results found for this or any previous visit (from the past 24 hour(s)).    Social History   Tobacco Use  Smoking Status Former Smoker  . Packs/day: 2.00  . Years: 37.00  . Pack years: 74.00  . Quit date: 06/11/2000  . Years since quitting: 20.2  Smokeless Tobacco Never Used  Tobacco Comment   quit smoking cigarettes 2002 and quit cigars 2016    Goals Met:  Independence with exercise equipment Exercise tolerated well No report of cardiac concerns or symptoms Strength training completed today  Goals Unmet:  Not Applicable  Comments: check out 0915   Dr. Kathie Dike is Medical Director for [facility].

## 2020-09-14 ENCOUNTER — Encounter (HOSPITAL_COMMUNITY)
Admission: RE | Admit: 2020-09-14 | Discharge: 2020-09-14 | Disposition: A | Discharge status: Home / Self Care | Payer: Medicare Other | Source: Ambulatory Visit | Attending: Cardiology | Admitting: Cardiology

## 2020-09-14 ENCOUNTER — Other Ambulatory Visit: Payer: Self-pay

## 2020-09-14 DIAGNOSIS — Z951 Presence of aortocoronary bypass graft: Secondary | ICD-10-CM | POA: Diagnosis not present

## 2020-09-14 NOTE — Progress Notes (Signed)
Daily Session Note  Patient Details  Name: Guy Morris MRN: [id number] Date of Birth: 1950-07-30 Referring Provider:   Flowsheet Row CARDIAC REHAB PHASE II ORIENTATION from 07/06/2020 in [place]  Referring Provider [name]      Encounter Date: 09/14/2020  Check In:  Session Check In - 09/14/20 0815      Check-In   Supervising physician immediately available to respond to emergencies [facility] MD immediately available    Physician(s) Dr. Harl Bowie    Location [facility]    Staff Present Cathren Harsh, MS, Exercise Physiologist;Dalton Kris Mouton, MS, ACSM-CEP, Exercise Physiologist    Virtual Visit No    Medication changes reported     No    Fall or balance concerns reported    No    Tobacco Cessation No Change    Warm-up and Cool-down Performed as group-led instruction    Resistance Training Performed Yes    VAD Patient? No    PAD/SET Patient? No      Pain Assessment   Currently in Pain? No/denies    Multiple Pain Sites No           Capillary Blood Glucose: No results found for this or any previous visit (from the past 24 hour(s)).    Social History   Tobacco Use  Smoking Status Former Smoker  . Packs/day: 2.00  . Years: 37.00  . Pack years: 74.00  . Quit date: 06/11/2000  . Years since quitting: 20.2  Smokeless Tobacco Never Used  Tobacco Comment   quit smoking cigarettes 2002 and quit cigars 2016    Goals Met:  Independence with exercise equipment Exercise tolerated well No report of cardiac concerns or symptoms Strength training completed today  Goals Unmet:  Not Applicable  Comments: checkout 0915   Dr. Kathie Dike is Medical Director for [facility].

## 2020-09-14 NOTE — Progress Notes (Signed)
Cardiac Individual Treatment Plan  Patient Details  Name: Guy Morris MRN: [id number] Date of Birth: 10-Jul-1950 Referring Provider:   Flowsheet Row CARDIAC REHAB PHASE II ORIENTATION from 07/06/2020 in [facility]  Referring Provider [name]      Initial Encounter Date:  Flowsheet Row CARDIAC REHAB PHASE II ORIENTATION from 07/06/2020 in [facility]  Date 07/06/20      Visit Diagnosis: S/P CABG x 4  Patient's Home Medications on Admission:  Current Outpatient Medications:  .  ascorbic acid (VITAMIN C) 500 MG tablet, Take 500 mg by mouth daily., Disp: , Rfl:  .  aspirin EC 81 MG tablet, Take 81 mg by mouth daily. Swallow whole., Disp: , Rfl:  .  Ca Carbonate-Mag Hydroxide (ROLAIDS PO), Take 1 tablet by mouth 3 (three) times daily as needed (heartburn)., Disp: , Rfl:  .  Cholecalciferol (THERA-D 2000) 50 MCG (2000 UT) TABS, 1 tab by mouth once daily, Disp: 30 tablet, Rfl: 99 .  ezetimibe (ZETIA) 10 MG tablet, Take 1 tablet (10 mg total) by mouth daily., Disp: 30 tablet, Rfl: 3 .  levothyroxine (SYNTHROID) 175 MCG tablet, Take 1 tablet (175 mcg total) by mouth daily before breakfast., Disp: 90 tablet, Rfl: 3 .  meclizine (ANTIVERT) 12.5 MG tablet, Take 1 tablet (12.5 mg total) by mouth 3 (three) times daily as needed for dizziness., Disp: 30 tablet, Rfl: 2 .  metoprolol tartrate (LOPRESSOR) 50 MG tablet, Take 1 tablet (50 mg total) by mouth 2 (two) times daily., Disp: 180 tablet, Rfl: 3 .  nitroGLYCERIN (NITROSTAT) 0.4 MG SL tablet, Place 1 tablet (0.4 mg total) under the tongue every 5 (five) minutes as needed for chest pain., Disp: 90 tablet, Rfl: 3 .  omeprazole (PRILOSEC) 20 MG capsule, Take 20 mg by mouth daily as needed (uses OTC for heartburn)., Disp: , Rfl:  .  polyethylene glycol (MIRALAX / GLYCOLAX) 17 g packet, Take 17 g by mouth daily., Disp: 14 each, Rfl: 0 .  Turmeric Curcumin 500 MG CAPS, Take 1,000 mg by mouth daily.,  Disp: , Rfl:   Past Medical History: Past Medical History:  Diagnosis Date  . ABNORMAL TRANSAMINASE-LFT'S 08/04/2008   Qualifier: Diagnosis of  By: Fuller Plan MD FACG, [place] RHINITIS 05/06/2007   Qualifier: Diagnosis of  By: Jenny Reichmann MD, Hunt Oris   . Allergy   . BPH (benign prostatic hyperplasia) 04/28/2012  . COPD (chronic obstructive pulmonary disease) ([place])   . Degenerative joint disease 04/23/2011  . ERECTILE DYSFUNCTION 05/06/2007   Qualifier: Diagnosis of  By: Jenny Reichmann MD, Hunt Oris   . GERD 05/06/2007   Qualifier: Diagnosis of  By: Jenny Reichmann MD, Hunt Oris   . HYPERLIPIDEMIA 05/06/2007   Qualifier: Diagnosis of  By: Jenny Reichmann MD, Hunt Oris   . HYPOTHYROIDISM 05/06/2007   Qualifier: Diagnosis of  By: Jenny Reichmann MD, Hunt Oris   . Insomnia 04/28/2012  . OSTEOARTHRITIS, KNEE, 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   Qualifier: Diagnosis of  By: Jenny Reichmann MD, Hunt Oris   . Personal history of colonic polyps 07/20/2008   Centricity Description: PERSONAL HX COLONIC POLYPS Qualifier: Diagnosis of  By: Ardis Hughs MD, Melene Plan  Centricity Description: COLONIC POLYPS, HX OF Qualifier: Diagnosis of  By: Jenny Reichmann MD, Hunt Oris   . RLS (restless legs syndrome) 10/17/2010  . Urge incontinence 04/28/2012   [facility]icare heps per urology    Tobacco Use: Social History   Tobacco Use  Smoking Status Former Smoker  . Packs/day: 2.00  .  Years: 37.00  . Pack years: 74.00  . Quit date: 06/11/2000  . Years since quitting: 20.2  Smokeless Tobacco Never Used  Tobacco Comment   quit smoking cigarettes 2002 and quit cigars 2016    Labs: Recent Review Flowsheet Data    Labs for ITP Cardiac and Pulmonary Rehab Latest Ref Rng & Units 04/30/2020 05/01/2020 05/01/2020 05/01/2020 08/25/2020   Cholestrol 0 - 200 mg/dL - - - - 220(H)   LDLCALC 0 - 99 mg/dL - - - - -   LDLDIRECT mg/dL - - - - 148.0   HDL >39.00 mg/dL - - - - 45.30   Trlycerides 0.0 - 149.0 mg/dL - - - - 218.0(H)   Hemoglobin A1c 4.6 - 6.5 % - - - - 6.2   PHART 7.350 - 7.450 - - - - -   PCO2ART  32.0 - 48.0 mmHg - - - - -   HCO3 20.0 - 28.0 mmol/L - - - - -   TCO2 22 - 32 mmol/L - - - - -   ACIDBASEDEF 0.0 - 2.0 mmol/L - - - - -   O2SAT % 60.6 49.7 40.1 46.7 -      Capillary Blood Glucose: Lab Results  Component Value Date   GLUCAP 84 05/08/2020   GLUCAP 122 (H) 05/02/2020   GLUCAP 117 (H) 05/02/2020   GLUCAP 132 (H) 05/02/2020   GLUCAP 119 (H) 05/02/2020     Exercise Target Goals: Exercise Program Goal: Individual exercise prescription set using results from initial 6 min walk test and THRR while considering  patient's activity barriers and safety.   Exercise Prescription Goal: Starting with aerobic activity 30 plus minutes a day, 3 days per week for initial exercise prescription. Provide home exercise prescription and guidelines that participant acknowledges understanding prior to discharge.  Activity Barriers & Risk Stratification:  Activity Barriers & Cardiac Risk Stratification - 07/06/20 1258      Activity Barriers & Cardiac Risk Stratification   Activity Barriers Arthritis;Right Knee Replacement;Joint Problems;Shortness of Breath;Balance Concerns;History of Falls    Cardiac Risk Stratification High           6 Minute Walk:  6 Minute Walk    Row Name 07/06/20 1421         6 Minute Walk   Phase Initial     Distance 1300 feet     Walk Time 6 minutes     # of Rest Breaks 0     MPH 2.5     METS 2.99     RPE 12     VO2 Peak 10.49     Symptoms Yes (comment)     Comments SOB     Resting HR 68 bpm     Resting BP 118/74     Resting Oxygen Saturation  97 %     Exercise Oxygen Saturation  during 6 min walk 97 %     Max Ex. HR 83 bpm     Max Ex. BP 152/76     2 Minute Post BP 128/76            Oxygen Initial Assessment:   Oxygen Re-Evaluation:   Oxygen Discharge (Final Oxygen Re-Evaluation):   Initial Exercise Prescription:  Initial Exercise Prescription - 07/06/20 1400      Date of Initial Exercise RX and Referring Provider   Date  07/06/20    Referring Provider [name]    Expected Discharge Date 09/30/20      Recumbant Elliptical  Level 1    RPM 60    Minutes 39      Intensity   THRR 40-80% of Max Heartrate 61-122    Ratings of Perceived Exertion 11-13    Perceived Dyspnea 0-4      Resistance Training   Training Prescription Yes    Weight 3    Reps 10-15           Perform Capillary Blood Glucose checks as needed.  Exercise Prescription Changes:   Exercise Prescription Changes    Row Name 07/18/20 1000 07/18/20 1033 08/01/20 1000 08/15/20 0900 08/29/20 0900     Response to Exercise   Blood Pressure (Admit) 126/72 -- 112/76 110/76 106/62   Blood Pressure (Exercise) 158/82 -- 154/80 140/62 132/70   Blood Pressure (Exit) 122/78 -- 108/78 96/64 104/68   Heart Rate (Admit) 71 bpm -- 68 bpm 57 bpm 62 bpm   Heart Rate (Exercise) 100 bpm -- 99 bpm 109 bpm 94 bpm   Heart Rate (Exit) 79 bpm -- 75 bpm 64 bpm 69 bpm   Rating of Perceived Exertion (Exercise) 13 -- _0 Duration Continue with 30 min of aerobic exercise without signs/symptoms of physical distress. -- Continue with 30 min of aerobic exercise without signs/symptoms of physical distress. Continue with 30 min of aerobic exercise without signs/symptoms of physical distress. Continue with 30 min of aerobic exercise without signs/symptoms of physical distress.   Intensity THRR unchanged -- THRR unchanged THRR unchanged THRR unchanged     Progression   Progression Continue to progress workloads to maintain intensity without signs/symptoms of physical distress. -- Continue to progress workloads to maintain intensity without signs/symptoms of physical distress. Continue to progress workloads to maintain intensity without signs/symptoms of physical distress. Continue to progress workloads to maintain intensity without signs/symptoms of physical distress.     Resistance Training   Training Prescription Yes -- Yes Yes Yes   Weight 5 lbs -- 5 lbs 5  lbs 5 lbs   Reps 10-15 -- 10-15 10-15 10-15   Time 10 Minutes -- 10 Minutes 10 Minutes 10 Minutes     Arm Ergometer   Level 2 -- 2.9 2.5 3   Minutes 22 -- _1 METs 3.4 -- 3.5 5.1 4.9     Recumbant Elliptical   Level 1 -- _2 RPM 70 -- 75 75 70   Minutes 17 -- _3 METs 4.7 -- 5.4 60 4.9     Home Exercise Plan   Plans to continue exercise at -- Home (comment) -- -- --   Frequency -- Add 2 additional days to program exercise sessions. -- -- --   Initial Home Exercises Provided -- 07/18/20 -- -- --   [place] Name 09/12/20 1000             Response to Exercise   Blood Pressure (Admit) 116/80       Blood Pressure (Exercise) 142/84       Blood Pressure (Exit) 114/82       Heart Rate (Admit) 66 bpm       Heart Rate (Exercise) 96 bpm       Heart Rate (Exit) 75 bpm       Rating of Perceived Exertion (Exercise) 11       Duration Continue with 30 min of aerobic exercise without signs/symptoms of physical distress.       Intensity THRR unchanged  Progression   Progression Continue to progress workloads to maintain intensity without signs/symptoms of physical distress.               Resistance Training   Training Prescription Yes       Weight 5 lbs       Reps 10-15       Time 10 Minutes               Arm Ergometer   Level 3       Minutes 22       METs 5.6               Recumbant Elliptical   Level 3       RPM 74       Minutes 17       METs 5.3              Exercise Comments:   Exercise Goals and Review:   Exercise Goals    Row Name 07/06/20 1424 07/18/20 1031 08/15/20 0910 09/12/20 1040       Exercise Goals   Increase Physical Activity Yes Yes Yes Yes    Intervention Provide advice, education, support and counseling about physical activity/exercise needs.;Develop an individualized exercise prescription for aerobic and resistive training based on initial evaluation findings, risk stratification, comorbidities and participant's  personal goals. Provide advice, education, support and counseling about physical activity/exercise needs.;Develop an individualized exercise prescription for aerobic and resistive training based on initial evaluation findings, risk stratification, comorbidities and participant's personal goals. Provide advice, education, support and counseling about physical activity/exercise needs.;Develop an individualized exercise prescription for aerobic and resistive training based on initial evaluation findings, risk stratification, comorbidities and participant's personal goals. Provide advice, education, support and counseling about physical activity/exercise needs.;Develop an individualized exercise prescription for aerobic and resistive training based on initial evaluation findings, risk stratification, comorbidities and participant's personal goals.    Expected Outcomes Short Term: Attend rehab on a regular basis to increase amount of physical activity.;Long Term: Add in home exercise to make exercise part of routine and to increase amount of physical activity.;Long Term: Exercising regularly at least 3-5 days a week. Short Term: Attend rehab on a regular basis to increase amount of physical activity.;Long Term: Add in home exercise to make exercise part of routine and to increase amount of physical activity.;Long Term: Exercising regularly at least 3-5 days a week. Short Term: Attend rehab on a regular basis to increase amount of physical activity.;Long Term: Add in home exercise to make exercise part of routine and to increase amount of physical activity.;Long Term: Exercising regularly at least 3-5 days a week. Short Term: Attend rehab on a regular basis to increase amount of physical activity.;Long Term: Add in home exercise to make exercise part of routine and to increase amount of physical activity.;Long Term: Exercising regularly at least 3-5 days a week.    Increase Strength and Stamina Yes Yes Yes Yes     Intervention Provide advice, education, support and counseling about physical activity/exercise needs.;Develop an individualized exercise prescription for aerobic and resistive training based on initial evaluation findings, risk stratification, comorbidities and participant's personal goals. Provide advice, education, support and counseling about physical activity/exercise needs.;Develop an individualized exercise prescription for aerobic and resistive training based on initial evaluation findings, risk stratification, comorbidities and participant's personal goals. Provide advice, education, support and counseling about physical activity/exercise needs.;Develop an individualized exercise prescription for aerobic and resistive training based on initial evaluation findings, risk stratification,  comorbidities and participant's personal goals. Provide advice, education, support and counseling about physical activity/exercise needs.;Develop an individualized exercise prescription for aerobic and resistive training based on initial evaluation findings, risk stratification, comorbidities and participant's personal goals.    Expected Outcomes Short Term: Increase workloads from initial exercise prescription for resistance, speed, and METs.;Short Term: Perform resistance training exercises routinely during rehab and add in resistance training at home;Long Term: Improve cardiorespiratory fitness, muscular endurance and strength as measured by increased METs and functional capacity (6MWT) Short Term: Increase workloads from initial exercise prescription for resistance, speed, and METs.;Short Term: Perform resistance training exercises routinely during rehab and add in resistance training at home;Long Term: Improve cardiorespiratory fitness, muscular endurance and strength as measured by increased METs and functional capacity (6MWT) Short Term: Increase workloads from initial exercise prescription for resistance, speed, and  METs.;Short Term: Perform resistance training exercises routinely during rehab and add in resistance training at home;Long Term: Improve cardiorespiratory fitness, muscular endurance and strength as measured by increased METs and functional capacity (6MWT) Short Term: Increase workloads from initial exercise prescription for resistance, speed, and METs.;Short Term: Perform resistance training exercises routinely during rehab and add in resistance training at home;Long Term: Improve cardiorespiratory fitness, muscular endurance and strength as measured by increased METs and functional capacity (6MWT)    Able to understand and use rate of perceived exertion (RPE) scale Yes Yes Yes Yes    Intervention Provide education and explanation on how to use RPE scale Provide education and explanation on how to use RPE scale Provide education and explanation on how to use RPE scale Provide education and explanation on how to use RPE scale    Expected Outcomes Short Term: Able to use RPE daily in rehab to express subjective intensity level;Long Term:  Able to use RPE to guide intensity level when exercising independently Short Term: Able to use RPE daily in rehab to express subjective intensity level;Long Term:  Able to use RPE to guide intensity level when exercising independently Short Term: Able to use RPE daily in rehab to express subjective intensity level;Long Term:  Able to use RPE to guide intensity level when exercising independently Short Term: Able to use RPE daily in rehab to express subjective intensity level;Long Term:  Able to use RPE to guide intensity level when exercising independently    Knowledge and understanding of Target Heart Rate Range (THRR) Yes Yes Yes Yes    Intervention Provide education and explanation of THRR including how the numbers were predicted and where they are located for reference Provide education and explanation of THRR including how the numbers were predicted and where they are  located for reference Provide education and explanation of THRR including how the numbers were predicted and where they are located for reference Provide education and explanation of THRR including how the numbers were predicted and where they are located for reference    Expected Outcomes Short Term: Able to state/look up THRR;Long Term: Able to use THRR to govern intensity when exercising independently;Short Term: Able to use daily as guideline for intensity in rehab Short Term: Able to state/look up THRR;Long Term: Able to use THRR to govern intensity when exercising independently;Short Term: Able to use daily as guideline for intensity in rehab Short Term: Able to state/look up THRR;Long Term: Able to use THRR to govern intensity when exercising independently;Short Term: Able to use daily as guideline for intensity in rehab Short Term: Able to state/look up THRR;Long Term: Able to use THRR to govern  intensity when exercising independently;Short Term: Able to use daily as guideline for intensity in rehab    Able to check pulse independently Yes Yes Yes Yes    Intervention Provide education and demonstration on how to check pulse in carotid and radial arteries.;Review the importance of being able to check your own pulse for safety during independent exercise Provide education and demonstration on how to check pulse in carotid and radial arteries.;Review the importance of being able to check your own pulse for safety during independent exercise Provide education and demonstration on how to check pulse in carotid and radial arteries.;Review the importance of being able to check your own pulse for safety during independent exercise Provide education and demonstration on how to check pulse in carotid and radial arteries.;Review the importance of being able to check your own pulse for safety during independent exercise    Expected Outcomes Short Term: Able to explain why pulse checking is important during  independent exercise;Long Term: Able to check pulse independently and accurately Short Term: Able to explain why pulse checking is important during independent exercise;Long Term: Able to check pulse independently and accurately Short Term: Able to explain why pulse checking is important during independent exercise;Long Term: Able to check pulse independently and accurately Short Term: Able to explain why pulse checking is important during independent exercise;Long Term: Able to check pulse independently and accurately    Understanding of Exercise Prescription Yes Yes Yes Yes    Intervention Provide education, explanation, and written materials on patient's individual exercise prescription Provide education, explanation, and written materials on patient's individual exercise prescription Provide education, explanation, and written materials on patient's individual exercise prescription Provide education, explanation, and written materials on patient's individual exercise prescription    Expected Outcomes Short Term: Able to explain program exercise prescription;Long Term: Able to explain home exercise prescription to exercise independently Short Term: Able to explain program exercise prescription;Long Term: Able to explain home exercise prescription to exercise independently Short Term: Able to explain program exercise prescription;Long Term: Able to explain home exercise prescription to exercise independently Short Term: Able to explain program exercise prescription;Long Term: Able to explain home exercise prescription to exercise independently           Exercise Goals Re-Evaluation :  Exercise Goals Re-Evaluation    Row Name 07/18/20 1032 08/15/20 0911 09/12/20 1041         Exercise Goal Re-Evaluation   Exercise Goals Review Increase Physical Activity;Increase Strength and Stamina;Able to understand and use rate of perceived exertion (RPE) scale;Knowledge and understanding of Target Heart Rate  Range (THRR);Able to check pulse independently;Understanding of Exercise Prescription Increase Physical Activity;Increase Strength and Stamina;Able to understand and use rate of perceived exertion (RPE) scale;Knowledge and understanding of Target Heart Rate Range (THRR);Able to check pulse independently;Understanding of Exercise Prescription Increase Physical Activity;Increase Strength and Stamina;Able to understand and use rate of perceived exertion (RPE) scale;Knowledge and understanding of Target Heart Rate Range (THRR);Able to check pulse independently;Understanding of Exercise Prescription     Comments Patient  has completed 4 exercise sessions. He tolerating exercise well, but complains of a little bit of knee pain. He says he is going to the doctor to see about a knee replacement. He had to switch to the arm ergometer for the second station because of his knee pain. He says this feels much better. He is progressing well and continues to ask to increase his intensities. He is enthusiastic about coming to rehab and is very pleasant to be around.  He is currently exercising at 4.7 METs on the Elliptical. Will contine to progress as able. Patient has completed 16 exercise sessions. He has tolerated exercise well and is progressing very well. He still complains of knee pain but it is not as much. He says his knee gets sore and stiff after exercise. He says that exercising is helping him so much at home. He feels he is getting stronger and is able to do much more at home. He is still exercising at home and is progressing his intensities at home. He is currently exercising at 6.0 METs on the Elliptical. Will continue to monitor and progress as able. Patient has completed 28 execise sessions. He has progressed very nicely throughout the program. He is getting stonger and his stamina is increasing. He complains a little bit about knee stiffness after exercising on the elliptical. The last few exercise sessions he had  complained of a pain in his rib on his left side. He said it felt like a cramp. The pain has not been back in 2 exercise sessions after he increased his water intake before exercise. He is currently exercising at home by walking and biking for 30 minutes per day when not in rehab. He also does some weight training at home. He is currently exercising at 5.3 METs on the elliptical. Will continue to monitor and progress as able.     Expected Outcomes Through exercise at rehab and with a home exercise program, patient will reach their goals. Through exercise at rehab and with a home exercise program, patient will reach their goals. Through exercise at rehab and with a home exercise program, patient will reach their goals.             Discharge Exercise Prescription (Final Exercise Prescription Changes):  Exercise Prescription Changes - 09/12/20 1000      Response to Exercise   Blood Pressure (Admit) 116/80    Blood Pressure (Exercise) 142/84    Blood Pressure (Exit) 114/82    Heart Rate (Admit) 66 bpm    Heart Rate (Exercise) 96 bpm    Heart Rate (Exit) 75 bpm    Rating of Perceived Exertion (Exercise) 11    Duration Continue with 30 min of aerobic exercise without signs/symptoms of physical distress.    Intensity THRR unchanged      Progression   Progression Continue to progress workloads to maintain intensity without signs/symptoms of physical distress.      Resistance Training   Training Prescription Yes    Weight 5 lbs    Reps 10-15    Time 10 Minutes      Arm Ergometer   Level 3    Minutes 22    METs 5.6      Recumbant Elliptical   Level 3    RPM 74    Minutes 17    METs 5.3           Nutrition:  Target Goals: Understanding of nutrition guidelines, daily intake of sodium <1561m, cholesterol <2054m calories 30% from fat and 7% or less from saturated fats, daily to have 5 or more servings of fruits and vegetables.  Biometrics:  Pre Biometrics - 09/12/20 1045       Pre Biometrics   Weight 80 kg    BMI (Calculated) 26.82            Nutrition Therapy Plan and Nutrition Goals:  Nutrition Therapy & Goals - 08/10/20 0944      Personal Nutrition Goals  Comments We will continue to provide heart heathy nutritional education through handouts.      Intervention Plan   Intervention Nutrition handout(s) given to patient.           Nutrition Assessments:  Nutrition Assessments - 07/06/20 1313      MEDFICTS Scores   Pre Score 61          MEDIFICTS Score Key:  ?70 Need to make dietary changes   40-70 Heart Healthy Diet  ? 40 Therapeutic Level Cholesterol Diet   Picture Your Plate Scores:  <57 Unhealthy dietary pattern with much room for improvement.  41-50 Dietary pattern unlikely to meet recommendations for good health and room for improvement.  51-60 More healthful dietary pattern, with some room for improvement.   >60 Healthy dietary pattern, although there may be some specific behaviors that could be improved.    Nutrition Goals Re-Evaluation:   Nutrition Goals Discharge (Final Nutrition Goals Re-Evaluation):   Psychosocial: Target Goals: Acknowledge presence or absence of significant depression and/or stress, maximize coping skills, provide positive support system. Participant is able to verbalize types and ability to use techniques and skills needed for reducing stress and depression.  Initial Review & Psychosocial Screening:  Initial Psych Review & Screening - 07/06/20 1259      Initial Review   Current issues with Current Stress Concerns    Source of Stress Concerns Chronic Illness    Comments His INR levels and the fact that he is taking coumadin stresses him out. He does not want to take it, but has been compliant since his doctor said that he needs to take it.      Family Dynamics   Good Support System? Yes    Comments His wife and children support him. He has grand children support him as well. He often talks  to his pastor about his problems as well. He has been reading the Bible a lot recently and feels like his stronger connection with the Reita Cliche has helped him cope better recently.      Barriers   Psychosocial barriers to participate in program The patient should benefit from training in stress management and relaxation.      Screening Interventions   Interventions Encouraged to exercise    Expected Outcomes Long Term goal: The participant improves quality of Life and PHQ9 Scores as seen by post scores and/or verbalization of changes;Short Term goal: Identification and review with participant of any Quality of Life or Depression concerns found by scoring the questionnaire.           Quality of Life Scores:  Quality of Life - 07/06/20 1425      Quality of Life   Select Quality of Life      Quality of Life Scores   Health/Function Pre 16 %    Socioeconomic Pre 19 %    Psych/Spiritual Pre 23.07 %    Family Pre 15 %    GLOBAL Pre 17.89 %          Scores of 19 and below usually indicate a poorer quality of life in these areas.  A difference of  2-3 points is a clinically meaningful difference.  A difference of 2-3 points in the total score of the Quality of Life Index has been associated with significant improvement in overall quality of life, self-image, physical symptoms, and general health in studies assessing change in quality of life.  PHQ-9: Recent Review Flowsheet Data    Depression screen [facility] 2/9  08/25/2020 07/06/2020 02/22/2020 02/18/2020 02/12/2019   Decreased Interest 0 1  0 0 0   Down, Depressed, Hopeless 1 0 0 0 0   PHQ - 2 Score 1 1 0 0 0   Altered sleeping - 2 - - -   Tired, decreased energy - 2 - - -   Change in appetite - 0 - - -   Feeling bad or failure about yourself  - 0 - - -   Trouble concentrating - 0 - - -   Moving slowly or fidgety/restless - 0 - - -   Suicidal thoughts - 0 - - -   PHQ-9 Score - 5 - - -   Difficult doing work/chores - Somewhat difficult - - -      Interpretation of Total Score  Total Score Depression Severity:  1-4 = Minimal depression, 5-9 = Mild depression, 10-14 = Moderate depression, 15-19 = Moderately severe depression, 20-27 = Severe depression   Psychosocial Evaluation and Intervention:  Psychosocial Evaluation - 07/06/20 1435      Psychosocial Evaluation & Interventions   Interventions Stress management education;Encouraged to exercise with the program and follow exercise prescription    Comments Patient is slightly stressed due to his toleration of Coumadin and his increased breathing. He is hoping that his doctor removes this from his medication list, as his INR keeps improving. He has no other identifiable psychosocial issues.    Expected Outcomes Patient will continue to not have any psychosocial issues.    Continue Psychosocial Services  No Follow up required           Psychosocial Re-Evaluation:  Psychosocial Re-Evaluation    [place] Name 07/13/20 [phone] 08/10/20 0944 09/05/20 1508         Psychosocial Re-Evaluation   Current issues with Current Stress Concerns -- --     Comments Patient is new to the program completing 2 sessions. His current stress concerns are related to his health and taking coumadin. He is managing his stress and it is improving. He demonstrates a positive attitude regading his future. We will continue to monitor. Patient has completed 14 sessions. His cardiologist discontinued his coumadin 2/21 and patient is very happy he does no have to take this medication. He has no psychosocial issues identified. He demonstrates a positive attitude regading his future. We will continue to monitor. Patient has completed 26 sessions. He continues to have no psychosocial issues identified. He demonstrates a positive attitude regading his future and is interested in improving his health. We will continue to monitor.     Expected Outcomes Patient will have no additional psychosocial issues identified and his stress  will be managed or improved. Patient will have no psychosocial issues identified at discharge. Patient will have no psychosocial issues identified at discharge.     Interventions Stress management education;Encouraged to attend Cardiac Rehabilitation for the exercise;Relaxation education Stress management education;Encouraged to attend Cardiac Rehabilitation for the exercise;Relaxation education Stress management education;Encouraged to attend Cardiac Rehabilitation for the exercise;Relaxation education     Continue Psychosocial Services  No Follow up required No Follow up required No Follow up required           Initial Review   Source of Stress Concerns None Identified -- --            Psychosocial Discharge (Final Psychosocial Re-Evaluation):  Psychosocial Re-Evaluation - 09/05/20 1508      Psychosocial Re-Evaluation   Comments Patient has completed 26 sessions. He continues to have no  psychosocial issues identified. He demonstrates a positive attitude regading his future and is interested in improving his health. We will continue to monitor.    Expected Outcomes Patient will have no psychosocial issues identified at discharge.    Interventions Stress management education;Encouraged to attend Cardiac Rehabilitation for the exercise;Relaxation education    Continue Psychosocial Services  No Follow up required           Vocational Rehabilitation: Provide vocational rehab assistance to qualifying candidates.   Vocational Rehab Evaluation & Intervention:  Vocational Rehab - 07/06/20 1310      Initial Vocational Rehab Evaluation & Intervention   Assessment shows need for Vocational Rehabilitation No      Vocational Rehab Re-Evaulation   Comments He is retired and has no interest in returning to work.           Education: Education Goals: Education classes will be provided on a weekly basis, covering required topics. Participant will state understanding/return demonstration of  topics presented.  Learning Barriers/Preferences:  Learning Barriers/Preferences - 07/06/20 1303      Learning Barriers/Preferences   Learning Barriers Hearing    Learning Preferences Skilled Demonstration           Education Topics: Hypertension, Hypertension Reduction -Define heart disease and high blood pressure. Discus how high blood pressure affects the body and ways to reduce high blood pressure.   Exercise and Your Heart -Discuss why it is important to exercise, the FITT principles of exercise, normal and abnormal responses to exercise, and how to exercise safely.   Angina -Discuss definition of angina, causes of angina, treatment of angina, and how to decrease risk of having angina.   Cardiac Medications -Review what the following cardiac medications are used for, how they affect the body, and side effects that may occur when taking the medications.  Medications include Aspirin, Beta blockers, calcium channel blockers, ACE Inhibitors, angiotensin receptor blockers, diuretics, digoxin, and antihyperlipidemics.   Congestive Heart Failure -Discuss the definition of CHF, how to live with CHF, the signs and symptoms of CHF, and how keep track of weight and sodium intake. Flowsheet Row CARDIAC REHAB PHASE II EXERCISE from 09/07/2020 in [place]  Date 07/13/20  Educator [facility]  Instruction Review Code 2- Demonstrated Understanding      Heart Disease and Intimacy -Discus the effect sexual activity has on the heart, how changes occur during intimacy as we age, and safety during sexual activity. Flowsheet Row CARDIAC REHAB PHASE II EXERCISE from 09/07/2020 in [place]  Date 07/20/20  Educator DF  Instruction Review Code 2- Demonstrated Understanding      Smoking Cessation / COPD -Discuss different methods to quit smoking, the health benefits of quitting smoking, and the definition of COPD. Flowsheet Row CARDIAC REHAB PHASE II  EXERCISE from 09/07/2020 in [place]  Date 07/27/20  Educator DF  Instruction Review Code 2- Demonstrated Understanding      Nutrition I: Fats -Discuss the types of cholesterol, what cholesterol does to the heart, and how cholesterol levels can be controlled. Flowsheet Row CARDIAC REHAB PHASE II EXERCISE from 09/07/2020 in [place]  Date 08/03/20  Educator [facility]  Instruction Review Code 2- Demonstrated Understanding      Nutrition II: Labels -Discuss the different components of food labels and how to read food label   Heart Parts/Heart Disease and PAD -Discuss the anatomy of the heart, the pathway of blood circulation through the heart, and these are affected  by heart disease.   Stress I: Signs and Symptoms -Discuss the causes of stress, how stress may lead to anxiety and depression, and ways to limit stress. Flowsheet Row CARDIAC REHAB PHASE II EXERCISE from 09/07/2020 in [place]  Date 08/24/20  Educator [facility]  Instruction Review Code 2- Demonstrated Understanding      Stress II: Relaxation -Discuss different types of relaxation techniques to limit stress. Flowsheet Row CARDIAC REHAB PHASE II EXERCISE from 09/07/2020 in [place]  Date 08/31/20  Educator mk  Instruction Review Code 2- Demonstrated Understanding      Warning Signs of Stroke / TIA -Discuss definition of a stroke, what the signs and symptoms are of a stroke, and how to identify when someone is having stroke. Flowsheet Row CARDIAC REHAB PHASE II EXERCISE from 09/07/2020 in [place]  Date 09/07/20  Educator mk  Instruction Review Code 2- Demonstrated Understanding      Knowledge Questionnaire Score:  Knowledge Questionnaire Score - 07/06/20 1310      Knowledge Questionnaire Score   Pre Score 21/24           Core Components/Risk Factors/Patient Goals at Admission:  Personal Goals  and Risk Factors at Admission - 07/06/20 1304      Core Components/Risk Factors/Patient Goals on Admission    Weight Management Yes;Weight Maintenance    Intervention Weight Management: Develop a combined nutrition and exercise program designed to reach desired caloric intake, while maintaining appropriate intake of nutrient and fiber, sodium and fats, and appropriate energy expenditure required for the weight goal.;Weight Management: Provide education and appropriate resources to help participant work on and attain dietary goals.;Weight Management/Obesity: Establish reasonable short term and long term weight goals.;Obesity: Provide education and appropriate resources to help participant work on and attain dietary goals.    Expected Outcomes Short Term: Continue to assess and modify interventions until short term weight is achieved;Long Term: Adherence to nutrition and physical activity/exercise program aimed toward attainment of established weight goal;Weight Maintenance: Understanding of the daily nutrition guidelines, which includes 25-35% calories from fat, 7% or less cal from saturated fats, less than 247m cholesterol, less than 1.5gm of sodium, & 5 or more servings of fruits and vegetables daily;Weight Loss: Understanding of general recommendations for a balanced deficit meal plan, which promotes 1-2 lb weight loss per week and includes a negative energy balance of [phone] kcal/d;Understanding recommendations for meals to include 15-35% energy as protein, 25-35% energy from fat, 35-60% energy from carbohydrates, less than 2069mof dietary cholesterol, 20-35 gm of total fiber daily;Understanding of distribution of calorie intake throughout the day with the consumption of 4-5 meals/snacks;Weight Gain: Understanding of general recommendations for a high calorie, high protein meal plan that promotes weight gain by distributing calorie intake throughout the day with the consumption for 4-5 meals, snacks,  and/or supplements    Improve shortness of breath with ADL's Yes    Intervention Provide education, individualized exercise plan and daily activity instruction to help decrease symptoms of SOB with activities of daily living.    Expected Outcomes Short Term: Improve cardiorespiratory fitness to achieve a reduction of symptoms when performing ADLs;Long Term: Be able to perform more ADLs without symptoms or delay the onset of symptoms    Lipids Yes    Intervention Provide education and support for participant on nutrition & aerobic/resistive exercise along with prescribed medications to achieve LDL <7038mHDL >75m36m  Expected Outcomes Short Term: Participant states understanding of desired  cholesterol values and is compliant with medications prescribed. Participant is following exercise prescription and nutrition guidelines.;Long Term: Cholesterol controlled with medications as prescribed, with individualized exercise RX and with personalized nutrition plan. Value goals: LDL < 98m, HDL > 40 mg.    Stress Yes    Intervention Offer individual and/or small group education and counseling on adjustment to heart disease, stress management and health-related lifestyle change. Teach and support self-help strategies.;Refer participants experiencing significant psychosocial distress to appropriate mental health specialists for further evaluation and treatment. When possible, include family members and significant others in education/counseling sessions.    Expected Outcomes Short Term: Participant demonstrates changes in health-related behavior, relaxation and other stress management skills, ability to obtain effective social support, and compliance with psychotropic medications if prescribed.;Long Term: Emotional wellbeing is indicated by absence of clinically significant psychosocial distress or social isolation.    Personal Goal Other Yes    Personal Goal He would like to play golf and fish again.     Intervention Attend cardiac rehab and continue with his home exercise program.    Expected Outcomes He will reach a MET level through exercise that allows him to golf again.           Core Components/Risk Factors/Patient Goals Review:   Goals and Risk Factor Review    Row Name 07/13/20 0840 08/10/20 0946 09/05/20 1509         Core Components/Risk Factors/Patient Goals Review   Personal Goals Review Other Other Other     Review Patient is new to the program completing 2 sessions. 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 He has some risk factors for CAD and is participating in the program for risk modication. His personal goal for the program are to play golf again and fish again. We will continue to monitor as he works toward mBest Buythese goals. Patient has completed 14 sessions gaining 1 lb since his last 30 day review. He is doing well in the program with progressions and consistent attendance. He saw his cardiologist 2/21 for routine check. Since patient in NS[facility], Dr. HPercival Spanishdiscontinued Cardiazem and coumadin and added ASA 81 mg daily and increased his metoprolol to 50 mg bid. Patient says he is feeling stronger and feels the program is helping him. His personal goals for the program are to play golf and fish again. We will continue to monitor as he works toward meeting his goals. Patient has completed 26 sessions losing 3 lbs since last 30 day review. He continues to do well in the program with consistent attendance and progression. He saw an endocrinologist 3/17 and reported to the MD that his stamina and appetite had improved with the program. MD added Vit D supplement for low Vit D and his LDL had increased. He is trying Repatha due to his myalgia with statins. His blood pressure is well controlled. His personal goals for the program are to play golf and fish again. We will continue to monitor for progress as he works toward meeting these goals.     Expected Outcomes Patient will  complete the program meeting both personal and program goals. Patient will complete the program meeting both personal and program goals. Patient will complete the program meeting both personal and program goals.            Core Components/Risk Factors/Patient Goals at Discharge (Final Review):   Goals and Risk Factor Review - 09/05/20 1509      Core Components/Risk Factors/Patient Goals Review  Personal Goals Review Other    Review Patient has completed 26 sessions losing 3 lbs since last 30 day review. He continues to do well in the program with consistent attendance and progression. He saw an endocrinologist 3/17 and reported to the MD that his stamina and appetite had improved with the program. MD added Vit D supplement for low Vit D and his LDL had increased. He is trying Repatha due to his myalgia with statins. His blood pressure is well controlled. His personal goals for the program are to play golf and fish again. We will continue to monitor for progress as he works toward meeting these goals.    Expected Outcomes Patient will complete the program meeting both personal and program goals.           ITP Comments:   Comments: ITP REVIEW Pt is making expected progress toward Cardiac Rehab goals after completing 29 sessions. Recommend continued exercise, life style modification, education, and increased stamina and strength.

## 2020-09-16 ENCOUNTER — Encounter (HOSPITAL_COMMUNITY)
Admission: RE | Admit: 2020-09-16 | Discharge: 2020-09-16 | Disposition: A | Discharge status: Home / Self Care | Payer: Medicare Other | Source: Ambulatory Visit | Attending: Cardiology | Admitting: Cardiology

## 2020-09-16 ENCOUNTER — Other Ambulatory Visit: Payer: Self-pay

## 2020-09-16 DIAGNOSIS — Z951 Presence of aortocoronary bypass graft: Secondary | ICD-10-CM

## 2020-09-16 NOTE — Progress Notes (Signed)
Daily Session Note  Patient Details  Name: Guy Morris MRN: [id number] Date of Birth: [birth year] Referring Provider:   Flowsheet Row CARDIAC REHAB PHASE II ORIENTATION from 07/06/2020 in [place]  Referring Provider [name]      Encounter Date: 09/16/2020  Check In:  Session Check In - 09/16/20 0815      Check-In   Supervising physician immediately available to respond to emergencies [facility] MD immediately available    Physician(s) Dr. Domenic Polite    Location [facility]    Staff Present Aundra Dubin, RN, Bjorn Loser, MS, ACSM-CEP, Exercise Physiologist    Virtual Visit No    Medication changes reported     No    Fall or balance concerns reported    No    Tobacco Cessation No Change    Warm-up and Cool-down Performed as group-led instruction    Resistance Training Performed Yes    VAD Patient? No    PAD/SET Patient? No      Pain Assessment   Currently in Pain? No/denies    Multiple Pain Sites No           Capillary Blood Glucose: No results found for this or any previous visit (from the past 24 hour(s)).    Social History   Tobacco Use  Smoking Status Former Smoker  . Packs/day: 2.00  . Years: 37.00  . Pack years: 74.00  . Quit date: 06/11/2000  . Years since quitting: 20.2  Smokeless Tobacco Never Used  Tobacco Comment   quit smoking cigarettes 2002 and quit cigars 2016    Goals Met:  Independence with exercise equipment Exercise tolerated well No report of cardiac concerns or symptoms Strength training completed today  Goals Unmet:  Not Applicable  Comments: checkout time is 0915   Dr. Kathie Dike is Medical Director for [facility] Pulmonary Rehab.

## 2020-09-19 ENCOUNTER — Encounter (HOSPITAL_COMMUNITY)
Admission: RE | Admit: 2020-09-19 | Discharge: 2020-09-19 | Disposition: A | Discharge status: Home / Self Care | Payer: Medicare Other | Source: Ambulatory Visit | Attending: Cardiology | Admitting: Cardiology

## 2020-09-19 ENCOUNTER — Other Ambulatory Visit: Payer: Self-pay

## 2020-09-19 DIAGNOSIS — Z951 Presence of aortocoronary bypass graft: Secondary | ICD-10-CM | POA: Diagnosis not present

## 2020-09-19 NOTE — Progress Notes (Signed)
Daily Session Note  Patient Details  Name: Guy Morris MRN: [id number] Date of Birth: [birth year] Referring Provider:   Flowsheet Row CARDIAC REHAB PHASE II ORIENTATION from 07/06/2020 in [place]  Referring Provider [name]      Encounter Date: 09/19/2020  Check In:  Session Check In - 09/19/20 0815      Check-In   Supervising physician immediately available to respond to emergencies [facility] MD immediately available    Physician(s) Dr. Melene Muller    Location [facility]    Staff Present Cathren Harsh, MS, Exercise Physiologist;Dalton Kris Mouton, MS, ACSM-CEP, Exercise Physiologist    Virtual Visit No    Medication changes reported     No    Fall or balance concerns reported    No    Tobacco Cessation No Change    Warm-up and Cool-down Performed as group-led instruction    Resistance Training Performed Yes    VAD Patient? No    PAD/SET Patient? No      Pain Assessment   Currently in Pain? No/denies    Multiple Pain Sites No           Capillary Blood Glucose: No results found for this or any previous visit (from the past 24 hour(s)).    Social History   Tobacco Use  Smoking Status Former Smoker  . Packs/day: 2.00  . Years: 37.00  . Pack years: 74.00  . Quit date: 06/11/2000  . Years since quitting: 20.2  Smokeless Tobacco Never Used  Tobacco Comment   quit smoking cigarettes 2002 and quit cigars 2016    Goals Met:  Independence with exercise equipment Exercise tolerated well No report of cardiac concerns or symptoms Strength training completed today  Goals Unmet:  Not Applicable  Comments: check out 0915   Dr. Kathie Dike is Medical Director for [facility] Pulmonary Rehab.

## 2020-09-21 ENCOUNTER — Other Ambulatory Visit: Payer: Self-pay

## 2020-09-21 ENCOUNTER — Encounter (HOSPITAL_COMMUNITY)
Admission: RE | Admit: 2020-09-21 | Discharge: 2020-09-21 | Disposition: A | Discharge status: Home / Self Care | Payer: Medicare Other | Source: Ambulatory Visit | Attending: Cardiology | Admitting: Cardiology

## 2020-09-21 DIAGNOSIS — Z951 Presence of aortocoronary bypass graft: Secondary | ICD-10-CM

## 2020-09-21 NOTE — Progress Notes (Signed)
Daily Session Note  Patient Details  Name: Guy Morris MRN: [id number] Date of Birth: 11-23-1950 Referring Provider:   Flowsheet Row CARDIAC REHAB PHASE II ORIENTATION from 07/06/2020 in [place]  Referring Provider [name]      Encounter Date: 09/21/2020  Check In:  Session Check In - 09/21/20 0815      Check-In   Supervising physician immediately available to respond to emergencies [facility] MD immediately available    Physician(s) Dr. Melene Muller    Location [facility]    Staff Present Cathren Harsh, MS, Exercise Physiologist;Dalton Kris Mouton, MS, ACSM-CEP, Exercise Physiologist    Virtual Visit No    Medication changes reported     No    Fall or balance concerns reported    No    Tobacco Cessation No Change    Warm-up and Cool-down Performed as group-led instruction    Resistance Training Performed Yes    VAD Patient? No    PAD/SET Patient? No      Pain Assessment   Currently in Pain? No/denies    Multiple Pain Sites No           Capillary Blood Glucose: No results found for this or any previous visit (from the past 24 hour(s)).    Social History   Tobacco Use  Smoking Status Former Smoker  . Packs/day: 2.00  . Years: 37.00  . Pack years: 74.00  . Quit date: 06/11/2000  . Years since quitting: 20.2  Smokeless Tobacco Never Used  Tobacco Comment   quit smoking cigarettes 2002 and quit cigars 2016    Goals Met:  Independence with exercise equipment Exercise tolerated well No report of cardiac concerns or symptoms Strength training completed today  Goals Unmet:  Not Applicable  Comments: checkout 0915   Dr. Kathie Dike is Medical Director for [facility] Pul[facility] Rehab.

## 2020-09-23 ENCOUNTER — Encounter (HOSPITAL_COMMUNITY)
Admission: RE | Admit: 2020-09-23 | Discharge: 2020-09-23 | Disposition: A | Discharge status: Home / Self Care | Payer: Medicare Other | Source: Ambulatory Visit | Attending: Cardiology | Admitting: Cardiology

## 2020-09-23 ENCOUNTER — Other Ambulatory Visit: Payer: Self-pay

## 2020-09-23 DIAGNOSIS — Z951 Presence of aortocoronary bypass graft: Secondary | ICD-10-CM

## 2020-09-23 NOTE — Progress Notes (Signed)
Daily Session Note  Patient Details  Name: Guy Morris MRN: [id number] Date of Birth: [birth year] Referring Provider:   Flowsheet Row CARDIAC REHAB PHASE II ORIENTATION from 07/06/2020 in [place]  Referring Provider [name]      Encounter Date: 09/23/2020  Check In:  Session Check In - 09/23/20 0815      Check-In   Supervising physician immediately available to respond to emergencies [facility] MD immediately available    Physician(s) Dr. Harrington Challenger    Location [facility]    Staff Present Cathren Harsh, MS, Exercise Physiologist;Dalton Kris Mouton, MS, ACSM-CEP, Exercise Physiologist    Virtual Visit No    Medication changes reported     No    Fall or balance concerns reported    No    Tobacco Cessation No Change    Warm-up and Cool-down Performed as group-led instruction    Resistance Training Performed Yes    VAD Patient? No    PAD/SET Patient? No      Pain Assessment   Currently in Pain? No/denies    Multiple Pain Sites No           Capillary Blood Glucose: No results found for this or any previous visit (from the past 24 hour(s)).    Social History   Tobacco Use  Smoking Status Former Smoker  . Packs/day: 2.00  . Years: 37.00  . Pack years: 74.00  . Quit date: 06/11/2000  . Years since quitting: 20.2  Smokeless Tobacco Never Used  Tobacco Comment   quit smoking cigarettes 2002 and quit cigars 2016    Goals Met:  Independence with exercise equipment Exercise tolerated well No report of cardiac concerns or symptoms Strength training completed today  Goals Unmet:  Not Applicable  Comments: check out 0915   Dr. Kathie Dike is Medical Director for [facility].

## 2020-09-26 ENCOUNTER — Encounter (HOSPITAL_COMMUNITY)
Admission: RE | Admit: 2020-09-26 | Discharge: 2020-09-26 | Disposition: A | Discharge status: Home / Self Care | Payer: Medicare Other | Source: Ambulatory Visit | Attending: Cardiology | Admitting: Cardiology

## 2020-09-26 ENCOUNTER — Other Ambulatory Visit: Payer: Self-pay

## 2020-09-26 VITALS — Wt 174.8 lb

## 2020-09-26 DIAGNOSIS — Z951 Presence of aortocoronary bypass graft: Secondary | ICD-10-CM

## 2020-09-26 NOTE — Progress Notes (Signed)
Daily Session Note  Patient Details  Name: Guy Morris MRN: [id number] Date of Birth: 02/26/1951 Referring Provider:   Flowsheet Row CARDIAC REHAB PHASE II ORIENTATION from 07/06/2020 in [place]  Referring Provider [name]      Encounter Date: 09/26/2020  Check In:  Session Check In - 09/26/20 0815      Check-In   Supervising physician immediately available to respond to emergencies [facility] MD immediately available    Physician(s) Dr. Harl Bowie    Location [facility]    Staff Present Cathren Harsh, MS, Exercise Physiologist;Zoua Caporaso Kris Mouton, MS, ACSM-CEP, Exercise Physiologist    Virtual Visit No    Medication changes reported     No    Fall or balance concerns reported    No    Tobacco Cessation No Change    Warm-up and Cool-down Performed as group-led instruction    Resistance Training Performed Yes    VAD Patient? No    PAD/SET Patient? No      Pain Assessment   Currently in Pain? No/denies    Multiple Pain Sites No           Capillary Blood Glucose: No results found for this or any previous visit (from the past 24 hour(s)).    Social History   Tobacco Use  Smoking Status Former Smoker  . Packs/day: 2.00  . Years: 37.00  . Pack years: 74.00  . Quit date: 06/11/2000  . Years since quitting: 20.3  Smokeless Tobacco Never Used  Tobacco Comment   quit smoking cigarettes 2002 and quit cigars 2016    Goals Met:  Independence with exercise equipment Exercise tolerated well No report of cardiac concerns or symptoms Strength training completed today  Goals Unmet:  Not Applicable  Comments: checkout time is 0915   Dr. Kathie Dike is Medical Director for [facility].

## 2020-09-28 ENCOUNTER — Encounter (HOSPITAL_COMMUNITY)
Admission: RE | Admit: 2020-09-28 | Discharge: 2020-09-28 | Disposition: A | Discharge status: Home / Self Care | Payer: Medicare Other | Source: Ambulatory Visit | Attending: Cardiology | Admitting: Cardiology

## 2020-09-28 ENCOUNTER — Other Ambulatory Visit: Payer: Self-pay

## 2020-09-28 VITALS — Ht 68.0 in | Wt 175.5 lb

## 2020-09-28 DIAGNOSIS — Z951 Presence of aortocoronary bypass graft: Secondary | ICD-10-CM

## 2020-09-28 NOTE — Progress Notes (Signed)
Daily Session Note  Patient Details  Name: Guy Morris MRN: [id number] Date of Birth: 1950-10-10 Referring Provider:   Flowsheet Row CARDIAC REHAB PHASE II ORIENTATION from 07/06/2020 in [place]  Referring Provider [name]      Encounter Date: 09/28/2020  Check In:  Session Check In - 09/28/20 0815      Check-In   Supervising physician immediately available to respond to emergencies [facility] MD immediately available    Physician(s) Dr. Harl Bowie    Location [facility]    Staff Present Cathren Harsh, MS, Exercise Physiologist;Dalton Kris Mouton, MS, ACSM-CEP, Exercise Physiologist    Virtual Visit No    Medication changes reported     No    Fall or balance concerns reported    No    Tobacco Cessation No Change    Warm-up and Cool-down Performed as group-led instruction    Resistance Training Performed Yes    VAD Patient? No    PAD/SET Patient? No      Pain Assessment   Currently in Pain? No/denies    Multiple Pain Sites No           Capillary Blood Glucose: No results found for this or any previous visit (from the past 24 hour(s)).    Social History   Tobacco Use  Smoking Status Former Smoker  . Packs/day: 2.00  . Years: 37.00  . Pack years: 74.00  . Quit date: 06/11/2000  . Years since quitting: 20.3  Smokeless Tobacco Never Used  Tobacco Comment   quit smoking cigarettes 2002 and quit cigars 2016    Goals Met:  Independence with exercise equipment Exercise tolerated well No report of cardiac concerns or symptoms Strength training completed today  Goals Unmet:  Not Applicable  Comments: check out 0915   Dr. Kathie Dike is Medical Director for [facility].

## 2020-09-30 ENCOUNTER — Encounter (HOSPITAL_COMMUNITY): Payer: Medicare Other
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
## 2020-10-06 ENCOUNTER — Ambulatory Visit (INDEPENDENT_AMBULATORY_CARE_PROVIDER_SITE_OTHER): Payer: Medicare Other | Admitting: Cardiology

## 2020-10-06 ENCOUNTER — Other Ambulatory Visit: Payer: Self-pay

## 2020-10-06 ENCOUNTER — Encounter: Payer: Self-pay | Admitting: Internal Medicine

## 2020-10-06 ENCOUNTER — Other Ambulatory Visit (INDEPENDENT_AMBULATORY_CARE_PROVIDER_SITE_OTHER): Payer: Medicare Other

## 2020-10-06 ENCOUNTER — Encounter: Payer: Self-pay | Admitting: Cardiology

## 2020-10-06 VITALS — BP 130/75 | HR 82 | Ht 67.0 in | Wt 176.0 lb

## 2020-10-06 DIAGNOSIS — I6523 Occlusion and stenosis of bilateral carotid arteries: Secondary | ICD-10-CM | POA: Diagnosis not present

## 2020-10-06 DIAGNOSIS — I480 Paroxysmal atrial fibrillation: Secondary | ICD-10-CM | POA: Diagnosis not present

## 2020-10-06 DIAGNOSIS — E785 Hyperlipidemia, unspecified: Secondary | ICD-10-CM

## 2020-10-06 DIAGNOSIS — E039 Hypothyroidism, unspecified: Secondary | ICD-10-CM | POA: Diagnosis not present

## 2020-10-06 DIAGNOSIS — I2510 Atherosclerotic heart disease of native coronary artery without angina pectoris: Secondary | ICD-10-CM

## 2020-10-06 LAB — T4, FREE: Free T4: 1.01 ng/dL (ref 0.60–1.60)

## 2020-10-06 LAB — TSH: TSH: 2.86 u[IU]/mL (ref 0.35–4.50)

## 2020-10-06 NOTE — Addendum Note (Signed)
Addended by: Vennie Homans on: 10/06/2020 10:15 AM   Modules accepted: Orders

## 2020-10-06 NOTE — Patient Instructions (Signed)
Medication Instructions:  Continue current medications  *If you need a refill on your cardiac medications before your next appointment, please call your pharmacy*   Lab Work: None ordered   Testing/Procedures: None Ordered   Follow-Up: At [facility], you and your health needs are our priority.  As part of our continuing mission to provide you with exceptional heart care, we have created designated Provider Care Teams.  These Care Teams include your primary Cardiologist (physician) and Advanced Practice Providers (APPs -  Physician Assistants and Nurse Practitioners) who all work together to provide you with the care you need, when you need it.  We recommend signing up for the patient portal called "MyChart".  Sign up information is provided on this After Visit Summary.  MyChart is used to connect with patients for Virtual Visits (Telemedicine).  Patients are able to view lab/test results, encounter notes, upcoming appointments, etc.  Non-urgent messages can be sent to your provider as well.   To learn more about what you can do with MyChart, go to [email or web address]://[email or web address].com.    Your next appointment:   1 year(s)  The format for your next appointment:   In Person  Provider:   You may see James Hochrein, MD or one of the following Advanced Practice Providers on your designated Care Team:    Rhonda Barrett, PA-C  Kathryn Lawrence, DNP, ANP     

## 2020-10-11 DIAGNOSIS — J019 Acute sinusitis, unspecified: Secondary | ICD-10-CM | POA: Diagnosis not present

## 2020-10-11 DIAGNOSIS — J209 Acute bronchitis, unspecified: Secondary | ICD-10-CM | POA: Diagnosis not present

## 2020-10-11 DIAGNOSIS — U071 COVID-19: Secondary | ICD-10-CM | POA: Diagnosis not present

## 2020-10-24 NOTE — Progress Notes (Signed)
Discharge Progress Report  Patient Details  Name: Guy Morris MRN: [id number] Date of Birth: July 30, 1950 Referring Provider:   Flowsheet Row CARDIAC REHAB PHASE II ORIENTATION from 07/06/2020 in [place]  Referring Provider [name]       Number of Visits: 36  Reason for Discharge:  Patient reached a stable level of exercise. Patient independent in their exercise. Patient has met program and personal goals.  Smoking History:  Social History   Tobacco Use  Smoking Status Former Smoker  . Packs/day: 2.00  . Years: 37.00  . Pack years: 74.00  . Quit date: 06/11/2000  . Years since quitting: 20.3  Smokeless Tobacco Never Used  Tobacco Comment   quit smoking cigarettes 2002 and quit cigars 2016    Diagnosis:  S/P CABG x 4  ADL [facility]SD:   Initial Exercise Prescription:  Initial Exercise Prescription - 07/06/20 1400      Date of Initial Exercise RX and Referring Provider   Date 07/06/20    Referring Provider [name]    Expected Discharge Date 09/30/20      Recumbant Elliptical   Level 1    RPM 60    Minutes 39      Intensity   THRR 40-80% of Max Heartrate 61-122    Ratings of Perceived Exertion 11-13    Perceived Dyspnea 0-4      Resistance Training   Training Prescription Yes    Weight 3    Reps 10-15           Discharge Exercise Prescription (Final Exercise Prescription Changes):  Exercise Prescription Changes - 09/26/20 1000      Response to Exercise   Blood Pressure (Admit) 116/68    Blood Pressure (Exercise) 134/84    Blood Pressure (Exit) 112/72    Heart Rate (Admit) 63 bpm    Heart Rate (Exercise) 88 bpm    Heart Rate (Exit) 70 bpm    Rating of Perceived Exertion (Exercise) 11    Duration Continue with 30 min of aerobic exercise without signs/symptoms of physical distress.    Intensity THRR unchanged      Progression   Progression Continue to progress workloads to maintain intensity without signs/symptoms  of physical distress.      Resistance Training   Training Prescription Yes    Weight 5 lbs    Reps 10-15    Time 10 Minutes      Arm Ergometer   Level 3    Minutes 22    METs 4.9      Recumbant Elliptical   Level 3    RPM 72    Minutes 17    METs 5.5           Functional Capacity:  6 Minute Walk    Row Name 07/06/20 1421 09/28/20 0844       6 Minute Walk   Phase Initial Discharge    Distance 1300 feet 1400 feet    Walk Time 6 minutes 6 minutes    # of Rest Breaks 0 0    MPH 2.5 2.7    METS 2.99 2.88    RPE 12 11    VO2 Peak 10.49 10.09    Symptoms Yes (comment) No    Comments SOB --    Resting HR 68 bpm 63 bpm    Resting BP 118/74 110/66    Resting Oxygen Saturation  97 % 98 %    Exercise Oxygen Saturation  during  6 min walk 97 % 100 %    Max Ex. HR 83 bpm 67 bpm    Max Ex. BP 152/76 122/66    2 Minute Post BP 128/76 98/60           Psychological, QOL, Others - Outcomes: PHQ 2/9: Depression screen [facility] 2/9 10/24/2020 08/25/2020 07/06/2020 02/22/2020 02/18/2020  Decreased Interest 0 0 1 0 0  Down, Depressed, Hopeless 0 1 0 0 0  PHQ - 2 Score 0 1 1 0 0  Altered sleeping 1 - 2 - -  Tired, decreased energy 2 - 2 - -  Change in appetite 0 - 0 - -  Feeling bad or failure about yourself  0 - 0 - -  Trouble concentrating 0 - 0 - -  Moving slowly or fidgety/restless 0 - 0 - -  Suicidal thoughts 0 - 0 - -  PHQ-9 Score 3 - 5 - -  Difficult doing work/chores Somewhat difficult - Somewhat difficult - -    Quality of Life:  Quality of Life - 10/24/20 1327      Quality of Life   Select Quality of Life      Quality of Life Scores   Health/Function Pre 16 %    Health/Function Post 23.2 %    Health/Function % Change 45 %    Socioeconomic Pre 19 %    Socioeconomic Post 23 %    Socioeconomic % Change  21.05 %    Psych/Spiritual Pre 23.07 %    Psych/Spiritual Post 28.29 %    Psych/Spiritual % Change 22.63 %    Family Pre 15 %    Family Post 22.8 %    Family %  Change 52 %    GLOBAL Pre 17.89 %    GLOBAL Post 24.18 %    GLOBAL % Change 35.16 %           Personal Goals: Goals established at orientation with interventions provided to work toward goal.  Personal Goals and Risk Factors at Admission - 07/06/20 1304      Core Components/Risk Factors/Patient Goals on Admission    Weight Management Yes;Weight Maintenance    Intervention Weight Management: Develop a combined nutrition and exercise program designed to reach desired caloric intake, while maintaining appropriate intake of nutrient and fiber, sodium and fats, and appropriate energy expenditure required for the weight goal.;Weight Management: Provide education and appropriate resources to help participant work on and attain dietary goals.;Weight Management/Obesity: Establish reasonable short term and long term weight goals.;Obesity: Provide education and appropriate resources to help participant work on and attain dietary goals.    Expected Outcomes Short Term: Continue to assess and modify interventions until short term weight is achieved;Long Term: Adherence to nutrition and physical activity/exercise program aimed toward attainment of established weight goal;Weight Maintenance: Understanding of the daily nutrition guidelines, which includes 25-35% calories from fat, 7% or less cal from saturated fats, less than 261m cholesterol, less than 1.5gm of sodium, & 5 or more servings of fruits and vegetables daily;Weight Loss: Understanding of general recommendations for a balanced deficit meal plan, which promotes 1-2 lb weight loss per week and includes a negative energy balance of [phone] kcal/d;Understanding recommendations for meals to include 15-35% energy as protein, 25-35% energy from fat, 35-60% energy from carbohydrates, less than 2043mof dietary cholesterol, 20-35 gm of total fiber daily;Understanding of distribution of calorie intake throughout the day with the consumption of 4-5  meals/snacks;Weight Gain: Understanding of general recommendations for a  high calorie, high protein meal plan that promotes weight gain by distributing calorie intake throughout the day with the consumption for 4-5 meals, snacks, and/or supplements    Improve shortness of breath with ADL's Yes    Intervention Provide education, individualized exercise plan and daily activity instruction to help decrease symptoms of SOB with activities of daily living.    Expected Outcomes Short Term: Improve cardiorespiratory fitness to achieve a reduction of symptoms when performing ADLs;Long Term: Be able to perform more ADLs without symptoms or delay the onset of symptoms    Lipids Yes    Intervention Provide education and support for participant on nutrition & aerobic/resistive exercise along with prescribed medications to achieve LDL <21m, HDL >474m    Expected Outcomes Short Term: Participant states understanding of desired cholesterol values and is compliant with medications prescribed. Participant is following exercise prescription and nutrition guidelines.;Long Term: Cholesterol controlled with medications as prescribed, with individualized exercise RX and with personalized nutrition plan. Value goals: LDL < 7070mHDL > 40 mg.    Stress Yes    Intervention Offer individual and/or small group education and counseling on adjustment to heart disease, stress management and health-related lifestyle change. Teach and support self-help strategies.;Refer participants experiencing significant psychosocial distress to appropriate mental health specialists for further evaluation and treatment. When possible, include family members and significant others in education/counseling sessions.    Expected Outcomes Short Term: Participant demonstrates changes in health-related behavior, relaxation and other stress management skills, ability to obtain effective social support, and compliance with psychotropic medications if  prescribed.;Long Term: Emotional wellbeing is indicated by absence of clinically significant psychosocial distress or social isolation.    Personal Goal Other Yes    Personal Goal He would like to play golf and fish again.    Intervention Attend cardiac rehab and continue with his home exercise program.    Expected Outcomes He will reach a MET level through exercise that allows him to golf again.            Personal Goals Discharge:  Goals and Risk Factor Review    Row Name 07/13/20 0840 08/10/20 0946 09/05/20 1509 10/24/20 1331       Core Components/Risk Factors/Patient Goals Review   Personal Goals Review Other Other Other Other    Review Patient is new to the program completing 2 sessions. 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 He has some risk factors for CAD and is participating in the program for risk modication. His personal goal for the program are to play golf again and fish again. We will continue to monitor as he works toward [employer] goals. Patient has completed 14 sessions gaining 1 lb since his last 30 day review. He is doing well in the program with progressions and consistent attendance. He saw his cardiologist 2/21 for routine check. Since patient in [facility], Dr. HocPercival Spanishscontinued Cardiazem and coumadin and added ASA 81 mg daily and increased his metoprolol to 50 mg bid. Patient says he is feeling stronger and feels the program is helping him. His personal goals for the program are to play golf and fish again. We will continue to monitor as he works toward meeting his goals. Patient has completed 26 sessions losing 3 lbs since last 30 day review. He continues to do well in the program with consistent attendance and progression. He saw an endocrinologist 3/17 and reported to the MD that his stamina and appetite had improved with the program. MD added Vit  D supplement for low Vit D and his LDL had increased. He is trying Repatha due to his myalgia with statins. His  blood pressure is well controlled. His personal goals for the program are to play golf and fish again. We will continue to monitor for progress as he works toward meeting these goals. Patient had completed 36 sessions before discharge. He had very consistent attendance and he was progressing very well. His knee limited him on his progression a little bit, but overall did very well. He has gained over 4 lbs since entering the program. He says that he has improved greatly since being in the program. He has met his personal goals and is going to continue to work towards meeting more goals.    Expected Outcomes Patient will complete the program meeting both personal and program goals. Patient will complete the program meeting both personal and program goals. Patient will complete the program meeting both personal and program goals. --           Exercise Goals and Review:  Exercise Goals    Row Name 07/06/20 1424 07/18/20 1031 08/15/20 0910 09/12/20 1040       Exercise Goals   Increase Physical Activity Yes Yes Yes Yes    Intervention Provide advice, education, support and counseling about physical activity/exercise needs.;Develop an individualized exercise prescription for aerobic and resistive training based on initial evaluation findings, risk stratification, comorbidities and participant's personal goals. Provide advice, education, support and counseling about physical activity/exercise needs.;Develop an individualized exercise prescription for aerobic and resistive training based on initial evaluation findings, risk stratification, comorbidities and participant's personal goals. Provide advice, education, support and counseling about physical activity/exercise needs.;Develop an individualized exercise prescription for aerobic and resistive training based on initial evaluation findings, risk stratification, comorbidities and participant's personal goals. Provide advice, education, support and counseling  about physical activity/exercise needs.;Develop an individualized exercise prescription for aerobic and resistive training based on initial evaluation findings, risk stratification, comorbidities and participant's personal goals.    Expected Outcomes Short Term: Attend rehab on a regular basis to increase amount of physical activity.;Long Term: Add in home exercise to make exercise part of routine and to increase amount of physical activity.;Long Term: Exercising regularly at least 3-5 days a week. Short Term: Attend rehab on a regular basis to increase amount of physical activity.;Long Term: Add in home exercise to make exercise part of routine and to increase amount of physical activity.;Long Term: Exercising regularly at least 3-5 days a week. Short Term: Attend rehab on a regular basis to increase amount of physical activity.;Long Term: Add in home exercise to make exercise part of routine and to increase amount of physical activity.;Long Term: Exercising regularly at least 3-5 days a week. Short Term: Attend rehab on a regular basis to increase amount of physical activity.;Long Term: Add in home exercise to make exercise part of routine and to increase amount of physical activity.;Long Term: Exercising regularly at least 3-5 days a week.    Increase Strength and Stamina Yes Yes Yes Yes    Intervention Provide advice, education, support and counseling about physical activity/exercise needs.;Develop an individualized exercise prescription for aerobic and resistive training based on initial evaluation findings, risk stratification, comorbidities and participant's personal goals. Provide advice, education, support and counseling about physical activity/exercise needs.;Develop an individualized exercise prescription for aerobic and resistive training based on initial evaluation findings, risk stratification, comorbidities and participant's personal goals. Provide advice, education, support and counseling about  physical activity/exercise needs.;Develop an  individualized exercise prescription for aerobic and resistive training based on initial evaluation findings, risk stratification, comorbidities and participant's personal goals. Provide advice, education, support and counseling about physical activity/exercise needs.;Develop an individualized exercise prescription for aerobic and resistive training based on initial evaluation findings, risk stratification, comorbidities and participant's personal goals.    Expected Outcomes Short Term: Increase workloads from initial exercise prescription for resistance, speed, and METs.;Short Term: Perform resistance training exercises routinely during rehab and add in resistance training at home;Long Term: Improve cardiorespiratory fitness, muscular endurance and strength as measured by increased METs and functional capacity (6MWT) Short Term: Increase workloads from initial exercise prescription for resistance, speed, and METs.;Short Term: Perform resistance training exercises routinely during rehab and add in resistance training at home;Long Term: Improve cardiorespiratory fitness, muscular endurance and strength as measured by increased METs and functional capacity (6MWT) Short Term: Increase workloads from initial exercise prescription for resistance, speed, and METs.;Short Term: Perform resistance training exercises routinely during rehab and add in resistance training at home;Long Term: Improve cardiorespiratory fitness, muscular endurance and strength as measured by increased METs and functional capacity (6MWT) Short Term: Increase workloads from initial exercise prescription for resistance, speed, and METs.;Short Term: Perform resistance training exercises routinely during rehab and add in resistance training at home;Long Term: Improve cardiorespiratory fitness, muscular endurance and strength as measured by increased METs and functional capacity (6MWT)    Able to understand  and use rate of perceived exertion (RPE) scale Yes Yes Yes Yes    Intervention Provide education and explanation on how to use RPE scale Provide education and explanation on how to use RPE scale Provide education and explanation on how to use RPE scale Provide education and explanation on how to use RPE scale    Expected Outcomes Short Term: Able to use RPE daily in rehab to express subjective intensity level;Long Term:  Able to use RPE to guide intensity level when exercising independently Short Term: Able to use RPE daily in rehab to express subjective intensity level;Long Term:  Able to use RPE to guide intensity level when exercising independently Short Term: Able to use RPE daily in rehab to express subjective intensity level;Long Term:  Able to use RPE to guide intensity level when exercising independently Short Term: Able to use RPE daily in rehab to express subjective intensity level;Long Term:  Able to use RPE to guide intensity level when exercising independently    Knowledge and understanding of Target Heart Rate Range (THRR) Yes Yes Yes Yes    Intervention Provide education and explanation of THRR including how the numbers were predicted and where they are located for reference Provide education and explanation of THRR including how the numbers were predicted and where they are located for reference Provide education and explanation of THRR including how the numbers were predicted and where they are located for reference Provide education and explanation of THRR including how the numbers were predicted and where they are located for reference    Expected Outcomes Short Term: Able to state/look up THRR;Long Term: Able to use THRR to govern intensity when exercising independently;Short Term: Able to use daily as guideline for intensity in rehab Short Term: Able to state/look up THRR;Long Term: Able to use THRR to govern intensity when exercising independently;Short Term: Able to use daily as guideline  for intensity in rehab Short Term: Able to state/look up THRR;Long Term: Able to use THRR to govern intensity when exercising independently;Short Term: Able to use daily as guideline for intensity in  rehab Short Term: Able to state/look up THRR;Long Term: Able to use THRR to govern intensity when exercising independently;Short Term: Able to use daily as guideline for intensity in rehab    Able to check pulse independently Yes Yes Yes Yes    Intervention Provide education and demonstration on how to check pulse in carotid and radial arteries.;Review the importance of being able to check your own pulse for safety during independent exercise Provide education and demonstration on how to check pulse in carotid and radial arteries.;Review the importance of being able to check your own pulse for safety during independent exercise Provide education and demonstration on how to check pulse in carotid and radial arteries.;Review the importance of being able to check your own pulse for safety during independent exercise Provide education and demonstration on how to check pulse in carotid and radial arteries.;Review the importance of being able to check your own pulse for safety during independent exercise    Expected Outcomes Short Term: Able to explain why pulse checking is important during independent exercise;Long Term: Able to check pulse independently and accurately Short Term: Able to explain why pulse checking is important during independent exercise;Long Term: Able to check pulse independently and accurately Short Term: Able to explain why pulse checking is important during independent exercise;Long Term: Able to check pulse independently and accurately Short Term: Able to explain why pulse checking is important during independent exercise;Long Term: Able to check pulse independently and accurately    Understanding of Exercise Prescription Yes Yes Yes Yes    Intervention Provide education, explanation, and written  materials on patient's individual exercise prescription Provide education, explanation, and written materials on patient's individual exercise prescription Provide education, explanation, and written materials on patient's individual exercise prescription Provide education, explanation, and written materials on patient's individual exercise prescription    Expected Outcomes Short Term: Able to explain program exercise prescription;Long Term: Able to explain home exercise prescription to exercise independently Short Term: Able to explain program exercise prescription;Long Term: Able to explain home exercise prescription to exercise independently Short Term: Able to explain program exercise prescription;Long Term: Able to explain home exercise prescription to exercise independently Short Term: Able to explain program exercise prescription;Long Term: Able to explain home exercise prescription to exercise independently           Exercise Goals Re-Evaluation:  Exercise Goals Re-Evaluation    Row Name 07/18/20 1032 08/15/20 0911 09/12/20 1041         Exercise Goal Re-Evaluation   Exercise Goals Review Increase Physical Activity;Increase Strength and Stamina;Able to understand and use rate of perceived exertion (RPE) scale;Knowledge and understanding of Target Heart Rate Range (THRR);Able to check pulse independently;Understanding of Exercise Prescription Increase Physical Activity;Increase Strength and Stamina;Able to understand and use rate of perceived exertion (RPE) scale;Knowledge and understanding of Target Heart Rate Range (THRR);Able to check pulse independently;Understanding of Exercise Prescription Increase Physical Activity;Increase Strength and Stamina;Able to understand and use rate of perceived exertion (RPE) scale;Knowledge and understanding of Target Heart Rate Range (THRR);Able to check pulse independently;Understanding of Exercise Prescription     Comments Patient  has completed 4 exercise  sessions. He tolerating exercise well, but complains of a little bit of knee pain. He says he is going to the doctor to see about a knee replacement. He had to switch to the arm ergometer for the second station because of his knee pain. He says this feels much better. He is progressing well and continues to ask to increase his intensities.  He is enthusiastic about coming to rehab and is very pleasant to be around. He is currently exercising at 4.7 METs on the Elliptical. Will contine to progress as able. Patient has completed 16 exercise sessions. He has tolerated exercise well and is progressing very well. He still complains of knee pain but it is not as much. He says his knee gets sore and stiff after exercise. He says that exercising is helping him so much at home. He feels he is getting stronger and is able to do much more at home. He is still exercising at home and is progressing his intensities at home. He is currently exercising at 6.0 METs on the Elliptical. Will continue to monitor and progress as able. Patient has completed 28 execise sessions. He has progressed very nicely throughout the program. He is getting stonger and his stamina is increasing. He complains a little bit about knee stiffness after exercising on the elliptical. The last few exercise sessions he had complained of a pain in his rib on his left side. He said it felt like a cramp. The pain has not been back in 2 exercise sessions after he increased his water intake before exercise. He is currently exercising at home by walking and biking for 30 minutes per day when not in rehab. He also does some weight training at home. He is currently exercising at 5.3 METs on the elliptical. Will continue to monitor and progress as able.     Expected Outcomes Through exercise at rehab and with a home exercise program, patient will reach their goals. Through exercise at rehab and with a home exercise program, patient will reach their goals. Through  exercise at rehab and with a home exercise program, patient will reach their goals.            Nutrition & Weight - Outcomes:  Pre Biometrics - 09/28/20 0845      Pre Biometrics   Height '5\' 8"'  (1.727 m)    Weight 79.6 kg    Waist Circumference 39 inches    Hip Circumference 38 inches    Waist to Hip Ratio 1.03 %    BMI (Calculated) 26.69    Triceps Skinfold 8 mm    % Body Fat 24.2 %    Grip Strength 17 kg    Flexibility 22.5 in    Single Leg Stand 13 seconds            Nutrition:  Nutrition Therapy & Goals - 08/10/20 0944      Personal Nutrition Goals   Comments We will continue to provide heart heathy nutritional education through handouts.      Intervention Plan   Intervention Nutrition handout(s) given to patient.           Nutrition Discharge:  Nutrition Assessments - 10/24/20 1328      MEDFICTS Scores   Pre Score 61    Post Score 36    Score Difference -25           Education Questionnaire Score:  Knowledge Questionnaire Score - 10/24/20 1329      Knowledge Questionnaire Score   Pre Score 21/24    Post Score 23/24          Patient graduated from [facility] today on 09-28-20 after completing 36 sessions. They achieved LTG of 30 minutes of aerobic exercise at Max Met level of 5.5. All patients vitals are WNL. Discharge instruction has been reviewed in detail and patient stated an understanding of  material given. Patient plans to exercise at home. Cardiac Rehab staff will make f/u call. Patient had no complaints of any abnormal S/S or pain on their exit visit.     Goals reviewed with patient; copy given to patient.

## 2020-10-24 NOTE — Addendum Note (Signed)
Encounter addended by: Lorin Glass on: 10/24/2020 1:38 PM  Actions taken: Flowsheet data copied forward, Flowsheet accepted, Clinical Note Signed, Episode resolved

## 2021-02-27 ENCOUNTER — Ambulatory Visit: Payer: Medicare Other | Admitting: Internal Medicine

## 2021-03-01 ENCOUNTER — Other Ambulatory Visit: Payer: Self-pay

## 2021-03-02 ENCOUNTER — Ambulatory Visit (INDEPENDENT_AMBULATORY_CARE_PROVIDER_SITE_OTHER): Payer: Medicare Other | Admitting: Internal Medicine

## 2021-03-02 ENCOUNTER — Encounter: Payer: Self-pay | Admitting: Internal Medicine

## 2021-03-02 VITALS — BP 128/70 | HR 46 | Temp 97.7°F | Ht 67.0 in | Wt 172.0 lb

## 2021-03-02 DIAGNOSIS — E538 Deficiency of other specified B group vitamins: Secondary | ICD-10-CM

## 2021-03-02 DIAGNOSIS — E559 Vitamin D deficiency, unspecified: Secondary | ICD-10-CM | POA: Diagnosis not present

## 2021-03-02 DIAGNOSIS — I6523 Occlusion and stenosis of bilateral carotid arteries: Secondary | ICD-10-CM

## 2021-03-02 DIAGNOSIS — E7800 Pure hypercholesterolemia, unspecified: Secondary | ICD-10-CM

## 2021-03-02 DIAGNOSIS — Z23 Encounter for immunization: Secondary | ICD-10-CM | POA: Diagnosis not present

## 2021-03-02 DIAGNOSIS — M545 Low back pain, unspecified: Secondary | ICD-10-CM

## 2021-03-02 MED ORDER — PREDNISONE 10 MG PO TABS: ORAL_TABLET | ORAL | 0 refills | Status: DC

## 2021-03-02 MED ORDER — VITAMIN B-12 1000 MCG PO TABS: 1000.0000 ug | ORAL_TABLET | Freq: Every day | ORAL | 1 refills | Status: DC

## 2021-03-02 MED ORDER — HYDROCODONE-ACETAMINOPHEN 5-325 MG PO TABS: 1.0000 | ORAL_TABLET | Freq: Four times a day (QID) | ORAL | 0 refills | Status: DC | PRN

## 2021-03-02 MED ORDER — THERA-D 2000 50 MCG (2000 UT) PO TABS: ORAL_TABLET | ORAL | 99 refills | Status: DC

## 2021-03-02 NOTE — Patient Instructions (Addendum)
Please take all new medication as prescribed - the pain medicine and the prednisone  Please return in 2-3 wks if not improved to consider MRI  Please take OTC Vitamin D3 at 2000 units per day, indefinitely  Please also take OTC B12 1000 mcg per day for at least 6 months  Please continue all other medications as before, and refills have been done if requested.  Please have the pharmacy call with any other refills you may need.  Please continue your efforts at being more active, low cholesterol diet, and weight control  Please keep your appointments with your specialists as you may have planned  You will be contacted regarding the referral for: Lipid clinic  Please make an Appointment to return in 6 months, or sooner if needed
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
## 2021-03-02 NOTE — Assessment & Plan Note (Signed)
Last vitamin D Lab Results  Component Value Date   VD25OH 28.67 (L) 08/25/2020   Low, to start oral replacement

## 2021-03-02 NOTE — Assessment & Plan Note (Signed)
Lab Results  Component Value Date   VITAMINB12 211 08/25/2020   Low to start oral replacement - b12 1000 mcg qd

## 2021-03-05 NOTE — Assessment & Plan Note (Signed)
Lab Results  Component Value Date   LDLCALC 140 (H) 04/27/2020   Uncontrolled, statin intolerant, pt for referral lipid clinic

## 2021-03-05 NOTE — Assessment & Plan Note (Signed)
Acute onset as above, for vicodin limited rx prn use, predpac asd, consider MRI and/or ortho if not improved

## 2021-03-09 DIAGNOSIS — U071 COVID-19: Secondary | ICD-10-CM | POA: Diagnosis not present

## 2021-03-21 ENCOUNTER — Ambulatory Visit (INDEPENDENT_AMBULATORY_CARE_PROVIDER_SITE_OTHER): Payer: Medicare Other | Admitting: Pharmacist

## 2021-03-21 ENCOUNTER — Other Ambulatory Visit: Payer: Self-pay

## 2021-03-21 VITALS — BP 118/80 | HR 62 | Resp 15 | Ht 68.0 in | Wt 177.0 lb

## 2021-03-21 DIAGNOSIS — I6523 Occlusion and stenosis of bilateral carotid arteries: Secondary | ICD-10-CM | POA: Diagnosis not present

## 2021-03-21 DIAGNOSIS — T466X5A Adverse effect of antihyperlipidemic and antiarteriosclerotic drugs, initial encounter: Secondary | ICD-10-CM | POA: Diagnosis not present

## 2021-03-21 DIAGNOSIS — M791 Myalgia, unspecified site: Secondary | ICD-10-CM | POA: Diagnosis not present

## 2021-03-21 DIAGNOSIS — I700 Atherosclerosis of aorta: Secondary | ICD-10-CM | POA: Diagnosis not present

## 2021-03-21 DIAGNOSIS — E7800 Pure hypercholesterolemia, unspecified: Secondary | ICD-10-CM

## 2021-03-21 NOTE — Progress Notes (Signed)
Patient ID: Guy Morris                 DOB: 05-Nov-1950                    MRN: [id number]     HPI: Guy Morris is a 70 y.o. male patient referred to lipid clinic by Dr Percival Spanish. PMH is significant for CAD, CABG, HLD, carotid stenosis, pre DM, and A Fib.  Patient presents today in good spirits. Is intolerant to rosuvastatin, atorvastatin, and simvastatin.  Reports he can take them for about 3 days until his legs and arm muscles lock up and he can not get out of bed.  Reports intolerance to Zetia also but do not see on previously taken medications.  Has a strong family history of CAD all on his father's side.  Father, and siblings all have had elevated cholesterol and CAD.  Once diagnosed, began working on diet changes but has not made an impact. Now only eats chicken or fish once a week and the rest of the time eats steamed vegetables (broccoli, asparagus, carrots, brussell sprouts) but has not been effective.    Current Medications: n/a  Intolerances:  Atorvastatin Rosuvastatin Simvastatin Zetia?  Risk Factors: CAD, family history, hx of CABG LDL goal: <70  Labs: TC 220, Trigs 218, HDL 45.3, Direct LDL 148 (08/25/20 - not on any lipid lowering therapy)  Past Medical History:  Diagnosis Date   ABNORMAL TRANSAMINASE-LFT'S 08/04/2008   Qualifier: Diagnosis of  By: Fuller Plan MD Lamont Snowball T    ALLERGIC RHINITIS 05/06/2007   Qualifier: Diagnosis of  By: Jenny Reichmann MD, Hunt Oris    Allergy    BPH (benign prostatic hyperplasia) 04/28/2012   COPD (chronic obstructive pulmonary disease) ([place])    Degenerative joint disease 04/23/2011   ERECTILE DYSFUNCTION 05/06/2007   Qualifier: Diagnosis of  By: Jenny Reichmann MD, Hunt Oris    GERD 05/06/2007   Qualifier: Diagnosis of  By: Jenny Reichmann MD, Hunt Oris    HYPERLIPIDEMIA 05/06/2007   Qualifier: Diagnosis of  By: Jenny Reichmann MD, [place] 05/06/2007   Qualifier: Diagnosis of  By: Jenny Reichmann MD, Hunt Oris    Insomnia 04/28/2012   OSTEOARTHRITIS, KNEE, RIGHT  05/06/2007   Qualifier: Diagnosis of  By: Jenny Reichmann MD, Hunt Oris    Personal history of colonic polyps 07/20/2008   Centricity Description: PERSONAL HX COLONIC POLYPS Qualifier: Diagnosis of  By: Ardis Hughs MD, Melene Plan  Centricity Description: COLONIC POLYPS, HX OF Qualifier: Diagnosis of  By: Jenny Reichmann MD, Hunt Oris    RLS (restless legs syndrome) 10/17/2010   Urge incontinence 04/28/2012   [facility] heps per urology    Current Outpatient Medications on File Prior to Visit  Medication Sig Dispense Refill   ascorbic acid (VITAMIN C) 500 MG tablet Take 500 mg by mouth daily.     aspirin EC 81 MG tablet Take 81 mg by mouth daily. Swallow whole.     Ca Carbonate-Mag Hydroxide (ROLAIDS PO) Take 1 tablet by mouth 3 (three) times daily as needed (heartburn).     Cholecalciferol (THERA-D 2000) 50 MCG (2000 UT) TABS 1 tab by mouth once daily 30 tablet 99   HYDROcodone-acetaminophen (NORCO/VICODIN) 5-325 MG tablet Take 1 tablet by mouth every 6 (six) hours as needed. 30 tablet 0   levothyroxine (SYNTHROID) 175 MCG tablet Take 1 tablet (175 mcg total) by mouth daily before breakfast. 90 tablet 3   metoprolol tartrate (LOPRESSOR) 50 MG tablet Take  1 tablet (50 mg total) by mouth 2 (two) times daily. 180 tablet 3   nitroGLYCERIN (NITROSTAT) 0.4 MG SL tablet Place 1 tablet (0.4 mg total) under the tongue every 5 (five) minutes as needed for chest pain. 90 tablet 3   omeprazole (PRILOSEC) 20 MG capsule Take 20 mg by mouth daily as needed (uses OTC for heartburn).     polyethylene glycol (MIRALAX / GLYCOLAX) 17 g packet Take 17 g by mouth daily. 14 each 0   predniSONE (DELTASONE) 10 MG tablet 3 tabs by mouth per day for 3 days,2tabs per day for 3 days,1tab per day for 3 days 18 tablet 0   Turmeric Curcumin 500 MG CAPS Take 1,000 mg by mouth daily.     vitamin B-12 (CYANOCOBALAMIN) 1000 MCG tablet Take 1 tablet (1,000 mcg total) by mouth daily. 90 tablet 1   No current facility-administered medications on file prior to visit.     Allergies  Allergen Reactions   Atorvastatin     myalgia   Claritin [Loratadine]     dizzy   Rofecoxib Itching   Rosuvastatin     myalgia   Zocor [Simvastatin] Other (See Comments)    myalgia    Assessment/Plan:  1. Hyperlipidemia - Patient last LDL 143 which is above goal of <70.  Since patient intolerant to statins, recommend next step to try PCSK9i which patient has previously been resistant to due to cost.  Using Repatha demo pen, educated patient on mechanism of action, storage, site selection, administration, and possible adverse effects.  Printed patient assistance paperwork for patient for him to fill out but he will bring back to office since he forgot his glasses at home.  Once approved, will recheck lipid panel in 2-3 months.  Patient voiced understanding.  Karren Cobble, PharmD, BCACP, [place], [place] [address] [place], [place] [address] Phone: [phone]; Fax: [phone] 03/21/2021 9:17 AM

## 2021-03-21 NOTE — Patient Instructions (Addendum)
It was nice meeting you today  We would like your LDL (bad cholesterol) to be less than 70  We will start a new medication called Repatha which you will inject once every 2 weeks  Please bring back your application and we will have Dr Percival Spanish sign it for you and we will send it off to the company  After your start the medication, we will recheck your cholesterol in about 2 to 3 months  Please call with any questions  Karren Cobble, PharmD, BCACP, [place], [place]. [address], [place], [place] [address] Phone: [phone]; Fax: [phone] 03/21/2021 9:07 AM

## 2021-04-11 DIAGNOSIS — I2510 Atherosclerotic heart disease of native coronary artery without angina pectoris: Secondary | ICD-10-CM

## 2021-04-11 HISTORY — DX: Atherosclerotic heart disease of native coronary artery without angina pectoris: I25.10

## 2021-04-13 ENCOUNTER — Encounter (HOSPITAL_COMMUNITY): Payer: Medicare Other

## 2021-04-18 ENCOUNTER — Ambulatory Visit (HOSPITAL_COMMUNITY)
Admission: RE | Admit: 2021-04-18 | Discharge: 2021-04-18 | Disposition: A | Discharge status: Home / Self Care | Payer: Medicare Other | Source: Ambulatory Visit | Attending: Cardiology | Admitting: Cardiology

## 2021-04-18 ENCOUNTER — Other Ambulatory Visit: Payer: Self-pay

## 2021-04-18 ENCOUNTER — Telehealth: Payer: Self-pay | Admitting: Internal Medicine

## 2021-04-18 DIAGNOSIS — I6523 Occlusion and stenosis of bilateral carotid arteries: Secondary | ICD-10-CM | POA: Diagnosis not present

## 2021-04-18 NOTE — Telephone Encounter (Signed)
Left message for patient to call me back at [phone] to schedule Medicare Annual Wellness Visit   Last AWV  02/22/20  Please schedule at anytime with LB Pershing if patient calls the office back.    40 Minutes appointment   Any questions, please call me at [phone]

## 2021-04-20 ENCOUNTER — Other Ambulatory Visit: Payer: Self-pay

## 2021-04-20 DIAGNOSIS — I6523 Occlusion and stenosis of bilateral carotid arteries: Secondary | ICD-10-CM

## 2021-04-21 ENCOUNTER — Other Ambulatory Visit: Payer: Self-pay | Admitting: Internal Medicine

## 2021-04-24 ENCOUNTER — Encounter: Payer: Self-pay | Admitting: *Deleted

## 2021-05-02 ENCOUNTER — Other Ambulatory Visit: Payer: Self-pay

## 2021-05-02 ENCOUNTER — Ambulatory Visit (INDEPENDENT_AMBULATORY_CARE_PROVIDER_SITE_OTHER): Payer: Medicare Other

## 2021-05-02 VITALS — BP 118/70 | HR 57 | Temp 98.2°F | Ht 68.0 in | Wt 171.0 lb

## 2021-05-02 DIAGNOSIS — H538 Other visual disturbances: Secondary | ICD-10-CM

## 2021-05-02 DIAGNOSIS — Z0000 Encounter for general adult medical examination without abnormal findings: Secondary | ICD-10-CM

## 2021-05-02 DIAGNOSIS — R1084 Generalized abdominal pain: Secondary | ICD-10-CM | POA: Diagnosis not present
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
## 2021-05-02 NOTE — Patient Instructions (Signed)
[name] , Thank you for taking time to come for your Medicare Wellness Visit. I appreciate your ongoing commitment to your health goals. Please review the following plan we discussed and let me know if I can assist you in the future.   Screening recommendations/referrals: Colonoscopy: 02/06/2017; due every 5 years (due 01/2022) Recommended yearly ophthalmology/optometry visit for glaucoma screening and checkup Recommended yearly dental visit for hygiene and checkup  Vaccinations: Influenza vaccine: 03/02/2021 Pneumococcal vaccine: 11/06/2016, 11/05/2017 Tdap vaccine: 08/14/2018; due every 10 years (due 08/2028) Shingrix vaccine: Please call your insurance company to determine your out of pocket expense for the Shingrix vaccine. You may receive this vaccine at your local pharmacy.   Covid-19: 09/16/2019     Advanced directives: Please bring a copy of your health care power of attorney and living will to the office at your convenience.  Conditions/risks identified: Yes; Client understands the importance of follow-up with providers by attending scheduled visits and discussed goals to eat healthier, increase physical activity, exercise the brain, socialize more, get enough sleep and make time for laughter.  Next appointment: Please schedule your next Medicare Wellness Visit with your Nurse Health Advisor in 1 year by calling [phone].  Preventive Care 5 Years and Older, Male Preventive care refers to lifestyle choices and visits with your health care provider that can promote health and wellness. What does preventive care include? A yearly physical exam. This is also called an annual well check. Dental exams once or twice a year. Routine eye exams. Ask your health care provider how often you should have your eyes checked. Personal lifestyle choices, including: Daily care of your teeth and gums. Regular physical activity. Eating a healthy diet. Avoiding tobacco and drug use. Limiting alcohol  use. Practicing safe sex. Taking low doses of aspirin every day. Taking vitamin and mineral supplements as recommended by your health care provider. What happens during an annual well check? The services and screenings done by your health care provider during your annual well check will depend on your age, overall health, lifestyle risk factors, and family history of disease. Counseling  Your health care provider may ask you questions about your: Alcohol use. Tobacco use. Drug use. Emotional well-being. Home and relationship well-being. Sexual activity. Eating habits. History of falls. Memory and ability to understand (cognition). Work and work Statistician. Screening  You may have the following tests or measurements: Height, weight, and BMI. Blood pressure. Lipid and cholesterol levels. These may be checked every 5 years, or more frequently if you are over 65 years old. Skin check. Lung cancer screening. You may have this screening every year starting at age 42 if you have a 30-pack-year history of smoking and currently smoke or have quit within the past 15 years. Fecal occult blood test (FOBT) of the stool. You may have this test every year starting at age 70. Flexible sigmoidoscopy or colonoscopy. You may have a sigmoidoscopy every 5 years or a colonoscopy every 10 years starting at age 15. Prostate cancer screening. Recommendations will vary depending on your family history and other risks. Hepatitis C blood test. Hepatitis B blood test. Sexually transmitted disease (STD) testing. Diabetes screening. This is done by checking your blood sugar (glucose) after you have not eaten for a while (fasting). You may have this done every 1-3 years. Abdominal aortic aneurysm (AAA) screening. You may need this if you are a current or former smoker. Osteoporosis. You may be screened starting at age 34 if you are at high risk.  Talk with your health care provider about your test results,  treatment options, and if necessary, the need for more tests. Vaccines  Your health care provider may recommend certain vaccines, such as: Influenza vaccine. This is recommended every year. Tetanus, diphtheria, and acellular pertussis (Tdap, Td) vaccine. You may need a Td booster every 10 years. Zoster vaccine. You may need this after age 79. Pneumococcal 13-valent conjugate (PCV13) vaccine. One dose is recommended after age 92. Pneumococcal polysaccharide (PPSV23) vaccine. One dose is recommended after age 26. Talk to your health care provider about which screenings and vaccines you need and how often you need them. This information is not intended to replace advice given to you by your health care provider. Make sure you discuss any questions you have with your health care provider. Document Released: 06/24/2015 Document Revised: 02/15/2016 Document Reviewed: 03/29/2015 Elsevier Interactive Patient Education  2017 [place] Prevention in the Home Falls can cause injuries. They can happen to people of all ages. There are many things you can do to make your home safe and to help prevent falls. What can I do on the outside of my home? Regularly fix the edges of walkways and driveways and fix any cracks. Remove anything that might make you trip as you walk through a door, such as a raised step or threshold. Trim any bushes or trees on the path to your home. Use bright outdoor lighting. Clear any walking paths of anything that might make someone trip, such as rocks or tools. Regularly check to see if handrails are loose or broken. Make sure that both sides of any steps have handrails. Any raised decks and porches should have guardrails on the edges. Have any leaves, snow, or ice cleared regularly. Use sand or salt on walking paths during winter. Clean up any spills in your garage right away. This includes oil or grease spills. What can I do in the bathroom? Use night  lights. Install grab bars by the toilet and in the tub and shower. Do not use towel bars as grab bars. Use non-skid mats or decals in the tub or shower. If you need to sit down in the shower, use a plastic, non-slip stool. Keep the floor dry. Clean up any water that spills on the floor as soon as it happens. Remove soap buildup in the tub or shower regularly. Attach bath mats securely with double-sided non-slip rug tape. Do not have throw rugs and other things on the floor that can make you trip. What can I do in the bedroom? Use night lights. Make sure that you have a light by your bed that is easy to reach. Do not use any sheets or blankets that are too big for your bed. They should not hang down onto the floor. Have a firm chair that has side arms. You can use this for support while you get dressed. Do not have throw rugs and other things on the floor that can make you trip. What can I do in the kitchen? Clean up any spills right away. Avoid walking on wet floors. Keep items that you use a lot in easy-to-reach places. If you need to reach something above you, use a strong step stool that has a grab bar. Keep electrical cords out of the way. Do not use floor polish or wax that makes floors slippery. If you must use wax, use non-skid floor wax. Do not have throw rugs and other things on the floor that can make  you trip. What can I do with my stairs? Do not leave any items on the stairs. Make sure that there are handrails on both sides of the stairs and use them. Fix handrails that are broken or loose. Make sure that handrails are as long as the stairways. Check any carpeting to make sure that it is firmly attached to the stairs. Fix any carpet that is loose or worn. Avoid having throw rugs at the top or bottom of the stairs. If you do have throw rugs, attach them to the floor with carpet tape. Make sure that you have a light switch at the top of the stairs and the bottom of the stairs. If  you do not have them, ask someone to add them for you. What else can I do to help prevent falls? Wear shoes that: Do not have high heels. Have rubber bottoms. Are comfortable and fit you well. Are closed at the toe. Do not wear sandals. If you use a stepladder: Make sure that it is fully opened. Do not climb a closed stepladder. Make sure that both sides of the stepladder are locked into place. Ask someone to hold it for you, if possible. Clearly mark and make sure that you can see: Any grab bars or handrails. First and last steps. Where the edge of each step is. Use tools that help you move around (mobility aids) if they are needed. These include: Canes. Walkers. Scooters. Crutches. Turn on the lights when you go into a dark area. Replace any light bulbs as soon as they burn out. Set up your furniture so you have a clear path. Avoid moving your furniture around. If any of your floors are uneven, fix them. If there are any pets around you, be aware of where they are. Review your medicines with your doctor. Some medicines can make you feel dizzy. This can increase your chance of falling. Ask your doctor what other things that you can do to help prevent falls. This information is not intended to replace advice given to you by your health care provider. Make sure you discuss any questions you have with your health care provider. Document Released: 03/24/2009 Document Revised: 11/03/2015 Document Reviewed: 07/02/2014 Elsevier Interactive Patient Education  2017 [employer].

## 2021-05-17 ENCOUNTER — Encounter: Payer: Self-pay | Admitting: Gastroenterology

## 2021-06-11 HISTORY — PX: COLONOSCOPY: SHX174

## 2021-06-21 ENCOUNTER — Encounter: Payer: Self-pay | Admitting: Gastroenterology

## 2021-06-21 ENCOUNTER — Ambulatory Visit (INDEPENDENT_AMBULATORY_CARE_PROVIDER_SITE_OTHER): Payer: Medicare Other | Admitting: Gastroenterology

## 2021-06-21 ENCOUNTER — Other Ambulatory Visit (INDEPENDENT_AMBULATORY_CARE_PROVIDER_SITE_OTHER): Payer: Medicare Other

## 2021-06-21 DIAGNOSIS — R1032 Left lower quadrant pain: Secondary | ICD-10-CM | POA: Diagnosis not present

## 2021-06-21 DIAGNOSIS — R194 Change in bowel habit: Secondary | ICD-10-CM

## 2021-06-21 LAB — COMPREHENSIVE METABOLIC PANEL
ALT: 12 U/L (ref 0–53)
AST: 15 U/L (ref 0–37)
Albumin: 4.6 g/dL (ref 3.5–5.2)
Alkaline Phosphatase: 80 U/L (ref 39–117)
BUN: 13 mg/dL (ref 6–23)
CO2: 25 mEq/L (ref 19–32)
Calcium: 10.1 mg/dL (ref 8.4–10.5)
Chloride: 102 mEq/L (ref 96–112)
Creatinine, Ser: 0.84 mg/dL (ref 0.40–1.50)
GFR: 88.84 mL/min (ref >60.00)
Glucose, Bld: 107 mg/dL — ABNORMAL HIGH (ref 70–99)
Potassium: 4 mEq/L (ref 3.5–5.1)
Sodium: 136 mEq/L (ref 135–145)
Total Bilirubin: 0.6 mg/dL (ref 0.2–1.2)
Total Protein: 7.4 g/dL (ref 6.0–8.3)

## 2021-06-21 LAB — CBC
HCT: 46.8 % (ref 39.0–52.0)
Hemoglobin: 15.7 g/dL (ref 13.0–17.0)
MCHC: 33.6 g/dL (ref 30.0–36.0)
MCV: 92.6 fl (ref 78.0–100.0)
Platelets: 244 10*3/uL (ref 150.0–400.0)
RBC: 5.06 Mil/uL (ref 4.22–5.81)
RDW: 12.8 % (ref 11.5–15.5)
WBC: 6.6 10*3/uL (ref 4.0–10.5)

## 2021-06-21 MED ORDER — CITRUCEL PO POWD: 1.0000 | Freq: Every day | ORAL | Status: DC

## 2021-06-21 NOTE — Patient Instructions (Signed)
If you are age 69 or older, your body mass index should be between 23-30. Your Body mass index is 26.46 kg/m. If this is out of the aforementioned range listed, please consider follow up with your Primary Care Provider. ________________________________________________________  The [facility] providers would like to encourage you to use [facility] to communicate with providers for non-urgent requests or questions.  Due to long hold times on the telephone, sending your provider a message by [facility] may be a faster and more efficient way to get a response.  Please allow 48 business hours for a response.  Please remember that this is for non-urgent requests.  _______________________________________________________  Your provider has requested that you go to the basement level for lab work before leaving today. Press "B" on the elevator. The lab is located at the first door on the left as you exit the elevator.  You have been scheduled for a CT scan of the abdomen and pelvis at [facility], 1st floor Radiology. You are scheduled on 06/29/2021  at [place] should arrive 15 minutes prior to your appointment time for registration.  Please pick up 2 bottles of contrast from [place] at least 3 days prior to your scan. The solution may taste better if refrigerated, but do NOT add ice or any other liquid to this solution. Shake well before drinking.   Please follow the written instructions below on the day of your exam:   1) Do not eat anything after 4am (4 hours prior to your test)   2) Drink 1 bottle of contrast @ 6am (2 hours prior to your exam)  Remember to shake well before drinking and do NOT pour over ice.     Drink 1 bottle of contrast @ 7am (1 hour prior to your exam)   You may take any medications as prescribed with a small amount of water, if necessary. If you take any of the following medications: METFORMIN, GLUCOPHAGE, GLUCOVANCE, AVANDAMET, RIOMET, FORTAMET, Norco MET, JANUMET,  GLUMETZA or METAGLIP, you MAY be asked to HOLD this medication 48 hours AFTER the exam.   The purpose of you drinking the oral contrast is to aid in the visualization of your intestinal tract. The contrast solution may cause some diarrhea. Depending on your individual set of symptoms, you may also receive an intravenous injection of x-ray contrast/dye. Plan on being at [facility] for 45 minutes or longer, depending on the type of exam you are having performed.   If you have any questions regarding your exam or if you need to reschedule, you may call [facility] at [phone] between the hours of 8:00 am and 5:00 pm, Monday-Friday.   Due to recent changes in healthcare laws, you may see the results of your imaging and laboratory studies on MyChart before your provider has had a chance to review them.  We understand that in some cases there may be results that are confusing or concerning to you. Not all laboratory results come back in the same time frame and the provider may be waiting for multiple results in order to interpret others.  Please give [place] 48 hours in order for your provider to thoroughly review all the results before contacting the office for clarification of your results.   STOP:Miralx  Please start taking citrucel (orange flavored) powder fiber supplement.  This may cause some bloating at first but that usually goes away. Begin with a small spoonful and work your way up to a large, heaping spoonful  daily over a week.   Thank you for entrusting me with your care and choosing [facility].  Dr Ardis Hughs
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
## 2021-06-29 ENCOUNTER — Ambulatory Visit (HOSPITAL_COMMUNITY)
Admission: RE | Admit: 2021-06-29 | Discharge: 2021-06-29 | Disposition: A | Discharge status: Home / Self Care | Payer: Medicare Other | Source: Ambulatory Visit | Attending: Gastroenterology | Admitting: Gastroenterology

## 2021-06-29 ENCOUNTER — Other Ambulatory Visit: Payer: Self-pay

## 2021-06-29 DIAGNOSIS — R1032 Left lower quadrant pain: Secondary | ICD-10-CM | POA: Diagnosis not present

## 2021-06-29 DIAGNOSIS — R194 Change in bowel habit: Secondary | ICD-10-CM | POA: Diagnosis not present

## 2021-06-29 DIAGNOSIS — N200 Calculus of kidney: Secondary | ICD-10-CM | POA: Diagnosis not present

## 2021-06-29 DIAGNOSIS — K5730 Diverticulosis of large intestine without perforation or abscess without bleeding: Secondary | ICD-10-CM | POA: Diagnosis not present

## 2021-06-29 MED ORDER — IOHEXOL 300 MG/ML  SOLN
100.0000 mL | Freq: Once | INTRAMUSCULAR | Status: AC | PRN
  Administered 2021-06-29: 100 mL via INTRAVENOUS

## 2021-06-29 MED ORDER — SODIUM CHLORIDE (PF) 0.9 % IJ SOLN
INTRAMUSCULAR | Status: AC
  Filled 2021-06-29: qty 50

## 2021-07-03 ENCOUNTER — Other Ambulatory Visit: Payer: Self-pay

## 2021-07-03 DIAGNOSIS — R1032 Left lower quadrant pain: Secondary | ICD-10-CM

## 2021-07-03 DIAGNOSIS — R194 Change in bowel habit: Secondary | ICD-10-CM

## 2021-07-03 NOTE — Progress Notes (Signed)
Patient advised of CT results per Dr Ardis Hughs. Patient instructed that we will need a colonoscopy to continue work up of left sided abdominal pains.  Patient scheduled for colonoscopy on 08-23-21 at 10:30am.  Patient agreed to plan and verbalized understanding.  No further questions.

## 2021-08-23 ENCOUNTER — Ambulatory Visit (AMBULATORY_SURGERY_CENTER): Payer: Medicare Other | Admitting: Gastroenterology

## 2021-08-23 ENCOUNTER — Other Ambulatory Visit: Payer: Self-pay | Admitting: Gastroenterology

## 2021-08-23 ENCOUNTER — Encounter: Payer: Self-pay | Admitting: Gastroenterology

## 2021-08-23 VITALS — BP 109/77 | HR 57 | Temp 96.4°F | Resp 18 | Ht 68.0 in | Wt 174.0 lb

## 2021-08-23 DIAGNOSIS — R1032 Left lower quadrant pain: Secondary | ICD-10-CM

## 2021-08-23 DIAGNOSIS — K635 Polyp of colon: Secondary | ICD-10-CM

## 2021-08-23 DIAGNOSIS — R194 Change in bowel habit: Secondary | ICD-10-CM | POA: Diagnosis not present

## 2021-08-23 DIAGNOSIS — K5730 Diverticulosis of large intestine without perforation or abscess without bleeding: Secondary | ICD-10-CM

## 2021-08-23 DIAGNOSIS — D125 Benign neoplasm of sigmoid colon: Secondary | ICD-10-CM | POA: Diagnosis not present

## 2021-08-23 MED ORDER — SODIUM CHLORIDE 0.9 % IV SOLN: 500.0000 mL | Freq: Once | INTRAVENOUS | Status: DC

## 2021-08-23 NOTE — Progress Notes (Signed)
PT taken to PACU. Monitors in place. VSS. Report given to RN. 
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
## 2021-08-23 NOTE — Progress Notes (Signed)
Pt's states no medical or surgical changes since previsit or office visit. VS assessed by C.W 

## 2021-08-23 NOTE — Progress Notes (Signed)
Called to room to assist during endoscopic procedure.  Patient ID and intended procedure confirmed with present staff. Received instructions for my participation in the procedure from the performing physician.  

## 2021-08-23 NOTE — Patient Instructions (Addendum)
Handout on polyps and diverticulosis provided  ? ?Await pathology results.  ? ?Continue current medications. Continue present medications. Continue twice daily fiber supplement, this seems to be really helping your bowels. ? ?YOU HAD AN ENDOSCOPIC PROCEDURE TODAY AT [facility]:   Refer to the procedure report that was given to you for any specific questions about what was found during the examination.  If the procedure report does not answer your questions, please call your gastroenterologist to clarify.  If you requested that your care partner not be given the details of your procedure findings, then the procedure report has been included in a sealed envelope for you to review at your convenience later. ? ?YOU SHOULD EXPECT: Some feelings of bloating in the abdomen. Passage of more gas than usual.  Walking can help get rid of the air that was put into your GI tract during the procedure and reduce the bloating. If you had a lower endoscopy (such as a colonoscopy or flexible sigmoidoscopy) you may notice spotting of blood in your stool or on the toilet paper. If you underwent a bowel prep for your procedure, you may not have a normal bowel movement for a few days. ? ?Please Note:  You might notice some irritation and congestion in your nose or some drainage.  This is from the oxygen used during your procedure.  There is no need for concern and it should clear up in a day or so. ? ?SYMPTOMS TO REPORT IMMEDIATELY: ? ?Following lower endoscopy (colonoscopy or flexible sigmoidoscopy): ? Excessive amounts of blood in the stool ? Significant tenderness or worsening of abdominal pains ? Swelling of the abdomen that is new, acute ? Fever of 100?F or higher ? ?For urgent or emergent issues, a gastroenterologist can be reached at any hour by calling [phone]. ?Do not use MyChart messaging for urgent concerns.  ? ? ?DIET:  We do recommend a small meal at first, but then you may proceed to your regular  diet.  Drink plenty of fluids but you should avoid alcoholic beverages for 24 hours. ? ?ACTIVITY:  You should plan to take it easy for the rest of today and you should NOT DRIVE or use heavy machinery until tomorrow (because of the sedation medicines used during the test).   ? ?FOLLOW UP: ?Our staff will call the number listed on your records 48-72 hours following your procedure to check on you and address any questions or concerns that you may have regarding the information given to you following your procedure. If we do not reach you, we will leave a message.  We will attempt to reach you two times.  During this call, we will ask if you have developed any symptoms of COVID 19. If you develop any symptoms (ie: fever, flu-like symptoms, shortness of breath, cough etc.) before then, please call [phone].  If you test positive for Covid 19 in the 2 weeks post procedure, please call and report this information to [place].   ? ?If any biopsies were taken you will be contacted by phone or by letter within the next 1-3 weeks.  Please call us at [phone] if you have not heard about the biopsies in 3 weeks.  ? ? ?SIGNATURES/CONFIDENTIALITY: ?You and/or your care partner have signed paperwork which will be entered into your electronic medical record.  These signatures attest to the fact that that the information above on your After Visit Summary has been reviewed and is understood.  Full responsibility  of the confidentiality of this discharge information lies with you and/or your care-partner. ? ? ? ? ? ?

## 2021-08-23 NOTE — Op Note (Signed)
[facility] ?Patient Name: Guy Morris ?Procedure Date: 08/23/2021 10:21 AM ?MRN: [id number] ?Endoscopist: Milus Banister , MD ?Age: 71 ?Referring MD:  ?Date of Birth: 08-01-1950 ?Gender: Male ?Account #: [id number] ?Procedure:                Colonoscopy ?Indications:              Abdominal pain in the left lower quadrant, change  ?                          in bowel habits and Personal history of adenomatous  ?                          colon polyps.????Multiple adenomatous polyps in 2009,  ?                          2010 (including one tubular adenoma with a small  ?                          focus of high-grade dysplasia in 2009).  ?                          Colonoscopy 12/2013 3 subcentimeter adenomas.  ?                          Colonoscopy August 2018 diverticulosis throughout  ?                          the colon, single subcentimeter adenoma was found  ?                          and removed. ?Medicines:                Monitored Anesthesia Care ?Procedure:                Pre-Anesthesia Assessment: ?                          - Prior to the procedure, a History and Physical  ?                          was performed, and patient medications and  ?                          allergies were reviewed. The patient's tolerance of  ?                          previous anesthesia was also reviewed. The risks  ?                          and benefits of the procedure and the sedation  ?                          options and risks were discussed with the patient.  ?  All questions were answered, and informed consent  ?                          was obtained. Prior Anticoagulants: The patient has  ?                          taken no previous anticoagulant or antiplatelet  ?                          agents. ASA Grade Assessment: II - A patient with  ?                          mild systemic disease. After reviewing the risks  ?                          and benefits, the patient was deemed in  ?                           satisfactory condition to undergo the procedure. ?                          After obtaining informed consent, the colonoscope  ?                          was passed under direct vision. Throughout the  ?                          procedure, the patient's blood pressure, pulse, and  ?                          oxygen saturations were monitored continuously. The  ?                          CF [id number] #[id number] was introduced through the anus  ?                          and advanced to the the cecum, identified by  ?                          appendiceal orifice and ileocecal valve. The  ?                          colonoscopy was performed without difficulty. The  ?                          patient tolerated the procedure well. The quality  ?                          of the bowel preparation was good. The ileocecal  ?                          valve, appendiceal orifice, and rectum were  ?  photographed. ?Scope In: 10:30:31 AM ?Scope Out: 10:47:01 AM ?Scope Withdrawal Time: 0 hours 13 minutes 37 seconds  ?Total Procedure Duration: 0 hours 16 minutes 30 seconds  ?Findings:                 A 4 mm polyp was found in the sigmoid colon. The  ?                          polyp was sessile. The polyp was removed with a  ?                          cold snare. Resection and retrieval were complete. ?                          Multiple small and large-mouthed diverticula were  ?                          found in the entire colon. ?                          The exam was otherwise without abnormality on  ?                          direct and retroflexion views. ?Complications:            No immediate complications. Estimated blood loss:  ?                          None. ?Estimated Blood Loss:     Estimated blood loss: none. ?Impression:               - One 4 mm polyp in the sigmoid colon, removed with  ?                          a cold snare. Resected and retrieved. ?                          -  Diverticulosis in the entire examined colon. ?                          - The examination was otherwise normal on direct  ?                          and retroflexion views. ?Recommendation:           - Patient has a contact number available for  ?                          emergencies. The signs and symptoms of potential  ?                          delayed complications were discussed with the  ?                          patient. Return to normal activities tomorrow.  ?  Written discharge instructions were provided to the  ?                          patient. ?                          - Resume previous diet. ?                          - Continue present medications. Continue twice  ?                          daily fiber supplement, this seems to be really  ?                          helping your bowels. ?                          - Await pathology results. ?Milus Banister, MD ?08/23/2021 10:51:14 AM ?This report has been signed electronically. ?

## 2021-08-25 ENCOUNTER — Telehealth: Payer: Self-pay

## 2021-08-25 NOTE — Telephone Encounter (Signed)
?  Follow up Call- ? ?Call back number 08/23/2021  ?Post procedure Call Back phone  # [phone]  ?Permission to leave phone message Yes  ?Some recent data might be hidden  ?  ? ?Patient questions: ? ?Do you have a fever, pain , or abdominal swelling? No. ?Pain Score  0 * ? ?Have you tolerated food without any problems? Yes.   ? ?Have you been able to return to your normal activities? Yes.   ? ?Do you have any questions about your discharge instructions: ?Diet   No. ?Medications  No. ?Follow up visit  No. ? ?Do you have questions or concerns about your Care? No. ? ?Actions: ?* If pain score is 4 or above: ?No action needed, pain <4. ? ? ?

## 2021-08-26 ENCOUNTER — Other Ambulatory Visit: Payer: Self-pay | Admitting: Internal Medicine

## 2021-08-29 ENCOUNTER — Encounter: Payer: Self-pay | Admitting: Gastroenterology

## 2021-08-31 ENCOUNTER — Ambulatory Visit: Payer: Medicare Other | Admitting: Internal Medicine

## 2021-09-12 ENCOUNTER — Encounter: Payer: Self-pay | Admitting: Internal Medicine

## 2021-09-12 ENCOUNTER — Ambulatory Visit (INDEPENDENT_AMBULATORY_CARE_PROVIDER_SITE_OTHER): Payer: Medicare Other | Admitting: Internal Medicine

## 2021-09-12 VITALS — BP 116/68 | HR 42 | Temp 97.7°F | Ht 68.0 in | Wt 175.0 lb

## 2021-09-12 DIAGNOSIS — F32A Depression, unspecified: Secondary | ICD-10-CM

## 2021-09-12 DIAGNOSIS — E559 Vitamin D deficiency, unspecified: Secondary | ICD-10-CM | POA: Diagnosis not present

## 2021-09-12 DIAGNOSIS — E7800 Pure hypercholesterolemia, unspecified: Secondary | ICD-10-CM

## 2021-09-12 DIAGNOSIS — R739 Hyperglycemia, unspecified: Secondary | ICD-10-CM

## 2021-09-12 DIAGNOSIS — E538 Deficiency of other specified B group vitamins: Secondary | ICD-10-CM

## 2021-09-12 DIAGNOSIS — N320 Bladder-neck obstruction: Secondary | ICD-10-CM

## 2021-09-12 DIAGNOSIS — E039 Hypothyroidism, unspecified: Secondary | ICD-10-CM | POA: Diagnosis not present

## 2021-09-12 DIAGNOSIS — R42 Dizziness and giddiness: Secondary | ICD-10-CM

## 2021-09-12 LAB — LIPID PANEL
Cholesterol: 223 mg/dL — ABNORMAL HIGH (ref 0–200)
HDL: 50.3 mg/dL (ref >39.00)
NonHDL: 172.21
Total CHOL/HDL Ratio: 4
Triglycerides: 213 mg/dL — ABNORMAL HIGH (ref 0.0–149.0)
VLDL: 42.6 mg/dL — ABNORMAL HIGH (ref 0.0–40.0)

## 2021-09-12 LAB — CBC WITH DIFFERENTIAL/PLATELET
Basophils Absolute: 0 10*3/uL (ref 0.0–0.1)
Basophils Relative: 0.7 % (ref 0.0–3.0)
Eosinophils Absolute: 0.1 10*3/uL (ref 0.0–0.7)
Eosinophils Relative: 1.8 % (ref 0.0–5.0)
HCT: 47.5 % (ref 39.0–52.0)
Hemoglobin: 16 g/dL (ref 13.0–17.0)
Lymphocytes Relative: 34.7 % (ref 12.0–46.0)
Lymphs Abs: 2.3 10*3/uL (ref 0.7–4.0)
MCHC: 33.6 g/dL (ref 30.0–36.0)
MCV: 91.9 fl (ref 78.0–100.0)
Monocytes Absolute: 0.7 10*3/uL (ref 0.1–1.0)
Monocytes Relative: 10.5 % (ref 3.0–12.0)
Neutro Abs: 3.5 10*3/uL (ref 1.4–7.7)
Neutrophils Relative %: 52.3 % (ref 43.0–77.0)
Platelets: 251 10*3/uL (ref 150.0–400.0)
RBC: 5.17 Mil/uL (ref 4.22–5.81)
RDW: 12.8 % (ref 11.5–15.5)
WBC: 6.7 10*3/uL (ref 4.0–10.5)

## 2021-09-12 LAB — HEPATIC FUNCTION PANEL
ALT: 12 U/L (ref 0–53)
AST: 17 U/L (ref 0–37)
Albumin: 4.8 g/dL (ref 3.5–5.2)
Alkaline Phosphatase: 82 U/L (ref 39–117)
Bilirubin, Direct: 0.1 mg/dL (ref 0.0–0.3)
Total Bilirubin: 0.7 mg/dL (ref 0.2–1.2)
Total Protein: 7 g/dL (ref 6.0–8.3)

## 2021-09-12 LAB — URINALYSIS, ROUTINE W REFLEX MICROSCOPIC
Bilirubin Urine: NEGATIVE
Hgb urine dipstick: NEGATIVE
Ketones, ur: NEGATIVE
Leukocytes,Ua: NEGATIVE
Nitrite: NEGATIVE
RBC / HPF: NONE SEEN (ref 0–?)
Specific Gravity, Urine: <=1.005 — AB (ref 1.000–1.030)
Total Protein, Urine: NEGATIVE
Urine Glucose: NEGATIVE
Urobilinogen, UA: 0.2 (ref 0.0–1.0)
WBC, UA: NONE SEEN (ref 0–?)
pH: 5.5 (ref 5.0–8.0)

## 2021-09-12 LAB — BASIC METABOLIC PANEL
BUN: 13 mg/dL (ref 6–23)
CO2: 27 mEq/L (ref 19–32)
Calcium: 10.1 mg/dL (ref 8.4–10.5)
Chloride: 101 mEq/L (ref 96–112)
Creatinine, Ser: 0.86 mg/dL (ref 0.40–1.50)
GFR: 88.07 mL/min (ref >60.00)
Glucose, Bld: 95 mg/dL (ref 70–99)
Potassium: 4.2 mEq/L (ref 3.5–5.1)
Sodium: 136 mEq/L (ref 135–145)

## 2021-09-12 LAB — VITAMIN B12: Vitamin B-12: 688 pg/mL (ref 211–911)

## 2021-09-12 LAB — VITAMIN D 25 HYDROXY (VIT D DEFICIENCY, FRACTURES): VITD: 32.05 ng/mL (ref 30.00–100.00)

## 2021-09-12 LAB — PSA: PSA: 0.86 ng/mL (ref 0.10–4.00)

## 2021-09-12 LAB — LDL CHOLESTEROL, DIRECT: Direct LDL: 148 mg/dL

## 2021-09-12 LAB — TSH: TSH: 11.36 u[IU]/mL — ABNORMAL HIGH (ref 0.35–5.50)

## 2021-09-12 LAB — HEMOGLOBIN A1C: Hgb A1c MFr Bld: 6.1 % (ref 4.6–6.5)

## 2021-09-12 MED ORDER — CITALOPRAM HYDROBROMIDE 10 MG PO TABS: 10.0000 mg | ORAL_TABLET | Freq: Every day | ORAL | 3 refills | Status: DC

## 2021-09-12 NOTE — Patient Instructions (Signed)
Please take all new medication as prescribed - the celexa 10 mg for low mood ? ?Please continue all other medications as before, and refills have been done if requested. ? ?Please have the pharmacy call with any other refills you may need. ? ?Please continue your efforts at being more active, low cholesterol diet, and weight control. ? ?You are otherwise up to date with prevention measures today. ? ?Please keep your appointments with your specialists as you may have planned ? ?Please go to the LAB at the blood drawing area for the tests to be done ? ?You will be contacted by phone if any changes need to be made immediately.  Otherwise, you will receive a letter about your results with an explanation, but please check with MyChart first. ? ?Please remember to sign up for MyChart if you have not done so, as this will be important to you in the future with finding out test results, communicating by private email, and scheduling acute appointments online when needed. ? ?Please make an Appointment to return in 6 months, or sooner if needed ?
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
## 2021-09-12 NOTE — Assessment & Plan Note (Signed)
Last vitamin D ?Lab Results  ?Component Value Date  ? VD25OH 28.67 (L) 08/25/2020  ? ?Low, to start oral replacement ? ?

## 2021-09-17 ENCOUNTER — Encounter: Payer: Self-pay | Admitting: Internal Medicine

## 2021-09-17 NOTE — Assessment & Plan Note (Signed)
Lab Results  ?Component Value Date  ? TSH 11.36 (H) 09/12/2021  ? ?uncontroed, pt to increase levothyroxine, for f/u lab next visit ?

## 2021-09-17 NOTE — Assessment & Plan Note (Signed)
Asympt, for psa f/u ?

## 2021-09-17 NOTE — Assessment & Plan Note (Signed)
Chronic stable persistent intermitent, for meclizine prn ?

## 2021-09-17 NOTE — Assessment & Plan Note (Addendum)
Recent onset mild to mod, for celexa 10 qd,  to f/u any worsening symptoms or concerns, declines counseling referral or psychiatry ?

## 2021-09-17 NOTE — Assessment & Plan Note (Signed)
Lab Results  ?Component Value Date  ? LDLCALC 140 (H) 04/27/2020  ? ?Uncontrolled, pt to continue current low chol diet, has been statin intolerant ? ?

## 2021-09-17 NOTE — Assessment & Plan Note (Signed)
Lab Results  ?Component Value Date  ? HGBA1C 6.1 09/12/2021  ? ?Stable, pt to continue current medical treatment  - diet ? ?

## 2021-09-17 NOTE — Assessment & Plan Note (Signed)
Lab Results  ?Component Value Date  ? [id number] 688 09/12/2021  ? ?Stable, cont oral replacement - b12 1000 mcg qd ? ?

## 2021-09-29 ENCOUNTER — Other Ambulatory Visit: Payer: Self-pay | Admitting: Cardiology
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
## 2021-10-12 ENCOUNTER — Ambulatory Visit (INDEPENDENT_AMBULATORY_CARE_PROVIDER_SITE_OTHER): Payer: Medicare Other | Admitting: Cardiology

## 2021-10-12 ENCOUNTER — Encounter: Payer: Self-pay | Admitting: Cardiology

## 2021-10-12 VITALS — BP 134/72 | HR 48 | Ht 67.0 in | Wt 179.6 lb

## 2021-10-12 DIAGNOSIS — M79605 Pain in left leg: Secondary | ICD-10-CM

## 2021-10-12 DIAGNOSIS — I6523 Occlusion and stenosis of bilateral carotid arteries: Secondary | ICD-10-CM

## 2021-10-12 DIAGNOSIS — I480 Paroxysmal atrial fibrillation: Secondary | ICD-10-CM

## 2021-10-12 DIAGNOSIS — I2510 Atherosclerotic heart disease of native coronary artery without angina pectoris: Secondary | ICD-10-CM

## 2021-10-12 DIAGNOSIS — E785 Hyperlipidemia, unspecified: Secondary | ICD-10-CM

## 2021-10-12 NOTE — Patient Instructions (Addendum)
Medication Instructions:  ?Take Metoprolol 50 mg 1/2 tablet twice a day  ?Continue all other medications ?*If you need a refill on your cardiac medications before your next appointment, please call your pharmacy* ? ? ?Lab Work: ?None ordered ? ? ?Testing/Procedures: ?Schedule Lower ext arterial dopplers with ABI's ? ? ?Follow-Up: ?At [facility], you and your health needs are our priority.  As part of our continuing mission to provide you with exceptional heart care, we have created designated Provider Care Teams.  These Care Teams include your primary Cardiologist (physician) and Advanced Practice Providers (APPs -  Physician Assistants and Nurse Practitioners) who all work together to provide you with the care you need, when you need it. ? ?We recommend signing up for the patient portal called "MyChart".  Sign up information is provided on this After Visit Summary.  MyChart is used to connect with patients for Virtual Visits (Telemedicine).  Patients are able to view lab/test results, encounter notes, upcoming appointments, etc.  Non-urgent messages can be sent to your provider as well.   ?To learn more about what you can do with MyChart, go to [email or web address].   ? ?Your next appointment:  1 year ?  ? ?The format for your next appointment: Office ? ? ?Provider:  Dr.Hochrein ? ? ?Important Information About Sugar ? ? ? ? ? ? ?

## 2021-11-07 ENCOUNTER — Ambulatory Visit (HOSPITAL_COMMUNITY)
Admission: RE | Admit: 2021-11-07 | Discharge: 2021-11-07 | Disposition: A | Discharge status: Home / Self Care | Payer: Medicare Other | Source: Ambulatory Visit | Attending: Cardiology | Admitting: Cardiology

## 2021-11-07 DIAGNOSIS — I6523 Occlusion and stenosis of bilateral carotid arteries: Secondary | ICD-10-CM | POA: Insufficient documentation

## 2021-11-07 DIAGNOSIS — I2510 Atherosclerotic heart disease of native coronary artery without angina pectoris: Secondary | ICD-10-CM | POA: Insufficient documentation

## 2021-11-07 DIAGNOSIS — M79605 Pain in left leg: Secondary | ICD-10-CM | POA: Diagnosis not present

## 2021-11-07 DIAGNOSIS — I480 Paroxysmal atrial fibrillation: Secondary | ICD-10-CM | POA: Insufficient documentation

## 2021-11-07 DIAGNOSIS — E785 Hyperlipidemia, unspecified: Secondary | ICD-10-CM | POA: Insufficient documentation

## 2021-11-08 ENCOUNTER — Telehealth: Payer: Self-pay | Admitting: Internal Medicine

## 2021-11-08 DIAGNOSIS — I739 Peripheral vascular disease, unspecified: Secondary | ICD-10-CM

## 2021-11-08 NOTE — Telephone Encounter (Addendum)
Ok to contact pt  I saw the leg circulation test result I believe ordered per Dr Percival Spanish, and I took the liberty of referral to [facility] - hope that is ok, and hope he hears soon

## 2021-11-08 NOTE — Progress Notes (Signed)
I referred to Guy Morris

## 2021-11-10 NOTE — Telephone Encounter (Signed)
Patient's wife notified and okay with referral

## 2021-11-20 IMAGING — DX DG CHEST 2V
2 series · 2 of 2 positions shown · non-contrast
Comparison: 02/18/2020.

CLINICAL DATA: Preoperative study for heart surgery.

EXAM:
CHEST - 2 VIEW

[w chest pa]
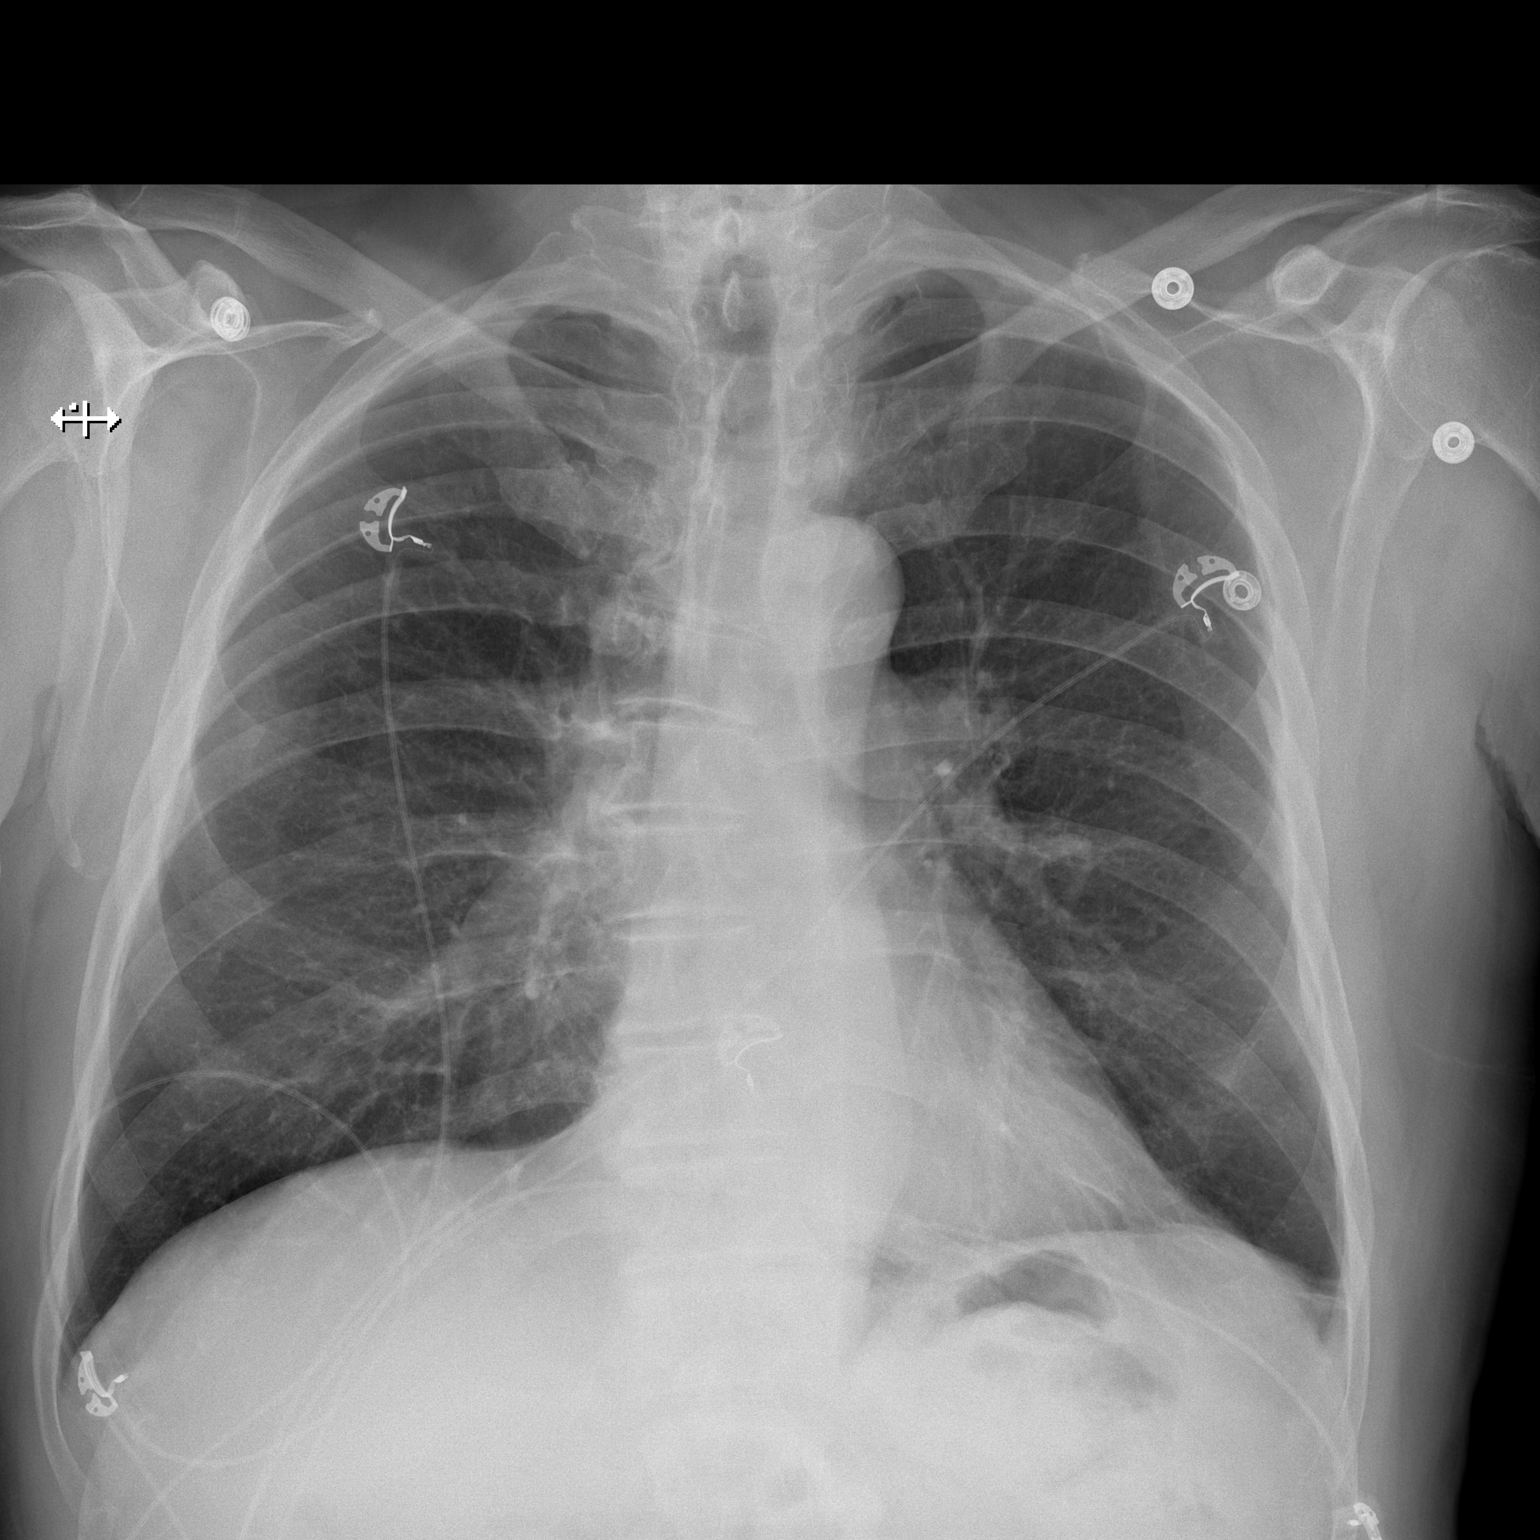

[w chest lat]
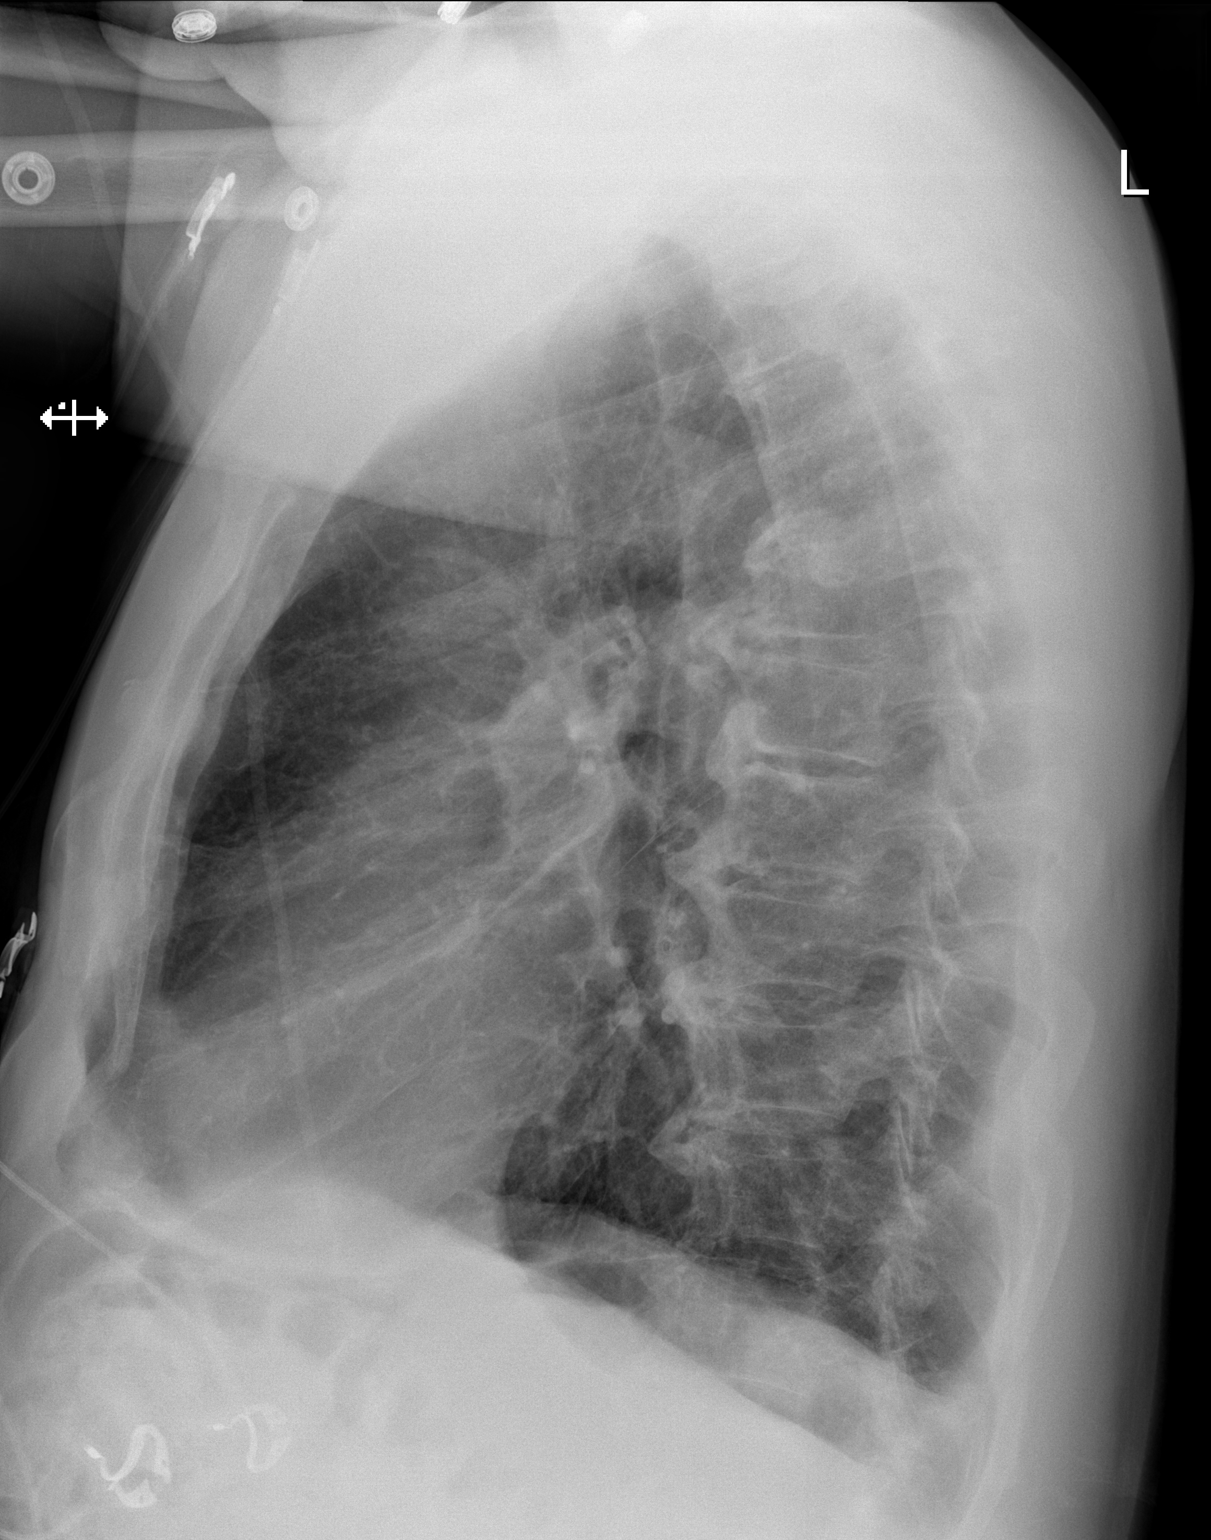

[2 of 2 positions shown; findings below may reference images not displayed]

FINDINGS: Mediastinum and hilar structures normal. Heart size normal. No focal
infiltrate. Mild left base subsegmental atelectasis and or scarring
again noted. No prominent pleural effusion. No pneumothorax.
Thoracic spine scoliosis and degenerative change.
IMPRESSION: No acute cardiopulmonary disease.

## 2021-11-22 IMAGING — CR DG CHEST 1V PORT
1 series · 1 of 1 positions shown · non-contrast
Comparison: 04/26/2020

CLINICAL DATA: Postoperative CABG.

EXAM:
PORTABLE CHEST 1 VIEW

[AP]
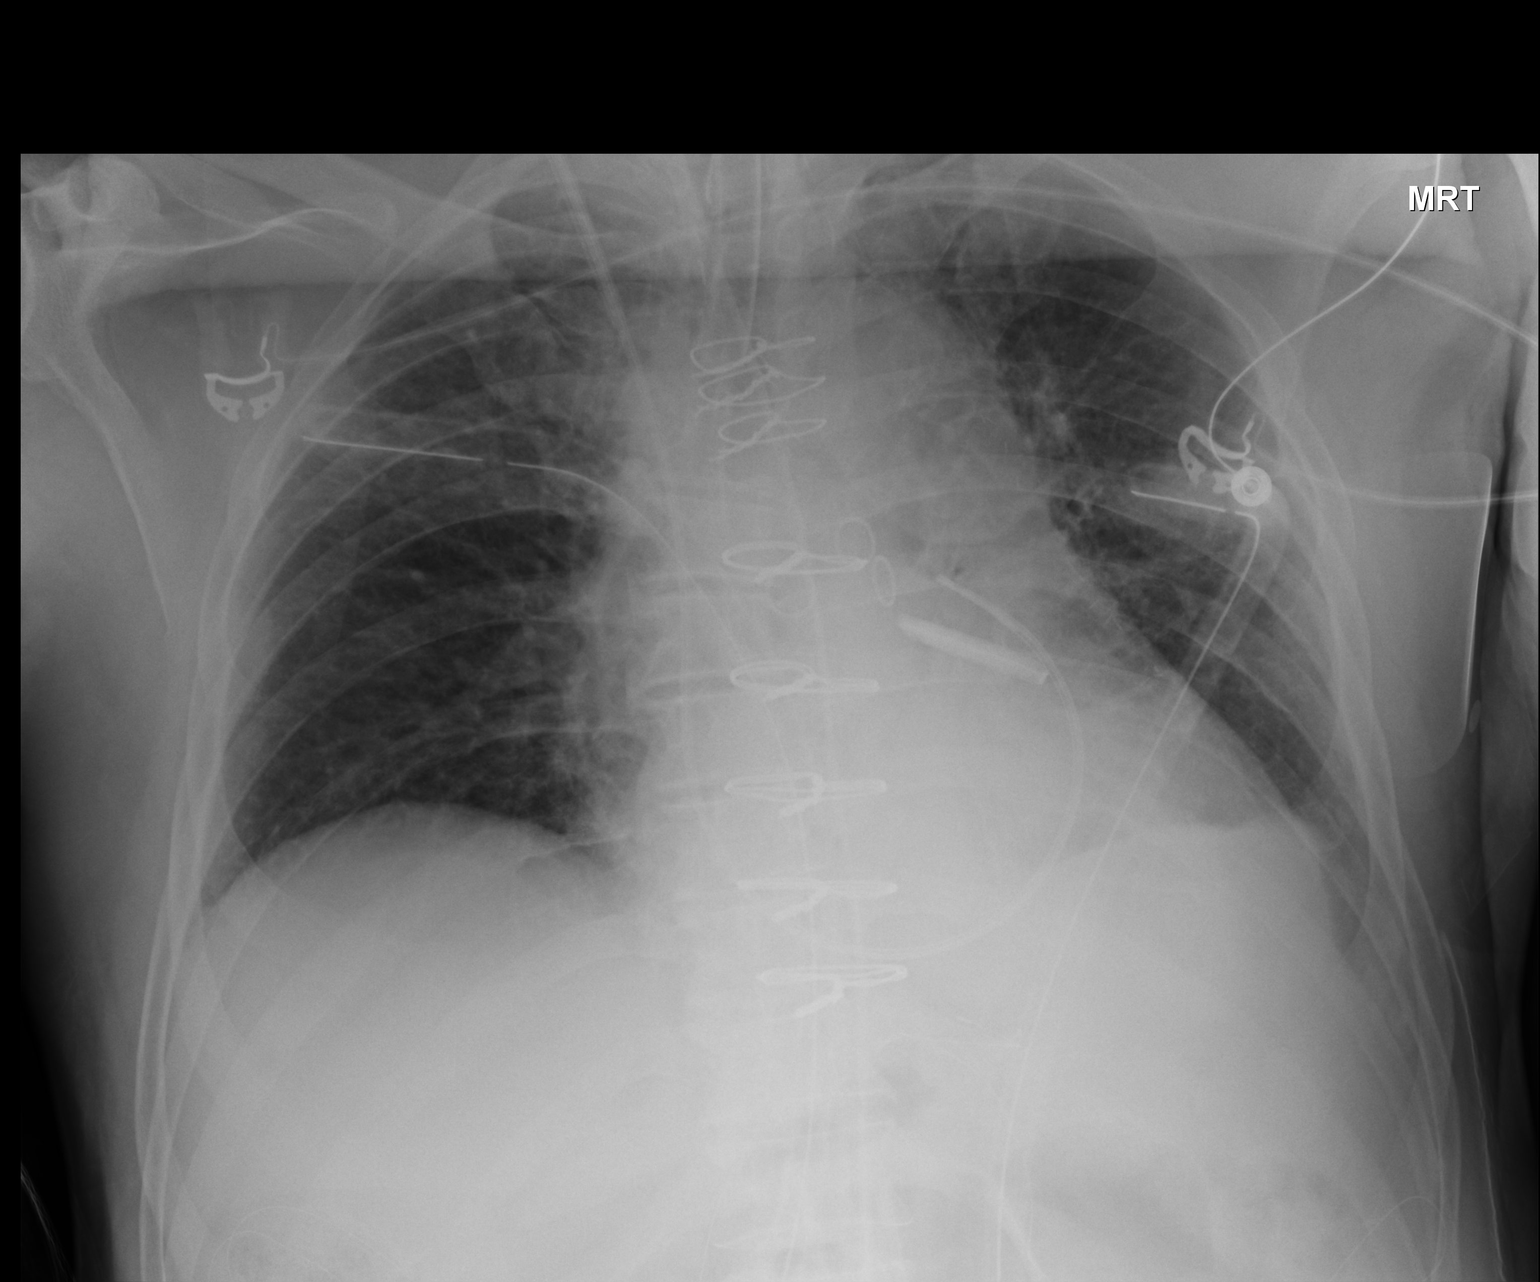

[1 of 1 positions shown; findings below may reference images not displayed]

FINDINGS: Interval post CABG changes to the chest including left atrial
appendage clipping and median sternotomy. Endotracheal tube
terminates approximately 2.3 cm above the carina. Right IJ sheath
with Swan-Ganz catheter terminating within the pulmonary outflow
tract. Bilateral chest tubes and mediastinal drain.

Slight widening of the superior mediastinum is likely accentuated by
AP supine technique. Mild atelectasis within the left mid lung. No
pleural effusion or pneumothorax.
IMPRESSION: 1. Status post CABG. No pneumothorax.
2. Support lines and tubes in appropriate position.
3. Slight widening of the superior mediastinum, likely accentuated
by AP supine technique.

## 2021-11-22 IMAGING — DX DG CHEST 1V PORT
1 series · 1 of 1 positions shown · non-contrast
Comparison: 04/28/2020

CLINICAL DATA: Postop

EXAM:
PORTABLE CHEST 1 VIEW

[chest ap]
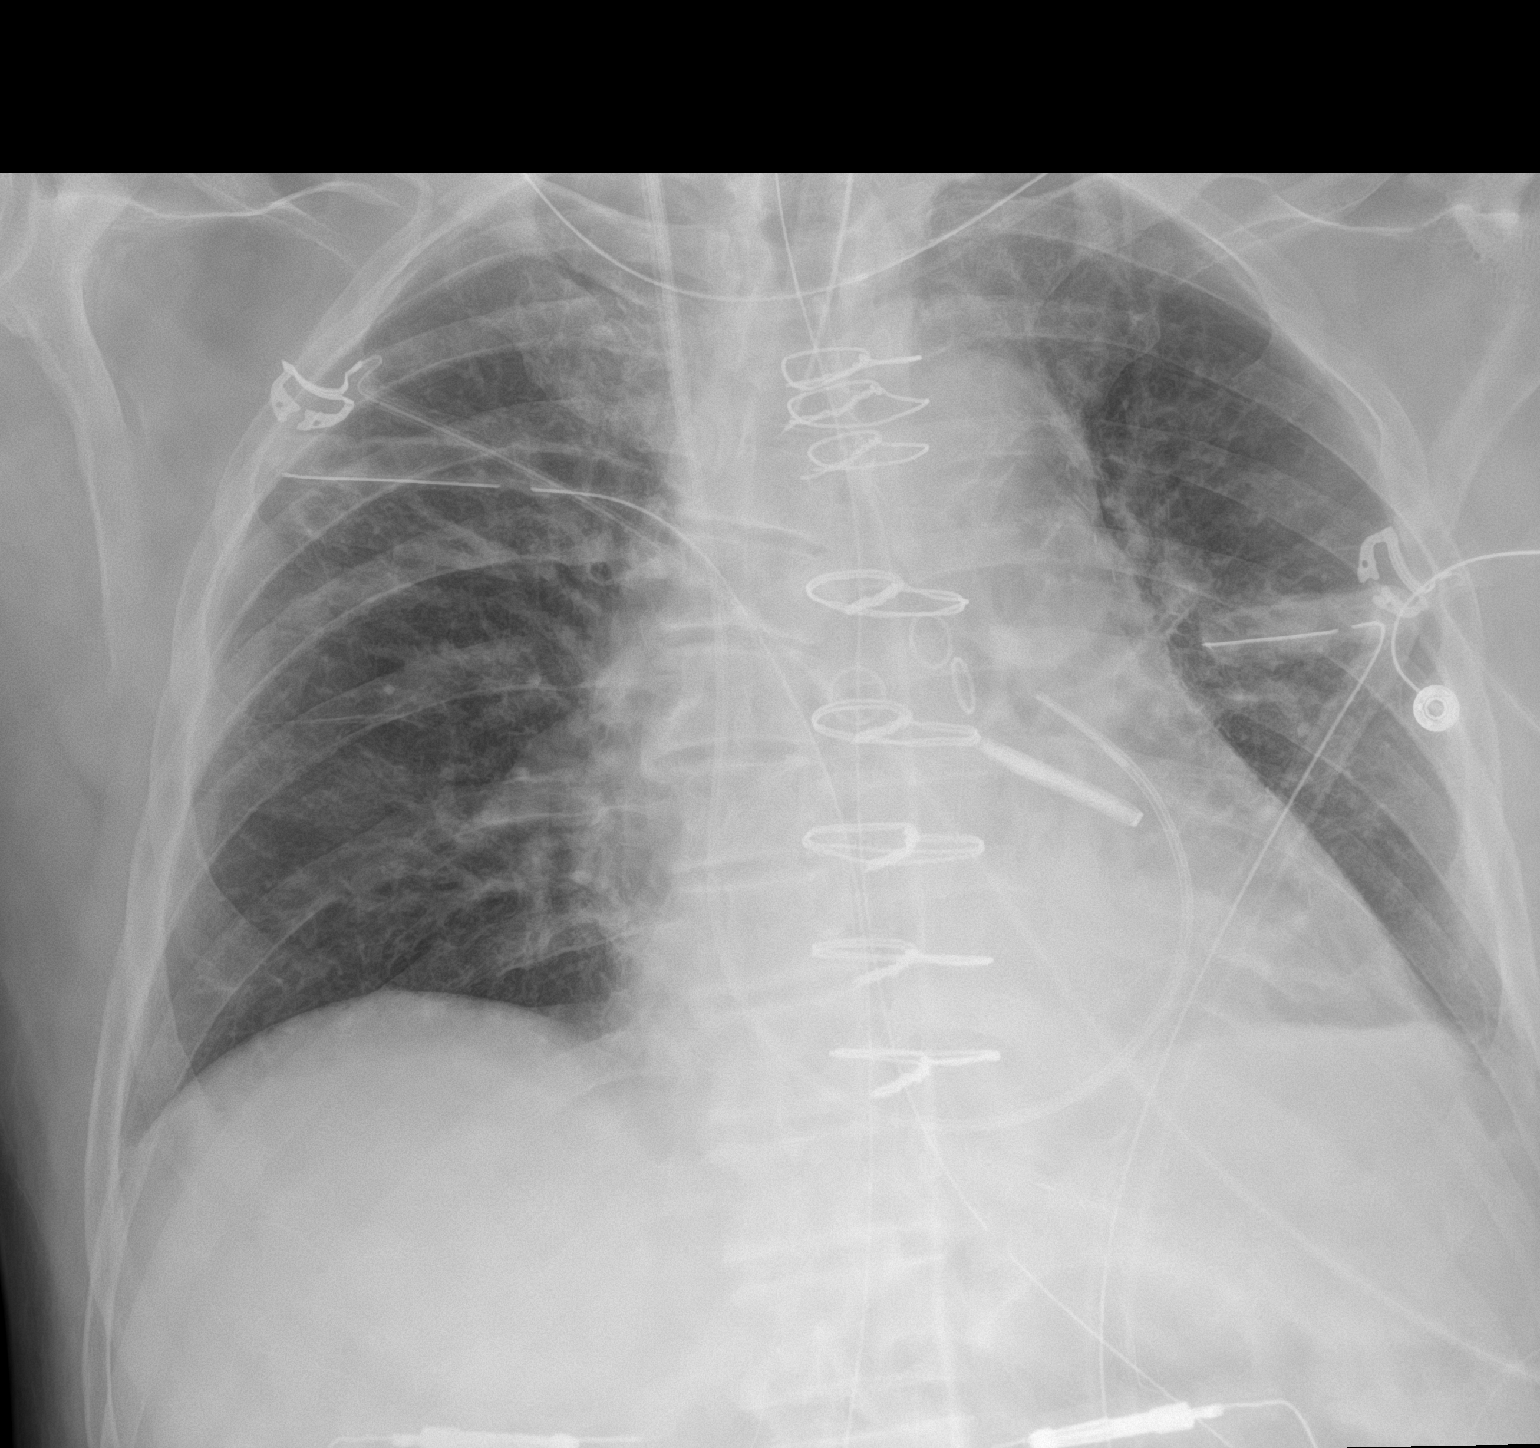

[1 of 1 positions shown; findings below may reference images not displayed]

FINDINGS: Endotracheal tube remains in good position. Swan-Ganz catheter in
the pulmonary outflow tract unchanged. Postop CABG. NG tube in the
stomach.

Bilateral chest tubes in good position. No pneumothorax or
significant pleural effusion. Mild left lower lobe atelectasis.
IMPRESSION: Support lines in satisfactory position. No pneumothorax. Mild left
lower lobe atelectasis.

## 2021-11-23 IMAGING — DX DG CHEST 1V PORT
1 series · 1 of 1 positions shown · non-contrast
Comparison: April 28, 2020.

CLINICAL DATA: Open heart surgery.  Chest tube present.

EXAM:
PORTABLE CHEST 1 VIEW

[chest]
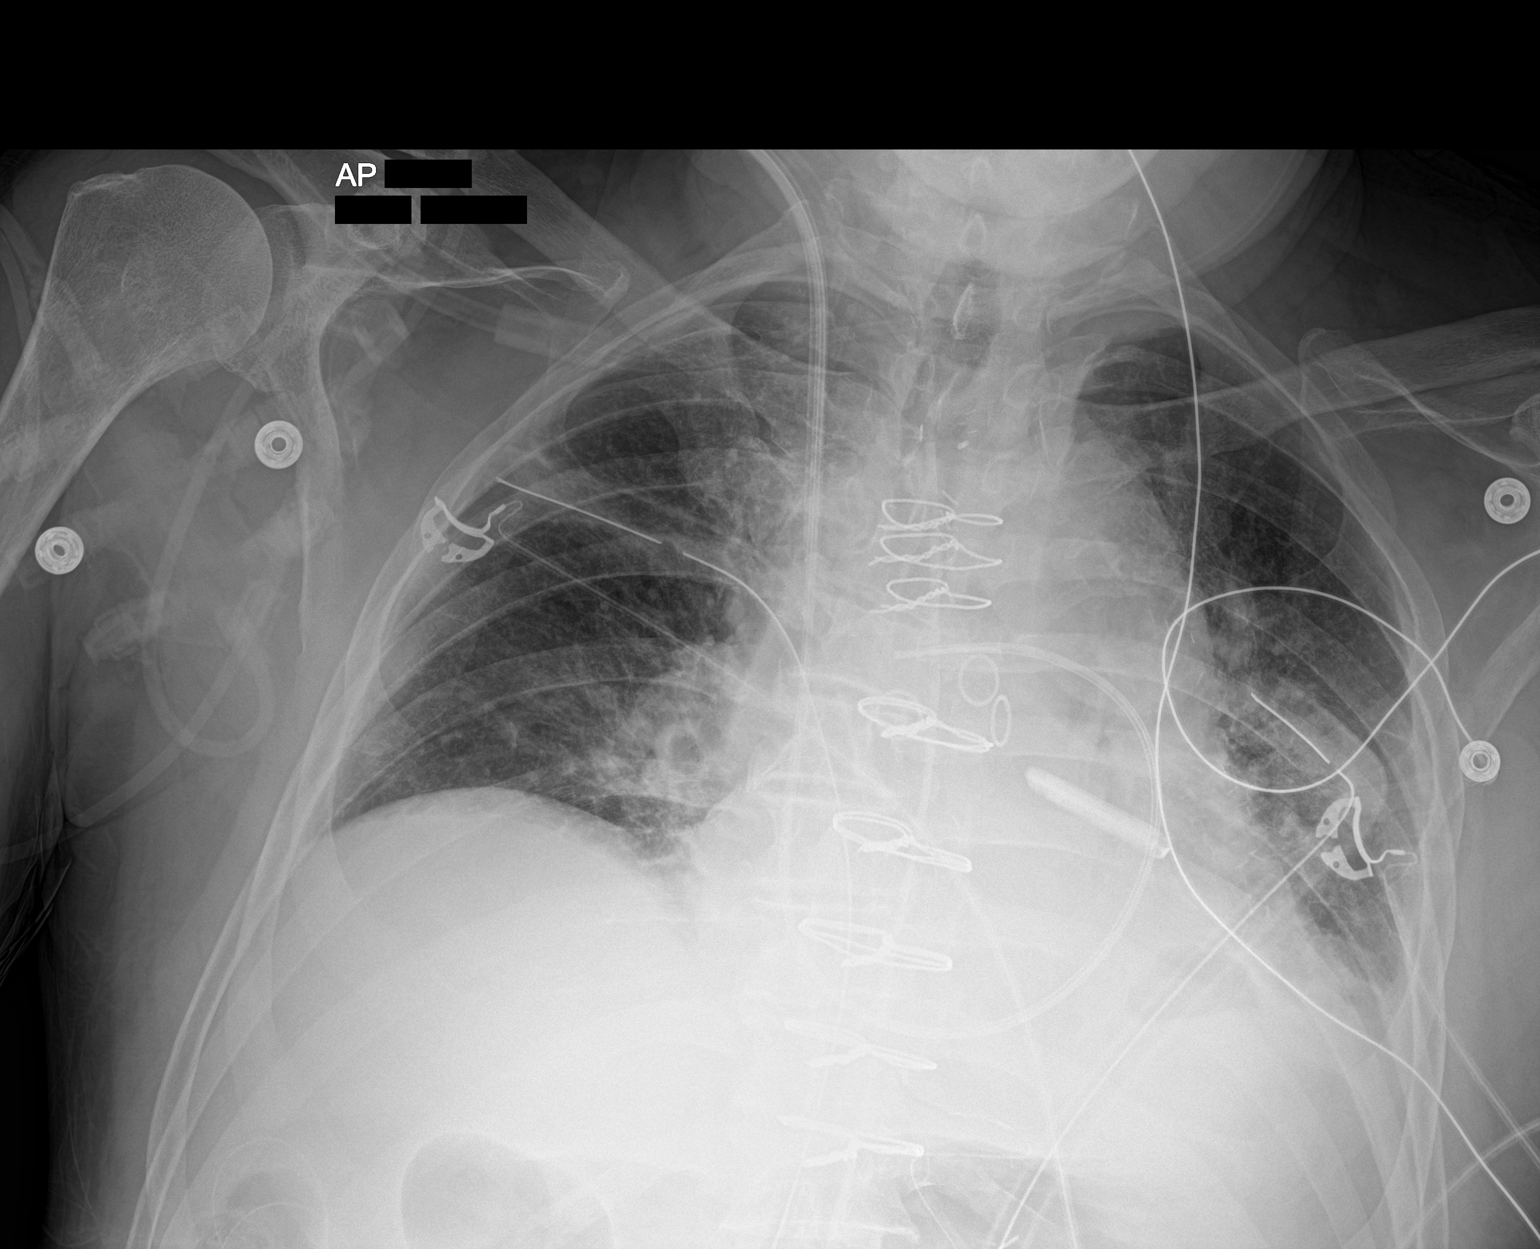

[1 of 1 positions shown; findings below may reference images not displayed]

FINDINGS: Postoperative changes of CABG with median sternotomy. Similar
cardiomediastinal silhouette, accentuated by AP technique. Interval
extubation. Similar position of a Swan-Ganz catheter, mediastinal
drain, and bilateral chest tubes. Low lung volumes with left basilar
atelectasis. Small left pleural effusion. No visible pneumothorax.
IMPRESSION: 1. Interval extubation. Additional support devices are in similar
position.
2. Low lung volumes with small left pleural effusion. Overlying
opacity, most likely atelectasis in the postoperative setting.

## 2021-12-06 ENCOUNTER — Encounter: Payer: Self-pay | Admitting: Vascular Surgery

## 2021-12-06 ENCOUNTER — Ambulatory Visit (INDEPENDENT_AMBULATORY_CARE_PROVIDER_SITE_OTHER): Payer: Medicare Other | Admitting: Vascular Surgery

## 2021-12-06 VITALS — BP 130/75 | HR 54 | Temp 97.0°F | Resp 14 | Ht 67.0 in | Wt 175.2 lb

## 2021-12-06 DIAGNOSIS — I6523 Occlusion and stenosis of bilateral carotid arteries: Secondary | ICD-10-CM

## 2021-12-06 DIAGNOSIS — I739 Peripheral vascular disease, unspecified: Secondary | ICD-10-CM | POA: Diagnosis not present

## 2021-12-06 NOTE — Progress Notes (Signed)
Vascular and Vein Specialist of [place]  Patient name: Guy Morris MRN: [id number] DOB: 10/24/1950 Sex: male  REASON FOR CONSULT: Discuss left leg SFA occlusive disease  HPI: Guy Morris is a 71 y.o. male, who is here today with his wife for discussion of abnormal arterial flow to his left foot.  He has a known history of coronary artery disease and also moderate carotid disease.  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  He does have difficulty with arthritis in his knees and this is the main limitation for him.  He does not have any calf claudication type symptoms.  He has no history of lower extremity tissue loss.  Past Medical History:  Diagnosis Date   ABNORMAL TRANSAMINASE-LFT'S 08/04/2008   Qualifier: Diagnosis of  By: Fuller Plan MD Lamont Snowball T    ALLERGIC RHINITIS 05/06/2007   Qualifier: Diagnosis of  By: Jenny Reichmann MD, Hunt Oris    Allergy    Atrial fibrillation [facility])    BPH (benign prostatic hyperplasia) 04/28/2012   COPD (chronic obstructive pulmonary disease) ([place])    Coronary artery disease with history of myocardial infarction without history of CABG 04/11/2021   Degenerative joint disease 04/23/2011   ERECTILE DYSFUNCTION 05/06/2007   Qualifier: Diagnosis of  By: Jenny Reichmann MD, Hunt Oris    GERD 05/06/2007   Qualifier: Diagnosis of  By: Jenny Reichmann MD, Hunt Oris    HYPERLIPIDEMIA 05/06/2007   Qualifier: Diagnosis of  By: Jenny Reichmann MD, [place] 05/06/2007   Qualifier: Diagnosis of  By: Jenny Reichmann MD, Hunt Oris    Insomnia 04/28/2012   OSTEOARTHRITIS, KNEE, 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   Qualifier: Diagnosis of  By: Jenny Reichmann MD, Hunt Oris    Personal history of colonic polyps 07/20/2008   Centricity Description: PERSONAL HX COLONIC POLYPS Qualifier: Diagnosis of  By: Ardis Hughs MD, Melene Plan  Centricity Description: COLONIC POLYPS, HX OF Qualifier: Diagnosis of  By: Jenny Reichmann MD, Hunt Oris     RLS (restless legs syndrome) 10/17/2010   Urge incontinence 04/28/2012   vesicare heps per urology    Family History  Problem Relation Age of Onset   Cancer Mother        Breast Cancer   Arthritis Mother        RA   Cancer Father        Lung Cancer   Esophageal cancer Father    Pancreatic cancer Maternal Uncle    Stomach cancer Sister    Colon cancer Neg Hx    Colon polyps Neg Hx    Rectal cancer Neg Hx     SOCIAL HISTORY: Social History   Socioeconomic History   Marital status: Married    Spouse name: Mardene Celeste   Number of children: 3   Years of education: Not on file   Highest education level: 8th grade  Occupational History   Occupation: disabled - DJD knees, neck and shoulders - on SSI  Tobacco Use   Smoking status: Former    Packs/day: 2.00    Years: 37.00    Total pack years: 74.00    Types: Cigarettes    Quit date: 06/11/2000    Years since quitting: 21.5   Smokeless tobacco: Never   Tobacco comments:    quit smoking cigarettes 2002 and quit cigars 2016  Vaping Use   Vaping Use: Never used  Substance and Sexual Activity   Alcohol use: No  Drug use: No   Sexual activity: Not on file  Other Topics Concern   Not on file  Social History Narrative   Patient is right-handed. He lives with his wife in a one level home. He remains active around the home.   Social Determinants of Health   Financial Resource Strain: Low Risk  (05/02/2021)   Overall Financial Resource Strain (CARDIA)    Difficulty of Paying Living Expenses: Not hard at all  Food Insecurity: No Food Insecurity (05/02/2021)   Hunger Vital Sign    Worried About Running Out of Food in the Last Year: Never true    Ran Out of Food in the Last Year: Never true  Transportation Needs: No Transportation Needs (05/02/2021)   PRAPARE - Hydrologist (Medical): No    Lack of Transportation (Non-Medical): No  Physical Activity: Sufficiently Active (05/02/2021)   Exercise  Vital Sign    Days of Exercise per Week: 5 days    Minutes of Exercise per Session: 30 min  Stress: No Stress Concern Present (05/02/2021)   [place]    Feeling of Stress : Not at all  Social Connections: [place] (05/02/2021)   Social Connection and Isolation Panel [NHANES]    Frequency of Communication with Friends and Family: More than three times a week    Frequency of Social Gatherings with Friends and Family: More than three times a week    Attends Religious Services: More than 4 times per year    Active Member of [employer] or Organizations: Yes    Attends Music therapist: More than 4 times per year    Marital Status: Married  Human resources officer Violence: Not At Risk (05/02/2021)   Humiliation, Afraid, Rape, and Kick questionnaire    Fear of Current or Ex-Partner: No    Emotionally Abused: No    Physically Abused: No    Sexually Abused: No    Allergies  Allergen Reactions   Atorvastatin     myalgia   Claritin [Loratadine]     dizzy   Rofecoxib Itching   Rosuvastatin     myalgia   Zocor [Simvastatin] Other (See Comments)    myalgia    Current Outpatient Medications  Medication Sig Dispense Refill   ascorbic acid (VITAMIN C) 500 MG tablet Take 500 mg by mouth daily.     aspirin EC 81 MG tablet Take 81 mg by mouth daily. Swallow whole.     Ca Carbonate-Mag Hydroxide (ROLAIDS PO) Take 1 tablet by mouth 3 (three) times daily as needed (heartburn).     Cholecalciferol (THERA-D 2000) 50 MCG (2000 UT) TABS 1 tab by mouth once daily 30 tablet 99   citalopram (CELEXA) 10 MG tablet Take 1 tablet (10 mg total) by mouth daily. 90 tablet 3   levothyroxine (SYNTHROID) 175 MCG tablet TAKE 1 TABLET BY MOUTH DAILY BEFORE BREAKFAST 90 tablet 1   meclizine (ANTIVERT) 12.5 MG tablet TAKE 1 TABLET BY MOUTH THREE TIMES DAILY AS NEEDED FOR DIZZINESS 30 tablet 0   metoprolol tartrate (LOPRESSOR) 50 MG  tablet Take 1/2 tablet ( 25 mg ) twice a day 180 tablet 3   nitroGLYCERIN (NITROSTAT) 0.4 MG SL tablet Place 1 tablet (0.4 mg total) under the tongue every 5 (five) minutes as needed for chest pain. 90 tablet 3   polyethylene glycol (MIRALAX / GLYCOLAX) 17 g packet Take 17 g by mouth daily. 14 each 0  Turmeric Curcumin 500 MG CAPS Take 1,000 mg by mouth daily.     vitamin B-12 (CYANOCOBALAMIN) 1000 MCG tablet Take 1 tablet (1,000 mcg total) by mouth daily. 90 tablet 1   No current facility-administered medications for this visit.    REVIEW OF SYSTEMS:  '[X]'$  denotes positive finding, '[ ]'$  denotes negative finding Cardiac  Comments:  Chest pain or chest pressure:    Shortness of breath upon exertion:    Short of breath when lying flat:    Irregular heart rhythm:        Vascular    Pain in calf, thigh, or hip brought on by ambulation: x   Pain in feet at night that wakes you up from your sleep:     Blood clot in your veins:    Leg swelling:         Pulmonary    Oxygen at home:    Productive cough:     Wheezing:         Neurologic    Sudden weakness in arms or legs:     Sudden numbness in arms or legs:     Sudden onset of difficulty speaking or slurred speech:    Temporary loss of vision in one eye:     Problems with dizziness:         Gastrointestinal    Blood in stool:     Vomited blood:         Genitourinary    Burning when urinating:     Blood in urine:        Psychiatric    Major depression:         Hematologic    Bleeding problems: x   Problems with blood clotting too easily:        Skin    Rashes or ulcers:        Constitutional    Fever or chills:      PHYSICAL EXAM: Vitals:   12/06/21 0935  BP: 130/75  Pulse: (!) 54  Resp: 14  Temp: (!) 97 F (36.1 C)  TempSrc: Temporal  SpO2: 95%  Weight: 175 lb 3.2 oz (79.5 kg)  Height: '5\' 7"'$  (1.702 m)    GENERAL: The patient is a well-nourished male, in no acute distress. The vital signs are documented  above. CARDIOVASCULAR: 2+ radial and 2+ femoral pulses bilaterally.  2+ right dorsalis pedis pulse.  Absent left popliteal and distal pulses. PULMONARY: There is good air exchange  MUSCULOSKELETAL: There are no major deformities or cyanosis. NEUROLOGIC: No focal weakness or paresthesias are detected. SKIN: There are no ulcers or rashes noted. PSYCHIATRIC: The patient has a normal affect.  DATA:  Noninvasive studies from 11/07/2021 reveal ankle arm index normal at 1.0 on the right and moderate abnormality at 0.75 on the left  Carotid duplex from 04/2021 revealed no evidence of stenosis in the right internal carotid artery with a 40 to 59% left internal carotid artery stenosis  MEDICAL ISSUES: Discussed these findings with the patient.  He does have evidence of SFA occlusion.  He is not having any claudication symptoms and no evidence of critical limb ischemia.  Explained presence of collateral flow which is proven running him from being severely symptomatic.  I recommend that he continue his walking program and that in all likelihood he will not require treatment for this.  He was reassured with this discussion and will see [place] again on an as-needed basis.  He will continue follow-up of his  moderate carotid disease with Dr. Merril Abbe, MD [facility] [phone] Pager [phone]  Note: Portions of this report may have been transcribed using voice recognition software.  Every effort has been made to ensure accuracy; however, inadvertent computerized transcription errors may still be present.

## 2021-12-25 ENCOUNTER — Other Ambulatory Visit: Payer: Self-pay | Admitting: Cardiology

## 2022-02-27 ENCOUNTER — Ambulatory Visit (INDEPENDENT_AMBULATORY_CARE_PROVIDER_SITE_OTHER): Payer: Medicare Other | Admitting: Internal Medicine

## 2022-02-27 VITALS — BP 126/70 | HR 42 | Temp 97.6°F | Ht 67.0 in | Wt 176.0 lb

## 2022-02-27 DIAGNOSIS — R030 Elevated blood-pressure reading, without diagnosis of hypertension: Secondary | ICD-10-CM | POA: Diagnosis not present

## 2022-02-27 DIAGNOSIS — R739 Hyperglycemia, unspecified: Secondary | ICD-10-CM

## 2022-02-27 DIAGNOSIS — L989 Disorder of the skin and subcutaneous tissue, unspecified: Secondary | ICD-10-CM

## 2022-02-27 DIAGNOSIS — E559 Vitamin D deficiency, unspecified: Secondary | ICD-10-CM

## 2022-02-27 DIAGNOSIS — I6523 Occlusion and stenosis of bilateral carotid arteries: Secondary | ICD-10-CM | POA: Diagnosis not present

## 2022-02-27 MED ORDER — LEVOTHYROXINE SODIUM 175 MCG PO TABS: 175.0000 ug | ORAL_TABLET | Freq: Every day | ORAL | 3 refills | Status: DC

## 2022-02-27 NOTE — Patient Instructions (Signed)
You will be contacted regarding the referral for: dermatology for the right arm skin lesion  Please continue all other medications as before, and refills have been done if requested.  Please have the pharmacy call with any other refills you may need.  Please continue your efforts at being more active, low cholesterol diet, and weight control.  Please keep your appointments with your specialists as you may have planned  Please make an Appointment to return in 6 months, or sooner if needed
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
## 2022-02-28 ENCOUNTER — Encounter: Payer: Self-pay | Admitting: Internal Medicine

## 2022-02-28 DIAGNOSIS — L989 Disorder of the skin and subcutaneous tissue, unspecified: Secondary | ICD-10-CM | POA: Insufficient documentation

## 2022-02-28 NOTE — Assessment & Plan Note (Signed)
BP Readings from Last 3 Encounters:  02/27/22 126/70  12/06/21 130/75  10/12/21 134/72   Stable, pt to continue medical treatment lopressor 25 bid

## 2022-02-28 NOTE — Assessment & Plan Note (Signed)
Last vitamin D Lab Results  Component Value Date   VD25OH 32.05 09/12/2021   remians low, to increase oral replacement to 2000 u qd

## 2022-02-28 NOTE — Assessment & Plan Note (Signed)
Non healing, suspicious for skin ca - for derm referral

## 2022-02-28 NOTE — Assessment & Plan Note (Signed)
Lab Results  Component Value Date   HGBA1C 6.1 09/12/2021   Stable, pt to continue current medical treatment  - diet, wt control, excercise

## 2022-03-06 DIAGNOSIS — Z23 Encounter for immunization: Secondary | ICD-10-CM | POA: Diagnosis not present

## 2022-04-08 ENCOUNTER — Other Ambulatory Visit: Payer: Self-pay | Admitting: Cardiology

## 2022-04-09 ENCOUNTER — Telehealth: Payer: Self-pay | Admitting: Cardiology

## 2022-04-09 MED ORDER — METOPROLOL TARTRATE 50 MG PO TABS: ORAL_TABLET | ORAL | 3 refills | Status: DC

## 2022-04-09 NOTE — Telephone Encounter (Signed)
Refill complete 

## 2022-04-09 NOTE — Telephone Encounter (Signed)
*  STAT* If patient is at the pharmacy, call can be transferred to refill team.   1. Which medications need to be refilled? (please list name of each medication and dose if known) metoprolol tartrate (LOPRESSOR) 50 MG tablet  2. Which pharmacy/location (including street and city if local pharmacy) is medication to be sent to? [place], [place] [address]  3. Do they need a 30 day or 90 day supply? [place]

## 2022-04-20 ENCOUNTER — Ambulatory Visit (HOSPITAL_COMMUNITY)
Admission: RE | Admit: 2022-04-20 | Discharge: 2022-04-20 | Disposition: A | Discharge status: Home / Self Care | Payer: Medicare Other | Source: Ambulatory Visit | Attending: Cardiology | Admitting: Cardiology

## 2022-04-20 DIAGNOSIS — I6523 Occlusion and stenosis of bilateral carotid arteries: Secondary | ICD-10-CM | POA: Insufficient documentation

## 2022-04-25 ENCOUNTER — Telehealth: Payer: Self-pay | Admitting: Internal Medicine

## 2022-04-25 NOTE — Telephone Encounter (Signed)
Left message for patient to call back to schedule Medicare Annual Wellness Visit   Last AWV  05/02/21  Please schedule at anytime with [facility] if patient calls the office back.     Any questions, please call me at [phone]

## 2022-04-30 DIAGNOSIS — D225 Melanocytic nevi of trunk: Secondary | ICD-10-CM | POA: Diagnosis not present

## 2022-04-30 DIAGNOSIS — Z1283 Encounter for screening for malignant neoplasm of skin: Secondary | ICD-10-CM | POA: Diagnosis not present

## 2022-04-30 DIAGNOSIS — X32XXXA Exposure to sunlight, initial encounter: Secondary | ICD-10-CM | POA: Diagnosis not present

## 2022-04-30 DIAGNOSIS — L570 Actinic keratosis: Secondary | ICD-10-CM | POA: Diagnosis not present

## 2022-05-02 NOTE — Patient Instructions (Signed)
Health Maintenance, Male Adopting a healthy lifestyle and getting preventive care are important in promoting health and wellness. Ask your health care provider about: The right schedule for you to have regular tests and exams. Things you can do on your own to prevent diseases and keep yourself healthy. What should I know about diet, weight, and exercise? Eat a healthy diet  Eat a diet that includes plenty of vegetables, fruits, low-fat dairy products, and lean protein. Do not eat a lot of foods that are high in solid fats, added sugars, or sodium. Maintain a healthy weight Body mass index (BMI) is a measurement that can be used to identify possible weight problems. It estimates body fat based on height and weight. Your health care provider can help determine your BMI and help you achieve or maintain a healthy weight. Get regular exercise Get regular exercise. This is one of the most important things you can do for your health. Most adults should: Exercise for at least 150 minutes each week. The exercise should increase your heart rate and make you sweat (moderate-intensity exercise). Do strengthening exercises at least twice a week. This is in addition to the moderate-intensity exercise. Spend less time sitting. Even light physical activity can be beneficial. Watch cholesterol and blood lipids Have your blood tested for lipids and cholesterol at 70 years of age, then have this test every 5 years. You may need to have your cholesterol levels checked more often if: Your lipid or cholesterol levels are high. You are older than 70 years of age. You are at high risk for heart disease. What should I know about cancer screening? Many types of cancers can be detected early and may often be prevented. Depending on your health history and family history, you may need to have cancer screening at various ages. This may include screening for: Colorectal cancer. Prostate cancer. Skin cancer. Lung  cancer. What should I know about heart disease, diabetes, and high blood pressure? Blood pressure and heart disease High blood pressure causes heart disease and increases the risk of stroke. This is more likely to develop in people who have high blood pressure readings or are overweight. Talk with your health care provider about your target blood pressure readings. Have your blood pressure checked: Every 3-5 years if you are 18-39 years of age. Every year if you are 40 years old or older. If you are between the ages of 65 and 75 and are a current or former smoker, ask your health care provider if you should have a one-time screening for abdominal aortic aneurysm (AAA). Diabetes Have regular diabetes screenings. This checks your fasting blood sugar level. Have the screening done: Once every three years after age 45 if you are at a normal weight and have a low risk for diabetes. More often and at a younger age if you are overweight or have a high risk for diabetes. What should I know about preventing infection? Hepatitis B If you have a higher risk for hepatitis B, you should be screened for this virus. Talk with your health care provider to find out if you are at risk for hepatitis B infection. Hepatitis C Blood testing is recommended for: Everyone born from 1945 through 1965. Anyone with known risk factors for hepatitis C. Sexually transmitted infections (STIs) You should be screened each year for STIs, including gonorrhea and chlamydia, if: You are sexually active and are younger than 70 years of age. You are older than 70 years of age and your   health care provider tells you that you are at risk for this type of infection. Your sexual activity has changed since you were last screened, and you are at increased risk for chlamydia or gonorrhea. Ask your health care provider if you are at risk. Ask your health care provider about whether you are at high risk for HIV. Your health care provider  may recommend a prescription medicine to help prevent HIV infection. If you choose to take medicine to prevent HIV, you should first get tested for HIV. You should then be tested every 3 months for as long as you are taking the medicine. Follow these instructions at home: Alcohol use Do not drink alcohol if your health care provider tells you not to drink. If you drink alcohol: Limit how much you have to 0-2 drinks a day. Know how much alcohol is in your drink. In the [place] one drink equals one 12 oz bottle of beer (355 mL), one 5 oz glass of Aggie Douse (148 mL), or one 1 oz glass of hard liquor (44 mL). Lifestyle Do not use any products that contain nicotine or tobacco. These products include cigarettes, chewing tobacco, and vaping devices, such as e-cigarettes. If you need help quitting, ask your health care provider. Do not use street drugs. Do not share needles. Ask your health care provider for help if you need support or information about quitting drugs. General instructions Schedule regular health, dental, and eye exams. Stay current with your vaccines. Tell your health care provider if: You often feel depressed. You have ever been abused or do not feel safe at home. Summary Adopting a healthy lifestyle and getting preventive care are important in promoting health and wellness. Follow your health care provider's instructions about healthy diet, exercising, and getting tested or screened for diseases. Follow your health care provider's instructions on monitoring your cholesterol and blood pressure. This information is not intended to replace advice given to you by your health care provider. Make sure you discuss any questions you have with your health care provider. Document Revised: 10/17/2020 Document Reviewed: 10/17/2020 Elsevier Patient Education  2023 Elsevier Inc.  
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
## 2022-05-07 ENCOUNTER — Ambulatory Visit (INDEPENDENT_AMBULATORY_CARE_PROVIDER_SITE_OTHER): Payer: Medicare Other | Admitting: *Deleted

## 2022-05-07 DIAGNOSIS — Z0100 Encounter for examination of eyes and vision without abnormal findings: Secondary | ICD-10-CM | POA: Diagnosis not present

## 2022-05-07 DIAGNOSIS — Z0000 Encounter for general adult medical examination without abnormal findings: Secondary | ICD-10-CM | POA: Diagnosis not present

## 2022-05-22 ENCOUNTER — Ambulatory Visit (INDEPENDENT_AMBULATORY_CARE_PROVIDER_SITE_OTHER): Payer: Medicare Other

## 2022-05-22 ENCOUNTER — Encounter: Payer: Self-pay | Admitting: Emergency Medicine

## 2022-05-22 ENCOUNTER — Ambulatory Visit (INDEPENDENT_AMBULATORY_CARE_PROVIDER_SITE_OTHER): Payer: Medicare Other | Admitting: Emergency Medicine

## 2022-05-22 VITALS — BP 120/76 | HR 45 | Temp 97.6°F | Ht 67.0 in | Wt 179.0 lb

## 2022-05-22 DIAGNOSIS — R0600 Dyspnea, unspecified: Secondary | ICD-10-CM

## 2022-05-22 DIAGNOSIS — J439 Emphysema, unspecified: Secondary | ICD-10-CM | POA: Diagnosis not present

## 2022-05-22 DIAGNOSIS — J61 Pneumoconiosis due to asbestos and other mineral fibers: Secondary | ICD-10-CM | POA: Diagnosis not present

## 2022-05-22 DIAGNOSIS — I6523 Occlusion and stenosis of bilateral carotid arteries: Secondary | ICD-10-CM | POA: Diagnosis not present

## 2022-05-22 DIAGNOSIS — R0602 Shortness of breath: Secondary | ICD-10-CM | POA: Diagnosis not present

## 2022-05-22 MED ORDER — BREZTRI AEROSPHERE 160-9-4.8 MCG/ACT IN AERO: 2.0000 | INHALATION_SPRAY | Freq: Two times a day (BID) | RESPIRATORY_TRACT | 11 refills | Status: DC

## 2022-05-22 NOTE — Patient Instructions (Signed)
Chronic Obstructive Pulmonary Disease  Chronic obstructive pulmonary disease (COPD) is a long-term (chronic) lung problem. When you have COPD, it is hard for air to get in and out of your lungs. Usually the condition gets worse over time, and your lungs will never return to normal. There are things you can do to keep yourself as healthy as possible. What are the causes? Smoking. This is the most common cause. Certain genes passed from parent to child (inherited). What increases the risk? Being exposed to secondhand smoke from cigarettes, pipes, or cigars. Being exposed to chemicals and other irritants, such as fumes and dust in the work environment. Having chronic lung conditions or infections. What are the signs or symptoms? Shortness of breath, especially during physical activity. A long-term cough with a large amount of thick mucus. Sometimes, the cough may not have any mucus (dry cough). Wheezing. Breathing quickly. Skin that looks gray or blue, especially in the fingers, toes, or lips. Feeling tired (fatigue). Weight loss. Chest tightness. Having infections often. Episodes when breathing symptoms become much worse (exacerbations). At the later stages of this disease, you may have swelling in the ankles, feet, or legs. How is this treated? Taking medicines. Quitting smoking, if you smoke. Rehabilitation. This includes steps to make your body work better. It may involve a team of specialists. Doing exercises. Making changes to your diet. Using oxygen. Lung surgery. Lung transplant. Comfort measures (palliative care). Follow these instructions at home: Medicines Take over-the-counter and prescription medicines only as told by your doctor. Talk to your doctor before taking any cough or allergy medicines. You may need to avoid medicines that cause your lungs to be dry. Lifestyle If you smoke, stop smoking. Smoking makes the problem worse. Do not smoke or use any products that  contain nicotine or tobacco. If you need help quitting, ask your doctor. Avoid being around things that make your breathing worse. This may include smoke, chemicals, and fumes. Stay active, but remember to rest as well. Learn and use tips on how to manage stress and control your breathing. Make sure you get enough sleep. Most adults need at least 7 hours of sleep every night. Eat healthy foods. Eat smaller meals more often. Rest before meals. Controlled breathing Learn and use tips on how to control your breathing as told by your doctor. Try: Breathing in (inhaling) through your nose for 1 second. Then, pucker your lips and breath out (exhale) through your lips for 2 seconds. Putting one hand on your belly (abdomen). Breathe in slowly through your nose for 1 second. Your hand on your belly should move out. Pucker your lips and breathe out slowly through your lips. Your hand on your belly should move in as you breathe out.  Controlled coughing Learn and use controlled coughing to clear mucus from your lungs. Follow these steps: Lean your head a little forward. Breathe in deeply. Try to hold your breath for 3 seconds. Keep your mouth slightly open while coughing 2 times. Spit any mucus out into a tissue. Rest and do the steps again 1 or 2 times as needed. General instructions Make sure you get all the shots (vaccines) that your doctor recommends. Ask your doctor about a flu shot and a pneumonia shot. Use oxygen therapy and pulmonary rehabilitation if told by your doctor. If you need home oxygen therapy, ask your doctor if you should buy a tool to measure your oxygen level (oximeter). Make a COPD action plan with your doctor. This helps you   to know what to do if you feel worse than usual. Manage any other conditions you have as told by your doctor. Avoid going outside when it is very hot, cold, or humid. Avoid people who have a sickness you can catch (contagious). Keep all follow-up  visits. Contact a doctor if: You cough up more mucus than usual. There is a change in the color or thickness of the mucus. It is harder to breathe than usual. Your breathing is faster than usual. You have trouble sleeping. You need to use your medicines more often than usual. You have trouble doing your normal activities such as getting dressed or walking around the house. Get help right away if: You have shortness of breath while resting. You have shortness of breath that stops you from: Being able to talk. Doing normal activities. Your chest hurts for longer than 5 minutes. Your skin color is more blue than usual. Your pulse oximeter shows that you have low oxygen for longer than 5 minutes. You have a fever. You feel too tired to breathe normally. These symptoms may represent a serious problem that is an emergency. Do not wait to see if the symptoms will go away. Get medical help right away. Call your local emergency services (911 in the [place] Do not drive yourself to the hospital. Summary Chronic obstructive pulmonary disease (COPD) is a long-term lung problem. The way your lungs work will never return to normal. Usually the condition gets worse over time. There are things you can do to keep yourself as healthy as possible. Take over-the-counter and prescription medicines only as told by your doctor. If you smoke, stop. Smoking makes the problem worse. This information is not intended to replace advice given to you by your health care provider. Make sure you discuss any questions you have with your health care provider. Document Revised: 04/05/2020 Document Reviewed: 04/05/2020 [employer] Patient Education  2023 [employer]  

## 2022-05-22 NOTE — Assessment & Plan Note (Signed)
Differential diagnosis discussed. 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  Referral placed today. Recommend to start Breztri twice a day. EKG shows sinus bradycardia with no acute ischemic changes. Doubt cardiac etiology of symptoms Follow-up with PCP after pulmonary evaluation Will also need CT chest for further evaluation as recommended by radiologist.
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
## 2022-05-22 NOTE — Assessment & Plan Note (Signed)
Mostly emphysema.  Contributing greatly to symptoms. May benefit from [facility] inhaler twice a day. Needs pulmonary evaluation.

## 2022-05-29 DIAGNOSIS — M542 Cervicalgia: Secondary | ICD-10-CM | POA: Diagnosis not present

## 2022-06-06 DIAGNOSIS — M542 Cervicalgia: Secondary | ICD-10-CM | POA: Diagnosis not present

## 2022-06-14 DIAGNOSIS — G959 Disease of spinal cord, unspecified: Secondary | ICD-10-CM | POA: Diagnosis not present

## 2022-06-14 DIAGNOSIS — Z6827 Body mass index (BMI) 27.0-27.9, adult: Secondary | ICD-10-CM | POA: Diagnosis not present

## 2022-06-20 DIAGNOSIS — H04123 Dry eye syndrome of bilateral lacrimal glands: Secondary | ICD-10-CM | POA: Diagnosis not present

## 2022-06-20 DIAGNOSIS — H524 Presbyopia: Secondary | ICD-10-CM | POA: Diagnosis not present

## 2022-06-20 DIAGNOSIS — H3554 Dystrophies primarily involving the retinal pigment epithelium: Secondary | ICD-10-CM | POA: Diagnosis not present

## 2022-06-20 DIAGNOSIS — H2513 Age-related nuclear cataract, bilateral: Secondary | ICD-10-CM | POA: Diagnosis not present

## 2022-06-20 DIAGNOSIS — H1045 Other chronic allergic conjunctivitis: Secondary | ICD-10-CM | POA: Diagnosis not present

## 2022-06-23 NOTE — Progress Notes (Signed)
Send this report to PCP Dr. Jenny Reichmann please.

## 2022-07-02 ENCOUNTER — Encounter (HOSPITAL_BASED_OUTPATIENT_CLINIC_OR_DEPARTMENT_OTHER): Payer: Self-pay | Admitting: Pulmonary Disease

## 2022-07-02 ENCOUNTER — Ambulatory Visit (INDEPENDENT_AMBULATORY_CARE_PROVIDER_SITE_OTHER): Payer: Medicare Other | Admitting: Pulmonary Disease

## 2022-07-02 VITALS — BP 118/70 | HR 57 | Ht 67.0 in | Wt 176.2 lb

## 2022-07-02 DIAGNOSIS — R0602 Shortness of breath: Secondary | ICD-10-CM

## 2022-07-02 DIAGNOSIS — J920 Pleural plaque with presence of asbestos: Secondary | ICD-10-CM | POA: Diagnosis not present

## 2022-07-02 LAB — PULMONARY FUNCTION TEST
DL/VA % pred: 101 %
DL/VA: 4.16 ml/min/mmHg/L
DLCO cor % pred: 93 %
DLCO cor: 22.15 ml/min/mmHg
DLCO unc % pred: 93 %
DLCO unc: 22.15 ml/min/mmHg
FEF 25-75 Post: 1.69 L/sec
FEF 25-75 Pre: 1.25 L/sec
FEF2575-%Change-Post: 35 %
FEF2575-%Pred-Post: 76 %
FEF2575-%Pred-Pre: 56 %
FEV1-%Change-Post: 8 %
FEV1-%Pred-Post: 86 %
FEV1-%Pred-Pre: 80 %
FEV1-Post: 2.51 L
FEV1-Pre: 2.31 L
FEV1FVC-%Change-Post: -2 %
FEV1FVC-%Pred-Pre: 86 %
FEV6-%Change-Post: 10 %
FEV6-%Pred-Post: 106 %
FEV6-%Pred-Pre: 95 %
FEV6-Post: 3.92 L
FEV6-Pre: 3.54 L
FEV6FVC-%Change-Post: 0 %
FEV6FVC-%Pred-Post: 103 %
FEV6FVC-%Pred-Pre: 104 %
FVC-%Change-Post: 10 %
FVC-%Pred-Post: 102 %
FVC-%Pred-Pre: 92 %
FVC-Post: 4.01 L
FVC-Pre: 3.62 L
Post FEV1/FVC ratio: 62 %
Post FEV6/FVC ratio: 98 %
Pre FEV1/FVC ratio: 64 %
Pre FEV6/FVC Ratio: 98 %
RV % pred: 168 %
RV: 3.85 L
TLC % pred: 117 %
TLC: 7.55 L

## 2022-07-02 MED ORDER — ANORO ELLIPTA 62.5-25 MCG/ACT IN AEPB: 1.0000 | INHALATION_SPRAY | Freq: Every day | RESPIRATORY_TRACT | 5 refills | Status: DC

## 2022-07-02 NOTE — Progress Notes (Signed)
Full PFT Performed Today  

## 2022-07-02 NOTE — Progress Notes (Signed)
Subjective:   PATIENT ID: Guy Morris GENDER: male DOB: 20-Dec-1950, MRN: [id number]  Chief Complaint  Patient presents with   Consult    Pt is being referred due to recent imaging. States that he has had problems with SOB that can happen at any time. Has had some problems with coughing and wheezing over the past week.    Reason for Visit: New consult for abnormal imaging, SOB  [name] is a 72 year old male former smoker with COPD, atrial fibrillation s/p ablation in 2021/22, HLD, GERD, BPH, hypothyroidism who presents for evaluation for shortness of breath  He reports recent respiratory illness this has improved. He has had gradual shortness of breath in the last year. Wants to be active. Difficulty running errands. Walking shortness distances including going to doctor offices will tire him. Able to grocery shop if he stops and takes frequently breaks. Denies chronic coughing or wheezing. Had covid in 10/2019 and 06/2021.   Social History: Former smoker. Quit 8 years ago in 2016. 74 pack-years Previously worked as an Clinical biochemist x 30 years. Located at [employer], suspected asbestos exposure  I have personally reviewed patient's past medical/family/social history, allergies, current medications.  Past Medical History:  Diagnosis Date   ABNORMAL TRANSAMINASE-LFT'S 08/04/2008   Qualifier: Diagnosis of  By: Fuller Plan MD Lamont Snowball T    ALLERGIC RHINITIS 05/06/2007   Qualifier: Diagnosis of  By: Jenny Reichmann MD, Hunt Oris    Allergy    Atrial fibrillation [facility])    BPH (benign prostatic hyperplasia) 04/28/2012   COPD (chronic obstructive pulmonary disease) ([place])    Coronary artery disease with history of myocardial infarction without history of CABG 04/11/2021   Degenerative joint disease 04/23/2011   ERECTILE DYSFUNCTION 05/06/2007   Qualifier: Diagnosis of  By: Jenny Reichmann MD, Hunt Oris    GERD 05/06/2007   Qualifier: Diagnosis of  By: Jenny Reichmann MD, Hunt Oris    HYPERLIPIDEMIA 05/06/2007    Qualifier: Diagnosis of  By: Jenny Reichmann MD, [place] 05/06/2007   Qualifier: Diagnosis of  By: Jenny Reichmann MD, Hunt Oris    Insomnia 04/28/2012   OSTEOARTHRITIS, KNEE, 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   Qualifier: Diagnosis of  By: Jenny Reichmann MD, Hunt Oris    Personal history of colonic polyps 07/20/2008   Centricity Description: PERSONAL HX COLONIC POLYPS Qualifier: Diagnosis of  By: Ardis Hughs MD, Melene Plan  Centricity Description: COLONIC POLYPS, HX OF Qualifier: Diagnosis of  By: Jenny Reichmann MD, Hunt Oris    RLS (restless legs syndrome) 10/17/2010   Urge incontinence 04/28/2012   vesicare heps per urology     Family History  Problem Relation Age of Onset   Cancer Mother        Breast Cancer   Arthritis Mother        RA   Cancer Father        Lung Cancer   Esophageal cancer Father    Pancreatic cancer Maternal Uncle    Stomach cancer Sister    Colon cancer Neg Hx    Colon polyps Neg Hx    Rectal cancer Neg Hx      Social History   Occupational History   Occupation: disabled - DJD knees, neck and shoulders - on SSI  Tobacco Use   Smoking status: Former    Packs/day: 2.00    Years: 37.00    Total pack years: 74.00    Types: Cigarettes    Quit date: 06/11/2000    Years since quitting:  22.0   Smokeless tobacco: Never   Tobacco comments:    quit smoking cigarettes 2002 and quit cigars 2016  Vaping Use   Vaping Use: Never used  Substance and Sexual Activity   Alcohol use: No   Drug use: No   Sexual activity: Not Currently    Allergies  Allergen Reactions   Atorvastatin     myalgia   Claritin [Loratadine]     dizzy   Rofecoxib Itching   Rosuvastatin     myalgia   Zocor [Simvastatin] Other (See Comments)    myalgia     Outpatient Medications Prior to Visit  Medication Sig Dispense Refill   ascorbic acid (VITAMIN C) 500 MG tablet Take 500 mg by mouth daily.     aspirin EC 81 MG tablet Take 81 mg by mouth daily. Swallow whole.     Ca Carbonate-Mag Hydroxide (ROLAIDS PO) Take 1 tablet by mouth  3 (three) times daily as needed (heartburn).     Cholecalciferol (THERA-D 2000) 50 MCG (2000 UT) TABS 1 tab by mouth once daily 30 tablet 99   citalopram (CELEXA) 10 MG tablet Take 1 tablet (10 mg total) by mouth daily. 90 tablet 3   levothyroxine (SYNTHROID) 175 MCG tablet Take 1 tablet (175 mcg total) by mouth daily before breakfast. 90 tablet 3   meclizine (ANTIVERT) 12.5 MG tablet TAKE 1 TABLET BY MOUTH THREE TIMES DAILY AS NEEDED FOR DIZZINESS 30 tablet 0   metoprolol tartrate (LOPRESSOR) 50 MG tablet Take 1/2 tablet ( 25 mg ) twice a day 90 tablet 3   polyethylene glycol (MIRALAX / GLYCOLAX) 17 g packet Take 17 g by mouth daily. 14 each 0   Turmeric Curcumin 500 MG CAPS Take 1,000 mg by mouth daily.     vitamin B-12 (CYANOCOBALAMIN) 1000 MCG tablet Take 1 tablet (1,000 mcg total) by mouth daily. 90 tablet 1   nitroGLYCERIN (NITROSTAT) 0.4 MG SL tablet Place 1 tablet (0.4 mg total) under the tongue every 5 (five) minutes as needed for chest pain. 90 tablet 3   Budeson-Glycopyrrol-Formoterol (BREZTRI AEROSPHERE) 160-9-4.8 MCG/ACT AERO Inhale 2 puffs into the lungs 2 (two) times daily. 10.7 g 11   No facility-administered medications prior to visit.    ROS   Objective:   Vitals:   07/02/22 1433  BP: 118/70  Pulse: (!) 57  SpO2: 98%   SpO2: 98 % O2 Device: None (Room air)  Physical Exam: General: Well-appearing, no acute distress HENT: [place], AT Eyes: EOMI, no scleral icterus Respiratory: Clear to auscultation bilaterally.  No crackles, wheezing or rales Cardiovascular: RRR, -M/R/G, no JVD Extremities:-Edema,-tenderness Neuro: AAO x4, CNII-XII grossly intact Psych: Normal mood, normal affect  Data Reviewed:  Imaging: CT A/P 06/29/21 - Lower lobes with normal parenchyma CXR 05/22/22 - Right lateral chest with increased nodularity. Pleural plaques. Subtle nodularity in left base  PFT: 04/25/20 FVC 4.09 (98%) FEV1 2.71 (88%) Ratio 66  Interpretation: Mild obstructive defect  based on GOLD's criteria  Labs: CBC    Component Value Date/Time   WBC 6.7 09/12/2021 1031   RBC 5.17 09/12/2021 1031   HGB 16.0 09/12/2021 1031   HGB 15.9 04/21/2020 1142   HCT 47.5 09/12/2021 1031   HCT 46.4 04/21/2020 1142   PLT 251.0 09/12/2021 1031   PLT 255 04/21/2020 1142   MCV 91.9 09/12/2021 1031   MCV 92 04/21/2020 1142   MCH 30.2 05/11/2020 0800   MCHC 33.6 09/12/2021 1031   RDW 12.8 09/12/2021 1031  RDW 12.1 04/21/2020 1142   LYMPHSABS 2.3 09/12/2021 1031   LYMPHSABS 1.8 04/21/2020 1142   MONOABS 0.7 09/12/2021 1031   EOSABS 0.1 09/12/2021 1031   EOSABS 0.1 04/21/2020 1142   BASOSABS 0.0 09/12/2021 1031   BASOSABS 0.1 04/21/2020 1142   Absolute eos 09/12/21 - 100     Assessment & Plan:   Discussion: 72 year old male former smoker with COPD, atrial fibrillation s/p ablation in 2021/22, HLD, GERD, BPH, hypothyroidism who presents for evaluation for shortness of breath. Discussed clinical course and management of COPD including bronchodilator regimen and action plan for exacerbation   COPD  --START Anoro ONE puff ONCE a day. Please call our office with inhaler cost if this is too high. Please also ask your pharmacist if you have met your insurance deductible --Encourage regular exercise five days a walk up to 20-30 daily --Consider purchasing a pedometer  Pleural plaques --ORDER CT chest without contrast  Health Maintenance Immunization History  Administered Date(s) Administered   Influenza Split 04/23/2011, 04/28/2012   Influenza Whole 03/12/2007, 04/18/2010   Influenza, High Dose Seasonal PF 03/02/2021   Influenza,inj,Quad PF,6+ Mos 04/30/2013, 04/29/2014, 05/03/2015, 05/08/2016   Influenza-Unspecified 05/13/2017, 03/26/2018, 04/06/2019, 04/05/2020   Janssen (J&J) SARS-COV-2 Vaccination 09/16/2019   Pneumococcal Conjugate-13 11/06/2016   Pneumococcal Polysaccharide-23 11/05/2017   Td 04/11/1994, 09/13/2008   Tdap 08/14/2018   CT Lung Screen -  consider in the future after above CT  Orders Placed This Encounter  Procedures   CT Chest High Resolution    Schedule when next available    Standing Status:   Future    Standing Expiration Date:   07/03/2023    Order Specific Question:   Preferred imaging location?    Answer:   [facility]   Pulmonary function test    Standing Status:   Future    Number of Occurrences:   1    Standing Expiration Date:   07/03/2023    Order Specific Question:   Where should this test be performed?    Answer:   [facility]    Order Specific Question:   Full PFT: includes the following: basic spirometry, spirometry pre & post bronchodilator, diffusion capacity (DLCO), lung volumes    Answer:   Full PFT   Meds ordered this encounter  Medications   umeclidinium-vilanterol (ANORO ELLIPTA) 62.5-25 MCG/ACT AEPB    Sig: Inhale 1 puff into the lungs daily.    Dispense:  60 each    Refill:  5    Return in about 1 month (around 08/02/2022).  I have spent a total time of 45-minutes on the day of the appointment reviewing prior documentation, coordinating care and discussing medical diagnosis and plan with the patient/family. Imaging, labs and tests included in this note have been reviewed and interpreted independently by me.  [place], MD [facility] 07/02/2022 2:35 PM  Office Number [phone]

## 2022-07-02 NOTE — Patient Instructions (Addendum)
COPD  --START Anoro ONE puff ONCE a day. Please call our office with inhaler cost if this is too high. Please also ask your pharmacist if you have met your insurance deductible --Encourage regular exercise five days a walk up to 20-30 daily --Consider purchasing a pedometer  Pleural plaques --ORDER CT chest without contrast  Follow-up with me in 1 month

## 2022-07-02 NOTE — Patient Instructions (Signed)
Full PFT Performed Today  

## 2022-07-03 ENCOUNTER — Telehealth (HOSPITAL_BASED_OUTPATIENT_CLINIC_OR_DEPARTMENT_OTHER): Payer: Self-pay | Admitting: Pulmonary Disease

## 2022-07-04 ENCOUNTER — Institutional Professional Consult (permissible substitution) (HOSPITAL_BASED_OUTPATIENT_CLINIC_OR_DEPARTMENT_OTHER): Payer: Medicare Other | Admitting: Pulmonary Disease

## 2022-07-05 ENCOUNTER — Other Ambulatory Visit (HOSPITAL_COMMUNITY): Payer: Self-pay

## 2022-07-05 NOTE — Telephone Encounter (Signed)
Called and spoke with pt who states the Anoro was going to cost him around $499 which he cannot afford.  Pt is wanting to know if there is something else that could be prescribed in place of it.  Routing to prior auth team to see which inhalers are less costly for pt. Then will route this to Dr. Loanne Drilling.

## 2022-07-05 NOTE — Telephone Encounter (Signed)
Does patient have any other insurance other than Medicare Part B? Not showing anything with the eligibility check or any other cards that have been scanned.

## 2022-07-06 NOTE — Telephone Encounter (Signed)
Patient is returning phone call. Patient only has Medicare Part B insurance. Patient phone number is [phone].

## 2022-07-06 NOTE — Telephone Encounter (Signed)
Pt only has medicare part b insurance.

## 2022-07-06 NOTE — Telephone Encounter (Signed)
Called patient but he did not answer. Left message for him to call us back.  

## 2022-07-08 ENCOUNTER — Encounter (HOSPITAL_BASED_OUTPATIENT_CLINIC_OR_DEPARTMENT_OTHER): Payer: Self-pay | Admitting: Pulmonary Disease

## 2022-07-11 NOTE — Telephone Encounter (Signed)
Medicare part B will not cover any inhalers, Medicare Part B will cover some nebulizer solutions IF the patient has a DX of COPD.

## 2022-07-11 NOTE — Telephone Encounter (Signed)
Routing to Dr. Loanne Drilling for review.

## 2022-07-12 NOTE — Telephone Encounter (Signed)
He has a history of COPD Cost for Pulmicort and Brovana under part B?

## 2022-07-13 ENCOUNTER — Other Ambulatory Visit: Payer: Self-pay | Admitting: Neurological Surgery

## 2022-07-17 ENCOUNTER — Ambulatory Visit (HOSPITAL_COMMUNITY)
Admission: RE | Admit: 2022-07-17 | Discharge: 2022-07-17 | Disposition: A | Discharge status: Home / Self Care | Payer: Medicare Other | Source: Ambulatory Visit | Attending: Pulmonary Disease | Admitting: Pulmonary Disease

## 2022-07-17 DIAGNOSIS — J920 Pleural plaque with presence of asbestos: Secondary | ICD-10-CM

## 2022-07-17 DIAGNOSIS — J929 Pleural plaque without asbestos: Secondary | ICD-10-CM | POA: Diagnosis not present

## 2022-07-17 DIAGNOSIS — J432 Centrilobular emphysema: Secondary | ICD-10-CM | POA: Diagnosis not present

## 2022-07-17 DIAGNOSIS — J948 Other specified pleural conditions: Secondary | ICD-10-CM | POA: Diagnosis not present

## 2022-07-17 DIAGNOSIS — I700 Atherosclerosis of aorta: Secondary | ICD-10-CM | POA: Diagnosis not present

## 2022-07-20 NOTE — Progress Notes (Signed)
Surgical Instructions    Your procedure is scheduled on Monday, February 19th, 2024.   Report to [facility] Main Entrance "A" at 05:30 A.M., then check in with the Admitting office.  Call this number if you have problems the morning of surgery:  [phone]   If you have any questions prior to your surgery date call [phone]: Open Monday-Friday 8am-4pm If you experience any cold or flu symptoms such as cough, fever, chills, shortness of breath, etc. between now and your scheduled surgery, please notify us at the above number     Remember:  Do not eat after midnight the night before your surgery  You may drink clear liquids until 04:30 the morning of your surgery.   Clear liquids allowed are: Water, Non-Citrus Juices (without pulp), Carbonated Beverages, Clear Tea, Black Coffee ONLY (NO MILK, CREAM OR POWDERED CREAMER of any kind), and Gatorade    Take these medicines the morning of surgery with A SIP OF WATER:   gabapentin (NEURONTIN)  levothyroxine (SYNTHROID)  metoprolol tartrate (LOPRESSOR)  omeprazole (PRILOSEC)   As needed:  meclizine (ANTIVERT)  nitroGLYCERIN (NITROSTAT)    Follow your surgeon's instructions on when to stop Aspirin.  If no instructions were given by your surgeon then you will need to call the office to get those instructions.     As of today, STOP taking any Aspirin (unless otherwise instructed by your surgeon) Aleve, Naproxen, Ibuprofen, Motrin, Advil, Goody's, BC's, all herbal medications, fish oil, and all vitamins.   The day of surgery:          Do not wear jewelry  Do not wear lotions, powders, cologne or deodorant. Men may shave face and neck. Do not bring valuables to the hospital.   [facility] is not responsible for any belongings or valuables.    Do NOT Smoke (Tobacco/Vaping)  24 hours prior to your procedure  If you use a CPAP at night, you may bring your mask for your overnight stay.   Contacts, glasses, hearing aids, dentures  or partials may not be worn into surgery, please bring cases for these belongings   For patients admitted to the hospital, discharge time will be determined by your treatment team.   Patients discharged the day of surgery will not be allowed to drive home, and someone needs to stay with them for 24 hours.   SURGICAL WAITING ROOM VISITATION Patients having surgery or a procedure may have no more than 2 support people in the waiting area - these visitors may rotate.   Children under the age of 41 must have an adult with them who is not the patient. If the patient needs to stay at the hospital during part of their recovery, the visitor guidelines for inpatient rooms apply. Pre-op nurse will coordinate an appropriate time for 1 support person to accompany patient in pre-op.  This support person may not rotate.   Please refer to [email or web address] for the visitor guidelines for Inpatients (after your surgery is over and you are in a regular room).    Special instructions:    Oral Hygiene is also important to reduce your risk of infection.  Remember - BRUSH YOUR TEETH THE MORNING OF SURGERY WITH YOUR REGULAR TOOTHPASTE   [place]- Preparing For Surgery  Before surgery, you can play an important role. Because skin is not sterile, your skin needs to be as free of germs as possible. You can reduce the number of germs on your skin by washing with CHG (chlorahexidine  gluconate) Soap before surgery.  CHG is an antiseptic cleaner which kills germs and bonds with the skin to continue killing germs even after washing.     Please do not use if you have an allergy to CHG or antibacterial soaps. If your skin becomes reddened/irritated stop using the CHG.  Do not shave (including legs and underarms) for at least 48 hours prior to first CHG shower. It is OK to shave your face.  Please follow these instructions carefully.     Shower the NIGHT BEFORE  SURGERY and the MORNING OF SURGERY with CHG Soap.   If you chose to wash your hair, wash your hair first as usual with your normal shampoo. After you shampoo, rinse your hair and body thoroughly to remove the shampoo.  Then [employer] and genitals (private parts) with your normal soap and rinse thoroughly to remove soap.  After that Use CHG Soap as you would any other liquid soap. You can apply CHG directly to the skin and wash gently with a scrungie or a clean washcloth.   Apply the CHG Soap to your body ONLY FROM THE NECK DOWN.  Do not use on open wounds or open sores. Avoid contact with your eyes, ears, mouth and genitals (private parts). Wash Face and genitals (private parts)  with your normal soap.   Wash thoroughly, paying special attention to the area where your surgery will be performed.  Thoroughly rinse your body with warm water from the neck down.  DO NOT shower/wash with your normal soap after using and rinsing off the CHG Soap.  Pat yourself dry with a CLEAN TOWEL.  Wear CLEAN PAJAMAS to bed the night before surgery  Place CLEAN SHEETS on your bed the night before your surgery  DO NOT SLEEP WITH PETS.   Day of Surgery:  Take a shower with CHG soap. Wear Clean/Comfortable clothing the morning of surgery Do not apply any deodorants/lotions.   Remember to brush your teeth WITH YOUR REGULAR TOOTHPASTE.    If you received a COVID test during your pre-op visit, it is requested that you wear a mask when out in public, stay away from anyone that may not be feeling well, and notify your surgeon if you develop symptoms. If you have been in contact with anyone that has tested positive in the last 10 days, please notify your surgeon.    Please read over the following fact sheets that you were given.

## 2022-07-23 ENCOUNTER — Encounter (HOSPITAL_COMMUNITY)
Admission: RE | Admit: 2022-07-23 | Discharge: 2022-07-23 | Disposition: A | Discharge status: Home / Self Care | Payer: Medicare Other | Source: Ambulatory Visit | Attending: Neurological Surgery | Admitting: Neurological Surgery

## 2022-07-23 ENCOUNTER — Encounter (HOSPITAL_COMMUNITY): Payer: Self-pay

## 2022-07-23 ENCOUNTER — Other Ambulatory Visit: Payer: Self-pay

## 2022-07-23 VITALS — BP 136/83 | HR 43 | Temp 97.4°F | Resp 18 | Ht 67.0 in | Wt 179.4 lb

## 2022-07-23 DIAGNOSIS — Z01812 Encounter for preprocedural laboratory examination: Secondary | ICD-10-CM | POA: Insufficient documentation

## 2022-07-23 DIAGNOSIS — I2510 Atherosclerotic heart disease of native coronary artery without angina pectoris: Secondary | ICD-10-CM | POA: Insufficient documentation

## 2022-07-23 DIAGNOSIS — Z01818 Encounter for other preprocedural examination: Secondary | ICD-10-CM

## 2022-07-23 HISTORY — DX: Angina pectoris, unspecified: I20.9

## 2022-07-23 HISTORY — DX: Pneumonia, unspecified organism: J18.9

## 2022-07-23 HISTORY — DX: Acute myocardial infarction, unspecified: I21.9

## 2022-07-23 HISTORY — DX: Essential (primary) hypertension: I10

## 2022-07-23 HISTORY — DX: Cardiac arrhythmia, unspecified: I49.9

## 2022-07-23 HISTORY — DX: Peripheral vascular disease, unspecified: I73.9

## 2022-07-23 LAB — BASIC METABOLIC PANEL
Anion gap: 6 (ref 5–15)
BUN: 7 mg/dL — ABNORMAL LOW (ref 8–23)
CO2: 26 mmol/L (ref 22–32)
Calcium: 9.4 mg/dL (ref 8.9–10.3)
Chloride: 107 mmol/L (ref 98–111)
Creatinine, Ser: 0.76 mg/dL (ref 0.61–1.24)
GFR, Estimated: >60 mL/min (ref >60)
Glucose, Bld: 102 mg/dL — ABNORMAL HIGH (ref 70–99)
Potassium: 4.2 mmol/L (ref 3.5–5.1)
Sodium: 139 mmol/L (ref 135–145)

## 2022-07-23 LAB — CBC
HCT: 46.4 % (ref 39.0–52.0)
Hemoglobin: 15.3 g/dL (ref 13.0–17.0)
MCH: 31.1 pg (ref 26.0–34.0)
MCHC: 33 g/dL (ref 30.0–36.0)
MCV: 94.3 fL (ref 80.0–100.0)
Platelets: 242 10*3/uL (ref 150–400)
RBC: 4.92 MIL/uL (ref 4.22–5.81)
RDW: 12.4 % (ref 11.5–15.5)
WBC: 5.7 10*3/uL (ref 4.0–10.5)
nRBC: 0 % (ref 0.0–0.2)

## 2022-07-23 LAB — TYPE AND SCREEN
ABO/RH(D): A POS
Antibody Screen: NEGATIVE

## 2022-07-23 LAB — SURGICAL PCR SCREEN
MRSA, PCR: NEGATIVE
Staphylococcus aureus: NEGATIVE

## 2022-07-23 NOTE — Progress Notes (Signed)
PCP - Dr. Cathlean Cower Cardiologist - Dr. Minus Breeding Pulmonologist: Dr. Rodman Pickle  PPM/ICD - Denies Device Orders - n/a Rep Notified - n/a  Chest x-ray - 05/22/2022 EKG - 05/22/2022 Stress Test - 11/09/2015 ECHO - 05/03/2020 Cardiac Cath - 04/25/2020  Sleep Study - Denies CPAP - n/a  No DM  Last dose of GLP1 agonist- n/a GLP1 instructions: n/a  Blood Thinner Instructions: n/a Aspirin Instructions: Per surgeons instructions, pts last dose of ASA was July 23, 2022  ERAS Protcol - Clear liquids until 0430 morning of surgery PRE-SURGERY Ensure or G2- None ordered  COVID TEST- n/a   Anesthesia review: Yes. Discussed with [name], PA-C and is assisting with scheduling a cardiac clearance appointment. Pt follows-up regularly with [name], but last yearly visit was prior to decision to proceed with spine surgery with [name].    Patient denies shortness of breath, fever, cough and chest pain at PAT appointment   All instructions explained to the patient, with a verbal understanding of the material. Patient agrees to go over the instructions while at home for a better understanding. Patient also instructed to self quarantine after being tested for COVID-19. The opportunity to ask questions was provided.

## 2022-07-24 ENCOUNTER — Encounter (HOSPITAL_COMMUNITY): Payer: Self-pay | Admitting: Physician Assistant

## 2022-07-24 NOTE — Telephone Encounter (Signed)
Not able to do benefits investigation for Medicare Part B.

## 2022-07-24 NOTE — Telephone Encounter (Signed)
Patient has Medicare part A as well. What would be the coverage options for ICS/LABA or Stiolto?

## 2022-07-24 NOTE — Progress Notes (Signed)
Anesthesia Chart Review:  Follows with cardiology for history of atrial fibrillation (not on anticoagulation due to very infrequent nature and low thromboembolic risk), mild to moderate carotid stenosis, CAD s/p CABG 04/2020 with concomitant Maze procedure and clipping of atrial appendage, bradycardia.  Last seen by [name] 10/12/2021 and noted to be stable at that time.  No changes to management.  1 year follow-up recommended.  *Cardiac clearance pending   Recently established with pulmonology for management of COPD. 74-pack-year smoking history, quit in 2016.  Seen by Dr. Loanne Drilling on 07/02/2022, was started on an oral Ellipta at that time.  Preop labs reviewed, unremarkable.  EKG 05/22/2022: Sinus bradycardia.  Rate 46.  High-resolution CT chest 07/17/2022: IMPRESSION: 1. Thin bilateral pleural plaques, asymmetrically prominent on the right, a few of which are calcified on the right, not appreciably changed, compatible with asbestos related pleural disease. No pleural effusions. 2. No evidence of interstitial lung disease. Thin parenchymal bands at the left lung base are significantly decreased from prior chest CT, favoring minimal postinfectious/postinflammatory scarring. 3. Mild emphysema with diffuse bronchial wall thickening, suggesting COPD. 4. Aortic Atherosclerosis (ICD10-I70.0) and Emphysema (ICD10-J43.9).  Carotid duplex 04/20/2022: Summary:  Right Carotid: Velocities in the right ICA are consistent with a 1-39% stenosis.  Left Carotid: Velocities in the left ICA are consistent with a 40-59% stenosis.  Vertebrals: Right vertebral artery demonstrates antegrade flow. Left vertebral artery demonstrates no discernable flow.  Subclavians: Normal flow hemodynamics were seen in bilateral subclavian arteries.   TTE 05/03/2020:  1. Left ventricular ejection fraction, by estimation, is 50 to 55%. The  left ventricle has low normal function. The left ventricle demonstrates  regional  wall motion abnormalities (see scoring diagram/findings for  description). Left ventricular diastolic   function could not be evaluated. There is moderate hypokinesis of the  left ventricular, basal-mid inferolateral wall.   2. Right ventricular systolic function is normal. The right ventricular  size is normal. Tricuspid regurgitation signal is inadequate for assessing  PA pressure.   3. The mitral valve is normal in structure. No evidence of mitral valve  regurgitation. No evidence of mitral stenosis.   4. The aortic valve is normal in structure. Aortic valve regurgitation is  not visualized. No aortic stenosis is present.   Comparison(s): A prior study was performed on 04/25/2020. No significant  change from prior study. Prior images reviewed side by side. The  inferolateral wall motion abnormality was also present on the previous  study. Postoperative paradoxical septal  motion is new.     Wynonia Musty [facility] Short Stay Center/Anesthesiology Phone [phone] 07/26/2022 11:28 AM

## 2022-07-25 NOTE — Telephone Encounter (Signed)
We are only able to do pharmacy benefits which is Medicare Part D

## 2022-07-27 ENCOUNTER — Telehealth: Payer: Self-pay | Admitting: *Deleted

## 2022-07-27 ENCOUNTER — Ambulatory Visit (INDEPENDENT_AMBULATORY_CARE_PROVIDER_SITE_OTHER): Payer: Medicare Other | Admitting: Student

## 2022-07-27 DIAGNOSIS — Z01810 Encounter for preprocedural cardiovascular examination: Secondary | ICD-10-CM

## 2022-07-27 NOTE — Telephone Encounter (Signed)
I s/w the pt's wife and stated looks like we need an in office appt per the pre op app today. Pt stated to the pre op app he has been having chest pain and a-fib. Pt has been scheduled to see DR. O'Neal 07/30/22 @ 4:30. S/w Dr. Kathalene Frames nurse Almyra Free and she has given the ok to add the pt to DOD schedule. Pt and his wife are grateful for the help.  I will also update the surgeon office as well. Pt's surgery will need to be post poned until cleared by cardiologist.
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
## 2022-07-27 NOTE — Telephone Encounter (Signed)
-----   Message from Mayra Reel, NP sent at 07/27/2022  4:28 PM EST ----- Patient needs in office visit for episodes of chest pain and afib.

## 2022-07-27 NOTE — Telephone Encounter (Signed)
   Name: Guy Morris  DOB: 04-29-1951  MRN: [id number]  Primary Cardiologist: Minus Breeding, MD   Preoperative team, please contact this patient and set up a phone call appointment for further preoperative risk assessment. Please obtain consent and complete medication review. Thank you for your help.    Ledora Bottcher, PA 07/27/2022, 1:42 PM  [facility]

## 2022-07-27 NOTE — Telephone Encounter (Signed)
Our office has just been made aware today that the pt is having surgery Monday 07/30/22. Per the pt's wife the surgeon office knew 2 weeks ago the pt was having surgery. In not delaying the pt's surgery our pre op team has agreed to over book the schedule today. Pt's wife thanked me so much for our help in this matter. Med rec and consent are done.     Patient Consent for Virtual Visit        Guy Morris has provided verbal consent on 07/27/2022 for a virtual visit (video or telephone).   CONSENT FOR VIRTUAL VISIT FOR:  Guy Morris  By participating in this virtual visit I agree to the following:  I hereby voluntarily request, consent and authorize [facility] and its employed or contracted physicians, physician assistants, nurse practitioners or other licensed health care professionals (the Practitioner), to provide me with telemedicine health care services (the "Services") as deemed necessary by the treating Practitioner. I acknowledge and consent to receive the Services by the Practitioner via telemedicine. I understand that the telemedicine visit will involve communicating with the Practitioner through live audiovisual communication technology and the disclosure of certain medical information by electronic transmission. I acknowledge that I have been given the opportunity to request an in-person assessment or other available alternative prior to the telemedicine visit and am voluntarily participating in the telemedicine visit.  I understand that I have the right to withhold or withdraw my consent to the use of telemedicine in the course of my care at any time, without affecting my right to future care or treatment, and that the Practitioner or I may terminate the telemedicine visit at any time. I understand that I have the right to inspect all information obtained and/or recorded in the course of the telemedicine visit and may receive copies of available information for a reasonable  fee.  I understand that some of the potential risks of receiving the Services via telemedicine include:  Delay or interruption in medical evaluation due to technological equipment failure or disruption; Information transmitted may not be sufficient (e.g. poor resolution of images) to allow for appropriate medical decision making by the Practitioner; and/or  In rare instances, security protocols could fail, causing a breach of personal health information.  Furthermore, I acknowledge that it is my responsibility to provide information about my medical history, conditions and care that is complete and accurate to the best of my ability. I acknowledge that Practitioner's advice, recommendations, and/or decision may be based on factors not within their control, such as incomplete or inaccurate data provided by me or distortions of diagnostic images or specimens that may result from electronic transmissions. I understand that the practice of medicine is not an exact science and that Practitioner makes no warranties or guarantees regarding treatment outcomes. I acknowledge that a copy of this consent can be made available to me via my patient portal ([facility]), or I can request a printed copy by calling the office of [facility].    I understand that my insurance will be billed for this visit.   I have read or had this consent read to me. I understand the contents of this consent, which adequately explains the benefits and risks of the Services being provided via telemedicine.  I have been provided ample opportunity to ask questions regarding this consent and the Services and have had my questions answered to my satisfaction. I give my informed consent for the services to  be provided through the use of telemedicine in my medical care

## 2022-07-27 NOTE — Progress Notes (Signed)
Incoming call from pt's wife Mardene Celeste. Wife states they received a call today from the patient's cardiologist telling them the office had just received the request for cardiac clearance. Pt's surgery is scheduled on Monday 0730. Wife states cardiology told her that the patient would need to be seen for an EKG prior to getting clearance. Surgery needs to be rescheduled until clearance can be obtained. OR desk and Dr. Colleen Can scheduler made aware.

## 2022-07-27 NOTE — Progress Notes (Signed)
Anesthesia APP Follow-up: See Anesthesia APP note by [name], PA-C from date of service 07/23/22. Preoperative telephonic cardiology evaluation added on this afternoon. Per note by Mayra Reel, NP, "Preoperative cardiovascular risk assessment. Unable to complete risk assessment secondary to patient having symptoms of chest pain with and without exertion for the last 3-4 days. He also reports going in and out of afib "a few times." Will have patient contacted by schedulers to get him an office visit as soon as possible.  I will forward this information on to requesting surgeon." I also notified Lorriane Shire at Dr. Colleen Can office since surgery will now need to be postponed until cardiology evaluation is completed.   It does appear that he is being evaluated in person by Eleonore Chiquito, MD on 07/30/22.   Myra Gianotti, PA-C Surgical Short Stay/Anesthesiology [facility] Phone [phone] [facility] Phone [phone] 07/27/2022 4:50 PM

## 2022-07-27 NOTE — Telephone Encounter (Signed)
Our office has just been made aware today that the pt is having surgery Monday 07/30/22. Per the pt's wife the surgeon office knew 2 weeks ago the pt was having surgery. In not delaying the pt's surgery our pre op team has agreed to over book the schedule today. Pt's wife thanked me so much for our help in this matter. Med rec and consent are done.

## 2022-07-27 NOTE — Telephone Encounter (Signed)
   Pre-operative Risk Assessment    Patient Name: Guy Morris  DOB: 03/16/1951 MRN: [id number]      Request for Surgical Clearance    Procedure:   C3-4 ANTERIOR CERVICAL FUSION  Date of Surgery:  Clearance 07/30/22                                 Surgeon:  DR. Emelda Brothers Surgeon's Group or Practice Name:  [facility] Phone number:  [phone] Fax number:  [phone]   Type of Clearance Requested:   - Pharmacy:  Hold Aspirin NOT INDICATED HOW LONG   Type of Anesthesia:  General    Additional requests/questions:    [name]   07/27/2022, 11:37 AM
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
## 2022-07-30 ENCOUNTER — Encounter: Payer: Self-pay | Admitting: Cardiovascular Disease

## 2022-07-30 ENCOUNTER — Ambulatory Visit: Payer: Medicare Other | Attending: Cardiovascular Disease | Admitting: Cardiovascular Disease

## 2022-07-30 ENCOUNTER — Ambulatory Visit (HOSPITAL_COMMUNITY): Admission: RE | Admit: 2022-07-30 | Payer: Medicare Other | Source: Home / Self Care | Admitting: Neurological Surgery

## 2022-07-30 ENCOUNTER — Ambulatory Visit (HOSPITAL_COMMUNITY): Payer: Medicare Other

## 2022-07-30 VITALS — BP 120/86 | Ht 68.0 in | Wt 174.4 lb

## 2022-07-30 DIAGNOSIS — I480 Paroxysmal atrial fibrillation: Secondary | ICD-10-CM | POA: Insufficient documentation

## 2022-07-30 DIAGNOSIS — I6523 Occlusion and stenosis of bilateral carotid arteries: Secondary | ICD-10-CM

## 2022-07-30 DIAGNOSIS — I25810 Atherosclerosis of coronary artery bypass graft(s) without angina pectoris: Secondary | ICD-10-CM | POA: Diagnosis not present

## 2022-07-30 DIAGNOSIS — I739 Peripheral vascular disease, unspecified: Secondary | ICD-10-CM

## 2022-07-30 DIAGNOSIS — R072 Precordial pain: Secondary | ICD-10-CM | POA: Insufficient documentation

## 2022-07-30 SURGERY — ANTERIOR CERVICAL DECOMPRESSION/DISCECTOMY FUSION 1 LEVEL
Anesthesia: General

## 2022-07-30 NOTE — Patient Instructions (Signed)
Medication Instructions:  The current medical regimen is effective;  continue present plan and medications.  *If you need a refill on your cardiac medications before your next appointment, please call your pharmacy*   Lab Work: TSH today   If you have labs (blood work) drawn today and your tests are completely normal, you will receive your results only by: [place] (if you have MyChart) OR A paper copy in the mail If you have any lab test that is abnormal or we need to change your treatment, we will call you to review the results.   Testing/Procedures: Your physician has requested that you have a lexiscan myoview. For further information please visit [email or web address]. Please follow instruction sheet, as given.   Follow-Up: At [facility], you and your health needs are our priority.  As part of our continuing mission to provide you with exceptional heart care, we have created designated Provider Care Teams.  These Care Teams include your primary Cardiologist (physician) and Advanced Practice Providers (APPs -  Physician Assistants and Nurse Practitioners) who all work together to provide you with the care you need, when you need it.  We recommend signing up for the patient portal called "MyChart".  Sign up information is provided on this After Visit Summary.  MyChart is used to connect with patients for Virtual Visits (Telemedicine).  Patients are able to view lab/test results, encounter notes, upcoming appointments, etc.  Non-urgent messages can be sent to your provider as well.   To learn more about what you can do with MyChart, go to [email or web address].    Your next appointment:   As needed  Provider:   Eleonore Chiquito, MD   Keep follow up scheduled with Dr.Hochrein

## 2022-07-31 ENCOUNTER — Other Ambulatory Visit: Payer: Self-pay | Admitting: Internal Medicine

## 2022-07-31 DIAGNOSIS — E039 Hypothyroidism, unspecified: Secondary | ICD-10-CM

## 2022-07-31 LAB — TSH: TSH: 8.54 u[IU]/mL — ABNORMAL HIGH (ref 0.450–4.500)

## 2022-07-31 MED ORDER — LEVOTHYROXINE SODIUM 200 MCG PO TABS: 200.0000 ug | ORAL_TABLET | Freq: Every day | ORAL | 3 refills | Status: DC

## 2022-07-31 NOTE — Telephone Encounter (Signed)
Staff to please inform pt that we needed to increase the thyroid medication from 175 to 200 mcg per day, due to recent lab testing per cardiology  Please ask pt to have f/u Thyroid blood tests done in 4 wks (ok to do at [facility] lab - no appt needed, not fasting)

## 2022-08-01 ENCOUNTER — Telehealth (HOSPITAL_COMMUNITY): Payer: Self-pay | Admitting: *Deleted

## 2022-08-01 NOTE — Telephone Encounter (Signed)
Pt reached and given instructions for MPI study scheduled on 08/06/22.

## 2022-08-02 ENCOUNTER — Ambulatory Visit (INDEPENDENT_AMBULATORY_CARE_PROVIDER_SITE_OTHER): Payer: Medicare Other | Admitting: Pulmonary Disease

## 2022-08-02 ENCOUNTER — Encounter (HOSPITAL_BASED_OUTPATIENT_CLINIC_OR_DEPARTMENT_OTHER): Payer: Self-pay | Admitting: Pulmonary Disease

## 2022-08-02 VITALS — BP 124/68 | HR 47 | Ht 67.0 in | Wt 181.2 lb

## 2022-08-02 DIAGNOSIS — J449 Chronic obstructive pulmonary disease, unspecified: Secondary | ICD-10-CM | POA: Diagnosis not present

## 2022-08-02 DIAGNOSIS — J920 Pleural plaque with presence of asbestos: Secondary | ICD-10-CM | POA: Diagnosis not present

## 2022-08-02 MED ORDER — FLUTICASONE-SALMETEROL 250-50 MCG/ACT IN AEPB: 1.0000 | INHALATION_SPRAY | Freq: Two times a day (BID) | RESPIRATORY_TRACT | 5 refills | Status: DC

## 2022-08-02 MED ORDER — FLUTICASONE-UMECLIDIN-VILANT 200-62.5-25 MCG/ACT IN AEPB: 1.0000 | INHALATION_SPRAY | Freq: Every day | RESPIRATORY_TRACT | 0 refills | Status: DC

## 2022-08-02 NOTE — Progress Notes (Signed)
Subjective:   PATIENT ID: Guy Morris GENDER: male DOB: [birth year], MRN: [id number]  Chief Complaint  Patient presents with   Follow-up    Sob still about the same    Reason for Visit: Follow-up  [name] is a 72 year old male former smoker with COPD, atrial fibrillation s/p ablation in 2021/22, HLD, GERD, BPH, hypothyroidism who presents for follow-up.  Initial consult He reports recent respiratory illness this has improved. He has had gradual shortness of breath in the last year. Wants to be active. Difficulty running errands. Walking shortness distances including going to doctor offices will tire him. Able to grocery shop if he stops and takes frequently breaks. Denies chronic coughing or wheezing. Had covid in 10/2019 and 06/2021.   08/02/22 He continues to have shortness of breath with exertion. However he now walks 8000 steps daily. Exercise does seem to be helping his endurance. Has not been able to pick up Anoro due to cost. Denies cough or wheezing. No exacerbations since our last visit requiring steroids or antibiotics.  Social History: Former smoker. Quit 8 years ago in 2016. 74 pack-years Previously worked as an Clinical biochemist x 30 years. Located at [employer], suspected asbestos exposure   Past Medical History:  Diagnosis Date   ABNORMAL TRANSAMINASE-LFT'S 08/04/2008   Qualifier: Diagnosis of  By: Fuller Plan MD Lamont Snowball T    ALLERGIC RHINITIS 05/06/2007   Qualifier: Diagnosis of  By: Jenny Reichmann MD, Hunt Oris    Allergy    Anginal pain ([place])    Intermittent. Takes Nitro PRN   Atrial fibrillation (HCC)    BPH (benign prostatic hyperplasia) 04/28/2012   COPD (chronic obstructive pulmonary disease) ([place])    Coronary artery disease with history of myocardial infarction without history of CABG 04/11/2021   Degenerative joint disease 04/23/2011   Dysrhythmia    Hx of A.Fib   ERECTILE DYSFUNCTION 05/06/2007   Qualifier: Diagnosis of  By: Jenny Reichmann MD, Hunt Oris    GERD  05/06/2007   Qualifier: Diagnosis of  By: Jenny Reichmann MD, Hunt Oris    HYPERLIPIDEMIA 05/06/2007   Qualifier: Diagnosis of  By: Jenny Reichmann MD, Hunt Oris    Hypertension    HYPOTHYROIDISM 05/06/2007   Qualifier: Diagnosis of  By: Jenny Reichmann MD, Hunt Oris    Insomnia 04/28/2012   Myocardial infarction ([place])    OSTEOARTHRITIS, KNEE, 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   Qualifier: Diagnosis of  By: Jenny Reichmann MD, Hunt Oris    Peripheral vascular disease [facility])    Dr. Hessie Dibble   Personal history of colonic polyps 07/20/2008   Centricity Description: PERSONAL HX COLONIC POLYPS Qualifier: Diagnosis of  By: Ardis Hughs MD, Melene Plan  Centricity Description: COLONIC POLYPS, HX OF Qualifier: Diagnosis of  By: Jenny Reichmann MD, Hunt Oris    Pneumonia    Hx   RLS (restless legs syndrome) 10/17/2010   Urge incontinence 04/28/2012   vesicare heps per urology     Family History  Problem Relation Age of Onset   Cancer Mother        Breast Cancer   Arthritis Mother        RA   Cancer Father        Lung Cancer   Esophageal cancer Father    Pancreatic cancer Maternal Uncle    Stomach cancer Sister    Colon cancer Neg Hx    Colon polyps Neg Hx    Rectal cancer Neg Hx      Social History   Occupational History  Occupation: disabled - DJD knees, neck and shoulders - on SSI  Tobacco Use   Smoking status: Former    Packs/day: 2.00    Years: 37.00    Total pack years: 74.00    Types: Cigarettes    Quit date: 06/11/2000    Years since quitting: 22.1   Smokeless tobacco: Never   Tobacco comments:    quit smoking cigarettes 2002 and quit cigars 2016  Vaping Use   Vaping Use: Never used  Substance and Sexual Activity   Alcohol use: No   Drug use: No   Sexual activity: Not Currently    Allergies  Allergen Reactions   Atorvastatin     myalgia   Claritin [Loratadine]     dizzy   Rofecoxib Itching   Rosuvastatin     myalgia   Zocor [Simvastatin] Other (See Comments)    myalgia     Outpatient Medications Prior to Visit  Medication Sig  Dispense Refill   ascorbic acid (VITAMIN C) 1000 MG tablet Take 1,000 mg by mouth daily.     aspirin EC 81 MG tablet Take 81 mg by mouth daily. Swallow whole.     Ca Carbonate-Mag Hydroxide (ROLAIDS PO) Take 1 tablet by mouth 3 (three) times daily as needed (heartburn).     Cholecalciferol (THERA-D 2000) 50 MCG (2000 UT) TABS 1 tab by mouth once daily (Patient taking differently: Take 1,000 Units by mouth daily.) 30 tablet 99   citalopram (CELEXA) 10 MG tablet Take 1 tablet (10 mg total) by mouth daily. 90 tablet 3   gabapentin (NEURONTIN) 300 MG capsule Take 300 mg by mouth 3 (three) times daily.     levothyroxine (SYNTHROID) 200 MCG tablet Take 1 tablet (200 mcg total) by mouth daily. 90 tablet 3   meclizine (ANTIVERT) 12.5 MG tablet TAKE 1 TABLET BY MOUTH THREE TIMES DAILY AS NEEDED FOR DIZZINESS 30 tablet 0   metoprolol tartrate (LOPRESSOR) 50 MG tablet Take 1/2 tablet ( 25 mg ) twice a day (Patient taking differently: Take 50 mg by mouth daily.) 90 tablet 3   nitroGLYCERIN (NITROSTAT) 0.4 MG SL tablet Place 1 tablet (0.4 mg total) under the tongue every 5 (five) minutes as needed for chest pain. (Patient not taking: Reported on 07/27/2022) 90 tablet 3   omeprazole (PRILOSEC) 20 MG capsule Take 20 mg by mouth daily.     polyethylene glycol (MIRALAX / GLYCOLAX) 17 g packet Take 17 g by mouth daily. 14 each 0   Turmeric Curcumin 500 MG CAPS Take 1,000 mg by mouth daily.     umeclidinium-vilanterol (ANORO ELLIPTA) 62.5-25 MCG/ACT AEPB Inhale 1 puff into the lungs daily. (Patient not taking: Reported on 07/19/2022) 60 each 5   vitamin B-12 (CYANOCOBALAMIN) 1000 MCG tablet Take 1 tablet (1,000 mcg total) by mouth daily. (Patient taking differently: Take 250 mcg by mouth daily.) 90 tablet 1   No facility-administered medications prior to visit.    Review of Systems  Constitutional:  Negative for chills, diaphoresis, fever, malaise/fatigue and weight loss.  HENT:  Negative for congestion.    Respiratory:  Positive for shortness of breath. Negative for cough, hemoptysis, sputum production and wheezing.   Cardiovascular:  Negative for chest pain, palpitations and leg swelling.     Objective:   Vitals:   08/02/22 1048  BP: 124/68  Pulse: (!) 47  SpO2: 98%  Weight: 181 lb 3.2 oz (82.2 kg)  Height: '5\' 7"'$  (1.702 m)     Physical Exam: General: Well-appearing,  no acute distress HENT: [place], AT Eyes: EOMI, no scleral icterus Respiratory: Clear to auscultation bilaterally.  No crackles, wheezing or rales Cardiovascular: RRR, -M/R/G, no JVD Extremities:-Edema,-tenderness Neuro: AAO x4, CNII-XII grossly intact Psych: Normal mood, normal affect  Data Reviewed:  Imaging: CT A/P 06/29/21 - Lower lobes with normal parenchyma CXR 05/22/22 - Right lateral chest with increased nodularity. Pleural plaques. Subtle nodularity in left base CT Chest 07/17/22 - Bilateral pleural plaques, calcified. Emphysema, mild  PFT: 04/25/20 FVC 4.09 (98%) FEV1 2.71 (88%) Ratio 66  Interpretation: Mild obstructive defect based on GOLD's criteria  Labs: CBC    Component Value Date/Time   WBC 5.7 07/23/2022 1014   RBC 4.92 07/23/2022 1014   HGB 15.3 07/23/2022 1014   HGB 15.9 04/21/2020 1142   HCT 46.4 07/23/2022 1014   HCT 46.4 04/21/2020 1142   PLT 242 07/23/2022 1014   PLT 255 04/21/2020 1142   MCV 94.3 07/23/2022 1014   MCV 92 04/21/2020 1142   MCH 31.1 07/23/2022 1014   MCHC 33.0 07/23/2022 1014   RDW 12.4 07/23/2022 1014   RDW 12.1 04/21/2020 1142   LYMPHSABS 2.3 09/12/2021 1031   LYMPHSABS 1.8 04/21/2020 1142   MONOABS 0.7 09/12/2021 1031   EOSABS 0.1 09/12/2021 1031   EOSABS 0.1 04/21/2020 1142   BASOSABS 0.0 09/12/2021 1031   BASOSABS 0.1 04/21/2020 1142   Absolute eos 09/12/21 - 100     Assessment & Plan:   Discussion: 72 year old male former smoker with COPD, atrial fibrillation s/p ablation in 2021/22, HLD, GERD, BPH, hypothyroidism who presents for evaluation for  COPD follow-up. Discussed clinical course and management of COPD including bronchodilator regimen and action plan for exacerbation. I spent >50% visit discussing and reviewing inhaler options with patient and his wife. To prepare for his upcoming surgery will optimize with available bronchodilators as noted below.   COPD  --Currently not on inhalers due to cost --Trelegy sample given. Take this ONCE a day --Pick up your Advair inhaler at [facility]. This inhaler is be taken ONCE in the morning and evening ([employer] discussed. Patient has account) --Please let us know when your surgery will be scheduled. You will need a clinic visit if it is >60 days since your last pulmonary appointment to be cleared  --Encourage regular exercise five days a walk up to 20-30 daily. Uses his pedometer  Pleural plaques --Reviewed CT 07/17/22  Health Maintenance Immunization History  Administered Date(s) Administered   Influenza Split 04/23/2011, 04/28/2012   Influenza Whole 03/12/2007, 04/18/2010   Influenza, High Dose Seasonal PF 03/02/2021   Influenza,inj,Quad PF,6+ Mos 04/30/2013, 04/29/2014, 05/03/2015, 05/08/2016   Influenza-Unspecified 05/13/2017, 03/26/2018, 04/06/2019, 04/05/2020   Janssen (J&J) SARS-COV-2 Vaccination 09/16/2019   Pneumococcal Conjugate-13 11/06/2016   Pneumococcal Polysaccharide-23 11/05/2017   Td 04/11/1994, 09/13/2008   Tdap 08/14/2018   CT Lung Screen - consider in the future after above CT  No orders of the defined types were placed in this encounter.  Meds ordered this encounter  Medications   fluticasone-salmeterol (WIXELA INHUB) 250-50 MCG/ACT AEPB    Sig: Inhale 1 puff into the lungs in the morning and at bedtime.    Dispense:  60 each    Refill:  5   Fluticasone-Umeclidin-Vilant 200-62.5-25 MCG/ACT AEPB    Sig: Inhale 1 puff into the lungs daily.    Dispense:  28 each    Refill:  0    Order Specific Question:   Manufacturer?    Answer:   [employer] [[id number]]  Return in about 2 months (around 10/01/2022).  I have spent a total time of 30-minutes on the day of the appointment including chart review, data review, collecting history, coordinating care and discussing medical diagnosis and plan with the patient/family. Past medical history, allergies, medications were reviewed. Pertinent imaging, labs and tests included in this note have been reviewed and interpreted independently by me.  Ada, MD [facility] 08/02/2022 10:50 AM  Office Number [phone]

## 2022-08-02 NOTE — Patient Instructions (Signed)
COPD  --Currently not on inhalers due to cost --Trelegy sample given. Take this ONCE a day --Pick up your Advair inhaler at [facility]. This inhaler is be taken ONCE in the morning and evening --Please let us know when your surgery will be scheduled. You will need a clinic visit if it is >60 days since your last pulmonary appointment to be cleared   Pleural plaques --Reviewed CT 07/17/22  Follow-up with me in 2 months

## 2022-08-06 ENCOUNTER — Ambulatory Visit (HOSPITAL_COMMUNITY): Payer: Medicare Other | Attending: Cardiology

## 2022-08-06 DIAGNOSIS — R072 Precordial pain: Secondary | ICD-10-CM | POA: Diagnosis not present

## 2022-08-06 LAB — MYOCARDIAL PERFUSION IMAGING
LV dias vol: 106 mL (ref 62–150)
LV sys vol: 53 mL
Nuc Stress EF: 50 %
Peak HR: 57 {beats}/min
Rest HR: 44 {beats}/min
Rest Nuclear Isotope Dose: 10.2 mCi
SDS: 0
SRS: 4
SSS: 4
ST Depression (mm): 0 mm
Stress Nuclear Isotope Dose: 32.4 mCi
TID: 1.08

## 2022-08-06 MED ORDER — TECHNETIUM TC 99M TETROFOSMIN IV KIT
10.2000 | PACK | Freq: Once | INTRAVENOUS | Status: AC | PRN
  Administered 2022-08-06: 10.2 via INTRAVENOUS

## 2022-08-06 MED ORDER — TECHNETIUM TC 99M TETROFOSMIN IV KIT
32.4000 | PACK | Freq: Once | INTRAVENOUS | Status: AC | PRN
  Administered 2022-08-06: 32.4 via INTRAVENOUS

## 2022-08-06 MED ORDER — REGADENOSON 0.4 MG/5ML IV SOLN
0.4000 mg | Freq: Once | INTRAVENOUS | Status: AC
  Administered 2022-08-06: 0.4 mg via INTRAVENOUS

## 2022-08-22 ENCOUNTER — Other Ambulatory Visit: Payer: Self-pay | Admitting: Neurological Surgery

## 2022-08-28 NOTE — Pre-Procedure Instructions (Signed)
Surgical Instructions    Your procedure is scheduled on Tuesday 09/04/22.   Report to [facility] Main Entrance "A" at 05:30 A.M., then check in with the Admitting office.  Call this number if you have problems the morning of surgery:  [phone]   If you have any questions prior to your surgery date call [phone]: Open Monday-Friday 8am-4pm If you experience any cold or flu symptoms such as cough, fever, chills, shortness of breath, etc. between now and your scheduled surgery, please notify us at the above number     Remember:  Do not eat after midnight the night before your surgery  You may drink clear liquids until 04:30 A.M. the morning of your surgery.   Clear liquids allowed are: Water, Non-Citrus Juices (without pulp), Carbonated Beverages, Clear Tea, Black Coffee ONLY (NO MILK, CREAM OR POWDERED CREAMER of any kind), and Gatorade    Take these medicines the morning of surgery with A SIP OF WATER:   gabapentin (NEURONTIN)   levothyroxine (SYNTHROID)   metoprolol tartrate (LOPRESSOR)   citalopram (CELEXA)    Take these medicines if needed:   Fluticasone-Umeclidin-Vilant   meclizine (ANTIVERT)   nitroGLYCERIN (NITROSTAT)   As of today, STOP taking any Aspirin (unless otherwise instructed by your surgeon) Aleve, Naproxen, Ibuprofen, Motrin, Advil, Goody's, BC's, all herbal medications, fish oil, and all vitamins.           Do not wear jewelry or makeup. Do not wear lotions, powders, perfumes/cologne or deodorant. Do not shave 48 hours prior to surgery.  Men may shave face and neck. Do not bring valuables to the hospital. Do not wear nail polish, gel polish, artificial nails, or any other type of covering on natural nails (fingers and toes) If you have artificial nails or gel coating that need to be removed by a nail salon, please have this removed prior to surgery. Artificial nails or gel coating may interfere with anesthesia's ability to adequately monitor your vital  signs.  [facility] is not responsible for any belongings or valuables.    Do NOT Smoke (Tobacco/Vaping)  24 hours prior to your procedure  If you use a CPAP at night, you may bring your mask for your overnight stay.   Contacts, glasses, hearing aids, dentures or partials may not be worn into surgery, please bring cases for these belongings   For patients admitted to the hospital, discharge time will be determined by your treatment team.   Patients discharged the day of surgery will not be allowed to drive home, and someone needs to stay with them for 24 hours.   SURGICAL WAITING ROOM VISITATION Patients having surgery or a procedure may have no more than 2 support people in the waiting area - these visitors may rotate.   Children under the age of 37 must have an adult with them who is not the patient. If the patient needs to stay at the hospital during part of their recovery, the visitor guidelines for inpatient rooms apply. Pre-op nurse will coordinate an appropriate time for 1 support person to accompany patient in pre-op.  This support person may not rotate.   Please refer to [email or web address] for the visitor guidelines for Inpatients (after your surgery is over and you are in a regular room).    Special instructions:    Oral Hygiene is also important to reduce your risk of infection.  Remember - BRUSH YOUR TEETH THE MORNING OF SURGERY WITH YOUR REGULAR TOOTHPASTE   [name]- Preparing For  Surgery  Before surgery, you can play an important role. Because skin is not sterile, your skin needs to be as free of germs as possible. You can reduce the number of germs on your skin by washing with CHG (chlorahexidine gluconate) Soap before surgery.  CHG is an antiseptic cleaner which kills germs and bonds with the skin to continue killing germs even after washing.     Please do not use if you have an allergy to CHG or antibacterial  soaps. If your skin becomes reddened/irritated stop using the CHG.  Do not shave (including legs and underarms) for at least 48 hours prior to first CHG shower. It is OK to shave your face.  Please follow these instructions carefully.     Shower the NIGHT BEFORE SURGERY and the MORNING OF SURGERY with CHG Soap.   If you chose to wash your hair, wash your hair first as usual with your normal shampoo. After you shampoo, rinse your hair and body thoroughly to remove the shampoo.  Then [employer] and genitals (private parts) with your normal soap and rinse thoroughly to remove soap.  After that Use CHG Soap as you would any other liquid soap. You can apply CHG directly to the skin and wash gently with a scrungie or a clean washcloth.   Apply the CHG Soap to your body ONLY FROM THE NECK DOWN.  Do not use on open wounds or open sores. Avoid contact with your eyes, ears, mouth and genitals (private parts). Wash Face and genitals (private parts)  with your normal soap.   Wash thoroughly, paying special attention to the area where your surgery will be performed.  Thoroughly rinse your body with warm water from the neck down.  DO NOT shower/wash with your normal soap after using and rinsing off the CHG Soap.  Pat yourself dry with a CLEAN TOWEL.  Wear CLEAN PAJAMAS to bed the night before surgery  Place CLEAN SHEETS on your bed the night before your surgery  DO NOT SLEEP WITH PETS.   Day of Surgery:  Take a shower with CHG soap. Wear Clean/Comfortable clothing the morning of surgery Do not apply any deodorants/lotions.   Remember to brush your teeth WITH YOUR REGULAR TOOTHPASTE.    If you received a COVID test during your pre-op visit, it is requested that you wear a mask when out in public, stay away from anyone that may not be feeling well, and notify your surgeon if you develop symptoms. If you have been in contact with anyone that has tested positive in the last 10 days, please notify  your surgeon.    Please read over the following fact sheets that you were given.

## 2022-08-29 ENCOUNTER — Encounter (HOSPITAL_COMMUNITY): Payer: Self-pay

## 2022-08-29 ENCOUNTER — Encounter (HOSPITAL_COMMUNITY)
Admission: RE | Admit: 2022-08-29 | Discharge: 2022-08-29 | Disposition: A | Discharge status: Home / Self Care | Payer: Medicare Other | Source: Ambulatory Visit | Attending: Neurological Surgery | Admitting: Neurological Surgery

## 2022-08-29 ENCOUNTER — Other Ambulatory Visit: Payer: Self-pay

## 2022-08-29 VITALS — BP 143/82 | HR 45 | Temp 97.8°F | Resp 17 | Ht 68.0 in | Wt 178.2 lb

## 2022-08-29 DIAGNOSIS — Z951 Presence of aortocoronary bypass graft: Secondary | ICD-10-CM | POA: Diagnosis not present

## 2022-08-29 DIAGNOSIS — R001 Bradycardia, unspecified: Secondary | ICD-10-CM | POA: Diagnosis not present

## 2022-08-29 DIAGNOSIS — Z01818 Encounter for other preprocedural examination: Secondary | ICD-10-CM

## 2022-08-29 DIAGNOSIS — I6529 Occlusion and stenosis of unspecified carotid artery: Secondary | ICD-10-CM | POA: Diagnosis not present

## 2022-08-29 DIAGNOSIS — I2510 Atherosclerotic heart disease of native coronary artery without angina pectoris: Secondary | ICD-10-CM | POA: Diagnosis not present

## 2022-08-29 DIAGNOSIS — Z01812 Encounter for preprocedural laboratory examination: Secondary | ICD-10-CM | POA: Diagnosis not present

## 2022-08-29 DIAGNOSIS — Z87891 Personal history of nicotine dependence: Secondary | ICD-10-CM | POA: Diagnosis not present

## 2022-08-29 LAB — TYPE AND SCREEN
ABO/RH(D): A POS
Antibody Screen: NEGATIVE

## 2022-08-29 LAB — BASIC METABOLIC PANEL
Anion gap: 8 (ref 5–15)
BUN: 11 mg/dL (ref 8–23)
CO2: 25 mmol/L (ref 22–32)
Calcium: 9.8 mg/dL (ref 8.9–10.3)
Chloride: 103 mmol/L (ref 98–111)
Creatinine, Ser: 0.81 mg/dL (ref 0.61–1.24)
GFR, Estimated: >60 mL/min (ref >60)
Glucose, Bld: 105 mg/dL — ABNORMAL HIGH (ref 70–99)
Potassium: 4.3 mmol/L (ref 3.5–5.1)
Sodium: 136 mmol/L (ref 135–145)

## 2022-08-29 LAB — CBC
HCT: 46.4 % (ref 39.0–52.0)
Hemoglobin: 15.5 g/dL (ref 13.0–17.0)
MCH: 31.4 pg (ref 26.0–34.0)
MCHC: 33.4 g/dL (ref 30.0–36.0)
MCV: 94.1 fL (ref 80.0–100.0)
Platelets: 259 10*3/uL (ref 150–400)
RBC: 4.93 MIL/uL (ref 4.22–5.81)
RDW: 12.6 % (ref 11.5–15.5)
WBC: 6.8 10*3/uL (ref 4.0–10.5)
nRBC: 0 % (ref 0.0–0.2)

## 2022-08-29 LAB — SURGICAL PCR SCREEN
MRSA, PCR: NEGATIVE
Staphylococcus aureus: NEGATIVE

## 2022-08-29 NOTE — Progress Notes (Addendum)
PCP - Cathlean Cower Cardiologist - Dr. Shiela Mayer  PPM/ICD - Denies  Chest x-ray - 05/22/22 EKG - 07/30/22 Stress Test - 08/06/22 ECHO - 05/03/20 Cardiac Cath - 04/25/20  Sleep Study - Denies  DM - Denies  Blood Thinner Instructions: N/A Aspirin Instructions: Per patient instructed by surgeon to stop 1 week prior LD 08/28/22   ERAS Protcol - Yes  COVID TEST-  N/A   Anesthesia review: Yes had to reschedule to get cardiac clearance.   Patient denies shortness of breath, fever, cough and chest pain at PAT appointment  Patient stated he had a little cold about 3 weeks ago but no dr. Visit or abx needed. Asymptomatic today. No PNA, COVID, or any other respiratory illness.    All instructions explained to the patient, with a verbal understanding of the material. Patient agrees to go over the instructions while at home for a better understanding. The opportunity to ask questions was provided.

## 2022-08-31 NOTE — Progress Notes (Signed)
Anesthesia Chart Review:  Follows with cardiology for history of atrial fibrillation (not on anticoagulation due to very infrequent nature and low thromboembolic risk), mild to moderate carotid stenosis, CAD s/p CABG 04/2020 with concomitant Maze procedure and clipping of atrial appendage, bradycardia.  Seen by Dr. Davina Poke 07/30/2022 for preop evaluation and nuclear stress test was ordered at that time.  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  Dr. Audie Box commented on result 08/07/2022 stating patient okay to proceed with surgery.   Recently established with pulmonology for management of COPD. 74-pack-year smoking history, quit in 2016.  Seen by Dr. Loanne Drilling on 08/02/2022.  Upcoming surgery discussed.  At that time he was noted to not be on any inhalers due to cost.  In an effort to optimize him for upcoming surgery, he was provided with samples of Trelegy and instructed to use it once daily.  He was also prescribed Advair inhaler and directed to use it twice daily.   Preop labs reviewed, WNL.   EKG 07/30/22: Sinus bradycardia with PACs. Rate 58. Possible inferior infarct, age undetermined.    Nuclear stress 08/06/2022:   Findings are consistent with infarction. The study is low risk (< 10% infarcted myocardium).   No ST deviation was noted.   Left ventricular function is abnormal. Nuclear stress EF: 50 %. The left ventricular ejection fraction is mildly decreased (45-54%). End diastolic cavity size is mildly enlarged. End systolic cavity size is normal.   Prior study available for comparison from 11/09/2015. No changes compared to prior study.   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 LVEF 50%, this may be within normal range for imaging modality.  High-resolution CT chest 07/17/2022: IMPRESSION: 1. Thin bilateral pleural plaques, asymmetrically prominent on the right, a few of  which are calcified on the right, not appreciably changed, compatible with asbestos related pleural disease. No pleural effusions. 2. No evidence of interstitial lung disease. Thin parenchymal bands at the left lung base are significantly decreased from prior chest CT, favoring minimal postinfectious/postinflammatory scarring. 3. Mild emphysema with diffuse bronchial wall thickening, suggesting COPD. 4. Aortic Atherosclerosis (ICD10-I70.0) and Emphysema (ICD10-J43.9).   Carotid duplex 04/20/2022: Summary:  Right Carotid: Velocities in the right ICA are consistent with a 1-39% stenosis.  Left Carotid: Velocities in the left ICA are consistent with a 40-59% stenosis.  Vertebrals: Right vertebral artery demonstrates antegrade flow. Left vertebral artery demonstrates no discernable flow.  Subclavians: Normal flow hemodynamics were seen in bilateral subclavian arteries.    TTE 05/03/2020:  1. Left ventricular ejection fraction, by estimation, is 50 to 55%. The  left ventricle has low normal function. The left ventricle demonstrates  regional wall motion abnormalities (see scoring diagram/findings for  description). Left ventricular diastolic   function could not be evaluated. There is moderate hypokinesis of the  left ventricular, basal-mid inferolateral wall.   2. Right ventricular systolic function is normal. The right ventricular  size is normal. Tricuspid regurgitation signal is inadequate for assessing  PA pressure.   3. The mitral valve is normal in structure. No evidence of mitral valve  regurgitation. No evidence of mitral stenosis.   4. The aortic valve is normal in structure. Aortic valve regurgitation is  not visualized. No aortic stenosis is present.   Comparison(s): A prior study was performed on 04/25/2020. No significant  change from prior study. Prior images reviewed side by side. The  inferolateral wall motion abnormality was also present  on the previous  study.  Postoperative paradoxical septal  motion is new.        Wynonia Musty [facility]/Anesthesiology Phone [phone] 08/31/2022 10:51 AM

## 2022-08-31 NOTE — Anesthesia Preprocedure Evaluation (Signed)
Anesthesia Evaluation  Patient identified by MRN, date of birth, ID band Patient awake    Reviewed: Allergy & Precautions, NPO status , Patient's Chart, lab work & pertinent test results  History of Anesthesia Complications Negative for: history of anesthetic complications  Airway Mallampati: III  TM Distance: >3 FB Neck ROM: Limited   Comment: Previous grade II view with MAC 4, easy mask Dental  (+) Dental Advisory Given, Poor Dentition   Pulmonary neg shortness of breath, neg sleep apnea, COPD (used Trelegy this morning),  COPD inhaler, Recent URI  (2 weeks ago), Resolved, former smoker   Pulmonary exam normal breath sounds clear to auscultation       Cardiovascular hypertension (metoprolol), Pt. on home beta blockers (-) angina + CAD, + Past MI, + CABG (04/28/2020) and + Peripheral Vascular Disease  + dysrhythmias Atrial Fibrillation  Rhythm:Regular Rate:Normal  HLD, bilateral carotid artery stenosis  Myocardial Perfusion Imaging 08/06/2022:   Findings are consistent with infarction. The study is low risk (< 10% infarcted myocardium).   No ST deviation was noted.   Left ventricular function is abnormal. Nuclear stress EF: 50 %. The left ventricular ejection fraction is mildly decreased (45-54%). End diastolic cavity size is mildly enlarged. End systolic cavity size is normal.   Prior study available for comparison from 11/09/2015. No changes compared to prior study.   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 LVEF 50%, this may be within normal range for imaging modality.  Carotid Duplex 04/20/2022: Summary:  Right Carotid: Velocities in the right ICA are consistent with a 1-39%  stenosis.   Left Carotid: Velocities in the left ICA are consistent with a 40-59%  stenosis.   Vertebrals: Right vertebral artery demonstrates antegrade flow. Left  vertebral              artery demonstrates no  discernable flow.  Subclavians: Normal flow hemodynamics were seen in bilateral subclavian               arteries.   TTE 05/03/2020: IMPRESSIONS     1. Left ventricular ejection fraction, by estimation, is 50 to 55%. The  left ventricle has low normal function. The left ventricle demonstrates  regional wall motion abnormalities (see scoring diagram/findings for  description). Left ventricular diastolic   function could not be evaluated. There is moderate hypokinesis of the  left ventricular, basal-mid inferolateral wall.   2. Right ventricular systolic function is normal. The right ventricular  size is normal. Tricuspid regurgitation signal is inadequate for assessing  PA pressure.   3. The mitral valve is normal in structure. No evidence of mitral valve  regurgitation. No evidence of mitral stenosis.   4. The aortic valve is normal in structure. Aortic valve regurgitation is  not visualized. No aortic stenosis is present.     Neuro/Psych neg Seizures PSYCHIATRIC DISORDERS  Depression    Chronic pain syndrome, vertigo  Neuromuscular disease (cervical spondylosis)    GI/Hepatic Neg liver ROS,GERD  Medicated,,  Endo/Other  neg diabetesHypothyroidism    Renal/GU negative Renal ROS     Musculoskeletal  (+) Arthritis , Osteoarthritis,    Abdominal   Peds  Hematology  (+) Blood dyscrasia (polycythemia)   Anesthesia Other Findings   Reproductive/Obstetrics                             Anesthesia Physical Anesthesia Plan  ASA: 3  Anesthesia Plan: General   Post-op Pain  Management: Tylenol PO (pre-op)*   Induction: Intravenous  PONV Risk Score and Plan: 2 and Ondansetron, Propofol infusion and Treatment may vary due to age or medical condition  Airway Management Planned: Oral ETT and Video Laryngoscope Planned  Additional Equipment:   Intra-op Plan:   Post-operative Plan: Extubation in OR  Informed Consent: I have reviewed the patients  History and Physical, chart, labs and discussed the procedure including the risks, benefits and alternatives for the proposed anesthesia with the patient or authorized representative who has indicated his/her understanding and acceptance.     Dental advisory given  Plan Discussed with: CRNA and Anesthesiologist  Anesthesia Plan Comments: (Risks of general anesthesia discussed including, but not limited to, sore throat, hoarse voice, chipped/damaged teeth, injury to vocal cords, nausea and vomiting, allergic reactions, lung infection, heart attack, stroke, and death. All questions answered.   PAT note by [name], PA-C: Follows with cardiology for history of atrial fibrillation (not on anticoagulation due to very infrequent nature and low thromboembolic risk), mild to moderate carotid stenosis, CAD s/p CABG 04/2020 with concomitant Maze procedure and clipping of atrial appendage, bradycardia.  Seen by Dr. Davina Poke 07/30/2022 for preop evaluation and nuclear stress test was ordered at that time.  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  Dr. Audie Box commented on result 08/07/2022 stating patient okay to proceed with surgery.  Recently established with pulmonology for management of COPD. 74-pack-year smoking history, quit in 2016.  Seen by Dr. Loanne Drilling on 08/02/2022.  Upcoming surgery discussed.  At that time he was noted to not be on any inhalers due to cost.  In an effort to optimize him for upcoming surgery, he was provided with samples of Trelegy and instructed to use it once daily.  He was also prescribed Advair inhaler and directed to use it twice daily.  Preop labs reviewed, WNL.  EKG 07/30/22: Sinus bradycardia with PACs. Rate 58. Possible inferior infarct, age undetermined.   Nuclear stress 08/06/2022:   Findings are consistent with infarction. The study is low risk (< 10% infarcted myocardium).   No  ST deviation was noted.   Left ventricular function is abnormal. Nuclear stress EF: 50 %. The left ventricular ejection fraction is mildly decreased (45-54%). End diastolic cavity size is mildly enlarged. End systolic cavity size is normal.   Prior study available for comparison from 11/09/2015. No changes compared to prior study.  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 LVEF 50%, this may be within normal range for imaging modality.  High-resolution CT chest 07/17/2022: IMPRESSION: 1. Thin bilateral pleural plaques, asymmetrically prominent on the right, a few of which are calcified on the right, not appreciably changed, compatible with asbestos related pleural disease. No pleural effusions. 2. No evidence of interstitial lung disease. Thin parenchymal bands at the left lung base are significantly decreased from prior chest CT, favoring minimal postinfectious/postinflammatory scarring. 3. Mild emphysema with diffuse bronchial wall thickening, suggesting COPD. 4. Aortic Atherosclerosis (ICD10-I70.0) and Emphysema (ICD10-J43.9).  Carotid duplex 04/20/2022: Summary:  Right Carotid: Velocities in the right ICA are consistent with a 1-39% stenosis.  Left Carotid: Velocities in the left ICA are consistent with a 40-59% stenosis.  Vertebrals:Right vertebral artery demonstrates antegrade flow. Left vertebral artery demonstrates no discernable flow.  Subclavians: Normal flow hemodynamics were seen in bilateral subclavian arteries.   TTE 05/03/2020: 1. Left ventricular ejection fraction, by estimation, is 50 to 55%. The  left ventricle has  low normal function. The left ventricle demonstrates  regional wall motion abnormalities (see scoring diagram/findings for  description). Left ventricular diastolic  function could not be evaluated. There is moderate hypokinesis of the  left ventricular, basal-mid inferolateral wall.  2. Right ventricular systolic  function is normal. The right ventricular  size is normal. Tricuspid regurgitation signal is inadequate for assessing  PA pressure.  3. The mitral valve is normal in structure. No evidence of mitral valve  regurgitation. No evidence of mitral stenosis.  4. The aortic valve is normal in structure. Aortic valve regurgitation is  not visualized. No aortic stenosis is present.   Comparison(s): A prior study was performed on 04/25/2020. No significant  change from prior study. Prior images reviewed side by side. The  inferolateral wall motion abnormality was also present on the previous  study. Postoperative paradoxical septal  motion is new.    )        Anesthesia Quick Evaluation

## 2022-09-04 ENCOUNTER — Other Ambulatory Visit: Payer: Self-pay

## 2022-09-04 ENCOUNTER — Ambulatory Visit (HOSPITAL_COMMUNITY): Payer: Medicare Other | Admitting: Vascular Surgery

## 2022-09-04 ENCOUNTER — Ambulatory Visit (HOSPITAL_COMMUNITY): Payer: Medicare Other

## 2022-09-04 ENCOUNTER — Encounter (HOSPITAL_COMMUNITY): Payer: Self-pay | Admitting: Neurological Surgery

## 2022-09-04 ENCOUNTER — Ambulatory Visit (HOSPITAL_BASED_OUTPATIENT_CLINIC_OR_DEPARTMENT_OTHER): Payer: Medicare Other

## 2022-09-04 ENCOUNTER — Observation Stay (HOSPITAL_COMMUNITY)
Admission: RE | Admit: 2022-09-04 | Discharge: 2022-09-04 | Disposition: A | Discharge status: Home / Self Care | Payer: Medicare Other | Attending: Neurological Surgery | Admitting: Neurological Surgery

## 2022-09-04 ENCOUNTER — Encounter (HOSPITAL_COMMUNITY)
Admission: RE | Disposition: A | Discharge status: Home / Self Care | Payer: Self-pay | Source: Home / Self Care | Attending: Neurological Surgery

## 2022-09-04 DIAGNOSIS — I4891 Unspecified atrial fibrillation: Secondary | ICD-10-CM | POA: Diagnosis not present

## 2022-09-04 DIAGNOSIS — G959 Disease of spinal cord, unspecified: Secondary | ICD-10-CM

## 2022-09-04 DIAGNOSIS — I2510 Atherosclerotic heart disease of native coronary artery without angina pectoris: Secondary | ICD-10-CM | POA: Insufficient documentation

## 2022-09-04 DIAGNOSIS — Z79899 Other long term (current) drug therapy: Secondary | ICD-10-CM | POA: Diagnosis not present

## 2022-09-04 DIAGNOSIS — E039 Hypothyroidism, unspecified: Secondary | ICD-10-CM | POA: Insufficient documentation

## 2022-09-04 DIAGNOSIS — I10 Essential (primary) hypertension: Secondary | ICD-10-CM | POA: Diagnosis not present

## 2022-09-04 DIAGNOSIS — M4326 Fusion of spine, lumbar region: Secondary | ICD-10-CM | POA: Diagnosis not present

## 2022-09-04 DIAGNOSIS — M4802 Spinal stenosis, cervical region: Secondary | ICD-10-CM | POA: Diagnosis not present

## 2022-09-04 DIAGNOSIS — Z951 Presence of aortocoronary bypass graft: Secondary | ICD-10-CM | POA: Diagnosis not present

## 2022-09-04 DIAGNOSIS — Z87891 Personal history of nicotine dependence: Secondary | ICD-10-CM | POA: Insufficient documentation

## 2022-09-04 DIAGNOSIS — I252 Old myocardial infarction: Secondary | ICD-10-CM

## 2022-09-04 DIAGNOSIS — J449 Chronic obstructive pulmonary disease, unspecified: Secondary | ICD-10-CM | POA: Diagnosis not present

## 2022-09-04 DIAGNOSIS — M4712 Other spondylosis with myelopathy, cervical region: Secondary | ICD-10-CM | POA: Diagnosis not present

## 2022-09-04 DIAGNOSIS — Z981 Arthrodesis status: Secondary | ICD-10-CM | POA: Diagnosis not present

## 2022-09-04 DIAGNOSIS — G992 Myelopathy in diseases classified elsewhere: Secondary | ICD-10-CM | POA: Diagnosis not present

## 2022-09-04 HISTORY — PX: ANTERIOR CERVICAL DECOMP/DISCECTOMY FUSION: SHX1161

## 2022-09-04 SURGERY — ANTERIOR CERVICAL DECOMPRESSION/DISCECTOMY FUSION 1 LEVEL
Anesthesia: General

## 2022-09-04 MED ORDER — CHLORHEXIDINE GLUCONATE CLOTH 2 % EX PADS: 6.0000 | MEDICATED_PAD | Freq: Once | CUTANEOUS | Status: DC

## 2022-09-04 MED ORDER — SODIUM CHLORIDE 0.9 % IV SOLN: 250.0000 mL | INTRAVENOUS | Status: DC

## 2022-09-04 MED ORDER — AMISULPRIDE (ANTIEMETIC) 5 MG/2ML IV SOLN: 10.0000 mg | Freq: Once | INTRAVENOUS | Status: DC | PRN

## 2022-09-04 MED ORDER — OXYCODONE HCL 5 MG PO TABS: 5.0000 mg | ORAL_TABLET | ORAL | 0 refills | Status: DC | PRN

## 2022-09-04 MED ORDER — POLYETHYLENE GLYCOL 3350 17 G PO PACK: 17.0000 g | PACK | Freq: Every day | ORAL | Status: DC | PRN

## 2022-09-04 MED ORDER — THROMBIN 5000 UNITS EX SOLR
OROMUCOSAL | Status: DC | PRN
  Administered 2022-09-04: 5 mL via TOPICAL

## 2022-09-04 MED ORDER — ONDANSETRON HCL 4 MG PO TABS: 4.0000 mg | ORAL_TABLET | Freq: Four times a day (QID) | ORAL | Status: DC | PRN

## 2022-09-04 MED ORDER — OXYCODONE HCL 5 MG PO TABS: 5.0000 mg | ORAL_TABLET | ORAL | Status: DC | PRN

## 2022-09-04 MED ORDER — UMECLIDINIUM BROMIDE 62.5 MCG/ACT IN AEPB
1.0000 | INHALATION_SPRAY | Freq: Every day | RESPIRATORY_TRACT | Status: DC
  Filled 2022-09-04: qty 7

## 2022-09-04 MED ORDER — LEVOTHYROXINE SODIUM 100 MCG PO TABS: 200.0000 ug | ORAL_TABLET | Freq: Every day | ORAL | Status: DC

## 2022-09-04 MED ORDER — 0.9 % SODIUM CHLORIDE (POUR BTL) OPTIME
TOPICAL | Status: DC | PRN
  Administered 2022-09-04: 1000 mL

## 2022-09-04 MED ORDER — GLYCOPYRROLATE 0.2 MG/ML IJ SOLN
INTRAMUSCULAR | Status: DC | PRN
  Administered 2022-09-04 (×2): .1 mg via INTRAVENOUS

## 2022-09-04 MED ORDER — SUGAMMADEX SODIUM 200 MG/2ML IV SOLN
INTRAVENOUS | Status: DC | PRN
  Administered 2022-09-04: 200 mg via INTRAVENOUS

## 2022-09-04 MED ORDER — CEFAZOLIN SODIUM-DEXTROSE 2-4 GM/100ML-% IV SOLN
2.0000 g | Freq: Three times a day (TID) | INTRAVENOUS | Status: DC
  Administered 2022-09-04: 2 g via INTRAVENOUS
  Filled 2022-09-04: qty 100

## 2022-09-04 MED ORDER — HYDROMORPHONE HCL 1 MG/ML IJ SOLN: 1.0000 mg | INTRAMUSCULAR | Status: DC | PRN

## 2022-09-04 MED ORDER — LIDOCAINE 2% (20 MG/ML) 5 ML SYRINGE
INTRAMUSCULAR | Status: DC | PRN
  Administered 2022-09-04: 100 mg via INTRAVENOUS

## 2022-09-04 MED ORDER — DEXAMETHASONE SODIUM PHOSPHATE 10 MG/ML IJ SOLN
INTRAMUSCULAR | Status: AC
  Filled 2022-09-04: qty 1

## 2022-09-04 MED ORDER — DOCUSATE SODIUM 100 MG PO CAPS
100.0000 mg | ORAL_CAPSULE | Freq: Two times a day (BID) | ORAL | Status: DC
  Administered 2022-09-04: 100 mg via ORAL
  Filled 2022-09-04: qty 1

## 2022-09-04 MED ORDER — SODIUM CHLORIDE 0.9% FLUSH: 3.0000 mL | INTRAVENOUS | Status: DC | PRN

## 2022-09-04 MED ORDER — CITALOPRAM HYDROBROMIDE 20 MG PO TABS: 10.0000 mg | ORAL_TABLET | Freq: Every day | ORAL | Status: DC

## 2022-09-04 MED ORDER — FLUTICASONE FUROATE-VILANTEROL 200-25 MCG/ACT IN AEPB
1.0000 | INHALATION_SPRAY | Freq: Every day | RESPIRATORY_TRACT | Status: DC
  Filled 2022-09-04: qty 28

## 2022-09-04 MED ORDER — CYCLOBENZAPRINE HCL 10 MG PO TABS: 10.0000 mg | ORAL_TABLET | Freq: Three times a day (TID) | ORAL | 0 refills | Status: DC | PRN

## 2022-09-04 MED ORDER — POLYETHYLENE GLYCOL 3350 17 G PO PACK
17.0000 g | PACK | Freq: Every day | ORAL | Status: DC
  Administered 2022-09-04: 17 g via ORAL
  Filled 2022-09-04: qty 1

## 2022-09-04 MED ORDER — CYCLOBENZAPRINE HCL 10 MG PO TABS
10.0000 mg | ORAL_TABLET | Freq: Three times a day (TID) | ORAL | Status: DC | PRN
  Administered 2022-09-04: 10 mg via ORAL
  Filled 2022-09-04: qty 1

## 2022-09-04 MED ORDER — OXYCODONE HCL 5 MG PO TABS
10.0000 mg | ORAL_TABLET | ORAL | Status: DC | PRN
  Administered 2022-09-04: 10 mg via ORAL
  Filled 2022-09-04: qty 2

## 2022-09-04 MED ORDER — PANTOPRAZOLE SODIUM 20 MG PO TBEC
20.0000 mg | DELAYED_RELEASE_TABLET | Freq: Every day | ORAL | Status: DC
  Administered 2022-09-04: 20 mg via ORAL
  Filled 2022-09-04: qty 1

## 2022-09-04 MED ORDER — CEFAZOLIN SODIUM-DEXTROSE 2-4 GM/100ML-% IV SOLN
2.0000 g | INTRAVENOUS | Status: AC
  Administered 2022-09-04: 2 g via INTRAVENOUS
  Filled 2022-09-04: qty 100

## 2022-09-04 MED ORDER — LIDOCAINE-EPINEPHRINE 1 %-1:100000 IJ SOLN
INTRAMUSCULAR | Status: AC
  Filled 2022-09-04: qty 1

## 2022-09-04 MED ORDER — PROPOFOL 10 MG/ML IV BOLUS
INTRAVENOUS | Status: AC
  Filled 2022-09-04: qty 20

## 2022-09-04 MED ORDER — MIDAZOLAM HCL 2 MG/2ML IJ SOLN
INTRAMUSCULAR | Status: DC | PRN
  Administered 2022-09-04: 2 mg via INTRAVENOUS

## 2022-09-04 MED ORDER — ACETAMINOPHEN 500 MG PO TABS
1000.0000 mg | ORAL_TABLET | Freq: Once | ORAL | Status: AC
  Administered 2022-09-04: 1000 mg via ORAL
  Filled 2022-09-04: qty 2

## 2022-09-04 MED ORDER — ACETAMINOPHEN 650 MG RE SUPP: 650.0000 mg | RECTAL | Status: DC | PRN

## 2022-09-04 MED ORDER — MIDAZOLAM HCL 2 MG/2ML IJ SOLN
INTRAMUSCULAR | Status: AC
  Filled 2022-09-04: qty 2

## 2022-09-04 MED ORDER — METOPROLOL TARTRATE 50 MG PO TABS: 25.0000 mg | ORAL_TABLET | Freq: Two times a day (BID) | ORAL | Status: DC

## 2022-09-04 MED ORDER — ACETAMINOPHEN 10 MG/ML IV SOLN
INTRAVENOUS | Status: AC
  Filled 2022-09-04: qty 100

## 2022-09-04 MED ORDER — FENTANYL CITRATE (PF) 100 MCG/2ML IJ SOLN
25.0000 ug | INTRAMUSCULAR | Status: DC | PRN
  Administered 2022-09-04: 50 ug via INTRAVENOUS
  Administered 2022-09-04: 25 ug via INTRAVENOUS

## 2022-09-04 MED ORDER — DEXAMETHASONE SODIUM PHOSPHATE 10 MG/ML IJ SOLN
INTRAMUSCULAR | Status: DC | PRN
  Administered 2022-09-04: 10 mg via INTRAVENOUS

## 2022-09-04 MED ORDER — NITROGLYCERIN 0.4 MG SL SUBL: 0.4000 mg | SUBLINGUAL_TABLET | SUBLINGUAL | Status: DC | PRN

## 2022-09-04 MED ORDER — OXYCODONE HCL 5 MG/5ML PO SOLN: 5.0000 mg | Freq: Once | ORAL | Status: AC | PRN

## 2022-09-04 MED ORDER — FENTANYL CITRATE (PF) 100 MCG/2ML IJ SOLN
INTRAMUSCULAR | Status: AC
  Filled 2022-09-04: qty 2

## 2022-09-04 MED ORDER — ROCURONIUM BROMIDE 10 MG/ML (PF) SYRINGE
PREFILLED_SYRINGE | INTRAVENOUS | Status: DC | PRN
  Administered 2022-09-04: 50 mg via INTRAVENOUS
  Administered 2022-09-04 (×2): 20 mg via INTRAVENOUS

## 2022-09-04 MED ORDER — OXYCODONE HCL 5 MG PO TABS
ORAL_TABLET | ORAL | Status: AC
  Filled 2022-09-04: qty 1

## 2022-09-04 MED ORDER — PHENYLEPHRINE HCL-NACL 20-0.9 MG/250ML-% IV SOLN
INTRAVENOUS | Status: DC | PRN
  Administered 2022-09-04: 40 ug/min via INTRAVENOUS

## 2022-09-04 MED ORDER — OXYCODONE HCL 5 MG PO TABS
5.0000 mg | ORAL_TABLET | Freq: Once | ORAL | Status: AC | PRN
  Administered 2022-09-04: 5 mg via ORAL

## 2022-09-04 MED ORDER — MENTHOL 3 MG MT LOZG: 1.0000 | LOZENGE | OROMUCOSAL | Status: DC | PRN

## 2022-09-04 MED ORDER — THROMBIN 5000 UNITS EX SOLR
CUTANEOUS | Status: AC
  Filled 2022-09-04: qty 5000

## 2022-09-04 MED ORDER — LACTATED RINGERS IV SOLN: INTRAVENOUS | Status: DC

## 2022-09-04 MED ORDER — SODIUM CHLORIDE 0.9% FLUSH: 3.0000 mL | Freq: Two times a day (BID) | INTRAVENOUS | Status: DC

## 2022-09-04 MED ORDER — PHENOL 1.4 % MT LIQD: 1.0000 | OROMUCOSAL | Status: DC | PRN

## 2022-09-04 MED ORDER — PROPOFOL 500 MG/50ML IV EMUL
INTRAVENOUS | Status: DC | PRN
  Administered 2022-09-04: 50 ug/kg/min via INTRAVENOUS

## 2022-09-04 MED ORDER — ONDANSETRON HCL 4 MG/2ML IJ SOLN
INTRAMUSCULAR | Status: AC
  Filled 2022-09-04: qty 2

## 2022-09-04 MED ORDER — LIDOCAINE 2% (20 MG/ML) 5 ML SYRINGE
INTRAMUSCULAR | Status: AC
  Filled 2022-09-04: qty 5

## 2022-09-04 MED ORDER — PHENYLEPHRINE 80 MCG/ML (10ML) SYRINGE FOR IV PUSH (FOR BLOOD PRESSURE SUPPORT)
PREFILLED_SYRINGE | INTRAVENOUS | Status: DC | PRN
  Administered 2022-09-04: 160 ug via INTRAVENOUS

## 2022-09-04 MED ORDER — MECLIZINE HCL 12.5 MG PO TABS: 12.5000 mg | ORAL_TABLET | Freq: Three times a day (TID) | ORAL | Status: DC | PRN

## 2022-09-04 MED ORDER — ROCURONIUM BROMIDE 10 MG/ML (PF) SYRINGE
PREFILLED_SYRINGE | INTRAVENOUS | Status: AC
  Filled 2022-09-04: qty 10

## 2022-09-04 MED ORDER — CHLORHEXIDINE GLUCONATE 0.12 % MT SOLN
15.0000 mL | Freq: Once | OROMUCOSAL | Status: AC
  Administered 2022-09-04: 15 mL via OROMUCOSAL
  Filled 2022-09-04: qty 15

## 2022-09-04 MED ORDER — PROPOFOL 10 MG/ML IV BOLUS
INTRAVENOUS | Status: DC | PRN
  Administered 2022-09-04: 30 mg via INTRAVENOUS
  Administered 2022-09-04: 50 mg via INTRAVENOUS
  Administered 2022-09-04: 100 mg via INTRAVENOUS

## 2022-09-04 MED ORDER — FENTANYL CITRATE (PF) 250 MCG/5ML IJ SOLN
INTRAMUSCULAR | Status: DC | PRN
  Administered 2022-09-04 (×2): 50 ug via INTRAVENOUS
  Administered 2022-09-04: 100 ug via INTRAVENOUS
  Administered 2022-09-04: 50 ug via INTRAVENOUS

## 2022-09-04 MED ORDER — SUCCINYLCHOLINE CHLORIDE 200 MG/10ML IV SOSY
PREFILLED_SYRINGE | INTRAVENOUS | Status: AC
  Filled 2022-09-04: qty 10

## 2022-09-04 MED ORDER — LACTATED RINGERS IV SOLN: INTRAVENOUS | Status: DC | PRN

## 2022-09-04 MED ORDER — ONDANSETRON HCL 4 MG/2ML IJ SOLN: 4.0000 mg | Freq: Four times a day (QID) | INTRAMUSCULAR | Status: DC | PRN

## 2022-09-04 MED ORDER — MOMETASONE FURO-FORMOTEROL FUM 200-5 MCG/ACT IN AERO: 2.0000 | INHALATION_SPRAY | Freq: Two times a day (BID) | RESPIRATORY_TRACT | Status: DC

## 2022-09-04 MED ORDER — GABAPENTIN 300 MG PO CAPS
300.0000 mg | ORAL_CAPSULE | Freq: Two times a day (BID) | ORAL | Status: DC
  Administered 2022-09-04: 300 mg via ORAL
  Filled 2022-09-04: qty 1

## 2022-09-04 MED ORDER — FENTANYL CITRATE (PF) 250 MCG/5ML IJ SOLN
INTRAMUSCULAR | Status: AC
  Filled 2022-09-04: qty 5

## 2022-09-04 MED ORDER — LIDOCAINE-EPINEPHRINE 1 %-1:100000 IJ SOLN
INTRAMUSCULAR | Status: DC | PRN
  Administered 2022-09-04: 7 mL via INTRADERMAL

## 2022-09-04 MED ORDER — ORAL CARE MOUTH RINSE: 15.0000 mL | Freq: Once | OROMUCOSAL | Status: AC

## 2022-09-04 MED ORDER — ONDANSETRON HCL 4 MG/2ML IJ SOLN
INTRAMUSCULAR | Status: DC | PRN
  Administered 2022-09-04: 4 mg via INTRAVENOUS

## 2022-09-04 MED ORDER — ACETAMINOPHEN 325 MG PO TABS: 650.0000 mg | ORAL_TABLET | ORAL | Status: DC | PRN

## 2022-09-04 SURGICAL SUPPLY — 57 items
ADH SKN CLS APL DERMABOND .7 (GAUZE/BANDAGES/DRESSINGS) ×1
APL SKNCLS STERI-STRIP NONHPOA (GAUZE/BANDAGES/DRESSINGS)
BAG COUNTER SPONGE SURGICOUNT (BAG) ×1 IMPLANT
BAG SPNG CNTER NS LX DISP (BAG) ×2
BAND INSRT 18 STRL LF DISP RB (MISCELLANEOUS) ×2
BAND RUBBER #18 3X1/16 STRL (MISCELLANEOUS) ×2 IMPLANT
BENZOIN TINCTURE PRP APPL 2/3 (GAUZE/BANDAGES/DRESSINGS) IMPLANT
BLADE CLIPPER SURG (BLADE) IMPLANT
BLADE SURG 11 STRL SS (BLADE) ×1 IMPLANT
BUR MATCHSTICK NEURO 3.0 LAGG (BURR) ×1 IMPLANT
CANISTER SUCT 3000ML PPV (MISCELLANEOUS) ×1 IMPLANT
DERMABOND ADVANCED .7 DNX12 (GAUZE/BANDAGES/DRESSINGS) ×1 IMPLANT
DRAPE C-ARM 42X72 X-RAY (DRAPES) ×2 IMPLANT
DRAPE HALF SHEET 40X57 (DRAPES) IMPLANT
DRAPE LAPAROTOMY 100X72 PEDS (DRAPES) ×1 IMPLANT
DRAPE MICROSCOPE SLANT 54X150 (MISCELLANEOUS) ×1 IMPLANT
DURAPREP 6ML APPLICATOR 50/CS (WOUND CARE) ×1 IMPLANT
ELECT COATED BLADE 2.86 ST (ELECTRODE) ×1 IMPLANT
ELECT REM PT RETURN 9FT ADLT (ELECTROSURGICAL) ×1
ELECTRODE REM PT RTRN 9FT ADLT (ELECTROSURGICAL) ×1 IMPLANT
GAUZE 4X4 16PLY ~~LOC~~+RFID DBL (SPONGE) IMPLANT
GLOVE BIOGEL PI IND STRL 7.5 (GLOVE) ×2 IMPLANT
GLOVE ECLIPSE 7.5 STRL STRAW (GLOVE) ×1 IMPLANT
GLOVE EXAM NITRILE LRG STRL (GLOVE) IMPLANT
GLOVE EXAM NITRILE XL STR (GLOVE) IMPLANT
GLOVE EXAM NITRILE XS STR PU (GLOVE) IMPLANT
GOWN STRL REUS W/ TWL LRG LVL3 (GOWN DISPOSABLE) ×2 IMPLANT
GOWN STRL REUS W/ TWL XL LVL3 (GOWN DISPOSABLE) IMPLANT
GOWN STRL REUS W/TWL 2XL LVL3 (GOWN DISPOSABLE) IMPLANT
GOWN STRL REUS W/TWL LRG LVL3 (GOWN DISPOSABLE) ×2
GOWN STRL REUS W/TWL XL LVL3 (GOWN DISPOSABLE)
HEMOSTAT POWDER KIT SURGIFOAM (HEMOSTASIS) ×1 IMPLANT
KIT BASIN OR (CUSTOM PROCEDURE TRAY) ×1 IMPLANT
KIT TURNOVER KIT B (KITS) ×1 IMPLANT
NDL SPNL 18GX3.5 QUINCKE PK (NEEDLE) ×1 IMPLANT
NEEDLE HYPO 22GX1.5 SAFETY (NEEDLE) ×1 IMPLANT
NEEDLE SPNL 18GX3.5 QUINCKE PK (NEEDLE) ×1 IMPLANT
NS IRRIG 1000ML POUR BTL (IV SOLUTION) ×1 IMPLANT
PACK LAMINECTOMY NEURO (CUSTOM PROCEDURE TRAY) ×1 IMPLANT
PAD ARMBOARD 7.5X6 YLW CONV (MISCELLANEOUS) ×3 IMPLANT
PIN DISTRACTION 14MM (PIN) IMPLANT
PLATE ANT CERV ATLANTIS 19 (Plate) IMPLANT
SCREW SELF TAP VAR 4.0X13 (Screw) IMPLANT
SET WALTER ACTIVATION W/DRAPE (SET/KITS/TRAYS/PACK) IMPLANT
SOL ELECTROSURG ANTI STICK (MISCELLANEOUS)
SOLUTION ELECTROSURG ANTI STCK (MISCELLANEOUS) ×1 IMPLANT
SPACER BONE CORNERSTONE 6X14 (Orthopedic Implant) IMPLANT
SPIKE FLUID TRANSFER (MISCELLANEOUS) ×1 IMPLANT
SPONGE INTESTINAL PEANUT (DISPOSABLE) ×1 IMPLANT
SPONGE SURGIFOAM ABS GEL SZ50 (HEMOSTASIS) IMPLANT
STAPLER VISISTAT 35W (STAPLE) IMPLANT
SUT MNCRL AB 3-0 PS2 18 (SUTURE) ×1 IMPLANT
SUT VIC AB 3-0 SH 8-18 (SUTURE) ×1 IMPLANT
TAPE CLOTH 3X10 TAN LF (GAUZE/BANDAGES/DRESSINGS) ×1 IMPLANT
TOWEL GREEN STERILE (TOWEL DISPOSABLE) ×1 IMPLANT
TOWEL GREEN STERILE FF (TOWEL DISPOSABLE) ×1 IMPLANT
WATER STERILE IRR 1000ML POUR (IV SOLUTION) ×1 IMPLANT

## 2022-09-04 NOTE — Discharge Summary (Signed)
Discharge Summary  Date of Admission: 09/04/2022  Date of Discharge: 09/04/22  Attending Physician: Emelda Brothers, MD  Hospital Course: Patient was admitted following an uncomplicated [id number] ACDF. They were recovered in PACU and transferred to [facility]. Their preop symptoms were improved, their hospital course was uncomplicated and the patient was discharged home today. They will follow up in clinic with me in clinic in 2 weeks.  Discharge diagnosis: cervical myelopathy  Collene Schlichter, PA-C 09/04/22 4:13 PM

## 2022-09-04 NOTE — Transfer of Care (Signed)
Immediate Anesthesia Transfer of Care Note  Patient: Guy Morris  Procedure(s) Performed: Cervical Three- Four  Anterior Cervical Decompression Fusion  Patient Location: PACU  Anesthesia Type:General  Level of Consciousness: awake, drowsy, and patient cooperative  Airway & Oxygen Therapy: Patient Spontanous Breathing and Patient connected to nasal cannula oxygen  Post-op Assessment: Report given to RN, Post -op Vital signs reviewed and stable, and Patient moving all extremities X 4  Post vital signs: stable  Last Vitals:  Vitals Value Taken Time  BP 106/89 09/04/22 1030  Temp    Pulse 67 09/04/22 1032  Resp 15 09/04/22 1032  SpO2 93 % 09/04/22 1032  Vitals shown include unvalidated device data.  Last Pain:  Vitals:   09/04/22 0618  PainSc: 0-No pain      Patients Stated Pain Goal: 0 ([id number])  Complications: No notable events documented.

## 2022-09-04 NOTE — Op Note (Signed)
PATIENT: Guy Morris   PROCEDURE DATE: 09/04/22   PRE-OPERATIVE DIAGNOSIS:  Cervical myelopathy   POST-OPERATIVE DIAGNOSIS:  Same   PROCEDURE:  C3-C4 Anterior Cervical Discectomy and Instrumented Fusion   SURGEON:  Surgeon(s) and Role:    Judith Part, MD    Norm Parcel PA    ANESTHESIA: ETGA   BRIEF HISTORY: This is a 46 who presented with signs and symptoms of cervical myelopathy. Workup showed cervical stenosis with cord signal change at C3-4. I therefore recommended ACDF at that level. This was discussed with the patient as well as risks, benefits, and alternatives and the patient wished to proceed with surgical treatment.   OPERATIVE DETAIL: The patient was taken to the operating room and placed on the OR table in the supine position. A formal time out was performed with two patient identifiers and confirmed the operative site. Anesthesia was induced by the anesthesia team.  Fluoroscopy was used to localize the surgical level and an incision was marked in a skin crease. The area was then prepped and draped in a sterile fashion. A transverse linear incision was made on the right side of the neck. The platysma was divided and the sternocleidomastoid muscle was identified. The carotid sheath was palpated, identified, and retracted laterally with the sternocleidomastoid muscle. The strap muscles were identified and retracted medially and the pretracheal fascia was entered. A bent spinal needle was used with fluoroscopy to localize the surgical level after dissection. The longus colli were elevated bilaterally and a self-retaining retractor was placed. The endotracheal tube cuff balloon was deflated and reinflated after retractor placement.    Anterior osteophytes were removed until flush with the anterior vertebral body. The disc annulus was incised and a complete C3-C4 discectomy was performed. The posterior longitudinal ligament was incised followed by ligamentous and bony  removal until no central canal stenosis was present. The disc-osteophyte complex was quite calcified with a large central calcified disc herniation. This was carefully dissected circumferentially then removed with a nerve hook.  A 82mm cortical allograft (Medtronic) was inserted into the disc space as an interbody graft. An anterior plate (Medtronic) was positioned and 4, 71mm screws were used to secure the plate to the C3 and C4 vertebral bodies. Hemostasis was obtained and the incision was closed in layers. All instrument and sponge counts were correct. The patient was then returned to anesthesia for emergence. No apparent complications at the completion of the procedure.   EBL:  27mL   DRAINS: none   SPECIMENS: none   Judith Part, MD 09/04/22 7:53 AM

## 2022-09-04 NOTE — Plan of Care (Signed)
Pt and wife given D/C instructions with verbal understanding. Rx's were sent to the pharmacy by MD. Pt's incision is is clean and dry with no sign of infection. Pt's IV was removed prior to D/C. Pt D/C'd home via wheelchair per MD order. Pt is stable @ D/C and has no other needs at this time. Holli Humbles, RN

## 2022-09-04 NOTE — Progress Notes (Deleted)
PATIENT: Guy Morris  PROCEDURE DATE: 09/04/22  PRE-OPERATIVE DIAGNOSIS:  Cervical myelopathy   POST-OPERATIVE DIAGNOSIS:  Same   PROCEDURE:  C3-C4 Anterior Cervical Discectomy and Instrumented Fusion   SURGEON:  Surgeon(s) and Role:    Judith Part, MD    Norm Parcel PA    ANESTHESIA: ETGA   BRIEF HISTORY: This is a 68 who presented with signs and symptoms of cervical myelopathy. Workup showed cervical stenosis with cord signal change at C3-4. I therefore recommended ACDF at that level. This was discussed with the patient as well as risks, benefits, and alternatives and the patient wished to proceed with surgical treatment.   OPERATIVE DETAIL: The patient was taken to the operating room and placed on the OR table in the supine position. A formal time out was performed with two patient identifiers and confirmed the operative site. Anesthesia was induced by the anesthesia team.  Fluoroscopy was used to localize the surgical level and an incision was marked in a skin crease. The area was then prepped and draped in a sterile fashion. A transverse linear incision was made on the right side of the neck. The platysma was divided and the sternocleidomastoid muscle was identified. The carotid sheath was palpated, identified, and retracted laterally with the sternocleidomastoid muscle. The strap muscles were identified and retracted medially and the pretracheal fascia was entered. A bent spinal needle was used with fluoroscopy to localize the surgical level after dissection. The longus colli were elevated bilaterally and a self-retaining retractor was placed. The endotracheal tube cuff balloon was deflated and reinflated after retractor placement.   Anterior osteophytes were removed until flush with the anterior vertebral body. The disc annulus was incised and a complete C3-C4 discectomy was performed. The posterior longitudinal ligament was incised followed by ligamentous and bony  removal until no central canal stenosis was present. The disc-osteophyte complex was quite calcified with a large central calcified disc herniation. This was carefully dissected circumferentially then removed with a nerve hook.  A 19mm cortical allograft (Medtronic) was inserted into the disc space as an interbody graft. An anterior plate (Medtronic) was positioned and 4, 25mm screws were used to secure the plate to the C3 and C4 vertebral bodies. Hemostasis was obtained and the incision was closed in layers. All instrument and sponge counts were correct. The patient was then returned to anesthesia for emergence. No apparent complications at the completion of the procedure.   EBL:  56mL   DRAINS: none   SPECIMENS: none   Judith Part, MD 09/04/22 7:53 AM

## 2022-09-04 NOTE — Evaluation (Signed)
Occupational Therapy Evaluation Patient Details Name: Guy Morris MRN: [id number] DOB: May 24, 1951 Today's Date: 09/04/2022   History of Present Illness 72 yo M adm 3/26 for scheduled ACDF.  PMH includes TKA, GERD, BPH.   Clinical Impression   Patient admitted for the procedure above.  PTA he lives with his spouse, who is able to assist if needed.  He needed no assist with ADL, iADL or mobility.  Patient stays very active, and should progress quickly.  Patient expresses an appropriate amount of post op discomfort, but is essentially at his baseline for mobility and ADL completion.  Patient demonstrates a good understanding of all cervical precautions, and all questions answered.  No further OT is indicated in the acute setting, and no PT needs exist.  Patient prefers to use a RW currently, as it makes him feel steadier, but he should progress quickly back to his baseline of no AD.        Recommendations for follow up therapy are one component of a multi-disciplinary discharge planning process, led by the attending physician.  Recommendations may be updated based on patient status, additional functional criteria and insurance authorization.   Assistance Recommended at Discharge Set up Supervision/Assistance  Patient can return home with the following Assist for transportation    Functional Status Assessment  Patient has not had a recent decline in their functional status  Equipment Recommendations  None recommended by OT    Recommendations for Other Services       Precautions / Restrictions Precautions Precautions: Cervical Precaution Booklet Issued: Yes (comment) Precaution Comments: demonstrates understanding Restrictions Weight Bearing Restrictions: No      Mobility Bed Mobility Overal bed mobility: Modified Independent                  Transfers Overall transfer level: Modified independent Equipment used: Rolling walker (2 wheels)                       Balance Overall balance assessment: Mild deficits observed, not formally tested                                         ADL either performed or assessed with clinical judgement   ADL Overall ADL's : At baseline                                       General ADL Comments: generalized supervision     Vision Patient Visual Report: No change from baseline       Perception     Praxis      Pertinent Vitals/Pain Pain Assessment Pain Assessment: Faces Faces Pain Scale: Hurts little more Pain Location: Incisional Pain Descriptors / Indicators: Tender Pain Intervention(s): Monitored during session     Hand Dominance Right   Extremity/Trunk Assessment Upper Extremity Assessment Upper Extremity Assessment: Overall WFL for tasks assessed   Lower Extremity Assessment Lower Extremity Assessment: Overall WFL for tasks assessed;RLE deficits/detail;LLE deficits/detail RLE Deficits / Details: prior R TKA LLE Deficits / Details: Arthritic knee   Cervical / Trunk Assessment Cervical / Trunk Assessment: Neck Surgery   Communication Communication Communication: No difficulties   Cognition Arousal/Alertness: Awake/alert Behavior During Therapy: WFL for tasks assessed/performed Overall Cognitive Status: Within Functional Limits for tasks assessed  General Comments   VSS on RA    Exercises     Shoulder Instructions      Home Living Family/patient expects to be discharged to:: Private residence Living Arrangements: Spouse/significant other Available Help at Discharge: Family;Available 24 hours/day Type of Home: House Home Access: Stairs to enter [employer] of Steps: 1 Entrance Stairs-Rails: None Home Layout: One level     Bathroom Shower/Tub: Teacher, early years/pre: Standard Bathroom Accessibility: Yes How Accessible: Accessible via walker Home Equipment:  [place] (2 wheels);Shower seat;Cane - single point          Prior Functioning/Environment Prior Level of Function : Independent/Modified Independent;Driving                        OT Problem List: Pain      OT Treatment/Interventions:      OT Goals(Current goals can be found in the care plan section) Acute Rehab OT Goals Patient Stated Goal: Return home when MD says it's okay OT Goal Formulation: With patient Time For Goal Achievement: 09/07/22 Potential to Achieve Goals: Good  OT Frequency:      Co-evaluation              AM-PAC OT "6 Clicks" Daily Activity     Outcome Measure Help from another person eating meals?: None Help from another person taking care of personal grooming?: None Help from another person toileting, which includes using toliet, bedpan, or urinal?: None Help from another person bathing (including washing, rinsing, drying)?: None Help from another person to put on and taking off regular upper body clothing?: None Help from another person to put on and taking off regular lower body clothing?: None 6 Click Score: 24   End of Session Equipment Utilized During Treatment: Gait belt;Rolling walker (2 wheels) Nurse Communication: Mobility status  Activity Tolerance: Patient tolerated treatment well Patient left: in bed;with call bell/phone within reach;with family/visitor present  OT Visit Diagnosis: Pain Pain - Right/Left:  (Cervical)                Time: [id number] OT Time Calculation (min): 28 min Charges:  OT General Charges $OT Visit: 1 Visit OT Evaluation $OT Eval Moderate Complexity: 1 Mod OT Treatments $Self Care/Home Management : 8-22 mins  09/04/2022  RP, OTR/L  Acute Rehabilitation Services  Office:  [phone]   Metta Clines 09/04/2022, 2:35 PM

## 2022-09-04 NOTE — Anesthesia Postprocedure Evaluation (Signed)
Anesthesia Post Note  Patient: Guy Morris  Procedure(s) Performed: Cervical Three- Four  Anterior Cervical Decompression Fusion     Patient location during evaluation: PACU Anesthesia Type: General Level of consciousness: awake Pain management: pain level controlled Vital Signs Assessment: post-procedure vital signs reviewed and stable Respiratory status: spontaneous breathing, nonlabored ventilation and respiratory function stable Cardiovascular status: blood pressure returned to baseline and stable Postop Assessment: no apparent nausea or vomiting Anesthetic complications: no   No notable events documented.  Last Vitals:  Vitals:   09/04/22 1130 09/04/22 1158  BP: 119/75 130/83  Pulse: (!) 59 67  Resp: 14 18  Temp: 36.6 C   SpO2: 100% 94%    Last Pain:  Vitals:   09/04/22 1200  PainSc: 2                  Nilda Simmer

## 2022-09-04 NOTE — H&P (Signed)
Surgical H&P Update  HPI: 72 y.o. with a history of cervical myelopathy. Workup showed cervical stenosis with cord signal change. No changes in health since they were last seen. Still having the above and wishes to proceed with surgery.  PMHx:  Past Medical History:  Diagnosis Date   ABNORMAL TRANSAMINASE-LFT'S 08/04/2008   Qualifier: Diagnosis of  By: Fuller Plan MD Lamont Snowball T    ALLERGIC RHINITIS 05/06/2007   Qualifier: Diagnosis of  By: Jenny Reichmann MD, Hunt Oris    Allergy    Anginal pain ([place])    Intermittent. Takes Nitro PRN   Atrial fibrillation ([facility])    BPH (benign prostatic hyperplasia) 04/28/2012   COPD (chronic obstructive pulmonary disease) ([place])    Coronary artery disease with history of myocardial infarction without history of CABG 04/11/2021   Degenerative joint disease 04/23/2011   Dysrhythmia    Hx of A.Fib   ERECTILE DYSFUNCTION 05/06/2007   Qualifier: Diagnosis of  By: Jenny Reichmann MD, Hunt Oris    GERD 05/06/2007   Qualifier: Diagnosis of  By: Jenny Reichmann MD, Hunt Oris    HYPERLIPIDEMIA 05/06/2007   Qualifier: Diagnosis of  By: Jenny Reichmann MD, Hunt Oris    Hypertension    HYPOTHYROIDISM 05/06/2007   Qualifier: Diagnosis of  By: Jenny Reichmann MD, Hunt Oris    Insomnia 04/28/2012   Myocardial infarction ([place])    OSTEOARTHRITIS, KNEE, 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   Qualifier: Diagnosis of  By: Jenny Reichmann MD, Hunt Oris    Peripheral vascular disease [facility])    Dr. Hessie Dibble   Personal history of colonic polyps 07/20/2008   Centricity Description: PERSONAL HX COLONIC POLYPS Qualifier: Diagnosis of  By: Ardis Hughs MD, Melene Plan  Centricity Description: COLONIC POLYPS, HX OF Qualifier: Diagnosis of  By: Jenny Reichmann MD, Hunt Oris    Pneumonia    Hx   RLS (restless legs syndrome) 10/17/2010   Urge incontinence 04/28/2012   [facility]are heps per urology   FamHx:  Family History  Problem Relation Age of Onset   Cancer Mother        Breast Cancer   Arthritis Mother        RA   Cancer Father        Lung Cancer   Esophageal cancer Father     Pancreatic cancer Maternal Uncle    Stomach cancer Sister    Colon cancer Neg Hx    Colon polyps Neg Hx    Rectal cancer Neg Hx    SocHx:  reports that he quit smoking about 22 years ago. His smoking use included cigarettes. He has a 74.00 pack-year smoking history. He has never used smokeless tobacco. He reports that he does not drink alcohol and does not use drugs.  Physical Exam: Strength 5/5 x4 and SILTx4, +hoffman's b/l, no clonus  Assesment/Plan: 72 y.o. man with cervical myelopathy, here for C3-4 ACDF. Risks, benefits, and alternatives discussed and the patient would like to continue with surgery.  -OR today -3C post-op  Judith Part, MD 09/04/22 7:46 AM

## 2022-09-04 NOTE — Anesthesia Procedure Notes (Signed)
Procedure Name: Intubation Date/Time: 09/04/2022 8:00 AM  Performed by: Anastasio Auerbach, CRNAPre-anesthesia Checklist: Patient identified, Emergency Drugs available, Suction available and Patient being monitored Patient Re-evaluated:Patient Re-evaluated prior to induction Oxygen Delivery Method: Circle system utilized Preoxygenation: Pre-oxygenation with 100% oxygen Induction Type: IV induction Ventilation: Mask ventilation without difficulty Laryngoscope Size: Glidescope and 3 Grade View: Grade I Tube type: Oral Tube size: 7.5 mm Number of attempts: 1 Airway Equipment and Method: Stylet and Oral airway Placement Confirmation: ETT inserted through vocal cords under direct vision, positive ETCO2 and breath sounds checked- equal and bilateral Secured at: 22 cm Tube secured with: Tape Dental Injury: Teeth and Oropharynx as per pre-operative assessment

## 2022-09-04 NOTE — Progress Notes (Signed)
Neurosurgery Service Progress Note  Subjective: He is feeling well and would like to discharge home today.   Objective: Vitals:   09/04/22 1100 09/04/22 1115 09/04/22 1130 09/04/22 1158  BP: 122/72 113/73 119/75 130/83  Pulse: 66 65 (!) 59 67  Resp: 18 13 14 18   Temp:   97.8 F (36.6 C)   SpO2: 98% 100% 100% 94%  Weight:      Height:        Assessment & Plan: 72 y.o. male s/p C3-4 ACDF, recovering well.  -discharge home today   Norm Parcel, PA-C 09/04/22 4:14 PM

## 2022-09-04 NOTE — Progress Notes (Signed)
PT Cancellation Note  Patient Details Name: CARVELL LAMBERG MRN: [id number] DOB: 1950-07-26   Cancelled Treatment:    Reason Eval/Treat Not Completed: PT screened, no needs identified, will sign off (OT evaluated; pt ambulating modI)  Wyona Almas, PT, DPT [facility] 09/04/2022, 2:32 PM

## 2022-09-05 ENCOUNTER — Encounter (HOSPITAL_COMMUNITY): Payer: Self-pay | Admitting: Neurological Surgery

## 2022-10-01 ENCOUNTER — Other Ambulatory Visit: Payer: Self-pay | Admitting: Internal Medicine

## 2022-10-01 ENCOUNTER — Encounter: Payer: Self-pay | Admitting: Internal Medicine

## 2022-10-01 ENCOUNTER — Ambulatory Visit (INDEPENDENT_AMBULATORY_CARE_PROVIDER_SITE_OTHER): Payer: Medicare Other | Admitting: Internal Medicine

## 2022-10-01 VITALS — BP 130/76 | HR 64 | Temp 97.6°F | Ht 68.0 in | Wt 173.0 lb

## 2022-10-01 DIAGNOSIS — E039 Hypothyroidism, unspecified: Secondary | ICD-10-CM

## 2022-10-01 DIAGNOSIS — E538 Deficiency of other specified B group vitamins: Secondary | ICD-10-CM

## 2022-10-01 DIAGNOSIS — N320 Bladder-neck obstruction: Secondary | ICD-10-CM

## 2022-10-01 DIAGNOSIS — J309 Allergic rhinitis, unspecified: Secondary | ICD-10-CM

## 2022-10-01 DIAGNOSIS — E7800 Pure hypercholesterolemia, unspecified: Secondary | ICD-10-CM | POA: Diagnosis not present

## 2022-10-01 DIAGNOSIS — E559 Vitamin D deficiency, unspecified: Secondary | ICD-10-CM

## 2022-10-01 DIAGNOSIS — G894 Chronic pain syndrome: Secondary | ICD-10-CM

## 2022-10-01 DIAGNOSIS — R739 Hyperglycemia, unspecified: Secondary | ICD-10-CM

## 2022-10-01 LAB — CBC WITH DIFFERENTIAL/PLATELET
Basophils Absolute: 0 10*3/uL (ref 0.0–0.1)
Basophils Relative: 0.6 % (ref 0.0–3.0)
Eosinophils Absolute: 0.2 10*3/uL (ref 0.0–0.7)
Eosinophils Relative: 2.6 % (ref 0.0–5.0)
HCT: 47.3 % (ref 39.0–52.0)
Hemoglobin: 15.8 g/dL (ref 13.0–17.0)
Lymphocytes Relative: 30.1 % (ref 12.0–46.0)
Lymphs Abs: 2 10*3/uL (ref 0.7–4.0)
MCHC: 33.3 g/dL (ref 30.0–36.0)
MCV: 93.9 fl (ref 78.0–100.0)
Monocytes Absolute: 0.8 10*3/uL (ref 0.1–1.0)
Monocytes Relative: 11.2 % (ref 3.0–12.0)
Neutro Abs: 3.8 10*3/uL (ref 1.4–7.7)
Neutrophils Relative %: 55.5 % (ref 43.0–77.0)
Platelets: 302 10*3/uL (ref 150.0–400.0)
RBC: 5.04 Mil/uL (ref 4.22–5.81)
RDW: 13.2 % (ref 11.5–15.5)
WBC: 6.8 10*3/uL (ref 4.0–10.5)

## 2022-10-01 LAB — PSA: PSA: 1.27 ng/mL (ref 0.10–4.00)

## 2022-10-01 LAB — URINALYSIS, ROUTINE W REFLEX MICROSCOPIC
Bilirubin Urine: NEGATIVE
Hgb urine dipstick: NEGATIVE
Ketones, ur: NEGATIVE
Leukocytes,Ua: NEGATIVE
Nitrite: NEGATIVE
RBC / HPF: NONE SEEN (ref 0–?)
Specific Gravity, Urine: <=1.005 — AB (ref 1.000–1.030)
Total Protein, Urine: NEGATIVE
Urine Glucose: NEGATIVE
Urobilinogen, UA: 0.2 (ref 0.0–1.0)
WBC, UA: NONE SEEN (ref 0–?)
pH: 6 (ref 5.0–8.0)

## 2022-10-01 LAB — BASIC METABOLIC PANEL
BUN: 13 mg/dL (ref 6–23)
CO2: 27 mEq/L (ref 19–32)
Calcium: 10 mg/dL (ref 8.4–10.5)
Chloride: 102 mEq/L (ref 96–112)
Creatinine, Ser: 0.81 mg/dL (ref 0.40–1.50)
GFR: 89.02 mL/min (ref >60.00)
Glucose, Bld: 99 mg/dL (ref 70–99)
Potassium: 4.5 mEq/L (ref 3.5–5.1)
Sodium: 136 mEq/L (ref 135–145)

## 2022-10-01 LAB — LIPID PANEL
Cholesterol: 221 mg/dL — ABNORMAL HIGH (ref 0–200)
HDL: 48.8 mg/dL (ref >39.00)
LDL Cholesterol: 133 mg/dL — ABNORMAL HIGH (ref 0–99)
NonHDL: 172.56
Total CHOL/HDL Ratio: 5
Triglycerides: 197 mg/dL — ABNORMAL HIGH (ref 0.0–149.0)
VLDL: 39.4 mg/dL (ref 0.0–40.0)

## 2022-10-01 LAB — HEPATIC FUNCTION PANEL
ALT: 12 U/L (ref 0–53)
AST: 16 U/L (ref 0–37)
Albumin: 4.5 g/dL (ref 3.5–5.2)
Alkaline Phosphatase: 85 U/L (ref 39–117)
Bilirubin, Direct: 0.1 mg/dL (ref 0.0–0.3)
Total Bilirubin: 0.5 mg/dL (ref 0.2–1.2)
Total Protein: 6.8 g/dL (ref 6.0–8.3)

## 2022-10-01 LAB — T4, FREE: Free T4: 1.57 ng/dL (ref 0.60–1.60)

## 2022-10-01 LAB — HEMOGLOBIN A1C: Hgb A1c MFr Bld: 6.2 % (ref 4.6–6.5)

## 2022-10-01 LAB — VITAMIN D 25 HYDROXY (VIT D DEFICIENCY, FRACTURES): VITD: 34.67 ng/mL (ref 30.00–100.00)

## 2022-10-01 LAB — VITAMIN B12: Vitamin B-12: 1059 pg/mL — ABNORMAL HIGH (ref 211–911)

## 2022-10-01 LAB — TSH: TSH: 0.51 u[IU]/mL (ref 0.35–5.50)

## 2022-10-01 MED ORDER — PREDNISONE 10 MG PO TABS: ORAL_TABLET | ORAL | 0 refills | Status: DC

## 2022-10-01 MED ORDER — EZETIMIBE 10 MG PO TABS
10.0000 mg | ORAL_TABLET | Freq: Every day | ORAL | 3 refills | Status: DC
Start: 2022-10-01 — End: 2022-10-11

## 2022-10-01 MED ORDER — CYCLOBENZAPRINE HCL 10 MG PO TABS: 10.0000 mg | ORAL_TABLET | Freq: Three times a day (TID) | ORAL | 2 refills | Status: DC | PRN

## 2022-10-01 NOTE — Patient Instructions (Addendum)
Ok to try the OTC Voltaren gel topical and/or the salonpaz patch for neck pain  Please take all new medication as prescribed - the prednisone for the allergies  Please continue all other medications as before, and refills have been done as requested for the muscle relaxer  Please have the pharmacy call with any other refills you may need.  Please continue your efforts at being more active, low cholesterol diet, and weight control.  Please keep your appointments with your specialists as you may have planned - Dr Maurice Small for the spine  Please go to the LAB at the blood drawing area for the tests to be done  You will be contacted by phone if any changes need to be made immediately.  Otherwise, you will receive a letter about your results with an explanation, but please check with MyChart first.  Please remember to sign up for MyChart if you have not done so, as this will be important to you in the future with finding out test results, communicating by private email, and scheduling acute appointments online when needed.  Please make an Appointment to return in 6 months, or sooner if needed
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
## 2022-10-02 ENCOUNTER — Encounter: Payer: Self-pay | Admitting: Internal Medicine

## 2022-10-02 DIAGNOSIS — J309 Allergic rhinitis, unspecified: Secondary | ICD-10-CM | POA: Insufficient documentation

## 2022-10-02 NOTE — Assessment & Plan Note (Signed)
Asympt, ok for psa with labs

## 2022-10-02 NOTE — Assessment & Plan Note (Addendum)
With persistent neck pain, ok for flexeril 10 tid  prn

## 2022-10-02 NOTE — Assessment & Plan Note (Signed)
Mild to mod, with seasonal flare, for prednisone taper,  to f/u any worsening symptoms or concerns

## 2022-10-02 NOTE — Assessment & Plan Note (Signed)
Lab Results  Component Value Date   HGBA1C 6.2 10/01/2022   Stable, pt to continue current medical treatment  - diet, wt control

## 2022-10-02 NOTE — Assessment & Plan Note (Signed)
Last vitamin D Lab Results  Component Value Date   VD25OH 34.67 10/01/2022   Low, to start oral replacement

## 2022-10-02 NOTE — Assessment & Plan Note (Signed)
Lab Results  Component Value Date   TSH 0.51 10/01/2022   Stable, pt to continue levothyroxine 2000 mcg qd

## 2022-10-02 NOTE — Assessment & Plan Note (Addendum)
Lab Results  Component Value Date   LDLCALC 133 (H) 10/01/2022   Uncontrolled, pt for lower chol diet, declines statin as has been intolerant, ok for zetia 10 mg qd

## 2022-10-02 NOTE — Assessment & Plan Note (Signed)
Lab Results  Component Value Date   VITAMINB12 1,059 (H) 10/01/2022   Stable, cont oral replacement - b12 1000 mcg qd

## 2022-10-11 ENCOUNTER — Other Ambulatory Visit (HOSPITAL_COMMUNITY): Payer: Self-pay

## 2022-10-11 ENCOUNTER — Encounter (HOSPITAL_BASED_OUTPATIENT_CLINIC_OR_DEPARTMENT_OTHER): Payer: Self-pay | Admitting: Pulmonary Disease

## 2022-10-11 ENCOUNTER — Other Ambulatory Visit: Payer: Self-pay | Admitting: *Deleted

## 2022-10-11 ENCOUNTER — Ambulatory Visit (INDEPENDENT_AMBULATORY_CARE_PROVIDER_SITE_OTHER): Payer: Medicare Other | Admitting: Pulmonary Disease

## 2022-10-11 VITALS — BP 138/80 | HR 50 | Temp 97.6°F | Ht 68.0 in | Wt 173.4 lb

## 2022-10-11 DIAGNOSIS — J449 Chronic obstructive pulmonary disease, unspecified: Secondary | ICD-10-CM | POA: Diagnosis not present

## 2022-10-11 MED ORDER — TRELEGY ELLIPTA 200-62.5-25 MCG/ACT IN AEPB: 1.0000 | INHALATION_SPRAY | Freq: Every day | RESPIRATORY_TRACT | 0 refills | Status: DC

## 2022-10-11 NOTE — Progress Notes (Signed)
Subjective:   PATIENT ID: [name] GENDER: male DOB: [birth year], MRN: [id number]  Chief Complaint  Patient presents with   Follow-up    Follow up. Patient has no complaints.     Reason for Visit: Follow-up  [name] is a 72 year old male former smoker with COPD, atrial fibrillation s/p ablation in 2021/22, HLD, GERD, BPH, hypothyroidism who presents for follow-up.  Initial consult He reports recent respiratory illness this has improved. He has had gradual shortness of breath in the last year. Wants to be active. Difficulty running errands. Walking shortness distances including going to doctor offices will tire him. Able to grocery shop if he stops and takes frequently breaks. Denies chronic coughing or wheezing. Had covid in 10/2019 and 06/2021.   08/02/22 He continues to have shortness of breath with exertion. However he now walks 8000 steps daily. Exercise does seem to be helping his endurance. Has not been able to pick up Anoro due to cost. Denies cough or wheezing. No exacerbations since our last visit requiring steroids or antibiotics.  10/11/22 Since our last visit he had ACDF of C3-4 in March. Reports symptoms are unchanged. Breathing is rough at times and triggered by pollen and during yardwork. Associated with chest tightness. Denies cough or wheezing. Has been using Wixela once a day. He enjoys being outdoors and taking care of his yard  and his breathing sometimes limits him. Waiting until he can get insurance for 2025 for more inhaler options.  Social History: Former smoker. Quit 8 years ago in 2016. 74 pack-years Previously worked as an Personnel officer x 30 years. Located at [employer], suspected asbestos exposure   Past Medical History:  Diagnosis Date   ABNORMAL TRANSAMINASE-LFT'S 08/04/2008   Qualifier: Diagnosis of  By: Russella Dar MD Bronson Curb T    ALLERGIC RHINITIS 05/06/2007   Qualifier: Diagnosis of  By: Jonny Ruiz MD, Len Blalock    Allergy    Anginal pain  (HCC)    Intermittent. Takes Nitro PRN   Atrial fibrillation (HCC)    BPH (benign prostatic hyperplasia) 04/28/2012   COPD (chronic obstructive pulmonary disease) (HCC)    Coronary artery disease with history of myocardial infarction without history of CABG 04/11/2021   Degenerative joint disease 04/23/2011   Dysrhythmia    Hx of A.Fib   ERECTILE DYSFUNCTION 05/06/2007   Qualifier: Diagnosis of  By: Jonny Ruiz MD, Len Blalock    GERD 05/06/2007   Qualifier: Diagnosis of  By: Jonny Ruiz MD, Len Blalock    HYPERLIPIDEMIA 05/06/2007   Qualifier: Diagnosis of  By: Jonny Ruiz MD, Len Blalock    Hypertension    HYPOTHYROIDISM 05/06/2007   Qualifier: Diagnosis of  By: Jonny Ruiz MD, Len Blalock    Insomnia 04/28/2012   Myocardial infarction (HCC)    OSTEOARTHRITIS, KNEE, 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   Qualifier: Diagnosis of  By: Jonny Ruiz MD, Len Blalock    Peripheral vascular disease [facility])    Dr. Gerhard Munch   Personal history of colonic polyps 07/20/2008   Centricity Description: PERSONAL HX COLONIC POLYPS Qualifier: Diagnosis of  By: Christella Hartigan MD, Melton Alar  Centricity Description: COLONIC POLYPS, HX OF Qualifier: Diagnosis of  By: Jonny Ruiz MD, Len Blalock    Pneumonia    Hx   RLS (restless legs syndrome) 10/17/2010   Urge incontinence 04/28/2012   vesicare heps per urology     Family History  Problem Relation Age of Onset   Cancer Mother        Breast Cancer  Arthritis Mother        RA   Cancer Father        Lung Cancer   Esophageal cancer Father    Pancreatic cancer Maternal Uncle    Stomach cancer Sister    Colon cancer Neg Hx    Colon polyps Neg Hx    Rectal cancer Neg Hx      Social History   Occupational History   Occupation: disabled - DJD knees, neck and shoulders - on SSI  Tobacco Use   Smoking status: Former    Packs/day: 2.00    Years: 37.00    Additional pack years: 0.00    Total pack years: 74.00    Types: Cigarettes    Quit date: 06/11/2000    Years since quitting: 22.3   Smokeless tobacco: Never   Tobacco  comments:    quit smoking cigarettes 2002 and quit cigars 2016  Vaping Use   Vaping Use: Never used  Substance and Sexual Activity   Alcohol use: No   Drug use: No   Sexual activity: Not Currently    Allergies  Allergen Reactions   Atorvastatin     myalgia   Claritin [Loratadine]     dizzy   Rofecoxib Itching   Rosuvastatin     myalgia   Zocor [Simvastatin] Other (See Comments)    myalgia     Outpatient Medications Prior to Visit  Medication Sig Dispense Refill   ascorbic acid (VITAMIN C) 1000 MG tablet Take 1,000 mg by mouth daily.     aspirin EC 81 MG tablet Take 1 tablet (81 mg total) by mouth daily. Swallow whole. 30 tablet 11   Ca Carbonate-Mag Hydroxide (ROLAIDS PO) Take 1 tablet by mouth 3 (three) times daily as needed (heartburn).     cholecalciferol (VITAMIN D3) 25 MCG (1000 UNIT) tablet Take 1,000 Units by mouth daily.     cyclobenzaprine (FLEXERIL) 10 MG tablet Take 1 tablet (10 mg total) by mouth 3 (three) times daily as needed for muscle spasms. 30 tablet 2   Fluticasone-Umeclidin-Vilant 200-62.5-25 MCG/ACT AEPB Inhale 1 puff into the lungs daily. 28 each 0   lansoprazole (PREVACID) 15 MG capsule Take 15 mg by mouth daily at 12 noon.     levothyroxine (SYNTHROID) 200 MCG tablet Take 1 tablet (200 mcg total) by mouth daily. 90 tablet 3   meclizine (ANTIVERT) 12.5 MG tablet TAKE 1 TABLET BY MOUTH THREE TIMES DAILY AS NEEDED FOR DIZZINESS 30 tablet 0   metoprolol tartrate (LOPRESSOR) 50 MG tablet Take 1/2 tablet ( 25 mg ) twice a day (Patient taking differently: Take 50 mg by mouth daily.) 90 tablet 3   oxyCODONE (OXY IR/ROXICODONE) 5 MG immediate release tablet Take 1 tablet (5 mg total) by mouth every 4 (four) hours as needed for moderate pain ((score 4 to 6)). 30 tablet 0   polyethylene glycol (MIRALAX / GLYCOLAX) 17 g packet Take 17 g by mouth daily. 14 each 0   predniSONE (DELTASONE) 10 MG tablet 3 tabs by mouth per day for 3 days,2tabs per day for 3 days,1tab per  day for 3 days 18 tablet 0   Turmeric Curcumin 500 MG CAPS Take 1,000 mg by mouth daily.     vitamin B-12 (CYANOCOBALAMIN) 1000 MCG tablet Take 1 tablet (1,000 mcg total) by mouth daily. 90 tablet 1   vitamin B-12 (CYANOCOBALAMIN) 250 MCG tablet Take 250 mcg by mouth daily.     Cholecalciferol (THERA-D 2000) 50 MCG (2000 UT)  TABS 1 tab by mouth once daily (Patient not taking: Reported on 08/23/2022) 30 tablet 99   citalopram (CELEXA) 10 MG tablet Take 1 tablet (10 mg total) by mouth daily. 90 tablet 3   fluticasone-salmeterol (WIXELA INHUB) 250-50 MCG/ACT AEPB Inhale 1 puff into the lungs in the morning and at bedtime. (Patient not taking: Reported on 08/23/2022) 60 each 5   nitroGLYCERIN (NITROSTAT) 0.4 MG SL tablet Place 1 tablet (0.4 mg total) under the tongue every 5 (five) minutes as needed for chest pain. 90 tablet 3   ezetimibe (ZETIA) 10 MG tablet Take 1 tablet (10 mg total) by mouth daily. 90 tablet 3   gabapentin (NEURONTIN) 300 MG capsule Take 300 mg by mouth 2 (two) times daily.     No facility-administered medications prior to visit.    Review of Systems  Constitutional:  Negative for chills, diaphoresis, fever, malaise/fatigue and weight loss.  HENT:  Negative for congestion.   Respiratory:  Positive for shortness of breath. Negative for cough, hemoptysis, sputum production and wheezing.   Cardiovascular:  Negative for chest pain, palpitations and leg swelling.     Objective:   Vitals:   10/11/22 1025  BP: 138/80  Pulse: (!) 50  Temp: 97.6 F (36.4 C)  TempSrc: Oral  SpO2: 97%  Weight: 173 lb 6.4 oz (78.7 kg)  Height: 5\' 8"  (1.727 m)  SpO2: 97 % O2 Device: None (Room air)  Physical Exam: General: Well-appearing, no acute distress HENT: [place], AT Eyes: EOMI, no scleral icterus Respiratory: Diminished breath sounds clear to auscultation bilaterally.  No crackles, wheezing or rales Cardiovascular: RRR, -M/R/G, no JVD Extremities:-Edema,-tenderness Neuro: AAO x4,  CNII-XII grossly intact Psych: Normal mood, normal affect  Data Reviewed:  Imaging: CT A/P 06/29/21 - Lower lobes with normal parenchyma CXR 05/22/22 - Right lateral chest with increased nodularity. Pleural plaques. Subtle nodularity in left base CT Chest 07/17/22 - Bilateral pleural plaques, calcified. Emphysema, mild  PFT: 04/25/20 FVC 4.09 (98%) FEV1 2.71 (88%) Ratio 66  Interpretation: Mild obstructive defect based on GOLD's criteria  07/02/22 FVC 4.01 (102%) FEV1 2.51 (866%) Ratio 62  TLC 117% RV 168% DLCO 93% Interpretation: Mild obstructive defect with air trapping   Labs: CBC    Component Value Date/Time   WBC 6.8 10/01/2022 1142   RBC 5.04 10/01/2022 1142   HGB 15.8 10/01/2022 1142   HGB 15.9 04/21/2020 1142   HCT 47.3 10/01/2022 1142   HCT 46.4 04/21/2020 1142   PLT 302.0 10/01/2022 1142   PLT 255 04/21/2020 1142   MCV 93.9 10/01/2022 1142   MCV 92 04/21/2020 1142   MCH 31.4 08/29/2022 0900   MCHC 33.3 10/01/2022 1142   RDW 13.2 10/01/2022 1142   RDW 12.1 04/21/2020 1142   LYMPHSABS 2.0 10/01/2022 1142   LYMPHSABS 1.8 04/21/2020 1142   MONOABS 0.8 10/01/2022 1142   EOSABS 0.2 10/01/2022 1142   EOSABS 0.1 04/21/2020 1142   BASOSABS 0.0 10/01/2022 1142   BASOSABS 0.1 04/21/2020 1142   Absolute eos 09/12/21 - 100     Assessment & Plan:   Discussion: 72 year old male former smoker with COPD, atrial fibrillation s/p ablation in 2021/22, HLD, GERD, BPH, hypothyroidism who presents for COPD follow-up. Discussed clinical course and management of COPD including bronchodilator regimen and action plan for exacerbation. Current breakthrough symptoms on ICS/LABA however addition of LAMA cost-prohibitive   COPD - persistent symptoms, not in exacerbation --CONTINUE Advair 250-50 inhaler ONCE in the morning and evening ([employer] discussed. Patient  has account) --Trelegy 200 sample provided. This is only ONCE a day. Do NOT take with Advair --Encourage regular exercise five  days a walk up to 20-30 daily. Uses his pedometer  Pleural plaques --Reviewed CT 07/17/22. Stable  Health Maintenance Immunization History  Administered Date(s) Administered   Influenza Split 04/23/2011, 04/28/2012   Influenza Whole 03/12/2007, 04/18/2010   Influenza, High Dose Seasonal PF 03/02/2021   Influenza,inj,Quad PF,6+ Mos 04/30/2013, 04/29/2014, 05/03/2015, 05/08/2016   Influenza-Unspecified 05/13/2017, 03/26/2018, 04/06/2019, 04/05/2020   Janssen (J&J) SARS-COV-2 Vaccination 09/16/2019   Pneumococcal Conjugate-13 11/06/2016   Pneumococcal Polysaccharide-23 11/05/2017   Td 04/11/1994, 09/13/2008   Tdap 08/14/2018   CT Lung Screen - consider in the future after above CT  No orders of the defined types were placed in this encounter.  No orders of the defined types were placed in this encounter.   Return in about 4 months (around 02/11/2023).  I have spent a total time of 36-minutes on the day of the appointment including chart review, data review, collecting history, coordinating care and discussing medical diagnosis and plan with the patient/family. Past medical history, allergies, medications were reviewed. Pertinent imaging, labs and tests included in this note have been reviewed and interpreted independently by me.  Willford Rabideau Mechele Collin, MD [facility] 10/11/2022 10:40 AM  Office Number [phone]

## 2022-10-11 NOTE — Patient Instructions (Addendum)
COPD - persistent symptoms, not in exacerbation --CONTINUE Advair 250-50 inhaler ONCE in the morning and evening ([employer]RX discussed. Patient has account) --Trelegy 200 sample provided. This is only ONCE a day. Do NOT take with Advair --Encourage regular exercise five days a walk up to 20-30 daily. Uses his pedometer

## 2022-10-15 ENCOUNTER — Ambulatory Visit: Payer: Medicare Other | Admitting: Cardiology

## 2022-10-16 NOTE — Progress Notes (Unsigned)
  Cardiology Office Note:   Date:  10/18/2022  ID:  Guy Morris, DOB 02-02-1951, MRN [id number]  History of Present Illness:   Guy Morris is a 72 y.o. male who is seen for atrial fib and CAD.   He converted spontaneously when he had atrial fib.   He presented in October  2021 with jaw pain and subsequently was found to have three vessel CAD.  He is status post CABG.   Since I last saw him he was seen by Dr. Flora Lipps for evaluation prior to spinal surgery.  He had chest pain and atrial fib.  He had a negative perfusion study for ischemia.  There was inferolateral infarct.  Echo demonstrated an EF of 50%.    He had neck surgery since I saw him but he does not think it has resolved any neck pain issues he is having.  He still try to stay active.  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 The patient denies any new symptoms such as chest discomfort, neck or arm discomfort. There has been no new shortness of breath, PND or orthopnea. There have been no reported palpitations, presyncope or syncope.   ROS: As stated in the HPI and negative for all other systems.  Studies Reviewed:    EKG: Sinus rhythm, rate 57, premature atrial contractions, no acute ST-T wave changes.   Risk Assessment/Calculations:              Physical Exam:   VS:  BP 130/72 (BP Location: Left Arm, Patient Position: Sitting, Cuff Size: Normal)   Pulse (!) 57   Ht 5\' 7"  (1.702 m)   Wt 173 lb 3.2 oz (78.6 kg)   SpO2 97%   BMI 27.13 kg/m    Wt Readings from Last 3 Encounters:  10/18/22 173 lb 3.2 oz (78.6 kg)  10/11/22 173 lb 6.4 oz (78.7 kg)  10/01/22 173 lb (78.5 kg)     GEN: Well nourished, well developed in no acute distress NECK: No JVD; No carotid bruits CARDIAC: RRR, no murmurs, rubs, gallops RESPIRATORY:  Clear to auscultation without rales, wheezing or rhonchi  ABDOMEN: Soft, non-tender, non-distended EXTREMITIES:  No edema; No deformity   ASSESSMENT AND PLAN:    Coronary artery disease/CABG: He had negative  perfusion study.  He will continue with risk reduction.  Carotid stenosis:   He had 40 to 59% left stenosis.  He will have follow-up Doppler in 1 year.   PAD (peripheral artery disease) (HCC): He has a left SFA occluded.  We are again pursuing risk reduction.  Dyslipidemia: Unfortunately he does not tolerate a higher dose statin.  He could not afford other therapies.  He is going to get part D coverage and let me know as soon as he does because he would consent to PCSK9.  He does agree to continue the low-dose of statin that he is taking.  Hypertension: His blood pressure is controlled.  No change in therapy.  Atrial fibrillation: He has had atrial appendage occlusion.  He had no symptomatic recurrence of this.  We made the decision not to pursue anticoagulation given the fact that he is not having further symptoms.        Signed, Rollene Rotunda, MD

## 2022-10-17 DIAGNOSIS — G959 Disease of spinal cord, unspecified: Secondary | ICD-10-CM | POA: Diagnosis not present

## 2022-10-17 DIAGNOSIS — M4322 Fusion of spine, cervical region: Secondary | ICD-10-CM | POA: Diagnosis not present

## 2022-10-17 DIAGNOSIS — Z6826 Body mass index (BMI) 26.0-26.9, adult: Secondary | ICD-10-CM | POA: Diagnosis not present

## 2022-10-18 ENCOUNTER — Ambulatory Visit: Payer: Medicare Other | Attending: Cardiology | Admitting: Cardiology

## 2022-10-18 ENCOUNTER — Encounter: Payer: Self-pay | Admitting: Cardiology

## 2022-10-18 VITALS — BP 130/72 | HR 57 | Ht 67.0 in | Wt 173.2 lb

## 2022-10-18 DIAGNOSIS — I6523 Occlusion and stenosis of bilateral carotid arteries: Secondary | ICD-10-CM | POA: Insufficient documentation

## 2022-10-18 DIAGNOSIS — I480 Paroxysmal atrial fibrillation: Secondary | ICD-10-CM | POA: Insufficient documentation

## 2022-10-18 DIAGNOSIS — I2510 Atherosclerotic heart disease of native coronary artery without angina pectoris: Secondary | ICD-10-CM | POA: Insufficient documentation

## 2022-10-18 NOTE — Patient Instructions (Addendum)
Medication Instructions:  Your physician recommends that you continue on your current medications as directed. Please refer to the Current Medication list given to you today.  *If you need a refill on your cardiac medications before your next appointment, please call your pharmacy*   Testing/Procedures: Your physician has requested that you have a carotid duplex. This test is an ultrasound of the carotid arteries in your neck. It looks at blood flow through these arteries that supply the brain with blood. Allow one hour for this exam. There are no restrictions or special instructions. This will take place at [address] [facility], [address]. **To do in November**    Follow-Up: At [facility], you and your health needs are our priority.  As part of our continuing mission to provide you with exceptional heart care, we have created designated Provider Care Teams.  These Care Teams include your primary Cardiologist (physician) and Advanced Practice Providers (APPs -  Physician Assistants and Nurse Practitioners) who all work together to provide you with the care you need, when you need it.  We recommend signing up for the patient portal called "MyChart".  Sign up information is provided on this After Visit Summary.  MyChart is used to connect with patients for Virtual Visits (Telemedicine).  Patients are able to view lab/test results, encounter notes, upcoming appointments, etc.  Non-urgent messages can be sent to your provider as well.   To learn more about what you can do with MyChart, go to [email or web address].    Your next appointment:   12 month(s)  Provider:   Rollene Rotunda, MD

## 2022-10-22 NOTE — Addendum Note (Signed)
Addended by: Derenda Fennel on: 10/22/2022 09:44 AM   Modules accepted: Orders

## 2022-10-29 DIAGNOSIS — X32XXXD Exposure to sunlight, subsequent encounter: Secondary | ICD-10-CM | POA: Diagnosis not present

## 2022-10-29 DIAGNOSIS — L570 Actinic keratosis: Secondary | ICD-10-CM | POA: Diagnosis not present

## 2022-10-29 DIAGNOSIS — D225 Melanocytic nevi of trunk: Secondary | ICD-10-CM | POA: Diagnosis not present

## 2022-11-27 DIAGNOSIS — M4722 Other spondylosis with radiculopathy, cervical region: Secondary | ICD-10-CM | POA: Diagnosis not present

## 2022-12-20 ENCOUNTER — Telehealth: Payer: Self-pay | Admitting: Internal Medicine

## 2022-12-20 DIAGNOSIS — M4722 Other spondylosis with radiculopathy, cervical region: Secondary | ICD-10-CM | POA: Diagnosis not present

## 2022-12-20 NOTE — Telephone Encounter (Signed)
Patient dropped off document Handicap Placard, to be filled out by provider. Patient requested to send it back via Mail within 7-days. Document is located in providers tray at front office.Please advise at [facility] [phone]

## 2022-12-20 NOTE — Telephone Encounter (Signed)
Placed on Providers desk. 

## 2022-12-24 NOTE — Telephone Encounter (Signed)
Completed and has been mailed back to pt.

## 2023-01-02 DIAGNOSIS — M47812 Spondylosis without myelopathy or radiculopathy, cervical region: Secondary | ICD-10-CM | POA: Diagnosis not present

## 2023-01-03 ENCOUNTER — Other Ambulatory Visit: Payer: Self-pay | Admitting: Internal Medicine

## 2023-01-31 DIAGNOSIS — M47812 Spondylosis without myelopathy or radiculopathy, cervical region: Secondary | ICD-10-CM | POA: Diagnosis not present

## 2023-02-06 DIAGNOSIS — M47812 Spondylosis without myelopathy or radiculopathy, cervical region: Secondary | ICD-10-CM | POA: Diagnosis not present

## 2023-03-04 ENCOUNTER — Encounter (HOSPITAL_BASED_OUTPATIENT_CLINIC_OR_DEPARTMENT_OTHER): Payer: Self-pay | Admitting: Pulmonary Disease

## 2023-03-04 ENCOUNTER — Ambulatory Visit (HOSPITAL_BASED_OUTPATIENT_CLINIC_OR_DEPARTMENT_OTHER): Payer: Medicare Other | Admitting: Pulmonary Disease

## 2023-03-04 VITALS — BP 124/64 | HR 61 | Resp 16 | Ht 67.0 in | Wt 166.8 lb

## 2023-03-04 DIAGNOSIS — J449 Chronic obstructive pulmonary disease, unspecified: Secondary | ICD-10-CM | POA: Diagnosis not present

## 2023-03-04 NOTE — Patient Instructions (Signed)
COPD - persistent symptoms, not in exacerbation --CONTINUE Advair 250-50 inhaler ONCE in the morning and evening ([employer] discussed. Patient has account) --Encourage regular exercise five days a walk up to 20-30 daily. Uses his pedometer

## 2023-03-04 NOTE — Progress Notes (Signed)
Subjective:   PATIENT ID: Guy Morris GENDER: male DOB: 09-14-1950, MRN: [id number]  Chief Complaint  Patient presents with   Follow-up    COPD    Reason for Visit: Follow-up  Guy Morris is a 72 year old male former smoker with COPD, atrial fibrillation s/p ablation in 2021/22, HLD, GERD, BPH, hypothyroidism who presents for follow-up.  Initial consult He reports recent respiratory illness this has improved. He has had gradual shortness of breath in the last year. Wants to be active. Difficulty running errands. Walking shortness distances including going to doctor offices will tire him. Able to grocery shop if he stops and takes frequently breaks. Denies chronic coughing or wheezing. Had covid in 10/2019 and 06/2021.   08/02/22 He continues to have shortness of breath with exertion. However he now walks 8000 steps daily. Exercise does seem to be helping his endurance. Has not been able to pick up Anoro due to cost. Denies cough or wheezing. No exacerbations since our last visit requiring steroids or antibiotics.  10/11/22 Since our last visit he had ACDF of C3-4 in March. Reports symptoms are unchanged. Breathing is rough at times and triggered by pollen and during yardwork. Associated with chest tightness. Denies cough or wheezing. Has been using Wixela once a day. He enjoys being outdoors and taking care of his yard  and his breathing sometimes limits him. Waiting until he can get insurance for 2025 for more inhaler options.  03/04/23 Since our last visit he reports Advair but not consistently per wife. He takes allergy medicine during pollen season. Likes to be outdoors. Will get shortness of breath with activity during pollen season. Denies cough or wheezing. Walks daily. Walked 7000 steps daily  Social History: Former smoker. Quit 8 years ago in 2016. 74 pack-years Previously worked as an Personnel officer x 30 years. Located at [employer], suspected asbestos exposure   Past  Medical History:  Diagnosis Date   ABNORMAL TRANSAMINASE-LFT'S 08/04/2008   Qualifier: Diagnosis of  By: Russella Dar MD Bronson Curb T    ALLERGIC RHINITIS 05/06/2007   Qualifier: Diagnosis of  By: Jonny Ruiz MD, Len Blalock    Allergy    Anginal pain (HCC)    Intermittent. Takes Nitro PRN   Atrial fibrillation (HCC)    BPH (benign prostatic hyperplasia) 04/28/2012   COPD (chronic obstructive pulmonary disease) (HCC)    Coronary artery disease with history of myocardial infarction without history of CABG 04/11/2021   Degenerative joint disease 04/23/2011   Dysrhythmia    Hx of A.Fib   ERECTILE DYSFUNCTION 05/06/2007   Qualifier: Diagnosis of  By: Jonny Ruiz MD, Len Blalock    GERD 05/06/2007   Qualifier: Diagnosis of  By: Jonny Ruiz MD, Len Blalock    HYPERLIPIDEMIA 05/06/2007   Qualifier: Diagnosis of  By: Jonny Ruiz MD, Len Blalock    Hypertension    HYPOTHYROIDISM 05/06/2007   Qualifier: Diagnosis of  By: Jonny Ruiz MD, Len Blalock    Insomnia 04/28/2012   Myocardial infarction (HCC)    OSTEOARTHRITIS, KNEE, 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   Qualifier: Diagnosis of  By: Jonny Ruiz MD, Len Blalock    Peripheral vascular disease [facility])    Dr. Gerhard Munch   Personal history of colonic polyps 07/20/2008   Centricity Description: PERSONAL HX COLONIC POLYPS Qualifier: Diagnosis of  By: Christella Hartigan MD, Melton Alar  Centricity Description: COLONIC POLYPS, HX OF Qualifier: Diagnosis of  By: Jonny Ruiz MD, Len Blalock    Pneumonia    Hx   RLS (restless legs  syndrome) 10/17/2010   Urge incontinence 04/28/2012   vesicare heps per urology     Family History  Problem Relation Age of Onset   Cancer Mother        Breast Cancer   Arthritis Mother        RA   Cancer Father        Lung Cancer   Esophageal cancer Father    Pancreatic cancer Maternal Uncle    Stomach cancer Sister    Colon cancer Neg Hx    Colon polyps Neg Hx    Rectal cancer Neg Hx      Social History   Occupational History   Occupation: disabled - DJD knees, neck and shoulders - on SSI  Tobacco Use    Smoking status: Former    Current packs/day: 0.00    Average packs/day: 2.0 packs/day for 37.0 years (74.0 ttl pk-yrs)    Types: Cigarettes    Start date: 06/12/1963    Quit date: 06/11/2000    Years since quitting: 22.7   Smokeless tobacco: Never   Tobacco comments:    quit smoking cigarettes 2002 and quit cigars 2016  Vaping Use   Vaping status: Never Used  Substance and Sexual Activity   Alcohol use: No   Drug use: No   Sexual activity: Not Currently    Allergies  Allergen Reactions   Atorvastatin     myalgia   Claritin [Loratadine]     dizzy   Rofecoxib Itching   Rosuvastatin     myalgia   Zocor [Simvastatin] Other (See Comments)    myalgia     Outpatient Medications Prior to Visit  Medication Sig Dispense Refill   ascorbic acid (VITAMIN C) 1000 MG tablet Take 1,000 mg by mouth daily.     aspirin EC 81 MG tablet Take 1 tablet (81 mg total) by mouth daily. Swallow whole. 30 tablet 11   Ca Carbonate-Mag Hydroxide (ROLAIDS PO) Take 1 tablet by mouth 3 (three) times daily as needed (heartburn).     cholecalciferol (VITAMIN D3) 25 MCG (1000 UNIT) tablet Take 1,000 Units by mouth daily.     cyclobenzaprine (FLEXERIL) 10 MG tablet Take 1 tablet by mouth three times daily as needed for muscle spasm 30 tablet 2   diclofenac (VOLTAREN) 75 MG EC tablet Take 75 mg by mouth 2 (two) times daily.     fluticasone-salmeterol (WIXELA INHUB) 250-50 MCG/ACT AEPB Inhale 1 puff into the lungs in the morning and at bedtime. 60 each 5   lansoprazole (PREVACID) 15 MG capsule Take 15 mg by mouth daily at 12 noon.     levothyroxine (SYNTHROID) 200 MCG tablet Take 1 tablet (200 mcg total) by mouth daily. 90 tablet 3   meclizine (ANTIVERT) 12.5 MG tablet TAKE 1 TABLET BY MOUTH THREE TIMES DAILY AS NEEDED FOR DIZZINESS 30 tablet 0   metoprolol tartrate (LOPRESSOR) 50 MG tablet Take 1/2 tablet ( 25 mg ) twice a day (Patient taking differently: Take 50 mg by mouth daily.) 90 tablet 3   nitroGLYCERIN  (NITROSTAT) 0.4 MG SL tablet Place 1 tablet (0.4 mg total) under the tongue every 5 (five) minutes as needed for chest pain. 90 tablet 3   Turmeric Curcumin 500 MG CAPS Take 1,000 mg by mouth daily.     Cholecalciferol (THERA-D 2000) 50 MCG (2000 UT) TABS 1 tab by mouth once daily 30 tablet 99   citalopram (CELEXA) 10 MG tablet Take 1 tablet (10 mg total) by mouth daily.  90 tablet 3   Fluticasone-Umeclidin-Vilant (TRELEGY ELLIPTA) 200-62.5-25 MCG/ACT AEPB Inhale 1 puff into the lungs daily. 14 each 0   Fluticasone-Umeclidin-Vilant 200-62.5-25 MCG/ACT AEPB Inhale 1 puff into the lungs daily. 28 each 0   oxyCODONE (OXY IR/ROXICODONE) 5 MG immediate release tablet Take 1 tablet (5 mg total) by mouth every 4 (four) hours as needed for moderate pain ((score 4 to 6)). 30 tablet 0   polyethylene glycol (MIRALAX / GLYCOLAX) 17 g packet Take 17 g by mouth daily. 14 each 0   predniSONE (DELTASONE) 10 MG tablet 3 tabs by mouth per day for 3 days,2tabs per day for 3 days,1tab per day for 3 days 18 tablet 0   vitamin B-12 (CYANOCOBALAMIN) 1000 MCG tablet Take 1 tablet (1,000 mcg total) by mouth daily. 90 tablet 1   vitamin B-12 (CYANOCOBALAMIN) 250 MCG tablet Take 250 mcg by mouth daily.     No facility-administered medications prior to visit.    Review of Systems  Constitutional:  Negative for chills, diaphoresis, fever, malaise/fatigue and weight loss.  HENT:  Negative for congestion.   Respiratory:  Positive for shortness of breath. Negative for cough, hemoptysis, sputum production and wheezing.   Cardiovascular:  Negative for chest pain, palpitations and leg swelling.     Objective:   Vitals:   03/04/23 1606  BP: 124/64  Pulse: 61  Resp: 16  SpO2: 92%  Weight: 166 lb 12.8 oz (75.7 kg)  Height: 5\' 7"  (1.702 m)  SpO2: 92 %  Physical Exam: General: Well-appearing, no acute distress HENT: [place], AT Eyes: EOMI, no scleral icterus Respiratory: Diminished breath sounds clear to auscultation  bilaterally.  No crackles, wheezing or rales Cardiovascular: RRR, -M/R/G, no JVD Extremities:-Edema,-tenderness Neuro: AAO x4, CNII-XII grossly intact Psych: Normal mood, normal affect  Data Reviewed:  Imaging: CT A/P 06/29/21 - Lower lobes with normal parenchyma CXR 05/22/22 - Right lateral chest with increased nodularity. Pleural plaques. Subtle nodularity in left base CT Chest 07/17/22 - Bilateral pleural plaques, calcified. Emphysema, mild  PFT: 04/25/20 FVC 4.09 (98%) FEV1 2.71 (88%) Ratio 66  Interpretation: Mild obstructive defect based on GOLD's criteria  07/02/22 FVC 4.01 (102%) FEV1 2.51 (866%) Ratio 62  TLC 117% RV 168% DLCO 93% Interpretation: Mild obstructive defect with air trapping   Labs: CBC    Component Value Date/Time   WBC 6.8 10/01/2022 1142   RBC 5.04 10/01/2022 1142   HGB 15.8 10/01/2022 1142   HGB 15.9 04/21/2020 1142   HCT 47.3 10/01/2022 1142   HCT 46.4 04/21/2020 1142   PLT 302.0 10/01/2022 1142   PLT 255 04/21/2020 1142   MCV 93.9 10/01/2022 1142   MCV 92 04/21/2020 1142   MCH 31.4 08/29/2022 0900   MCHC 33.3 10/01/2022 1142   RDW 13.2 10/01/2022 1142   RDW 12.1 04/21/2020 1142   LYMPHSABS 2.0 10/01/2022 1142   LYMPHSABS 1.8 04/21/2020 1142   MONOABS 0.8 10/01/2022 1142   EOSABS 0.2 10/01/2022 1142   EOSABS 0.1 04/21/2020 1142   BASOSABS 0.0 10/01/2022 1142   BASOSABS 0.1 04/21/2020 1142   Absolute eos 09/12/21 - 100     Assessment & Plan:   Discussion: 72 year old male former smoker with COPD, atrial fibrillation s/p ablation in 2021/22, HLD, GERD, BPH, hypothyroidism who presents for COPD follow-up. Discussed clinical course and management of COPD including bronchodilator regimen and action plan for exacerbation. Current breakthrough symptoms on ICS/LABA however addition of LAMA cost-prohibitive   COPD - persistent symptoms, not in exacerbation --  CONTINUE Advair 250-50 inhaler ONCE in the morning and evening ([facility] discussed. Patient  has account) --Encourage regular exercise five days a walk up to 20-30 min daily. Uses his pedometer  Pleural plaques --Reviewed CT 07/17/22. Stable  Health Maintenance Immunization History  Administered Date(s) Administered   Influenza Split 04/23/2011, 04/28/2012   Influenza Whole 03/12/2007, 04/18/2010   Influenza, High Dose Seasonal PF 03/02/2021   Influenza,inj,Quad PF,6+ Mos 04/30/2013, 04/29/2014, 05/03/2015, 05/08/2016   Influenza-Unspecified 05/13/2017, 03/26/2018, 04/06/2019, 04/05/2020   Janssen (J&J) SARS-COV-2 Vaccination 09/16/2019   Pneumococcal Conjugate-13 11/06/2016   Pneumococcal Polysaccharide-23 11/05/2017   Td 04/11/1994, 09/13/2008   Tdap 08/14/2018   CT Lung Screen - consider in the future after above CT  No orders of the defined types were placed in this encounter.  No orders of the defined types were placed in this encounter.   Return for December.  I have spent a total time of 20-minutes on the day of the appointment including chart review, data review, collecting history, coordinating care and discussing medical diagnosis and plan with the patient/family. Past medical history, allergies, medications were reviewed. Pertinent imaging, labs and tests included in this note have been reviewed and interpreted independently by me.  Jatavion Peaster Mechele Collin, MD [facility] 03/04/2023 10:10 PM  Office Number [phone]

## 2023-03-06 DIAGNOSIS — M4322 Fusion of spine, cervical region: Secondary | ICD-10-CM | POA: Diagnosis not present

## 2023-03-06 DIAGNOSIS — G894 Chronic pain syndrome: Secondary | ICD-10-CM | POA: Diagnosis not present

## 2023-03-06 DIAGNOSIS — M47812 Spondylosis without myelopathy or radiculopathy, cervical region: Secondary | ICD-10-CM | POA: Diagnosis not present

## 2023-03-27 ENCOUNTER — Other Ambulatory Visit: Payer: Self-pay | Admitting: Cardiology

## 2023-04-02 ENCOUNTER — Encounter: Payer: Self-pay | Admitting: Internal Medicine

## 2023-04-02 ENCOUNTER — Ambulatory Visit (INDEPENDENT_AMBULATORY_CARE_PROVIDER_SITE_OTHER): Payer: Medicare Other | Admitting: Internal Medicine

## 2023-04-02 VITALS — BP 122/78 | HR 41 | Temp 97.7°F | Ht 67.0 in | Wt 170.0 lb

## 2023-04-02 DIAGNOSIS — E7800 Pure hypercholesterolemia, unspecified: Secondary | ICD-10-CM

## 2023-04-02 DIAGNOSIS — E538 Deficiency of other specified B group vitamins: Secondary | ICD-10-CM | POA: Diagnosis not present

## 2023-04-02 DIAGNOSIS — E559 Vitamin D deficiency, unspecified: Secondary | ICD-10-CM | POA: Diagnosis not present

## 2023-04-02 DIAGNOSIS — R030 Elevated blood-pressure reading, without diagnosis of hypertension: Secondary | ICD-10-CM | POA: Diagnosis not present

## 2023-04-02 DIAGNOSIS — G894 Chronic pain syndrome: Secondary | ICD-10-CM

## 2023-04-02 DIAGNOSIS — R739 Hyperglycemia, unspecified: Secondary | ICD-10-CM

## 2023-04-02 DIAGNOSIS — J439 Emphysema, unspecified: Secondary | ICD-10-CM

## 2023-04-02 MED ORDER — CYCLOBENZAPRINE HCL 10 MG PO TABS: 10.0000 mg | ORAL_TABLET | Freq: Three times a day (TID) | ORAL | 5 refills | Status: DC | PRN

## 2023-04-02 MED ORDER — EZETIMIBE 10 MG PO TABS: 10.0000 mg | ORAL_TABLET | Freq: Every day | ORAL | 3 refills | Status: AC
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
## 2023-04-02 NOTE — Patient Instructions (Signed)
Please take all new medication as prescribed - the zetia 10 mg per day for cholesterol  Please continue all other medications as before, and refills have been done if requested.  Please have the pharmacy call with any other refills you may need.  Please continue your efforts at being more active, low cholesterol diet, and weight control  Please keep your appointments with your specialists as you may have planned  We can hold on further lab today  Please make an Appointment to return in 6 months, or sooner if needed

## 2023-04-04 DIAGNOSIS — Z23 Encounter for immunization: Secondary | ICD-10-CM | POA: Diagnosis not present

## 2023-04-06 ENCOUNTER — Encounter: Payer: Self-pay | Admitting: Internal Medicine

## 2023-04-06 NOTE — Assessment & Plan Note (Signed)
BP Readings from Last 3 Encounters:  04/02/23 122/78  03/04/23 124/64  10/18/22 130/72   Stable, pt to continue medical treatment lopressor 25 bid

## 2023-04-06 NOTE — Assessment & Plan Note (Signed)
Lab Results  Component Value Date   VITAMINB12 1,059 (H) 10/01/2022   Stable, cont oral replacement - b12 1000 mcg qd

## 2023-04-06 NOTE — Assessment & Plan Note (Signed)
Last vitamin D Lab Results  Component Value Date   VD25OH 34.67 10/01/2022   Low, to start oral replacement

## 2023-04-06 NOTE — Assessment & Plan Note (Signed)
Ok for flexeril  tid prn

## 2023-04-06 NOTE — Assessment & Plan Note (Signed)
Lab Results  Component Value Date   LDLCALC 133 (H) 10/01/2022   Uncontrolled, pt has been statin intolerant, to start zetia 10 every day, decliens repatha

## 2023-04-06 NOTE — Assessment & Plan Note (Signed)
Lab Results  Component Value Date   HGBA1C 6.2 10/01/2022   Stable, pt to continue current medical treatment  - diet, wt control

## 2023-04-06 NOTE — Assessment & Plan Note (Signed)
Stable, cont inhaler prn 

## 2023-04-09 ENCOUNTER — Telehealth: Payer: Self-pay

## 2023-04-09 NOTE — Telephone Encounter (Signed)
ERR

## 2023-04-22 ENCOUNTER — Ambulatory Visit (HOSPITAL_COMMUNITY)
Admission: RE | Admit: 2023-04-22 | Discharge: 2023-04-22 | Disposition: A | Discharge status: Home / Self Care | Payer: Medicare Other | Source: Ambulatory Visit | Attending: Cardiology | Admitting: Cardiology

## 2023-04-22 DIAGNOSIS — I6523 Occlusion and stenosis of bilateral carotid arteries: Secondary | ICD-10-CM | POA: Diagnosis not present

## 2023-04-22 DIAGNOSIS — I2510 Atherosclerotic heart disease of native coronary artery without angina pectoris: Secondary | ICD-10-CM | POA: Diagnosis not present

## 2023-05-01 ENCOUNTER — Encounter: Payer: Self-pay | Admitting: *Deleted

## 2023-05-15 DIAGNOSIS — M47812 Spondylosis without myelopathy or radiculopathy, cervical region: Secondary | ICD-10-CM | POA: Diagnosis not present

## 2023-05-15 DIAGNOSIS — M5416 Radiculopathy, lumbar region: Secondary | ICD-10-CM | POA: Diagnosis not present

## 2023-05-15 DIAGNOSIS — M4722 Other spondylosis with radiculopathy, cervical region: Secondary | ICD-10-CM | POA: Diagnosis not present

## 2023-05-15 DIAGNOSIS — M4322 Fusion of spine, cervical region: Secondary | ICD-10-CM | POA: Diagnosis not present

## 2023-05-15 DIAGNOSIS — G894 Chronic pain syndrome: Secondary | ICD-10-CM | POA: Diagnosis not present

## 2023-05-28 DIAGNOSIS — L570 Actinic keratosis: Secondary | ICD-10-CM | POA: Diagnosis not present

## 2023-05-28 DIAGNOSIS — X32XXXD Exposure to sunlight, subsequent encounter: Secondary | ICD-10-CM | POA: Diagnosis not present

## 2023-05-31 ENCOUNTER — Encounter (HOSPITAL_BASED_OUTPATIENT_CLINIC_OR_DEPARTMENT_OTHER): Payer: Self-pay | Admitting: Pulmonary Disease

## 2023-05-31 ENCOUNTER — Ambulatory Visit (HOSPITAL_BASED_OUTPATIENT_CLINIC_OR_DEPARTMENT_OTHER): Payer: Medicare Other | Admitting: Pulmonary Disease

## 2023-05-31 VITALS — BP 126/78 | HR 61 | Resp 16 | Ht 67.0 in | Wt 171.0 lb

## 2023-05-31 DIAGNOSIS — J441 Chronic obstructive pulmonary disease with (acute) exacerbation: Secondary | ICD-10-CM | POA: Insufficient documentation

## 2023-05-31 MED ORDER — PREDNISONE 10 MG PO TABS: ORAL_TABLET | ORAL | 0 refills | Status: AC

## 2023-05-31 MED ORDER — TRELEGY ELLIPTA 200-62.5-25 MCG/ACT IN AEPB: 1.0000 | INHALATION_SPRAY | Freq: Every day | RESPIRATORY_TRACT | Status: DC

## 2023-05-31 NOTE — Patient Instructions (Signed)
  COPD exacerbation --Prednisone taper --CONTINUE Wixela 250-50 inhaler ONCE in the morning and evening ([employer] discussed. Patient has account) --Will check for Trelegy samples if available --Encourage regular exercise five days a walk up to 20-30 min daily. Uses his pedometer

## 2023-05-31 NOTE — Progress Notes (Signed)
Subjective:   PATIENT ID: Guy Morris GENDER: male DOB: 1950/11/09, MRN: [id number]  Chief Complaint  Patient presents with   Follow-up    Breathing seems to be getting worse.     Reason for Visit: Follow-up  Mr. Aldahir Gordillo is a 72 year old male former smoker with COPD, atrial fibrillation s/p ablation in 2021/22, HLD, GERD, BPH, hypothyroidism who presents for follow-up.  Initial consult He reports recent respiratory illness this has improved. He has had gradual shortness of breath in the last year. Wants to be active. Difficulty running errands. Walking shortness distances including going to doctor offices will tire him. Able to grocery shop if he stops and takes frequently breaks. Denies chronic coughing or wheezing. Had covid in 10/2019 and 06/2021.   08/02/22 He continues to have shortness of breath with exertion. However he now walks 8000 steps daily. Exercise does seem to be helping his endurance. Has not been able to pick up Anoro due to cost. Denies cough or wheezing. No exacerbations since our last visit requiring steroids or antibiotics.  10/11/22 Since our last visit he had ACDF of C3-4 in March. Reports symptoms are unchanged. Breathing is rough at times and triggered by pollen and during yardwork. Associated with chest tightness. Denies cough or wheezing. Has been using Wixela once a day. He enjoys being outdoors and taking care of his yard  and his breathing sometimes limits him. Waiting until he can get insurance for 2025 for more inhaler options.  03/04/23 Since our last visit he reports Advair but not consistently per wife. He takes allergy medicine during pollen season. Likes to be outdoors. Will get shortness of breath with activity during pollen season. Denies cough or wheezing. Walks daily. Walked 7000 steps daily  05/31/23 Since our last visit he reports that his oxygen has been in the low 90s. He reports compliant with Wixela. He reports coughing more and  worsening shortness of breath in the last few days. Symptoms worsen while laying down. No wheezing. Denies fevers and chills.  Social History: Former smoker. Quit 8 years ago in 2016. 74 pack-years Previously worked as an Personnel officer x 30 years. Located at [employer], suspected asbestos exposure   Past Medical History:  Diagnosis Date   ABNORMAL TRANSAMINASE-LFT'S 08/04/2008   Qualifier: Diagnosis of  By: Russella Dar MD Bronson Curb T    ALLERGIC RHINITIS 05/06/2007   Qualifier: Diagnosis of  By: Jonny Ruiz MD, Len Blalock    Allergy    Anginal pain (HCC)    Intermittent. Takes Nitro PRN   Atrial fibrillation (HCC)    BPH (benign prostatic hyperplasia) 04/28/2012   COPD (chronic obstructive pulmonary disease) (HCC)    Coronary artery disease with history of myocardial infarction without history of CABG 04/11/2021   Degenerative joint disease 04/23/2011   Dysrhythmia    Hx of A.Fib   ERECTILE DYSFUNCTION 05/06/2007   Qualifier: Diagnosis of  By: Jonny Ruiz MD, Len Blalock    GERD 05/06/2007   Qualifier: Diagnosis of  By: Jonny Ruiz MD, Len Blalock    HYPERLIPIDEMIA 05/06/2007   Qualifier: Diagnosis of  By: Jonny Ruiz MD, Len Blalock    Hypertension    HYPOTHYROIDISM 05/06/2007   Qualifier: Diagnosis of  By: Jonny Ruiz MD, Len Blalock    Insomnia 04/28/2012   Myocardial infarction (HCC)    OSTEOARTHRITIS, KNEE, 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   Qualifier: Diagnosis of  By: Jonny Ruiz MD, Len Blalock    Peripheral vascular disease [facility])    Dr. Gerhard Munch  Personal history of colonic polyps 07/20/2008   Centricity Description: PERSONAL HX COLONIC POLYPS Qualifier: Diagnosis of  By: Christella Hartigan MD, Melton Alar  Centricity Description: COLONIC POLYPS, HX OF Qualifier: Diagnosis of  By: Jonny Ruiz MD, Len Blalock    Pneumonia    Hx   RLS (restless legs syndrome) 10/17/2010   Urge incontinence 04/28/2012   vesicare heps per urology     Family History  Problem Relation Age of Onset   Cancer Mother        Breast Cancer   Arthritis Mother        RA   Cancer  Father        Lung Cancer   Esophageal cancer Father    Pancreatic cancer Maternal Uncle    Stomach cancer Sister    Colon cancer Neg Hx    Colon polyps Neg Hx    Rectal cancer Neg Hx      Social History   Occupational History   Occupation: disabled - DJD knees, neck and shoulders - on SSI  Tobacco Use   Smoking status: Former    Current packs/day: 0.00    Average packs/day: 2.0 packs/day for 37.0 years (74.0 ttl pk-yrs)    Types: Cigarettes    Start date: 06/12/1963    Quit date: 06/11/2000    Years since quitting: 22.9   Smokeless tobacco: Never   Tobacco comments:    quit smoking cigarettes 2002 and quit cigars 2016  Vaping Use   Vaping status: Never Used  Substance and Sexual Activity   Alcohol use: No   Drug use: No   Sexual activity: Not Currently    Allergies  Allergen Reactions   Atorvastatin     myalgia   Claritin [Loratadine]     dizzy   Rofecoxib Itching   Rosuvastatin     myalgia   Zocor [Simvastatin] Other (See Comments)    myalgia     Outpatient Medications Prior to Visit  Medication Sig Dispense Refill   ascorbic acid (VITAMIN C) 1000 MG tablet Take 1,000 mg by mouth daily.     aspirin EC 81 MG tablet Take 1 tablet (81 mg total) by mouth daily. Swallow whole. 30 tablet 11   Ca Carbonate-Mag Hydroxide (ROLAIDS PO) Take 1 tablet by mouth 3 (three) times daily as needed (heartburn).     cholecalciferol (VITAMIN D3) 25 MCG (1000 UNIT) tablet Take 1,000 Units by mouth daily.     cyclobenzaprine (FLEXERIL) 10 MG tablet Take 1 tablet (10 mg total) by mouth 3 (three) times daily as needed. for muscle spams 30 tablet 5   ezetimibe (ZETIA) 10 MG tablet Take 1 tablet (10 mg total) by mouth daily. 90 tablet 3   fluticasone-salmeterol (WIXELA INHUB) 250-50 MCG/ACT AEPB Inhale 1 puff into the lungs in the morning and at bedtime. 60 each 5   levothyroxine (SYNTHROID) 200 MCG tablet Take 1 tablet (200 mcg total) by mouth daily. 90 tablet 3   meclizine (ANTIVERT)  12.5 MG tablet TAKE 1 TABLET BY MOUTH THREE TIMES DAILY AS NEEDED FOR DIZZINESS 30 tablet 0   metoprolol tartrate (LOPRESSOR) 50 MG tablet Take 1/2 (one-half) tablet by mouth twice daily 90 tablet 0   pregabalin (LYRICA) 50 MG capsule Take 1 to 2 tablets twice a day as instructed.     traMADol (ULTRAM) 50 MG tablet Take by mouth.     Turmeric Curcumin 500 MG CAPS Take 1,000 mg by mouth daily.     nitroGLYCERIN (NITROSTAT) 0.4 MG  SL tablet Place 1 tablet (0.4 mg total) under the tongue every 5 (five) minutes as needed for chest pain. 90 tablet 3   lansoprazole (PREVACID) 15 MG capsule Take 15 mg by mouth daily at 12 noon.     No facility-administered medications prior to visit.    ROS   Objective:   Vitals:   05/31/23 1024  BP: 126/78  Pulse: 61  Resp: 16  SpO2: 94%  Weight: 171 lb (77.6 kg)  Height: 5\' 7"  (1.702 m)  SpO2: 94 %  Physical Exam: General: Well-appearing, no acute distress HENT: [place], AT Eyes: EOMI, no scleral icterus Respiratory: Clear to auscultation bilaterally.  No crackles, wheezing or rales Cardiovascular: RRR, -M/R/G, no JVD Extremities:-Edema,-tenderness Neuro: AAO x4, CNII-XII grossly intact Psych: Normal mood, normal affect   Data Reviewed:  Imaging: CT A/P 06/29/21 - Lower lobes with normal parenchyma CXR 05/22/22 - Right lateral chest with increased nodularity. Pleural plaques. Subtle nodularity in left base CT Chest 07/17/22 - Bilateral pleural plaques, calcified. Emphysema, mild  PFT: 04/25/20 FVC 4.09 (98%) FEV1 2.71 (88%) Ratio 66  Interpretation: Mild obstructive defect based on GOLD's criteria  07/02/22 FVC 4.01 (102%) FEV1 2.51 (866%) Ratio 62  TLC 117% RV 168% DLCO 93% Interpretation: Mild obstructive defect with air trapping   Labs: CBC    Component Value Date/Time   WBC 6.8 10/01/2022 1142   RBC 5.04 10/01/2022 1142   HGB 15.8 10/01/2022 1142   HGB 15.9 04/21/2020 1142   HCT 47.3 10/01/2022 1142   HCT 46.4 04/21/2020 1142    PLT 302.0 10/01/2022 1142   PLT 255 04/21/2020 1142   MCV 93.9 10/01/2022 1142   MCV 92 04/21/2020 1142   MCH 31.4 08/29/2022 0900   MCHC 33.3 10/01/2022 1142   RDW 13.2 10/01/2022 1142   RDW 12.1 04/21/2020 1142   LYMPHSABS 2.0 10/01/2022 1142   LYMPHSABS 1.8 04/21/2020 1142   MONOABS 0.8 10/01/2022 1142   EOSABS 0.2 10/01/2022 1142   EOSABS 0.1 04/21/2020 1142   BASOSABS 0.0 10/01/2022 1142   BASOSABS 0.1 04/21/2020 1142   Absolute eos 09/12/21 - 100     Assessment & Plan:   Discussion: 72 year old male former smoker with COPD, atrial fibrillation s/p ablation in 2021/22, HLD, GERD, BPH, hypothyroidism who presents for COPD follow-up. Discussed clinical course and management of COPD including bronchodilator regimen and action plan for exacerbation. Currently in exacerbation despite Trelegy sample.   Current breakthrough symptoms on maintenance ICS/LABA however addition of LAMA cost-prohibitive   COPD - persistent symptoms, in exacerbation --Prednisone taper --CONTINUE Wixela 250-50 inhaler ONCE in the morning and evening ([facility] discussed. Patient has account) --Will check for Trelegy samples if available --Encourage regular exercise five days a walk up to 20-30 min daily. Uses his pedometer  Pleural plaques --Reviewed CT 07/17/22. Stable  Health Maintenance Immunization History  Administered Date(s) Administered   Influenza Split 04/23/2011, 04/28/2012   Influenza Whole 03/12/2007, 04/18/2010   Influenza, High Dose Seasonal PF 03/02/2021   Influenza,inj,Quad PF,6+ Mos 04/30/2013, 04/29/2014, 05/03/2015, 05/08/2016   Influenza-Unspecified 05/13/2017, 03/26/2018, 04/06/2019, 04/05/2020, 04/04/2023   Janssen (J&J) SARS-COV-2 Vaccination 09/16/2019   Pneumococcal Conjugate-13 11/06/2016   Pneumococcal Polysaccharide-23 11/05/2017   Td 04/11/1994, 09/13/2008   Tdap 08/14/2018   CT Lung Screen - consider in the future after above CT  No orders of the defined types were  placed in this encounter.  Meds ordered this encounter  Medications   predniSONE (DELTASONE) 10 MG tablet  Sig: Take 4 tablets (40 mg total) by mouth daily with breakfast for 2 days, THEN 3 tablets (30 mg total) daily with breakfast for 2 days, THEN 2 tablets (20 mg total) daily with breakfast for 2 days, THEN 1 tablet (10 mg total) daily with breakfast for 2 days.    Dispense:  20 tablet    Refill:  0    Return in about 3 months (around 08/29/2023).  I have spent a total time of 30-minutes on the day of the appointment including chart review, data review, collecting history, coordinating care and discussing medical diagnosis and plan with the patient/family. Past medical history, allergies, medications were reviewed. Pertinent imaging, labs and tests included in this note have been reviewed and interpreted independently by me.  Theoplis Garciagarcia Mechele Collin, MD [facility] 05/31/2023 10:39 AM  Office Number [phone]

## 2023-05-31 NOTE — Addendum Note (Signed)
Addended by: Jama Flavors on: 05/31/2023 10:46 AM   Modules accepted: Orders

## 2023-06-25 ENCOUNTER — Other Ambulatory Visit: Payer: Self-pay | Admitting: Cardiology

## 2023-07-03 ENCOUNTER — Other Ambulatory Visit: Payer: Self-pay

## 2023-07-03 ENCOUNTER — Other Ambulatory Visit: Payer: Self-pay | Admitting: Physician Assistant

## 2023-07-03 ENCOUNTER — Ambulatory Visit: Payer: Medicare HMO | Attending: Physician Assistant | Admitting: Physician Assistant

## 2023-07-03 ENCOUNTER — Ambulatory Visit (INDEPENDENT_AMBULATORY_CARE_PROVIDER_SITE_OTHER): Payer: Medicare HMO

## 2023-07-03 ENCOUNTER — Encounter: Payer: Self-pay | Admitting: Physician Assistant

## 2023-07-03 VITALS — BP 126/74 | HR 63 | Ht 68.0 in | Wt 170.2 lb

## 2023-07-03 DIAGNOSIS — R002 Palpitations: Secondary | ICD-10-CM

## 2023-07-03 DIAGNOSIS — E785 Hyperlipidemia, unspecified: Secondary | ICD-10-CM

## 2023-07-03 DIAGNOSIS — I480 Paroxysmal atrial fibrillation: Secondary | ICD-10-CM

## 2023-07-03 DIAGNOSIS — Z136 Encounter for screening for cardiovascular disorders: Secondary | ICD-10-CM | POA: Diagnosis not present

## 2023-07-03 DIAGNOSIS — I10 Essential (primary) hypertension: Secondary | ICD-10-CM

## 2023-07-03 DIAGNOSIS — I25700 Atherosclerosis of coronary artery bypass graft(s), unspecified, with unstable angina pectoris: Secondary | ICD-10-CM | POA: Diagnosis not present

## 2023-07-03 DIAGNOSIS — Z01818 Encounter for other preprocedural examination: Secondary | ICD-10-CM | POA: Diagnosis not present

## 2023-07-03 DIAGNOSIS — I2089 Other forms of angina pectoris: Secondary | ICD-10-CM

## 2023-07-03 DIAGNOSIS — I739 Peripheral vascular disease, unspecified: Secondary | ICD-10-CM | POA: Diagnosis not present

## 2023-07-03 NOTE — Patient Instructions (Signed)
Medication Instructions:  NO CHANGES *If you need a refill on your cardiac medications before your next appointment, please call your pharmacy*   Lab Work:  CBC AND BMET TODAY If you have labs (blood work) drawn today and your tests are completely normal, you will receive your results only by: MyChart Message (if you have MyChart) OR A paper copy in the mail If you have any lab test that is abnormal or we need to change your treatment, we will call you to review the results.   Testing/Procedures: ZIO AT Long term monitor-Live Telemetry  Your physician has requested you wear a ZIO patch monitor for 14 days.  This is a single patch monitor. [employer] supplies one patch monitor per enrollment. Additional  stickers are not available.  Please do not apply patch if you will be having a Nuclear Stress Test, Echocardiogram, Cardiac CT, MRI,  or Chest Xray during the period you would be wearing the monitor. The patch cannot be worn during  these tests. You cannot remove and re-apply the ZIO AT patch monitor.  Your ZIO patch monitor will be mailed 3 day USPS to your address on file. It may take 3-5 days to  receive your monitor after you have been enrolled.  Once you have received your monitor, please review the enclosed instructions. Your monitor has  already been registered assigning a specific monitor serial # to you.   Billing and Patient Assistance Program information  Guy Morris has been supplied with any insurance information on record for billing. [employer] offers a sliding scale Patient Assistance Program for patients without insurance, or whose  insurance does not completely cover the cost of the ZIO patch monitor. You must apply for the  Patient Assistance Program to qualify for the discounted rate. To apply, call [employer] at [phone],  select option 4, select option 2 , ask to apply for the Patient Assistance Program, (you can request an  interpreter if needed). [employer] will ask your  household income and how many people are in your  household. [employer] will quote your out-of-pocket cost based on this information. They will also be able  to set up a 12 month interest free payment plan if needed.  Applying the monitor   Shave hair from upper left chest.  Hold the abrader disc by orange tab. Rub the abrader in 40 strokes over left upper chest as indicated in  your monitor instructions.  Clean area with 4 enclosed alcohol pads. Use all pads to ensure the area is cleaned thoroughly. Let  dry.  Apply patch as indicated in monitor instructions. Patch will be placed under collarbone on left side of  chest with arrow pointing upward.  Rub patch adhesive wings for 2 minutes. Remove the white label marked "1". Remove the white label  marked "2". Rub patch adhesive wings for 2 additional minutes.  While looking in a mirror, press and release button in center of patch. A small green light will flash 3-4  times. This will be your only indicator that the monitor has been turned on.  Do not shower for the first 24 hours. You may shower after the first 24 hours.  Press the button if you feel a symptom. You will hear a small click. Record Date, Time and Symptom in  the Patient Log.   Starting the Gateway  In your kit there is a Audiological scientist box the size of a cellphone. This is Buyer, retail. It transmits all your  recorded data to [facility].  This box must always stay within 10 feet of you. Open the box and push the *  button. There will be a light that blinks orange and then green a few times. When the light stops  blinking, the Gateway is connected to the ZIO patch. Call [employer] at [phone] to confirm your monitor is transmitting.  Returning your monitor  Remove your patch and place it inside the Gateway. In the lower half of the Gateway there is a white  bag with prepaid postage on it. Place Gateway in bag and seal. Mail package back to [place] as soon as  possible. Your  physician should have your final report approximately 7 days after you have mailed back  your monitor. Call [facility] Customer Care at [phone] if you have questions regarding your ZIO AT  patch monitor. Call them immediately if you see an orange light blinking on your monitor.  If your monitor falls off in less than 4 days, contact our Monitor department at [phone]. If your  monitor becomes loose or falls off after 4 days call [employer] at [phone] for suggestions on  securing your monitor    Follow-Up: At [facility], you and your health needs are our priority.  As part of our continuing mission to provide you with exceptional heart care, we have created designated Provider Care Teams.  These Care Teams include your primary Cardiologist (physician) and Advanced Practice Providers (APPs -  Physician Assistants and Nurse Practitioners) who all work together to provide you with the care you need, when you need it.  Your next appointment:   3 week(s)  Provider:   Azalee Course, PA  Other Instructions  [facility] A DEPT OF [place]. [facility] AT [facility] [address] [place] [address] Dept: [phone] Loc: [phone]  Guy Morris  07/03/2023  You are scheduled for a Cardiac Catheterization on Friday, January 27 with Dr.  Jacinto Halim .  1. Please arrive at the [facility] (Main Entrance A) at [facility]: [address] [place], [place] [address] at 10:00 AM (This time is 2 hour(s) before your procedure to ensure your preparation).   Free valet parking service is available. You will check in at ADMITTING. The support person will be asked to wait in the waiting room.  It is OK to have someone drop you off and come back when you are ready to be discharged.    Special note: Every effort is made to have your procedure done on time. Please understand that emergencies sometimes delay  scheduled procedures.  2. Diet: Do not eat solid foods after midnight.  The patient may have clear liquids until 5am upon the day of the procedure.  3. Labs: You will need to have blood drawn on  CBC BMP  today  You do not need to be fasting.  4. Medication instructions in preparation for your procedure:   Contrast Allergy: No   On the morning of your procedure, take your Aspirin 81 mg and any morning medicines NOT listed above.  You may use sips of water.  5. Plan to go home the same day, you will only stay overnight if medically necessary. 6. Bring a current list of your medications and current insurance cards. 7. You MUST have a responsible person to drive you home. 8. Someone MUST be with you the first 24 hours after you arrive home or your discharge will be delayed. 9. Please  wear clothes that are easy to get on and off and wear slip-on shoes.  Thank you for allowing [place] to care for you!   -- [place] [facility]

## 2023-07-03 NOTE — Progress Notes (Signed)
Cardiology Office Note:  .   Date:  07/03/2023  ID:  Guy Morris, DOB 19-Mar-1951, MRN [id number] PCP: Corwin Levins, MD  [facility] Providers Cardiologist:  Rollene Rotunda, MD     History of Present Illness: .   Guy Morris is a 73 y.o. male with past medical history of CAD s/p CABG, PAD with known occluded L SFA, COPD, former smoker, atrial fibrillation, HTN and HLD.  He converted spontaneously when he had A-fib.  He had jaw pain in October 2021 and subsequently found to have three-vessel CAD.  He ultimately underwent four-vessel CABG with LIMA-LAD, SVG-OM, SVG-PDA, SVG-diagonal, clipping of the atrial appendage, MAZE procedure of left atrium with bilateral pulmonary vein isolation with RF ablation by Dr. Donata Clay on 04/28/2020.  Echocardiogram obtained on 05/03/2020 showed EF 50 to 55%, moderate hypokinesis of the LV, basal mid inferolateral wall, normal RV, no significant valve issue.  Due to prior history of A-fib, he was prophylactically placed on amiodarone during perioperative period.  Postprocedure, he converted in and out of atrial fibrillation with elevated heart rate.  Lopressor dose was titrated.  He has not been able to tolerate higher dose of statin and cannot afford other therapies.  Previous noninvasive study has revealed evidence of SFA occlusion on the left side, he was seen by Dr. Arbie Cookey of vascular surgery on 12/06/2021, given lack of any claudication symptom or evidence of critical limb ischemia, it was recommended to continue walking program.  He is not on anticoagulation therapy.  As part of his preoperative evaluation prior to spinal fusion surgery, he was seen by Dr. Flora Lipps on 07/30/2022 at which time he was having intermittent A-fib and also chest pain.  Chest pain was nonexertional and only last seconds at a time.  He says he has A-fib episode about 2-3 times a year.  He underwent Myoview on 08/06/2022 which showed EF 50%, less than 10% evidence of infarction, no  induced ischemia, overall low risk study.  Most recent carotid Doppler obtained in 04/22/2023 showed 1 to 39% stenosis in bilateral ICA, no significant carotid artery disease.  Bilateral vertebral artery showed antegrade flow.  He has been followed by Dr. Revonda Standard Chi of pulmonology service due to worsening shortness of breath.  He his last visit was near the end of December and he was given a prednisone taper.  Patient presents today accompanied by wife.  For the past month, he has been noticing episodes of jaw pain.  He says he can feel the symptoms coming on, symptomatic often preceded by 4 to 5 seconds where he feel his heart would stop.  When this feeling resolved, jaw pain starts, and the last about 10 minutes.  He would notice the jaw pain both with activity and at rest.  He denies any obvious chest pain.  He can clearly feel A-fib occasionally with palpitation, however recent symptom feels different from A-fib.  He has a Nutritional therapist, the last episode of A-fib was in December 25.  That was the only episode of A-fib in the recent several months.  His symptom has been increasing in frequency and intensity.  I discussed his case with his cardiologist Dr. Antoine Poche.  His symptom is concerning.  Differential diagnosis including unstable angina versus sinus pause.  We recommended proceed with cardiac catheterization and also ZIO AT monitor.  We will obtain basic metabolic panel and CBC today.  I plan to see the patient back in 3 weeks for reassessment.  ROS:   Patient complains of jaw pain, palpitation, worsening shortness of breath.  He denies any lower extremity edema, orthopnea or PND.  Studies Reviewed: .        Cardiac Studies & Procedures   CARDIAC CATHETERIZATION  CARDIAC CATHETERIZATION 04/25/2020  Narrative Conclusions: 1. Multivessel coronary artery disease, including focal, eccentric 60% ostial LMCA lesion (MLA 5.96 mm^2 by IVUS), serial 40-50% proximal through distal LAD  stenoses, 80% ostial D2 stenosis, 50% proximal and 100% mid LCx stenoses with right-to-left collateral supplying the distal OM branches, and 50% mid RCA disease. 2. Mildly reduced left ventricular systolic function (assessment limited by atrial fibrillation with rapid ventricular response).  LVEF ~ 45-50% with global hypokinesis and normal filling pressure.  Recommendations: 1. Given multivessel coronary artery disease that was significant in the LAD and LCx territories on recent cardiac CTA as well as focal ostial LMCA stenosis that is significant by IVUS criteria, I recommend cardiac surgery consultation for CABG.  The LMCA lesion is treatable from a percutaneous approach.  However, the multisegmental LAD disease involving D2 and CTO of the LCx are less favorable for stenting. 2. Start heparin infusion 2 hours after removal of TR band. 3. Aggressive secondary prevention of coronary artery disease.  Yvonne Kendall, MD [facility]  Findings Coronary Findings Diagnostic  Dominance: Right  Left Main Vessel is large. Ost LM lesion is 60% stenosed. Ultrasound (IVUS) was performed. Cross-sectional area: 6 mm.  Left Anterior Descending Vessel is large. Prox LAD to Mid LAD lesion is 40% stenosed. Mid LAD lesion is 50% stenosed. The lesion is mildly calcified. Mid LAD to Dist LAD lesion is 50% stenosed.  First Diagonal Branch Vessel is large in size.  Second Diagonal Branch Vessel is moderate in size. 2nd Diag lesion is 80% stenosed.  Third Diagonal Branch Vessel is small in size.  Left Circumflex Vessel is moderate in size. Prox Cx lesion is 50% stenosed. Prox Cx to Mid Cx lesion is 100% stenosed. The lesion is chronically occluded with right-to-left collateral flow.  Second Obtuse Marginal Branch Vessel is small in size.  Third Obtuse Marginal Branch Vessel is moderate in size. Collaterals 3rd Mrg filled by collaterals from 3rd RPL.  Right Coronary Artery Vessel is  large. Mid RCA lesion is 50% stenosed.  Right Posterior Descending Artery Vessel is moderate in size.  Right Posterior Atrioventricular Artery Vessel is large in size.  Intervention  No interventions have been documented.   STRESS TESTS  MYOCARDIAL PERFUSION IMAGING 08/06/2022  Narrative   Findings are consistent with infarction. The study is low risk (< 10% infarcted myocardium).   No ST deviation was noted.   Left ventricular function is abnormal. Nuclear stress EF: 50 %. The left ventricular ejection fraction is mildly decreased (45-54%). End diastolic cavity size is mildly enlarged. End systolic cavity size is normal.   Prior study available for comparison from 11/09/2015. No changes compared to prior study.  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 LVEF 50%, this may be within normal range for imaging modality.  ECHOCARDIOGRAM  ECHOCARDIOGRAM LIMITED 05/03/2020  Narrative ECHOCARDIOGRAM LIMITED REPORT    Patient Name:   Guy Morris Date of Exam: 05/03/2020 Medical Rec #:  [id number]      Height:       68.0 in Accession #:    [id number]     Weight:       169.1 lb Date of Birth:  03/06/1951      BSA:  1.903 m Patient Age:    69 years       BP:           103/77 mmHg Patient Gender: M              HR:           101 bpm. Exam Location:  Inpatient  Procedure: Limited Echo  Indications:    Pericardial effusion 423.9 / I31.3  History:        Patient has prior history of Echocardiogram examinations, most recent 04/25/2020. Prior CABG, COPD, Arrythmias:Atrial Fibrillation, Signs/Symptoms:Chest Pain; Risk Factors:Dyslipidemia and Former Smoker.  Sonographer:    Renella Cunas RDCS Referring Phys: [id number] PETER VAN TRIGT  IMPRESSIONS   1. Left ventricular ejection fraction, by estimation, is 50 to 55%. The left ventricle has low normal function. The left ventricle demonstrates regional wall motion abnormalities (see scoring  diagram/findings for description). Left ventricular diastolic function could not be evaluated. There is moderate hypokinesis of the left ventricular, basal-mid inferolateral wall. 2. Right ventricular systolic function is normal. The right ventricular size is normal. Tricuspid regurgitation signal is inadequate for assessing PA pressure. 3. The mitral valve is normal in structure. No evidence of mitral valve regurgitation. No evidence of mitral stenosis. 4. The aortic valve is normal in structure. Aortic valve regurgitation is not visualized. No aortic stenosis is present.  Comparison(s): A prior study was performed on 04/25/2020. No significant change from prior study. Prior images reviewed side by side. The inferolateral wall motion abnormality was also present on the previous study. Postoperative paradoxical septal motion is new.  FINDINGS Left Ventricle: Left ventricular ejection fraction, by estimation, is 50 to 55%. The left ventricle has low normal function. The left ventricle demonstrates regional wall motion abnormalities. Moderate hypokinesis of the left ventricular, basal-mid inferolateral wall. The left ventricular internal cavity size was normal in size. There is no left ventricular hypertrophy. Abnormal (paradoxical) septal motion consistent with post-operative status. Left ventricular diastolic function could not be evaluated. Left ventricular diastolic function could not be evaluated due to atrial fibrillation.  Right Ventricle: The right ventricular size is normal. No increase in right ventricular wall thickness. Right ventricular systolic function is normal. Tricuspid regurgitation signal is inadequate for assessing PA pressure.  Left Atrium: Left atrial size was normal in size.  Right Atrium: Right atrial size was normal in size.  Pericardium: There is no evidence of pericardial effusion.  Mitral Valve: The mitral valve is normal in structure. No evidence of mitral valve  stenosis.  Tricuspid Valve: The tricuspid valve is normal in structure. Tricuspid valve regurgitation is not demonstrated. No evidence of tricuspid stenosis.  Aortic Valve: The aortic valve is normal in structure. Aortic valve regurgitation is not visualized. No aortic stenosis is present.  Pulmonic Valve: The pulmonic valve was normal in structure. Pulmonic valve regurgitation is not visualized. No evidence of pulmonic stenosis.  Aorta: The aortic root is normal in size and structure.  Venous: The inferior vena cava was not well visualized.  IAS/Shunts: No atrial level shunt detected by color flow Doppler.  LEFT VENTRICLE PLAX 2D LVIDd:         5.20 cm LVIDs:         4.80 cm LV PW:         0.90 cm LV IVS:        0.90 cm LVOT diam:     2.20 cm LV SV:         38 LV SV Index:  20 LVOT Area:     3.80 cm   LEFT ATRIUM         Index LA diam:    3.30 cm 1.73 cm/m AORTIC VALVE LVOT Vmax:   62.70 cm/s LVOT Vmean:  47.000 cm/s LVOT VTI:    0.101 m  AORTA Ao Root diam: 3.30 cm   SHUNTS Systemic VTI:  0.10 m Systemic Diam: 2.20 cm  Thurmon Fair MD Electronically signed by Thurmon Fair MD Signature Date/Time: 05/03/2020/2:00:42 PM    Final  TEE  ECHO INTRAOPERATIVE TEE 04/28/2020  Narrative *INTRAOPERATIVE TRANSESOPHAGEAL REPORT *    Patient Name:   Guy Morris Date of Exam: 04/28/2020 Medical Rec #:  [id number]      Height:       68.0 in Accession #:    [id number]     Weight:       172.4 lb Date of Birth:  Jul 01, 1950      BSA:          1.92 m Patient Age:    69 years       BP:           135/71 mmHg Patient Gender: M              HR:           53 bpm. Exam Location:  Inpatient  Transesophogeal exam was perform intraoperatively during surgical procedure. Patient was closely monitored under general anesthesia during the entirety of examination.  Indications:     I25.110 Atherosclerotic heart disease of native coronary artery with unstable angina  pectoris; I48.91* Unspeicified atrial fibrillation Performing Phys: [id number] PETER VAN TRIGT Diagnosing Phys: Leslye Peer MD  Complications: No known complications during this procedure. POST-OP IMPRESSIONS - Left Ventricle: The left ventricle is unchanged from pre-bypass. - Right Ventricle: The right ventricle appears unchanged from pre-bypass. - Aorta: The aorta appears unchanged from pre-bypass. - Left Atrium: The left atrium appears unchanged from pre-bypass. - Left Atrial Appendage: The left atrial appendage appears unchanged from pre-bypass. - Aortic Valve: The aortic valve appears unchanged from pre-bypass. - Mitral Valve: The mitral valve appears unchanged from pre-bypass. - Tricuspid Valve: The tricuspid valve appears unchanged from pre-bypass. - Interatrial Septum: The interatrial septum appears unchanged from pre-bypass. - Interventricular Septum: The interventricular septum appears unchanged from pre-bypass. - Pericardium: The pericardium appears unchanged from pre-bypass.  PRE-OP FINDINGS Left Ventricle: The left ventricle has low normal systolic function, with an ejection fraction of 50-55%. The cavity size was normal. There is no increase in left ventricular wall thickness.  Right Ventricle: The right ventricle has normal systolic function. The cavity was normal. There is no increase in right ventricular wall thickness.  Left Atrium: Left atrial size was normal in size. The left atrial appendage is well visualized and there is no evidence of thrombus present.  Right Atrium: Right atrial size was normal in size.  Interatrial Septum: The interatrial septum was not well visualized.  Pericardium: There is no evidence of pericardial effusion.  Mitral Valve: The mitral valve is normal in structure. Mitral valve regurgitation is trivial by color flow Doppler. There is No evidence of mitral stenosis.  Tricuspid Valve: The tricuspid valve was normal in structure. Tricuspid  valve regurgitation was not visualized by color flow Doppler.  Aortic Valve: The aortic valve is tricuspid Aortic valve regurgitation was not visualized by color flow Doppler. There is no stenosis of the aortic valve.  Pulmonic Valve: The pulmonic valve was not well visualized.  Pulmonic valve regurgitation was not assessed by color flow Doppler.   Aorta: The aortic arch, ascending aorta and aortic root are normal in size and structure.   Leslye Peer MD Electronically signed by Leslye Peer MD Signature Date/Time: 04/28/2020/2:20:25 PM    Final   CT SCANS  CT CORONARY FRACTIONAL FLOW RESERVE DATA PREP 04/18/2020  Narrative EXAM: CT FFR ANALYSIS  CLINICAL DATA:  73 year old male with abnormal coronary CTA and chest pain.  FINDINGS: FFRct analysis was performed on the original cardiac CT angiogram dataset. Diagrammatic representation of the FFRct analysis is provided in a separate PDF document in PACS. This dictation was created using the PDF document and an interactive 3D model of the results. 3D model is not available in the EMR/PACS. Normal FFR range is >0.80.  1. Left Main: 0.96.  2. LAD: Proximal: 0.96, mid: 0.81, distal: 0.69. 3. LCX: Occluded in the mid portion. 4. RCA: Proximal: 0.95, distal: 0.85.  IMPRESSION: 1. CT FFR analysis showed severe stenosis in the mid LAD and in the mid portion of a small lumen non-dominant LCX artery. A cardiac catheterization is recommended.   Electronically Signed By: Tobias Alexander On: 04/19/2020 11:50   CT CORONARY MORPH W/CTA COR W/SCORE 04/18/2020  Addendum 04/18/2020  2:20 PM ADDENDUM REPORT: 04/18/2020 14:18  CLINICAL DATA:  73 year old male with h/o dyslipidemia, atrial fibrillation and chest pain.  EXAM: Cardiac/Coronary  CTA  TECHNIQUE: The patient was scanned on a [employer].  FINDINGS: A 100 kV prospective scan was triggered in the descending thoracic aorta at 111 HU's. Axial non-contrast  3 mm slices were carried out through the heart. The data set was analyzed on a dedicated work station and scored using the Agatson method. Gantry rotation speed was 250 msecs and collimation was .6 mm. 100 mg of PO Metoprolol and 0.8 mg of sl NTG was given. The 3D data set was reconstructed in 5% intervals of the 67-82 % of the R-R cycle. Diastolic phases were analyzed on a dedicated work station using MPR, MIP and VRT modes. The patient received 80 cc of contrast.  Aorta: Normal size. Mild diffuse atherosclerotic plaque and calcifications. No dissection.  Aortic Valve:  Trileaflet. Trivial calcifications.  Coronary Arteries:  Normal coronary origin.  Right dominance.  RCA is a large dominant artery that gives rise to PDA and PLA. There is mild diffuse predominantly calcified plaque in the proximal portion with stenosis 25-49%. Mid RCA has diffuse, mixed, predominantly calcified plaque with stenosis 50-69%. Distal RCA/PDA/PLA has minimal diffuse plaque.  Left main is a large artery that gives rise to LAD and LCX arteries. Ostial left main has a mild non-calcified plaque with stenosis 25-49%.  LAD is a large vessel that gives rise to two diagonal arteries. Proximal LAD has a moderate diffuse predominantly calcified plaque with stenosis 50-69% stenosis. Mid LAD has moderate to severe mixed diffuse plaque with a focal stenosis suspicious for > 70% stenosis.  D1 is a small artery with only minimal plaque.  D2 is occluded.  LCX is a small lumen non-dominant artery that has severe calcified plaque in the mid portion causing occlusion.  Other findings:  Normal pulmonary vein drainage into the left atrium.  Normal left atrial appendage without a thrombus.  Normal size of the pulmonary artery.  IMPRESSION: 1. Coronary calcium score of 564. This was 69 percentile for age and sex matched control.  2. Normal coronary origin with right dominance.  3. CAD-RADS 4 with diffuse  moderate stenoses and  a severe focal mixed plaque in the mid LAD suspicious for stenosis > 70%. Occluded small lumen non-dominant LCX artery. consider symptom-guided anti-ischemic pharmacotherapy as well as risk factor modification per guideline directed care. Additional analysis with CT FFR will be submitted.   Electronically Signed By: Tobias Alexander On: 04/18/2020 14:18  Narrative EXAM: OVER-READ INTERPRETATION  CT CHEST  The following report is an over-read performed by radiologist Dr. Trudie Reed of [facility] on 04/18/2020. This over-read does not include interpretation of cardiac or coronary anatomy or pathology. The coronary calcium score/coronary CTA interpretation by the cardiologist is attached.  COMPARISON:  Chest CT 07/21/2014.  FINDINGS: Aortic atherosclerosis. Pleural plaques are noted in the thorax bilaterally (right greater than left) with some calcified pleural plaques in the right hemithorax. Within the visualized portions of the thorax there are no suspicious appearing pulmonary nodules or masses, there is no acute consolidative airspace disease, no pleural effusions, no pneumothorax and no lymphadenopathy. Visualized portions of the upper abdomen are unremarkable. There are no aggressive appearing lytic or blastic lesions noted in the visualized portions of the skeleton.  IMPRESSION: 1. Aortic atherosclerosis. 2. Bilateral pleural plaques, many of which in the right hemithorax are calcified. 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  Electronically Signed: By: Trudie Reed M.D. On: 04/18/2020 09:26          Risk Assessment/Calculations:    CHA2DS2-VASc Score = 3    This indicates a 3.2% annual risk of stroke. The patient's score is based upon: CHF History: 0 HTN History: 1 Diabetes History: 0 Stroke History: 0 Vascular Disease History: 1 Age Score: 1 Gender Score: 0            Physical  Exam:   VS:  BP 126/74 (BP Location: Left Arm, Patient Position: Sitting, Cuff Size: Normal)   Pulse 63   Ht 5\' 8"  (1.727 m)   Wt 170 lb 3.2 oz (77.2 kg)   SpO2 98%   BMI 25.88 kg/m    Wt Readings from Last 3 Encounters:  07/03/23 170 lb 3.2 oz (77.2 kg)  05/31/23 171 lb (77.6 kg)  04/02/23 170 lb (77.1 kg)    GEN: Well nourished, well developed in no acute distress NECK: No JVD; No carotid bruits CARDIAC: RRR, no murmurs, rubs, gallops RESPIRATORY:  Clear to auscultation without rales, wheezing or rhonchi  ABDOMEN: Soft, non-tender, non-distended EXTREMITIES:  No edema; No deformity   ASSESSMENT AND PLAN: .    Angina Recurrent jaw pain for the past month, reminiscent of previous angina. No current chest pain. Symptoms have been progressively worsening. Prior CABG in 2021. Intolerant of statins. -Prior to the occurrence of jaw pain, patient would complain of feeling his heart stop for a few seconds.  Will obtain ZIO monitor to make sure he is not having significant pauses. -Order cardiac catheterization for definitive ischemic workup. -Order CBC and basic metabolic panel to assess for anemia and kidney function prior to catheterization.  Atrial Fibrillation History of AFib with occasional episodes, approximately 2-3 times per year. Not currently on anticoagulation therapy. Prior left atrial appendage clipping, MACE procedure, and RF ablation. -Order 2-week heart monitor to assess for prolonged pauses correlating with symptoms.  Peripheral Artery Disease No current claudication symptoms or evidence of critical limb ischemia. -Known history of left SFA occlusion -Continue walking program.  Hypertension: Continue on current therapy  Hyperlipidemia Intolerant of statins and unable to afford PCSK9 inhibitors. -No change in management.   Follow-up in  3 weeks post cardiac catheterization.    Informed Consent   Shared Decision Making/Informed Consent The risks [stroke (1 in  1000), death (1 in 1000), kidney failure [usually temporary] (1 in 500), bleeding (1 in 200), allergic reaction [possibly serious] (1 in 200)], benefits (diagnostic support and management of coronary artery disease) and alternatives of a cardiac catheterization were discussed in detail with Mr. Blaz and he is willing to proceed.     Dispo: Follow-up in 3 weeks after cardioversion  Signed, Azalee Course, PA

## 2023-07-03 NOTE — H&P (View-Only) (Signed)
Cardiology Office Note:  .   Date:  07/03/2023  ID:  Guy Morris, DOB 02/04/1951, MRN [id number] PCP: Corwin Levins, MD  [facility] Providers Cardiologist:  Rollene Rotunda, MD     History of Present Illness: .   Guy Morris is a 73 y.o. male with past medical history of CAD s/p CABG, PAD with known occluded L SFA, COPD, former smoker, atrial fibrillation, HTN and HLD.  He converted spontaneously when he had A-fib.  He had jaw pain in October 2021 and subsequently found to have three-vessel CAD.  He ultimately underwent four-vessel CABG with LIMA-LAD, SVG-OM, SVG-PDA, SVG-diagonal, clipping of the atrial appendage, MAZE procedure of left atrium with bilateral pulmonary vein isolation with RF ablation by Dr. Donata Clay on 04/28/2020.  Echocardiogram obtained on 05/03/2020 showed EF 50 to 55%, moderate hypokinesis of the LV, basal mid inferolateral wall, normal RV, no significant valve issue.  Due to prior history of A-fib, he was prophylactically placed on amiodarone during perioperative period.  Postprocedure, he converted in and out of atrial fibrillation with elevated heart rate.  Lopressor dose was titrated.  He has not been able to tolerate higher dose of statin and cannot afford other therapies.  Previous noninvasive study has revealed evidence of SFA occlusion on the left side, he was seen by Dr. Arbie Cookey of vascular surgery on 12/06/2021, given lack of any claudication symptom or evidence of critical limb ischemia, it was recommended to continue walking program.  He is not on anticoagulation therapy.  As part of his preoperative evaluation prior to spinal fusion surgery, he was seen by Dr. Flora Lipps on 07/30/2022 at which time he was having intermittent A-fib and also chest pain.  Chest pain was nonexertional and only last seconds at a time.  He says he has A-fib episode about 2-3 times a year.  He underwent Myoview on 08/06/2022 which showed EF 50%, less than 10% evidence of infarction, no  induced ischemia, overall low risk study.  Most recent carotid Doppler obtained in 04/22/2023 showed 1 to 39% stenosis in bilateral ICA, no significant carotid artery disease.  Bilateral vertebral artery showed antegrade flow.  He has been followed by Dr. Revonda Standard Chi of pulmonology service due to worsening shortness of breath.  He his last visit was near the end of December and he was given a prednisone taper.  Patient presents today accompanied by wife.  For the past month, he has been noticing episodes of jaw pain.  He says he can feel the symptoms coming on, symptomatic often preceded by 4 to 5 seconds where he feel his heart would stop.  When this feeling resolved, jaw pain starts, and the last about 10 minutes.  He would notice the jaw pain both with activity and at rest.  He denies any obvious chest pain.  He can clearly feel A-fib occasionally with palpitation, however recent symptom feels different from A-fib.  He has a Nutritional therapist, the last episode of A-fib was in December 25.  That was the only episode of A-fib in the recent several months.  His symptom has been increasing in frequency and intensity.  I discussed his case with his cardiologist Dr. Antoine Poche.  His symptom is concerning.  Differential diagnosis including unstable angina versus sinus pause.  We recommended proceed with cardiac catheterization and also ZIO AT monitor.  We will obtain basic metabolic panel and CBC today.  I plan to see the patient back in 3 weeks for reassessment.  ROS:   Patient complains of jaw pain, palpitation, worsening shortness of breath.  He denies any lower extremity edema, orthopnea or PND.  Studies Reviewed: .        Cardiac Studies & Procedures   CARDIAC CATHETERIZATION  CARDIAC CATHETERIZATION 04/25/2020  Narrative Conclusions: 1. Multivessel coronary artery disease, including focal, eccentric 60% ostial LMCA lesion (MLA 5.96 mm^2 by IVUS), serial 40-50% proximal through distal LAD  stenoses, 80% ostial D2 stenosis, 50% proximal and 100% mid LCx stenoses with right-to-left collateral supplying the distal OM branches, and 50% mid RCA disease. 2. Mildly reduced left ventricular systolic function (assessment limited by atrial fibrillation with rapid ventricular response).  LVEF ~ 45-50% with global hypokinesis and normal filling pressure.  Recommendations: 1. Given multivessel coronary artery disease that was significant in the LAD and LCx territories on recent cardiac CTA as well as focal ostial LMCA stenosis that is significant by IVUS criteria, I recommend cardiac surgery consultation for CABG.  The LMCA lesion is treatable from a percutaneous approach.  However, the multisegmental LAD disease involving D2 and CTO of the LCx are less favorable for stenting. 2. Start heparin infusion 2 hours after removal of TR band. 3. Aggressive secondary prevention of coronary artery disease.  Yvonne Kendall, MD [facility]  Findings Coronary Findings Diagnostic  Dominance: Right  Left Main Vessel is large. Ost LM lesion is 60% stenosed. Ultrasound (IVUS) was performed. Cross-sectional area: 6 mm.  Left Anterior Descending Vessel is large. Prox LAD to Mid LAD lesion is 40% stenosed. Mid LAD lesion is 50% stenosed. The lesion is mildly calcified. Mid LAD to Dist LAD lesion is 50% stenosed.  First Diagonal Branch Vessel is large in size.  Second Diagonal Branch Vessel is moderate in size. 2nd Diag lesion is 80% stenosed.  Third Diagonal Branch Vessel is small in size.  Left Circumflex Vessel is moderate in size. Prox Cx lesion is 50% stenosed. Prox Cx to Mid Cx lesion is 100% stenosed. The lesion is chronically occluded with right-to-left collateral flow.  Second Obtuse Marginal Branch Vessel is small in size.  Third Obtuse Marginal Branch Vessel is moderate in size. Collaterals 3rd Mrg filled by collaterals from 3rd RPL.  Right Coronary Artery Vessel is  large. Mid RCA lesion is 50% stenosed.  Right Posterior Descending Artery Vessel is moderate in size.  Right Posterior Atrioventricular Artery Vessel is large in size.  Intervention  No interventions have been documented.   STRESS TESTS  MYOCARDIAL PERFUSION IMAGING 08/06/2022  Narrative   Findings are consistent with infarction. The study is low risk (< 10% infarcted myocardium).   No ST deviation was noted.   Left ventricular function is abnormal. Nuclear stress EF: 50 %. The left ventricular ejection fraction is mildly decreased (45-54%). End diastolic cavity size is mildly enlarged. End systolic cavity size is normal.   Prior study available for comparison from 11/09/2015. No changes compared to prior study.  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 LVEF 50%, this may be within normal range for imaging modality.  ECHOCARDIOGRAM  ECHOCARDIOGRAM LIMITED 05/03/2020  Narrative ECHOCARDIOGRAM LIMITED REPORT    Patient Name:   Guy Morris Date of Exam: 05/03/2020 Medical Rec #:  [id number]      Height:       68.0 in Accession #:    [id number]     Weight:       169.1 lb Date of Birth:  [birth year]      BSA:  1.903 m Patient Age:    69 years       BP:           103/77 mmHg Patient Gender: M              HR:           101 bpm. Exam Location:  Inpatient  Procedure: Limited Echo  Indications:    Pericardial effusion 423.9 / I31.3  History:        Patient has prior history of Echocardiogram examinations, most recent 04/25/2020. Prior CABG, COPD, Arrythmias:Atrial Fibrillation, Signs/Symptoms:Chest Pain; Risk Factors:Dyslipidemia and Former Smoker.  Sonographer:    Renella Cunas RDCS Referring Phys: [id number] PETER VAN TRIGT  IMPRESSIONS   1. Left ventricular ejection fraction, by estimation, is 50 to 55%. The left ventricle has low normal function. The left ventricle demonstrates regional wall motion abnormalities (see scoring  diagram/findings for description). Left ventricular diastolic function could not be evaluated. There is moderate hypokinesis of the left ventricular, basal-mid inferolateral wall. 2. Right ventricular systolic function is normal. The right ventricular size is normal. Tricuspid regurgitation signal is inadequate for assessing PA pressure. 3. The mitral valve is normal in structure. No evidence of mitral valve regurgitation. No evidence of mitral stenosis. 4. The aortic valve is normal in structure. Aortic valve regurgitation is not visualized. No aortic stenosis is present.  Comparison(s): A prior study was performed on 04/25/2020. No significant change from prior study. Prior images reviewed side by side. The inferolateral wall motion abnormality was also present on the previous study. Postoperative paradoxical septal motion is new.  FINDINGS Left Ventricle: Left ventricular ejection fraction, by estimation, is 50 to 55%. The left ventricle has low normal function. The left ventricle demonstrates regional wall motion abnormalities. Moderate hypokinesis of the left ventricular, basal-mid inferolateral wall. The left ventricular internal cavity size was normal in size. There is no left ventricular hypertrophy. Abnormal (paradoxical) septal motion consistent with post-operative status. Left ventricular diastolic function could not be evaluated. Left ventricular diastolic function could not be evaluated due to atrial fibrillation.  Right Ventricle: The right ventricular size is normal. No increase in right ventricular wall thickness. Right ventricular systolic function is normal. Tricuspid regurgitation signal is inadequate for assessing PA pressure.  Left Atrium: Left atrial size was normal in size.  Right Atrium: Right atrial size was normal in size.  Pericardium: There is no evidence of pericardial effusion.  Mitral Valve: The mitral valve is normal in structure. No evidence of mitral valve  stenosis.  Tricuspid Valve: The tricuspid valve is normal in structure. Tricuspid valve regurgitation is not demonstrated. No evidence of tricuspid stenosis.  Aortic Valve: The aortic valve is normal in structure. Aortic valve regurgitation is not visualized. No aortic stenosis is present.  Pulmonic Valve: The pulmonic valve was normal in structure. Pulmonic valve regurgitation is not visualized. No evidence of pulmonic stenosis.  Aorta: The aortic root is normal in size and structure.  Venous: The inferior vena cava was not well visualized.  IAS/Shunts: No atrial level shunt detected by color flow Doppler.  LEFT VENTRICLE PLAX 2D LVIDd:         5.20 cm LVIDs:         4.80 cm LV PW:         0.90 cm LV IVS:        0.90 cm LVOT diam:     2.20 cm LV SV:         38 LV SV Index:  20 LVOT Area:     3.80 cm   LEFT ATRIUM         Index LA diam:    3.30 cm 1.73 cm/m AORTIC VALVE LVOT Vmax:   62.70 cm/s LVOT Vmean:  47.000 cm/s LVOT VTI:    0.101 m  AORTA Ao Root diam: 3.30 cm   SHUNTS Systemic VTI:  0.10 m Systemic Diam: 2.20 cm  Thurmon Fair MD Electronically signed by Thurmon Fair MD Signature Date/Time: 05/03/2020/2:00:42 PM    Final  TEE  ECHO INTRAOPERATIVE TEE 04/28/2020  Narrative *INTRAOPERATIVE TRANSESOPHAGEAL REPORT *    Patient Name:   Guy Morris Date of Exam: 04/28/2020 Medical Rec #:  [id number]      Height:       68.0 in Accession #:    [id number]     Weight:       172.4 lb Date of Birth:  12/21/1950      BSA:          1.92 m Patient Age:    69 years       BP:           135/71 mmHg Patient Gender: M              HR:           53 bpm. Exam Location:  Inpatient  Transesophogeal exam was perform intraoperatively during surgical procedure. Patient was closely monitored under general anesthesia during the entirety of examination.  Indications:     I25.110 Atherosclerotic heart disease of native coronary artery with unstable angina  pectoris; I48.91* Unspeicified atrial fibrillation Performing Phys: [id number] PETER VAN TRIGT Diagnosing Phys: Guy Peer MD  Complications: No known complications during this procedure. POST-OP IMPRESSIONS - Left Ventricle: The left ventricle is unchanged from pre-bypass. - Right Ventricle: The right ventricle appears unchanged from pre-bypass. - Aorta: The aorta appears unchanged from pre-bypass. - Left Atrium: The left atrium appears unchanged from pre-bypass. - Left Atrial Appendage: The left atrial appendage appears unchanged from pre-bypass. - Aortic Valve: The aortic valve appears unchanged from pre-bypass. - Mitral Valve: The mitral valve appears unchanged from pre-bypass. - Tricuspid Valve: The tricuspid valve appears unchanged from pre-bypass. - Interatrial Septum: The interatrial septum appears unchanged from pre-bypass. - Interventricular Septum: The interventricular septum appears unchanged from pre-bypass. - Pericardium: The pericardium appears unchanged from pre-bypass.  PRE-OP FINDINGS Left Ventricle: The left ventricle has low normal systolic function, with an ejection fraction of 50-55%. The cavity size was normal. There is no increase in left ventricular wall thickness.  Right Ventricle: The right ventricle has normal systolic function. The cavity was normal. There is no increase in right ventricular wall thickness.  Left Atrium: Left atrial size was normal in size. The left atrial appendage is well visualized and there is no evidence of thrombus present.  Right Atrium: Right atrial size was normal in size.  Interatrial Septum: The interatrial septum was not well visualized.  Pericardium: There is no evidence of pericardial effusion.  Mitral Valve: The mitral valve is normal in structure. Mitral valve regurgitation is trivial by color flow Doppler. There is No evidence of mitral stenosis.  Tricuspid Valve: The tricuspid valve was normal in structure. Tricuspid  valve regurgitation was not visualized by color flow Doppler.  Aortic Valve: The aortic valve is tricuspid Aortic valve regurgitation was not visualized by color flow Doppler. There is no stenosis of the aortic valve.  Pulmonic Valve: The pulmonic valve was not well visualized.  Pulmonic valve regurgitation was not assessed by color flow Doppler.   Aorta: The aortic arch, ascending aorta and aortic root are normal in size and structure.   Guy Peer MD Electronically signed by Guy Peer MD Signature Date/Time: 04/28/2020/2:20:25 PM    Final   CT SCANS  CT CORONARY FRACTIONAL FLOW RESERVE DATA PREP 04/18/2020  Narrative EXAM: CT FFR ANALYSIS  CLINICAL DATA:  73 year old male with abnormal coronary CTA and chest pain.  FINDINGS: FFRct analysis was performed on the original cardiac CT angiogram dataset. Diagrammatic representation of the FFRct analysis is provided in a separate PDF document in PACS. This dictation was created using the PDF document and an interactive 3D model of the results. 3D model is not available in the EMR/PACS. Normal FFR range is >0.80.  1. Left Main: 0.96.  2. LAD: Proximal: 0.96, mid: 0.81, distal: 0.69. 3. LCX: Occluded in the mid portion. 4. RCA: Proximal: 0.95, distal: 0.85.  IMPRESSION: 1. CT FFR analysis showed severe stenosis in the mid LAD and in the mid portion of a small lumen non-dominant LCX artery. A cardiac catheterization is recommended.   Electronically Signed By: Guy Morris On: 04/19/2020 11:50   CT CORONARY MORPH W/CTA COR W/SCORE 04/18/2020  Addendum 04/18/2020  2:20 PM ADDENDUM REPORT: 04/18/2020 14:18  CLINICAL DATA:  73 year old male with h/o dyslipidemia, atrial fibrillation and chest pain.  EXAM: Cardiac/Coronary  CTA  TECHNIQUE: The patient was scanned on a [employer].  FINDINGS: A 100 kV prospective scan was triggered in the descending thoracic aorta at 111 HU's. Axial non-contrast  3 mm slices were carried out through the heart. The data set was analyzed on a dedicated work station and scored using the Agatson method. Gantry rotation speed was 250 msecs and collimation was .6 mm. 100 mg of PO Metoprolol and 0.8 mg of sl NTG was given. The 3D data set was reconstructed in 5% intervals of the 67-82 % of the R-R cycle. Diastolic phases were analyzed on a dedicated work station using MPR, MIP and VRT modes. The patient received 80 cc of contrast.  Aorta: Normal size. Mild diffuse atherosclerotic plaque and calcifications. No dissection.  Aortic Valve:  Trileaflet. Trivial calcifications.  Coronary Arteries:  Normal coronary origin.  Right dominance.  RCA is a large dominant artery that gives rise to PDA and PLA. There is mild diffuse predominantly calcified plaque in the proximal portion with stenosis 25-49%. Mid RCA has diffuse, mixed, predominantly calcified plaque with stenosis 50-69%. Distal RCA/PDA/PLA has minimal diffuse plaque.  Left main is a large artery that gives rise to LAD and LCX arteries. Ostial left main has a mild non-calcified plaque with stenosis 25-49%.  LAD is a large vessel that gives rise to two diagonal arteries. Proximal LAD has a moderate diffuse predominantly calcified plaque with stenosis 50-69% stenosis. Mid LAD has moderate to severe mixed diffuse plaque with a focal stenosis suspicious for > 70% stenosis.  D1 is a small artery with only minimal plaque.  D2 is occluded.  LCX is a small lumen non-dominant artery that has severe calcified plaque in the mid portion causing occlusion.  Other findings:  Normal pulmonary vein drainage into the left atrium.  Normal left atrial appendage without a thrombus.  Normal size of the pulmonary artery.  IMPRESSION: 1. Coronary calcium score of 564. This was 61 percentile for age and sex matched control.  2. Normal coronary origin with right dominance.  3. CAD-RADS 4 with diffuse  moderate stenoses and  a severe focal mixed plaque in the mid LAD suspicious for stenosis > 70%. Occluded small lumen non-dominant LCX artery. consider symptom-guided anti-ischemic pharmacotherapy as well as risk factor modification per guideline directed care. Additional analysis with CT FFR will be submitted.   Electronically Signed By: Guy Morris On: 04/18/2020 14:18  Narrative EXAM: OVER-READ INTERPRETATION  CT CHEST  The following report is an over-read performed by radiologist Dr. Trudie Reed of [facility] on 04/18/2020. This over-read does not include interpretation of cardiac or coronary anatomy or pathology. The coronary calcium score/coronary CTA interpretation by the cardiologist is attached.  COMPARISON:  Chest CT 07/21/2014.  FINDINGS: Aortic atherosclerosis. Pleural plaques are noted in the thorax bilaterally (right greater than left) with some calcified pleural plaques in the right hemithorax. Within the visualized portions of the thorax there are no suspicious appearing pulmonary nodules or masses, there is no acute consolidative airspace disease, no pleural effusions, no pneumothorax and no lymphadenopathy. Visualized portions of the upper abdomen are unremarkable. There are no aggressive appearing lytic or blastic lesions noted in the visualized portions of the skeleton.  IMPRESSION: 1. Aortic atherosclerosis. 2. Bilateral pleural plaques, many of which in the right hemithorax are calcified. 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  Electronically Signed: By: Trudie Reed M.D. On: 04/18/2020 09:26          Risk Assessment/Calculations:    CHA2DS2-VASc Score = 3    This indicates a 3.2% annual risk of stroke. The patient's score is based upon: CHF History: 0 HTN History: 1 Diabetes History: 0 Stroke History: 0 Vascular Disease History: 1 Age Score: 1 Gender Score: 0            Physical  Exam:   VS:  BP 126/74 (BP Location: Left Arm, Patient Position: Sitting, Cuff Size: Normal)   Pulse 63   Ht 5\' 8"  (1.727 m)   Wt 170 lb 3.2 oz (77.2 kg)   SpO2 98%   BMI 25.88 kg/m    Wt Readings from Last 3 Encounters:  07/03/23 170 lb 3.2 oz (77.2 kg)  05/31/23 171 lb (77.6 kg)  04/02/23 170 lb (77.1 kg)    GEN: Well nourished, well developed in no acute distress NECK: No JVD; No carotid bruits CARDIAC: RRR, no murmurs, rubs, gallops RESPIRATORY:  Clear to auscultation without rales, wheezing or rhonchi  ABDOMEN: Soft, non-tender, non-distended EXTREMITIES:  No edema; No deformity   ASSESSMENT AND PLAN: .    Angina Recurrent jaw pain for the past month, reminiscent of previous angina. No current chest pain. Symptoms have been progressively worsening. Prior CABG in 2021. Intolerant of statins. -Prior to the occurrence of jaw pain, patient would complain of feeling his heart stop for a few seconds.  Will obtain ZIO monitor to make sure he is not having significant pauses. -Order cardiac catheterization for definitive ischemic workup. -Order CBC and basic metabolic panel to assess for anemia and kidney function prior to catheterization.  Atrial Fibrillation History of AFib with occasional episodes, approximately 2-3 times per year. Not currently on anticoagulation therapy. Prior left atrial appendage clipping, MACE procedure, and RF ablation. -Order 2-week heart monitor to assess for prolonged pauses correlating with symptoms.  Peripheral Artery Disease No current claudication symptoms or evidence of critical limb ischemia. -Known history of left SFA occlusion -Continue walking program.  Hypertension: Continue on current therapy  Hyperlipidemia Intolerant of statins and unable to afford PCSK9 inhibitors. -No change in management.   Follow-up in  3 weeks post cardiac catheterization.    Informed Consent   Shared Decision Making/Informed Consent The risks [stroke (1 in  1000), death (1 in 1000), kidney failure [usually temporary] (1 in 500), bleeding (1 in 200), allergic reaction [possibly serious] (1 in 200)], benefits (diagnostic support and management of coronary artery disease) and alternatives of a cardiac catheterization were discussed in detail with Mr. Morais and he is willing to proceed.     Dispo: Follow-up in 3 weeks after cardioversion  Signed, Azalee Course, PA

## 2023-07-03 NOTE — Progress Notes (Unsigned)
Enrolled patient for a 14 day Zio AT monitor to be mailed to patients home  Hochrein to read

## 2023-07-04 LAB — CBC
Hematocrit: 47.7 % (ref 37.5–51.0)
Hemoglobin: 15.7 g/dL (ref 13.0–17.7)
MCH: 30.3 pg (ref 26.6–33.0)
MCHC: 32.9 g/dL (ref 31.5–35.7)
MCV: 92 fL (ref 79–97)
Platelets: 294 10*3/uL (ref 150–450)
RBC: 5.18 x10E6/uL (ref 4.14–5.80)
RDW: 12.3 % (ref 11.6–15.4)
WBC: 6.5 10*3/uL (ref 3.4–10.8)

## 2023-07-04 LAB — BASIC METABOLIC PANEL
BUN/Creatinine Ratio: 12 (ref 10–24)
BUN: 10 mg/dL (ref 8–27)
CO2: 23 mmol/L (ref 20–29)
Calcium: 10 mg/dL (ref 8.6–10.2)
Chloride: 103 mmol/L (ref 96–106)
Creatinine, Ser: 0.83 mg/dL (ref 0.76–1.27)
Glucose: 84 mg/dL (ref 70–99)
Potassium: 4.9 mmol/L (ref 3.5–5.2)
Sodium: 140 mmol/L (ref 134–144)
eGFR: 94 mL/min/{1.73_m2} (ref >59)

## 2023-07-05 ENCOUNTER — Other Ambulatory Visit: Payer: Self-pay

## 2023-07-05 ENCOUNTER — Encounter (HOSPITAL_COMMUNITY)
Admission: RE | Disposition: A | Discharge status: Home / Self Care | Payer: Self-pay | Source: Home / Self Care | Attending: Cardiology

## 2023-07-05 ENCOUNTER — Ambulatory Visit (HOSPITAL_COMMUNITY)
Admission: RE | Admit: 2023-07-05 | Discharge: 2023-07-05 | Disposition: A | Discharge status: Home / Self Care | Payer: Medicare HMO | Attending: Cardiology | Admitting: Cardiology

## 2023-07-05 DIAGNOSIS — I10 Essential (primary) hypertension: Secondary | ICD-10-CM | POA: Insufficient documentation

## 2023-07-05 DIAGNOSIS — I209 Angina pectoris, unspecified: Secondary | ICD-10-CM | POA: Diagnosis not present

## 2023-07-05 DIAGNOSIS — E1151 Type 2 diabetes mellitus with diabetic peripheral angiopathy without gangrene: Secondary | ICD-10-CM | POA: Insufficient documentation

## 2023-07-05 DIAGNOSIS — I25810 Atherosclerosis of coronary artery bypass graft(s) without angina pectoris: Secondary | ICD-10-CM | POA: Diagnosis not present

## 2023-07-05 DIAGNOSIS — Z951 Presence of aortocoronary bypass graft: Secondary | ICD-10-CM | POA: Insufficient documentation

## 2023-07-05 DIAGNOSIS — I480 Paroxysmal atrial fibrillation: Secondary | ICD-10-CM | POA: Insufficient documentation

## 2023-07-05 DIAGNOSIS — I2510 Atherosclerotic heart disease of native coronary artery without angina pectoris: Secondary | ICD-10-CM | POA: Diagnosis not present

## 2023-07-05 DIAGNOSIS — E785 Hyperlipidemia, unspecified: Secondary | ICD-10-CM | POA: Insufficient documentation

## 2023-07-05 DIAGNOSIS — I2584 Coronary atherosclerosis due to calcified coronary lesion: Secondary | ICD-10-CM | POA: Insufficient documentation

## 2023-07-05 DIAGNOSIS — I25110 Atherosclerotic heart disease of native coronary artery with unstable angina pectoris: Secondary | ICD-10-CM | POA: Diagnosis not present

## 2023-07-05 HISTORY — PX: LEFT HEART CATH AND CORS/GRAFTS ANGIOGRAPHY: CATH118250

## 2023-07-05 SURGERY — LEFT HEART CATH AND CORS/GRAFTS ANGIOGRAPHY
Anesthesia: LOCAL

## 2023-07-05 MED ORDER — HEPARIN (PORCINE) IN NACL 1000-0.9 UT/500ML-% IV SOLN
INTRAVENOUS | Status: DC | PRN
  Administered 2023-07-05 (×2): 500 mL

## 2023-07-05 MED ORDER — FENTANYL CITRATE (PF) 100 MCG/2ML IJ SOLN
INTRAMUSCULAR | Status: AC
  Filled 2023-07-05: qty 2

## 2023-07-05 MED ORDER — LIDOCAINE HCL (PF) 1 % IJ SOLN
INTRAMUSCULAR | Status: AC
  Filled 2023-07-05: qty 30

## 2023-07-05 MED ORDER — HEPARIN SODIUM (PORCINE) 1000 UNIT/ML IJ SOLN
INTRAMUSCULAR | Status: AC
  Filled 2023-07-05: qty 10

## 2023-07-05 MED ORDER — MIDAZOLAM HCL 2 MG/2ML IJ SOLN
INTRAMUSCULAR | Status: DC | PRN
  Administered 2023-07-05 (×2): 1 mg via INTRAVENOUS

## 2023-07-05 MED ORDER — SODIUM CHLORIDE 0.9 % WEIGHT BASED INFUSION: 3.0000 mL/kg/h | INTRAVENOUS | Status: AC

## 2023-07-05 MED ORDER — ONDANSETRON HCL 4 MG/2ML IJ SOLN: 4.0000 mg | Freq: Four times a day (QID) | INTRAMUSCULAR | Status: DC | PRN

## 2023-07-05 MED ORDER — FENTANYL CITRATE (PF) 100 MCG/2ML IJ SOLN
INTRAMUSCULAR | Status: DC | PRN
  Administered 2023-07-05: 25 ug via INTRAVENOUS

## 2023-07-05 MED ORDER — IOHEXOL 350 MG/ML SOLN
INTRAVENOUS | Status: DC | PRN
  Administered 2023-07-05: 55 mL via INTRA_ARTERIAL

## 2023-07-05 MED ORDER — LIDOCAINE HCL (PF) 1 % IJ SOLN
INTRAMUSCULAR | Status: DC | PRN
  Administered 2023-07-05: 2 mL via INTRADERMAL

## 2023-07-05 MED ORDER — SODIUM CHLORIDE 0.9 % IV SOLN: 250.0000 mL | INTRAVENOUS | Status: DC | PRN

## 2023-07-05 MED ORDER — VERAPAMIL HCL 2.5 MG/ML IV SOLN
INTRAVENOUS | Status: DC | PRN
  Administered 2023-07-05: 10 mL via INTRA_ARTERIAL

## 2023-07-05 MED ORDER — VERAPAMIL HCL 2.5 MG/ML IV SOLN
INTRAVENOUS | Status: AC
  Filled 2023-07-05: qty 2

## 2023-07-05 MED ORDER — SODIUM CHLORIDE 0.9% FLUSH: 3.0000 mL | INTRAVENOUS | Status: DC | PRN

## 2023-07-05 MED ORDER — ASPIRIN 81 MG PO CHEW: 81.0000 mg | CHEWABLE_TABLET | ORAL | Status: DC

## 2023-07-05 MED ORDER — MIDAZOLAM HCL 2 MG/2ML IJ SOLN
INTRAMUSCULAR | Status: AC
  Filled 2023-07-05: qty 2

## 2023-07-05 MED ORDER — SODIUM CHLORIDE 0.9 % WEIGHT BASED INFUSION: 1.0000 mL/kg/h | INTRAVENOUS | Status: DC

## 2023-07-05 MED ORDER — HEPARIN SODIUM (PORCINE) 1000 UNIT/ML IJ SOLN
INTRAMUSCULAR | Status: DC | PRN
  Administered 2023-07-05: 3000 [IU] via INTRAVENOUS

## 2023-07-05 MED ORDER — ACETAMINOPHEN 325 MG PO TABS: 650.0000 mg | ORAL_TABLET | ORAL | Status: DC | PRN

## 2023-07-05 SURGICAL SUPPLY — 10 items
CATH INFINITI 5 FR IM (CATHETERS) IMPLANT
CATH INFINITI 5FR AL1 (CATHETERS) IMPLANT
CATH INFINITI 5FR MULTPACK ANG (CATHETERS) IMPLANT
DEVICE RAD COMP TR BAND LRG (VASCULAR PRODUCTS) IMPLANT
ELECT DEFIB PAD ADLT CADENCE (PAD) IMPLANT
GLIDESHEATH SLEND A-KIT 6F 22G (SHEATH) IMPLANT
GUIDEWIRE INQWIRE 1.5J.035X260 (WIRE) IMPLANT
INQWIRE 1.5J .035X260CM (WIRE) ×1
PACK CARDIAC CATHETERIZATION (CUSTOM PROCEDURE TRAY) ×1 IMPLANT
SET ATX-X65L (MISCELLANEOUS) IMPLANT

## 2023-07-05 NOTE — Interval H&P Note (Signed)
History and Physical Interval Note:  07/05/2023 1:21 PM  Guy Morris  has presented today for surgery, with the diagnosis of angina.  The various methods of treatment have been discussed with the patient and family. After consideration of risks, benefits and other options for treatment, the patient has consented to  Procedure(s): LEFT HEART CATH AND CORS/GRAFTS ANGIOGRAPHY (N/A) and possible coronary angioplasty as a surgical intervention.  The patient's history has been reviewed, patient examined, no change in status, stable for surgery.  I have reviewed the patient's chart and labs.  Questions were answered to the patient's satisfaction.    Cath Lab Visit (complete for each Cath Lab visit)  Clinical Evaluation Leading to the Procedure:   ACS: No.  Non-ACS:    Anginal Classification: CCS III  Anti-ischemic medical therapy: Minimal Therapy (1 class of medications)  Non-Invasive Test Results: No non-invasive testing performed  Prior CABG: Previous CABG    Guy Morris

## 2023-07-08 ENCOUNTER — Encounter (HOSPITAL_COMMUNITY): Payer: Self-pay | Admitting: Cardiology

## 2023-07-08 DIAGNOSIS — R002 Palpitations: Secondary | ICD-10-CM | POA: Diagnosis not present

## 2023-07-08 NOTE — Progress Notes (Signed)
Order(s) created erroneously. Erroneous order ID: [id number]  Order moved by: Ian Malkin  Order move date/time: 07/08/2023 9:21 AM  Source Patient: [id number]  Source Contact: 07/03/2023  Destination Patient: [id number]  Destination Contact: 11/21/2022

## 2023-07-09 ENCOUNTER — Ambulatory Visit: Payer: Self-pay | Admitting: Internal Medicine

## 2023-07-09 NOTE — Telephone Encounter (Signed)
  Chief Complaint: Foot pain Symptoms: Right heel pain Frequency: Constant Pertinent Negatives: Patient denies Redness, swelling, injury Disposition: [] ED /[] Urgent Care (no appt availability in office) / [x] Appointment(In office/virtual)/ []  [facility]/ [] Home Care/ [] Refused Recommended Disposition /[] [facility]/ []  Follow-up with PCP Additional Notes: Patient called back for complaints of right heel pain x3 months. Patient states cause is unknown and denies injury to foot. Patient states that heel pain is worse in the morning and wakes him up out of sleep, and is worse when walking but now constantly present. Patient states he has been taking tylenol with relief. Patient advised by this RN to be seen per protocol within 3 days, to which patient was agreeable. Appt scheduled for 07/10/23. Patient advised by this RN to call back with worsening symptoms. Patient verbalized understanding.   Copied from CRM [id number]. Topic: Clinical - Red Word Triage >> Jul 09, 2023  4:59 PM Fuller Mandril wrote: Red Word that prompted transfer to Nurse Triage: Right ankle foot pain, very tender, unable to touch or stand up on Reason for Disposition  [1] MODERATE pain (e.g., interferes with normal activities, limping) AND [2] present > 3 days  Answer Assessment - Initial Assessment Questions 1. ONSET: "When did the pain start?"      3 months 2. LOCATION: "Where is the pain located?"      Right foot (bottom of heel) 3. PAIN: "How bad is the pain?"    (Scale 1-10; or mild, moderate, severe)   -  MILD (1-3): doesn't interfere with normal activities    -  MODERATE (4-7): interferes with normal activities (e.g., work or school) or awakens from sleep, limping    -  SEVERE (8-10): excruciating pain, unable to do any normal activities, unable to walk     Severe 4. WORK OR EXERCISE: "Has there been any recent work or exercise that involved this part of the body?"      Denies 5. CAUSE: "What do you  think is causing the leg pain?"     Denies 6. OTHER SYMPTOMS: "Do you have any other symptoms?" (e.g., chest pain, back pain, breathing difficulty, swelling, rash, fever, numbness, weakness)     Denies  Protocols used: Leg Pain-A-AH

## 2023-07-10 ENCOUNTER — Ambulatory Visit (INDEPENDENT_AMBULATORY_CARE_PROVIDER_SITE_OTHER): Payer: Medicare HMO | Admitting: Internal Medicine

## 2023-07-10 ENCOUNTER — Ambulatory Visit (INDEPENDENT_AMBULATORY_CARE_PROVIDER_SITE_OTHER)
Admission: RE | Admit: 2023-07-10 | Discharge: 2023-07-10 | Disposition: A | Discharge status: Home / Self Care | Payer: Medicare HMO | Source: Ambulatory Visit | Attending: Internal Medicine | Admitting: Internal Medicine

## 2023-07-10 ENCOUNTER — Encounter: Payer: Self-pay | Admitting: Internal Medicine

## 2023-07-10 VITALS — BP 134/74 | HR 70 | Ht 68.0 in | Wt 172.2 lb

## 2023-07-10 DIAGNOSIS — M79671 Pain in right foot: Secondary | ICD-10-CM

## 2023-07-10 DIAGNOSIS — R739 Hyperglycemia, unspecified: Secondary | ICD-10-CM

## 2023-07-10 DIAGNOSIS — E538 Deficiency of other specified B group vitamins: Secondary | ICD-10-CM

## 2023-07-10 DIAGNOSIS — E559 Vitamin D deficiency, unspecified: Secondary | ICD-10-CM

## 2023-07-10 DIAGNOSIS — M7731 Calcaneal spur, right foot: Secondary | ICD-10-CM | POA: Diagnosis not present

## 2023-07-10 DIAGNOSIS — M7661 Achilles tendinitis, right leg: Secondary | ICD-10-CM | POA: Diagnosis not present

## 2023-07-10 MED ORDER — TRAMADOL HCL 50 MG PO TABS: 50.0000 mg | ORAL_TABLET | Freq: Four times a day (QID) | ORAL | 0 refills | Status: DC | PRN

## 2023-07-10 NOTE — Patient Instructions (Addendum)
You appear to have at least severe plantar fasciitis, or even a bone spur  Please take all new medication as prescribed - the tramadol as needed for pain  Please continue all other medications as before, and refills have been done if requested.  Please have the pharmacy call with any other refills you may need.  Please keep your appointments with your specialists as you may have planned  You will be contacted regarding the referral for: orthopedic  Please go to the XRAY Department in the first floor for the x-ray testing - at the [facility]  You will be contacted by phone if any changes need to be made immediately.  Otherwise, you will receive a letter about your results with an explanation, but please check with MyChart first.  Please make an Appointment to return in Apr 22, or sooner if needed
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
## 2023-07-13 ENCOUNTER — Encounter: Payer: Self-pay | Admitting: Internal Medicine

## 2023-07-13 DIAGNOSIS — M79671 Pain in right foot: Secondary | ICD-10-CM | POA: Insufficient documentation

## 2023-07-13 DIAGNOSIS — R002 Palpitations: Secondary | ICD-10-CM | POA: Diagnosis not present

## 2023-07-13 NOTE — Assessment & Plan Note (Signed)
Lab Results  Component Value Date   HGBA1C 6.2 10/01/2022   Stable, pt to continue current medical treatment  - diet, wt control

## 2023-07-13 NOTE — Assessment & Plan Note (Signed)
Last vitamin D Lab Results  Component Value Date   VD25OH 34.67 10/01/2022   Low, to start oral replacement

## 2023-07-13 NOTE — Assessment & Plan Note (Signed)
Severe, c/w plantar fasciitis, can't r/o spurring - for pain control, right heel xray, and orthopedic referral

## 2023-07-13 NOTE — Assessment & Plan Note (Signed)
Lab Results  Component Value Date   VITAMINB12 1,059 (H) 10/01/2022   Stable, cont oral replacement - b12 1000 mcg qd

## 2023-07-17 ENCOUNTER — Encounter: Payer: Self-pay | Admitting: Internal Medicine

## 2023-07-24 ENCOUNTER — Other Ambulatory Visit: Payer: Self-pay | Admitting: Internal Medicine

## 2023-07-24 ENCOUNTER — Other Ambulatory Visit: Payer: Self-pay

## 2023-07-25 DIAGNOSIS — M722 Plantar fascial fibromatosis: Secondary | ICD-10-CM | POA: Diagnosis not present

## 2023-07-26 ENCOUNTER — Ambulatory Visit: Payer: Medicare HMO | Admitting: Physician Assistant

## 2023-07-31 DIAGNOSIS — E785 Hyperlipidemia, unspecified: Secondary | ICD-10-CM | POA: Diagnosis not present

## 2023-07-31 DIAGNOSIS — M1990 Unspecified osteoarthritis, unspecified site: Secondary | ICD-10-CM | POA: Diagnosis not present

## 2023-07-31 DIAGNOSIS — Z809 Family history of malignant neoplasm, unspecified: Secondary | ICD-10-CM | POA: Diagnosis not present

## 2023-07-31 DIAGNOSIS — I252 Old myocardial infarction: Secondary | ICD-10-CM | POA: Diagnosis not present

## 2023-07-31 DIAGNOSIS — K5900 Constipation, unspecified: Secondary | ICD-10-CM | POA: Diagnosis not present

## 2023-07-31 DIAGNOSIS — I739 Peripheral vascular disease, unspecified: Secondary | ICD-10-CM | POA: Diagnosis not present

## 2023-07-31 DIAGNOSIS — Z008 Encounter for other general examination: Secondary | ICD-10-CM | POA: Diagnosis not present

## 2023-07-31 DIAGNOSIS — Z87891 Personal history of nicotine dependence: Secondary | ICD-10-CM | POA: Diagnosis not present

## 2023-07-31 DIAGNOSIS — N529 Male erectile dysfunction, unspecified: Secondary | ICD-10-CM | POA: Diagnosis not present

## 2023-07-31 DIAGNOSIS — I25119 Atherosclerotic heart disease of native coronary artery with unspecified angina pectoris: Secondary | ICD-10-CM | POA: Diagnosis not present

## 2023-07-31 DIAGNOSIS — J449 Chronic obstructive pulmonary disease, unspecified: Secondary | ICD-10-CM | POA: Diagnosis not present

## 2023-07-31 DIAGNOSIS — Z8249 Family history of ischemic heart disease and other diseases of the circulatory system: Secondary | ICD-10-CM | POA: Diagnosis not present

## 2023-07-31 DIAGNOSIS — G629 Polyneuropathy, unspecified: Secondary | ICD-10-CM | POA: Diagnosis not present

## 2023-08-06 ENCOUNTER — Other Ambulatory Visit (HOSPITAL_COMMUNITY): Payer: Self-pay

## 2023-08-06 ENCOUNTER — Encounter (HOSPITAL_BASED_OUTPATIENT_CLINIC_OR_DEPARTMENT_OTHER): Payer: Self-pay | Admitting: Pulmonary Disease

## 2023-08-06 ENCOUNTER — Ambulatory Visit (HOSPITAL_BASED_OUTPATIENT_CLINIC_OR_DEPARTMENT_OTHER): Payer: Medicare HMO | Admitting: Pulmonary Disease

## 2023-08-06 VITALS — BP 128/78 | HR 54 | Ht 68.0 in | Wt 173.0 lb

## 2023-08-06 DIAGNOSIS — J449 Chronic obstructive pulmonary disease, unspecified: Secondary | ICD-10-CM | POA: Diagnosis not present

## 2023-08-06 MED ORDER — BUDESONIDE-FORMOTEROL FUMARATE 160-4.5 MCG/ACT IN AERO: 2.0000 | INHALATION_SPRAY | Freq: Two times a day (BID) | RESPIRATORY_TRACT | 3 refills | Status: DC

## 2023-08-06 NOTE — Progress Notes (Signed)
 Subjective:   PATIENT ID: Guy Morris GENDER: male DOB: 12-15-1950, MRN: [id number]  Chief Complaint  Patient presents with   Follow-up    COPD    Reason for Visit: Follow-up  Guy Morris is a 73 year old male former smoker with COPD, atrial fibrillation s/p ablation in 2021/22, HLD, GERD, BPH, hypothyroidism who presents for follow-up.  Initial consult He reports recent respiratory illness this has improved. He has had gradual shortness of breath in the last year. Wants to be active. Difficulty running errands. Walking shortness distances including going to doctor offices will tire him. Able to grocery shop if he stops and takes frequently breaks. Denies chronic coughing or wheezing. Had covid in 10/2019 and 06/2021.   08/02/22 He continues to have shortness of breath with exertion. However he now walks 8000 steps daily. Exercise does seem to be helping his endurance. Has not been able to pick up Anoro due to cost. Denies cough or wheezing. No exacerbations since our last visit requiring steroids or antibiotics.  10/11/22 Since our last visit he had ACDF of C3-4 in March. Reports symptoms are unchanged. Breathing is rough at times and triggered by pollen and during yardwork. Associated with chest tightness. Denies cough or wheezing. Has been using Wixela once a day. He enjoys being outdoors and taking care of his yard  and his breathing sometimes limits him. Waiting until he can get insurance for 2025 for more inhaler options.  03/04/23 Since our last visit he reports Advair but not consistently per wife. He takes allergy medicine during pollen season. Likes to be outdoors. Will get shortness of breath with activity during pollen season. Denies cough or wheezing. Walks daily. Walked 7000 steps daily  05/31/23 Since our last visit he reports that his oxygen has been in the low 90s. He reports compliant with Wixela. He reports coughing more and worsening shortness of breath in the  last few days. Symptoms worsen while laying down. No wheezing. Denies fevers and chills.  08/06/23 Since our last visit he reports he feels fairly. He is compliant with Wixela but reports hoarseness immediately after use. He feels he is short of breath and unsure if his inhaler is helping. He recently had a cardiac cath on 07/05/23 which demonstrated occluded grafts except for SVG to OM2 and LIMA to LAD however essentially nonfunctional due to brisk antegrade flow through native vessel.  Social History: Former smoker. Quit 8 years ago in 2016. 74 pack-years Previously worked as an Personnel officer x 30 years. Located at [employer], suspected asbestos exposure   Past Medical History:  Diagnosis Date   ABNORMAL TRANSAMINASE-LFT'S 08/04/2008   Qualifier: Diagnosis of  By: Russella Dar MD Bronson Curb T    ALLERGIC RHINITIS 05/06/2007   Qualifier: Diagnosis of  By: Jonny Ruiz MD, Len Blalock    Allergy    Anginal pain (HCC)    Intermittent. Takes Nitro PRN   Atrial fibrillation (HCC)    BPH (benign prostatic hyperplasia) 04/28/2012   COPD (chronic obstructive pulmonary disease) (HCC)    Coronary artery disease with history of myocardial infarction without history of CABG 04/11/2021   Degenerative joint disease 04/23/2011   Dysrhythmia    Hx of A.Fib   ERECTILE DYSFUNCTION 05/06/2007   Qualifier: Diagnosis of  By: Jonny Ruiz MD, Len Blalock    GERD 05/06/2007   Qualifier: Diagnosis of  By: Jonny Ruiz MD, Len Blalock    HYPERLIPIDEMIA 05/06/2007   Qualifier: Diagnosis of  By: Jonny Ruiz MD, Len Blalock  Hypertension    HYPOTHYROIDISM 05/06/2007   Qualifier: Diagnosis of  By: Jonny Ruiz MD, Len Blalock    Insomnia 04/28/2012   Myocardial infarction (HCC)    OSTEOARTHRITIS, KNEE, 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   Qualifier: Diagnosis of  By: Jonny Ruiz MD, Len Blalock    Peripheral vascular disease [facility])    Dr. Gerhard Munch   Personal history of colonic polyps 07/20/2008   Centricity Description: PERSONAL HX COLONIC POLYPS Qualifier: Diagnosis of  By: Christella Hartigan MD,  Melton Alar  Centricity Description: COLONIC POLYPS, HX OF Qualifier: Diagnosis of  By: Jonny Ruiz MD, Len Blalock    Pneumonia    Hx   RLS (restless legs syndrome) 10/17/2010   Urge incontinence 04/28/2012   vesicare heps per urology     Family History  Problem Relation Age of Onset   Cancer Mother        Breast Cancer   Arthritis Mother        RA   Cancer Father        Lung Cancer   Esophageal cancer Father    Pancreatic cancer Maternal Uncle    Stomach cancer Sister    Colon cancer Neg Hx    Colon polyps Neg Hx    Rectal cancer Neg Hx      Social History   Occupational History   Occupation: disabled - DJD knees, neck and shoulders - on SSI  Tobacco Use   Smoking status: Former    Current packs/day: 0.00    Average packs/day: 2.0 packs/day for 37.0 years (74.0 ttl pk-yrs)    Types: Cigarettes    Start date: 06/12/1963    Quit date: 06/11/2000    Years since quitting: 23.1   Smokeless tobacco: Never   Tobacco comments:    quit smoking cigarettes 2002 and quit cigars 2016  Vaping Use   Vaping status: Never Used  Substance and Sexual Activity   Alcohol use: No   Drug use: No   Sexual activity: Not Currently    Allergies  Allergen Reactions   Atorvastatin     myalgia   Claritin [Loratadine]     dizzy   Rofecoxib Itching   Rosuvastatin     myalgia   Zocor [Simvastatin] Other (See Comments)    myalgia     Outpatient Medications Prior to Visit  Medication Sig Dispense Refill   acetaminophen (TYLENOL) 500 MG tablet Take 1,000 mg by mouth daily.     ascorbic acid (VITAMIN C) 1000 MG tablet Take 1,000 mg by mouth daily.     aspirin EC 81 MG tablet Take 1 tablet (81 mg total) by mouth daily. Swallow whole. 30 tablet 11   Ca Carbonate-Mag Hydroxide (ROLAIDS PO) Take 1 tablet by mouth 3 (three) times daily as needed (heartburn).     cholecalciferol (VITAMIN D3) 25 MCG (1000 UNIT) tablet Take 1,000 Units by mouth daily.     cyclobenzaprine (FLEXERIL) 10 MG tablet Take 1 tablet  (10 mg total) by mouth 3 (three) times daily as needed. for muscle spams (Patient taking differently: Take 10 mg by mouth at bedtime.) 30 tablet 5   ezetimibe (ZETIA) 10 MG tablet Take 1 tablet (10 mg total) by mouth daily. 90 tablet 3   fluticasone-salmeterol (WIXELA INHUB) 250-50 MCG/ACT AEPB Inhale 1 puff into the lungs in the morning and at bedtime. 60 each 5   levothyroxine (SYNTHROID) 200 MCG tablet Take 1 tablet by mouth once daily 90 tablet 0   meclizine (ANTIVERT) 12.5 MG tablet TAKE 1 TABLET  BY MOUTH THREE TIMES DAILY AS NEEDED FOR DIZZINESS 30 tablet 0   Menthol, Topical Analgesic, (ZIMS MAX-FREEZE) 3.7 % GEL Apply 1 application  topically daily as needed (pain).     metoprolol tartrate (LOPRESSOR) 50 MG tablet Take 1/2 (one-half) tablet by mouth twice daily (Patient taking differently: Take 50 mg by mouth daily.) 90 tablet 0   omeprazole (PRILOSEC) 20 MG capsule Take 20 mg by mouth daily.     pregabalin (LYRICA) 50 MG capsule Take 50 mg by mouth daily.     traMADol (ULTRAM) 50 MG tablet Take 1 tablet (50 mg total) by mouth every 6 (six) hours as needed. 30 tablet 0   Turmeric Curcumin 500 MG CAPS Take 1,000 mg by mouth daily.     vitamin B-12 (CYANOCOBALAMIN) 500 MCG tablet Take 500 mcg by mouth daily.     Fluticasone-Umeclidin-Vilant (TRELEGY ELLIPTA) 200-62.5-25 MCG/ACT AEPB Inhale 1 puff into the lungs daily. (Patient not taking: Reported on 08/06/2023)     nitroGLYCERIN (NITROSTAT) 0.4 MG SL tablet Place 1 tablet (0.4 mg total) under the tongue every 5 (five) minutes as needed for chest pain. 90 tablet 3   No facility-administered medications prior to visit.    Review of Systems  Constitutional:  Positive for malaise/fatigue. Negative for chills, diaphoresis, fever and weight loss.  HENT:  Negative for congestion.   Respiratory:  Positive for cough and shortness of breath. Negative for hemoptysis, sputum production and wheezing.   Cardiovascular:  Negative for chest pain,  palpitations and leg swelling.     Objective:   Vitals:   08/06/23 1019  BP: 128/78  Pulse: (!) 54  SpO2: 97%  Weight: 173 lb (78.5 kg)  Height: 5\' 8"  (1.727 m)  SpO2: 97 %  Physical Exam: General: Well-appearing, no acute distress HENT: [place], AT Eyes: EOMI, no scleral icterus Respiratory: Clear to auscultation bilaterally.  No crackles, wheezing or rales Cardiovascular: RRR, -M/R/G, no JVD Extremities:-Edema,-tenderness Neuro: AAO x4, CNII-XII grossly intact Psych: Normal mood, normal affect  Data Reviewed:  Imaging: CT A/P 06/29/21 - Lower lobes with normal parenchyma CXR 05/22/22 - Right lateral chest with increased nodularity. Pleural plaques. Subtle nodularity in left base CT Chest 07/17/22 - Bilateral pleural plaques, calcified. Emphysema, mild  PFT: 04/25/20 FVC 4.09 (98%) FEV1 2.71 (88%) Ratio 66  Interpretation: Mild obstructive defect based on GOLD's criteria  07/02/22 FVC 4.01 (102%) FEV1 2.51 (866%) Ratio 62  TLC 117% RV 168% DLCO 93% Interpretation: Mild obstructive defect with air trapping   Labs: CBC    Component Value Date/Time   WBC 6.5 07/03/2023 1257   WBC 6.8 10/01/2022 1142   RBC 5.18 07/03/2023 1257   RBC 5.04 10/01/2022 1142   HGB 15.7 07/03/2023 1257   HCT 47.7 07/03/2023 1257   PLT 294 07/03/2023 1257   MCV 92 07/03/2023 1257   MCH 30.3 07/03/2023 1257   MCH 31.4 08/29/2022 0900   MCHC 32.9 07/03/2023 1257   MCHC 33.3 10/01/2022 1142   RDW 12.3 07/03/2023 1257   LYMPHSABS 2.0 10/01/2022 1142   LYMPHSABS 1.8 04/21/2020 1142   MONOABS 0.8 10/01/2022 1142   EOSABS 0.2 10/01/2022 1142   EOSABS 0.1 04/21/2020 1142   BASOSABS 0.0 10/01/2022 1142   BASOSABS 0.1 04/21/2020 1142   Absolute eos 09/12/21 - 100     Assessment & Plan:   Discussion: 73 year old male former smoker with COPD, atrial fibrillation s/p ablation in 2021/22, HLD, GERD, BPH, hypothyroidism who presents for COPD follow-up. Symptoms  persistent on ICS/LABA. Difficulty  tolerating due to hoarseness. Will change to MDI with spacer. Discussed clinical course and management of COPD including bronchodilator regimen and action plan for exacerbation. May need to add LAMA in the future  COPD - persistent symptoms --STOP Wixela 250-50 inhaler  --START generic Symbicort 160-4.5 mcg TWO puffs in the morning and evening. Rinse mouth out after use --Encourage regular exercise five days a walk up to 20-30 min daily. Uses his pedometer  Pleural plaques --Reviewed CT 07/17/22. Stable  Health Maintenance Immunization History  Administered Date(s) Administered   Influenza Split 04/23/2011, 04/28/2012   Influenza Whole 03/12/2007, 04/18/2010   Influenza, High Dose Seasonal PF 03/02/2021   Influenza,inj,Quad PF,6+ Mos 04/30/2013, 04/29/2014, 05/03/2015, 05/08/2016   Influenza-Unspecified 05/13/2017, 03/26/2018, 04/06/2019, 04/05/2020, 04/04/2023   Janssen (J&J) SARS-COV-2 Vaccination 09/16/2019   Pneumococcal Conjugate-13 11/06/2016   Pneumococcal Polysaccharide-23 11/05/2017   Td 04/11/1994, 09/13/2008   Tdap 08/14/2018   CT Lung Screen - consider in the future after above CT  No orders of the defined types were placed in this encounter.  Meds ordered this encounter  Medications   budesonide-formoterol (SYMBICORT) 160-4.5 MCG/ACT inhaler    Sig: Inhale 2 puffs into the lungs 2 (two) times daily.    Dispense:  10.2 g    Refill:  3    Generic only    Return in about 3 months (around 11/03/2023) for with NP for inhaler follow-up.  I have spent a total time of 35-minutes on the day of the appointment including chart review, data review, collecting history, coordinating care and discussing medical diagnosis and plan with the patient/family. Past medical history, allergies, medications were reviewed. Pertinent imaging, labs and tests included in this note have been reviewed and interpreted independently by me.  Canio Winokur Mechele Collin, MD [facility] 08/06/2023 10:48 AM

## 2023-08-06 NOTE — Patient Instructions (Signed)
 COPD - persistent symptoms --STOP Wixela 250-50 inhaler  --START generic Symbicort 160-4.5 mcg TWO puffs in the morning and evening. Rinse mouth out after use --Encourage regular exercise five days a walk up to 20-30 min daily. Uses his pedometer

## 2023-08-09 ENCOUNTER — Encounter: Payer: Self-pay | Admitting: Physician Assistant

## 2023-08-09 ENCOUNTER — Ambulatory Visit: Payer: Medicare HMO | Attending: Physician Assistant | Admitting: Physician Assistant

## 2023-08-09 VITALS — BP 128/84 | HR 62 | Ht 68.0 in | Wt 172.2 lb

## 2023-08-09 DIAGNOSIS — E785 Hyperlipidemia, unspecified: Secondary | ICD-10-CM

## 2023-08-09 DIAGNOSIS — I25700 Atherosclerosis of coronary artery bypass graft(s), unspecified, with unstable angina pectoris: Secondary | ICD-10-CM

## 2023-08-09 DIAGNOSIS — I491 Atrial premature depolarization: Secondary | ICD-10-CM

## 2023-08-09 DIAGNOSIS — I480 Paroxysmal atrial fibrillation: Secondary | ICD-10-CM

## 2023-08-09 DIAGNOSIS — I10 Essential (primary) hypertension: Secondary | ICD-10-CM

## 2023-08-09 NOTE — Progress Notes (Signed)
 Cardiology Office Note:  .   Date:  08/09/2023  ID:  Guy Morris, DOB August 13, 1950, MRN [id number] PCP: Corwin Levins, MD  [facility] Providers Cardiologist:  Rollene Rotunda, MD     History of Present Illness: .   Guy Morris is a 73 y.o. male with past medical history of CAD s/p CABG, PAD with known occluded L SFA, COPD, former smoker, atrial fibrillation, HTN and HLD.  He converted spontaneously when he had A-fib.  He had jaw pain in October 2021 and subsequently found to have three-vessel CAD.  He ultimately underwent four-vessel CABG with LIMA-LAD, SVG-OM, SVG-PDA, SVG-diagonal, clipping of the atrial appendage, MAZE procedure of left atrium with bilateral pulmonary vein isolation with RF ablation by Dr. Donata Clay on 04/28/2020.  Echocardiogram obtained on 05/03/2020 showed EF 50 to 55%, moderate hypokinesis of the LV, basal mid inferolateral wall, normal RV, no significant valve issue.  Due to prior history of A-fib, he was prophylactically placed on amiodarone during perioperative period.  Postprocedure, he converted in and out of atrial fibrillation with elevated heart rate.  Lopressor dose was titrated.  He has not been able to tolerate higher dose of statin and cannot afford other therapies.  Previous noninvasive study has revealed evidence of SFA occlusion on the left side, he was seen by Dr. Arbie Cookey of vascular surgery on 12/06/2021, given lack of any claudication symptom or evidence of critical limb ischemia, it was recommended to continue walking program.  He is not on anticoagulation therapy.  As part of his preoperative evaluation prior to spinal fusion surgery, he was seen by Dr. Flora Lipps on 07/30/2022 at which time he was having intermittent A-fib and also chest pain.  Chest pain was nonexertional and only last seconds at a time.  He says he has A-fib episode about 2-3 times a year.  He underwent Myoview on 08/06/2022 which showed EF 50%, less than 10% evidence of infarction, no  induced ischemia, overall low risk study.  Most recent carotid Doppler obtained in 04/22/2023 showed 1 to 39% stenosis in bilateral ICA, no significant carotid artery disease.  Bilateral vertebral artery showed antegrade flow.  He has been followed by Dr. Revonda Standard Chi of pulmonology service due to worsening shortness of breath.  He was given prednisone taper near the end of December.  For the past month, he has been noticing episodes of jaw pain, when symptoms come on, he would be symptomatic for 4 to 5 seconds where he feel his heart would stop.  Recent symptom feels different than A-fib.  I discussed his case with his cardiologist Dr. Antoine Poche who recommended cardiac catheterization ZIO monitor.  Cardiac catheterization performed on 07/05/2023 showed occluded SVG to RCA and occluded SVG to diagonal, LIMA to LAD is widely patent however under feels that native RCA in view of the brisk antegrade flow, widely patent SVG to OM 2 that retrogradely fills the large OM1.  Medical therapy was recommended, it was suspected his jaw pain is likely not anginal.  Heart monitor was benign without any significant bradycardia, patient does have 20% PAC burden, however less of that 1% PVC burden.  Patient presents today for follow-up.  Overall he has been doing well without further chest discomfort or shortness of breath.  He is breathing also improved.  He is doing well from the cardiac perspective and can follow-up with Dr. Antoine Poche in 6 months.  ROS:   He denies chest pain, palpitations, dyspnea, pnd, orthopnea, n, v, dizziness, syncope,  edema, weight gain, or early satiety. All other systems reviewed and are otherwise negative except as noted above.    Studies Reviewed: .        Cardiac Studies & Procedures   ______________________________________________________________________________________________ CARDIAC CATHETERIZATION  CARDIAC CATHETERIZATION 07/05/2023  Narrative Images from the original result were not  included. Left Heart Catheterization 07/05/23: Hemodynamic data: LVEDP 15 mmHg.  No pressure gradient across the aortic valve.  Angiographic data: LM: Ostium has a 90% smooth stenosis.  Moderate caliber vessel. LAD: Large vessel, has mild diffuse luminal irregularity, proximal segment has a 30% and mid segment is a 30% stenosis.  Very small diagonals with mild diffuse disease.  LAD retrogradely fills. LCx: Occluded in the proximal to mid segment.  Gives origin to large OM 1 and 2 and a small OM 3.  Between OM 1 and 2 there is a 80% stenosis. RCA: Dominant vessel and a large caliber vessel.  Mild luminal irregularities noted.  SVG to RCA, SVG to D1 are occluded.  LIMA to LAD is widely patent however under fills the native LAD in view of brisk antegrade flow.  There is a large pectoral branch arising from the LIMA. SVG to OM 2: Widely patent and retrogradely fills the large OM1 and spite of high-grade stenosis between OM1 and OM 2 in the native CX.   Impression and recommendations: All his grafts are functionally occluded except SVG to OM 2 which is widely patent.  Mild to moderate native vessel disease.  Although LIMA to LAD is patent is essentially nonfunctional due to brisk antegrade flow through native vessel.  Suspect his jaw pain is probably nonanginal pain.  Findings Coronary Findings Diagnostic  Dominance: Right  Left Main Vessel is large. Ost LM lesion is 30% stenosed.  Left Anterior Descending Vessel is large. Prox LAD to Mid LAD lesion is 30% stenosed. Mid LAD lesion is 50% stenosed. The lesion is mildly calcified. Mid LAD to Dist LAD lesion is 30% stenosed.  First Diagonal Branch Vessel is large in size.  Second Diagonal Branch Vessel is moderate in size.  Third Diagonal Branch Vessel is small in size.  Left Circumflex Vessel is moderate in size. Prox Cx lesion is 100% stenosed. Prox Cx to Mid Cx lesion is 80% stenosed.  Second Obtuse Marginal Branch Vessel is  small in size.  Third Obtuse Marginal Branch Vessel is moderate in size.  Right Coronary Artery Vessel is large. Mid RCA lesion is 30% stenosed.  Right Posterior Descending Artery Vessel is moderate in size.  Right Posterior Atrioventricular Artery Vessel is large in size.  LIMA Graft To Dist LAD  Graft To RPAV Origin lesion is 100% stenosed.  Graft To 2nd Mrg  Graft To 1st Diag Origin lesion is 100% stenosed.  Intervention  No interventions have been documented.   CARDIAC CATHETERIZATION  CARDIAC CATHETERIZATION 04/25/2020  Narrative Conclusions: 1. Multivessel coronary artery disease, including focal, eccentric 60% ostial LMCA lesion (MLA 5.96 mm^2 by IVUS), serial 40-50% proximal through distal LAD stenoses, 80% ostial D2 stenosis, 50% proximal and 100% mid LCx stenoses with right-to-left collateral supplying the distal OM branches, and 50% mid RCA disease. 2. Mildly reduced left ventricular systolic function (assessment limited by atrial fibrillation with rapid ventricular response).  LVEF ~ 45-50% with global hypokinesis and normal filling pressure.  Recommendations: 1. Given multivessel coronary artery disease that was significant in the LAD and LCx territories on recent cardiac CTA as well as focal ostial LMCA stenosis that is significant by IVUS  criteria, I recommend cardiac surgery consultation for CABG.  The LMCA lesion is treatable from a percutaneous approach.  However, the multisegmental LAD disease involving D2 and CTO of the LCx are less favorable for stenting. 2. Start heparin infusion 2 hours after removal of TR band. 3. Aggressive secondary prevention of coronary artery disease.  Yvonne Kendall, MD [facility]  Findings Coronary Findings Diagnostic  Dominance: Right  Left Main Vessel is large. Ost LM lesion is 60% stenosed. Ultrasound (IVUS) was performed. Cross-sectional area: 6 mm.  Left Anterior Descending Vessel is large. Prox LAD to  Mid LAD lesion is 40% stenosed. Mid LAD lesion is 50% stenosed. The lesion is mildly calcified. Mid LAD to Dist LAD lesion is 50% stenosed.  First Diagonal Branch Vessel is large in size.  Second Diagonal Branch Vessel is moderate in size. 2nd Diag lesion is 80% stenosed.  Third Diagonal Branch Vessel is small in size.  Left Circumflex Vessel is moderate in size. Prox Cx lesion is 50% stenosed. Prox Cx to Mid Cx lesion is 100% stenosed. The lesion is chronically occluded with right-to-left collateral flow.  Second Obtuse Marginal Branch Vessel is small in size.  Third Obtuse Marginal Branch Vessel is moderate in size. Collaterals 3rd Mrg filled by collaterals from 3rd RPL.  Right Coronary Artery Vessel is large. Mid RCA lesion is 50% stenosed.  Right Posterior Descending Artery Vessel is moderate in size.  Right Posterior Atrioventricular Artery Vessel is large in size.  Intervention  No interventions have been documented.   STRESS TESTS  MYOCARDIAL PERFUSION IMAGING 08/06/2022  Narrative   Findings are consistent with infarction. The study is low risk (< 10% infarcted myocardium).   No ST deviation was noted.   Left ventricular function is abnormal. Nuclear stress EF: 50 %. The left ventricular ejection fraction is mildly decreased (45-54%). End diastolic cavity size is mildly enlarged. End systolic cavity size is normal.   Prior study available for comparison from 11/09/2015. No changes compared to prior study.  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 LVEF 50%, this may be within normal range for imaging modality.   ECHOCARDIOGRAM  ECHOCARDIOGRAM LIMITED 05/03/2020  Narrative ECHOCARDIOGRAM LIMITED REPORT    Patient Name:   Guy Morris Date of Exam: 05/03/2020 Medical Rec #:  [id number]      Height:       68.0 in Accession #:    [id number]     Weight:       169.1 lb Date of Birth:  1951-02-24      BSA:           1.903 m Patient Age:    69 years       BP:           103/77 mmHg Patient Gender: M              HR:           101 bpm. Exam Location:  Inpatient  Procedure: Limited Echo  Indications:    Pericardial effusion 423.9 / I31.3  History:        Patient has prior history of Echocardiogram examinations, most recent 04/25/2020. Prior CABG, COPD, Arrythmias:Atrial Fibrillation, Signs/Symptoms:Chest Pain; Risk Factors:Dyslipidemia and Former Smoker.  Sonographer:    Renella Cunas RDCS Referring Phys: [id number] PETER VAN TRIGT  IMPRESSIONS   1. Left ventricular ejection fraction, by estimation, is 50 to 55%. The left ventricle has low normal function. The left ventricle demonstrates regional wall motion abnormalities (  see scoring diagram/findings for description). Left ventricular diastolic function could not be evaluated. There is moderate hypokinesis of the left ventricular, basal-mid inferolateral wall. 2. Right ventricular systolic function is normal. The right ventricular size is normal. Tricuspid regurgitation signal is inadequate for assessing PA pressure. 3. The mitral valve is normal in structure. No evidence of mitral valve regurgitation. No evidence of mitral stenosis. 4. The aortic valve is normal in structure. Aortic valve regurgitation is not visualized. No aortic stenosis is present.  Comparison(s): A prior study was performed on 04/25/2020. No significant change from prior study. Prior images reviewed side by side. The inferolateral wall motion abnormality was also present on the previous study. Postoperative paradoxical septal motion is new.  FINDINGS Left Ventricle: Left ventricular ejection fraction, by estimation, is 50 to 55%. The left ventricle has low normal function. The left ventricle demonstrates regional wall motion abnormalities. Moderate hypokinesis of the left ventricular, basal-mid inferolateral wall. The left ventricular internal cavity size was normal in size. There is  no left ventricular hypertrophy. Abnormal (paradoxical) septal motion consistent with post-operative status. Left ventricular diastolic function could not be evaluated. Left ventricular diastolic function could not be evaluated due to atrial fibrillation.  Right Ventricle: The right ventricular size is normal. No increase in right ventricular wall thickness. Right ventricular systolic function is normal. Tricuspid regurgitation signal is inadequate for assessing PA pressure.  Left Atrium: Left atrial size was normal in size.  Right Atrium: Right atrial size was normal in size.  Pericardium: There is no evidence of pericardial effusion.  Mitral Valve: The mitral valve is normal in structure. No evidence of mitral valve stenosis.  Tricuspid Valve: The tricuspid valve is normal in structure. Tricuspid valve regurgitation is not demonstrated. No evidence of tricuspid stenosis.  Aortic Valve: The aortic valve is normal in structure. Aortic valve regurgitation is not visualized. No aortic stenosis is present.  Pulmonic Valve: The pulmonic valve was normal in structure. Pulmonic valve regurgitation is not visualized. No evidence of pulmonic stenosis.  Aorta: The aortic root is normal in size and structure.  Venous: The inferior vena cava was not well visualized.  IAS/Shunts: No atrial level shunt detected by color flow Doppler.  LEFT VENTRICLE PLAX 2D LVIDd:         5.20 cm LVIDs:         4.80 cm LV PW:         0.90 cm LV IVS:        0.90 cm LVOT diam:     2.20 cm LV SV:         38 LV SV Index:   20 LVOT Area:     3.80 cm   LEFT ATRIUM         Index LA diam:    3.30 cm 1.73 cm/m AORTIC VALVE LVOT Vmax:   62.70 cm/s LVOT Vmean:  47.000 cm/s LVOT VTI:    0.101 m  AORTA Ao Root diam: 3.30 cm   SHUNTS Systemic VTI:  0.10 m Systemic Diam: 2.20 cm  Thurmon Fair MD Electronically signed by Thurmon Fair MD Signature Date/Time: 05/03/2020/2:00:42 PM    Final    TEE  ECHO INTRAOPERATIVE TEE 04/28/2020  Narrative *INTRAOPERATIVE TRANSESOPHAGEAL REPORT *    Patient Name:   Guy Morris Date of Exam: 04/28/2020 Medical Rec #:  [id number]      Height:       68.0 in Accession #:    [id number]     Weight:  172.4 lb Date of Birth:  Sep 21, 1950      BSA:          1.92 m Patient Age:    69 years       BP:           135/71 mmHg Patient Gender: M              HR:           53 bpm. Exam Location:  Inpatient  Transesophogeal exam was perform intraoperatively during surgical procedure. Patient was closely monitored under general anesthesia during the entirety of examination.  Indications:     I25.110 Atherosclerotic heart disease of native coronary artery with unstable angina pectoris; I48.91* Unspeicified atrial fibrillation Performing Phys: [id number] PETER VAN TRIGT Diagnosing Phys: Leslye Peer MD  Complications: No known complications during this procedure. POST-OP IMPRESSIONS - Left Ventricle: The left ventricle is unchanged from pre-bypass. - Right Ventricle: The right ventricle appears unchanged from pre-bypass. - Aorta: The aorta appears unchanged from pre-bypass. - Left Atrium: The left atrium appears unchanged from pre-bypass. - Left Atrial Appendage: The left atrial appendage appears unchanged from pre-bypass. - Aortic Valve: The aortic valve appears unchanged from pre-bypass. - Mitral Valve: The mitral valve appears unchanged from pre-bypass. - Tricuspid Valve: The tricuspid valve appears unchanged from pre-bypass. - Interatrial Septum: The interatrial septum appears unchanged from pre-bypass. - Interventricular Septum: The interventricular septum appears unchanged from pre-bypass. - Pericardium: The pericardium appears unchanged from pre-bypass.  PRE-OP FINDINGS Left Ventricle: The left ventricle has low normal systolic function, with an ejection fraction of 50-55%. The cavity size was normal. There is no increase in left  ventricular wall thickness.  Right Ventricle: The right ventricle has normal systolic function. The cavity was normal. There is no increase in right ventricular wall thickness.  Left Atrium: Left atrial size was normal in size. The left atrial appendage is well visualized and there is no evidence of thrombus present.  Right Atrium: Right atrial size was normal in size.  Interatrial Septum: The interatrial septum was not well visualized.  Pericardium: There is no evidence of pericardial effusion.  Mitral Valve: The mitral valve is normal in structure. Mitral valve regurgitation is trivial by color flow Doppler. There is No evidence of mitral stenosis.  Tricuspid Valve: The tricuspid valve was normal in structure. Tricuspid valve regurgitation was not visualized by color flow Doppler.  Aortic Valve: The aortic valve is tricuspid Aortic valve regurgitation was not visualized by color flow Doppler. There is no stenosis of the aortic valve.  Pulmonic Valve: The pulmonic valve was not well visualized. Pulmonic valve regurgitation was not assessed by color flow Doppler.   Aorta: The aortic arch, ascending aorta and aortic root are normal in size and structure.   Leslye Peer MD Electronically signed by Leslye Peer MD Signature Date/Time: 04/28/2020/2:20:25 PM    Final    CT SCANS  CT CORONARY FRACTIONAL FLOW RESERVE DATA PREP 04/18/2020  Narrative EXAM: CT FFR ANALYSIS  CLINICAL DATA:  73 year old male with abnormal coronary CTA and chest pain.  FINDINGS: FFRct analysis was performed on the original cardiac CT angiogram dataset. Diagrammatic representation of the FFRct analysis is provided in a separate PDF document in PACS. This dictation was created using the PDF document and an interactive 3D model of the results. 3D model is not available in the EMR/PACS. Normal FFR range is >0.80.  1. Left Main: 0.96.  2. LAD: Proximal: 0.96, mid: 0.81, distal: 0.69. 3. LCX:  Occluded in the mid portion. 4. RCA: Proximal: 0.95, distal: 0.85.  IMPRESSION: 1. CT FFR analysis showed severe stenosis in the mid LAD and in the mid portion of a small lumen non-dominant LCX artery. A cardiac catheterization is recommended.   Electronically Signed By: Tobias Alexander On: 04/19/2020 11:50   CT CORONARY MORPH W/CTA COR W/SCORE 04/18/2020  Addendum 04/18/2020  2:20 PM ADDENDUM REPORT: 04/18/2020 14:18  CLINICAL DATA:  73 year old male with h/o dyslipidemia, atrial fibrillation and chest pain.  EXAM: Cardiac/Coronary  CTA  TECHNIQUE: The patient was scanned on a [employer].  FINDINGS: A 100 kV prospective scan was triggered in the descending thoracic aorta at 111 HU's. Axial non-contrast 3 mm slices were carried out through the heart. The data set was analyzed on a dedicated work station and scored using the Agatson method. Gantry rotation speed was 250 msecs and collimation was .6 mm. 100 mg of PO Metoprolol and 0.8 mg of sl NTG was given. The 3D data set was reconstructed in 5% intervals of the 67-82 % of the R-R cycle. Diastolic phases were analyzed on a dedicated work station using MPR, MIP and VRT modes. The patient received 80 cc of contrast.  Aorta: Normal size. Mild diffuse atherosclerotic plaque and calcifications. No dissection.  Aortic Valve:  Trileaflet. Trivial calcifications.  Coronary Arteries:  Normal coronary origin.  Right dominance.  RCA is a large dominant artery that gives rise to PDA and PLA. There is mild diffuse predominantly calcified plaque in the proximal portion with stenosis 25-49%. Mid RCA has diffuse, mixed, predominantly calcified plaque with stenosis 50-69%. Distal RCA/PDA/PLA has minimal diffuse plaque.  Left main is a large artery that gives rise to LAD and LCX arteries. Ostial left main has a mild non-calcified plaque with stenosis 25-49%.  LAD is a large vessel that gives rise to two diagonal  arteries. Proximal LAD has a moderate diffuse predominantly calcified plaque with stenosis 50-69% stenosis. Mid LAD has moderate to severe mixed diffuse plaque with a focal stenosis suspicious for > 70% stenosis.  D1 is a small artery with only minimal plaque.  D2 is occluded.  LCX is a small lumen non-dominant artery that has severe calcified plaque in the mid portion causing occlusion.  Other findings:  Normal pulmonary vein drainage into the left atrium.  Normal left atrial appendage without a thrombus.  Normal size of the pulmonary artery.  IMPRESSION: 1. Coronary calcium score of 564. This was 78 percentile for age and sex matched control.  2. Normal coronary origin with right dominance.  3. CAD-RADS 4 with diffuse moderate stenoses and a severe focal mixed plaque in the mid LAD suspicious for stenosis > 70%. Occluded small lumen non-dominant LCX artery. consider symptom-guided anti-ischemic pharmacotherapy as well as risk factor modification per guideline directed care. Additional analysis with CT FFR will be submitted.   Electronically Signed By: Tobias Alexander On: 04/18/2020 14:18  Narrative EXAM: OVER-READ INTERPRETATION  CT CHEST  The following report is an over-read performed by radiologist Dr. Trudie Reed of [facility] on 04/18/2020. This over-read does not include interpretation of cardiac or coronary anatomy or pathology. The coronary calcium score/coronary CTA interpretation by the cardiologist is attached.  COMPARISON:  Chest CT 07/21/2014.  FINDINGS: Aortic atherosclerosis. Pleural plaques are noted in the thorax bilaterally (right greater than left) with some calcified pleural plaques in the right hemithorax. Within the visualized portions of the thorax there are no suspicious appearing pulmonary nodules or masses, there  is no acute consolidative airspace disease, no pleural effusions, no pneumothorax and no lymphadenopathy.  Visualized portions of the upper abdomen are unremarkable. There are no aggressive appearing lytic or blastic lesions noted in the visualized portions of the skeleton.  IMPRESSION: 1. Aortic atherosclerosis. 2. Bilateral pleural plaques, many of which in the right hemithorax are calcified. 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  Electronically Signed: By: Trudie Reed M.D. On: 04/18/2020 09:26     ______________________________________________________________________________________________      Risk Assessment/Calculations:    CHA2DS2-VASc Score = 3   This indicates a 3.2% annual risk of stroke. The patient's score is based upon: CHF History: 0 HTN History: 1 Diabetes History: 0 Stroke History: 0 Vascular Disease History: 1 Age Score: 1 Gender Score: 0            Physical Exam:   VS:  BP 128/84 (BP Location: Left Arm, Patient Position: Sitting, Cuff Size: Normal)   Pulse 62   Ht 5\' 8"  (1.727 m)   Wt 172 lb 3.2 oz (78.1 kg)   SpO2 96%   BMI 26.18 kg/m    Wt Readings from Last 3 Encounters:  08/09/23 172 lb 3.2 oz (78.1 kg)  08/06/23 173 lb (78.5 kg)  07/10/23 172 lb 3.2 oz (78.1 kg)    GEN: Well nourished, well developed in no acute distress NECK: No JVD; No carotid bruits CARDIAC: RRR, no murmurs, rubs, gallops RESPIRATORY:  Clear to auscultation without rales, wheezing or rhonchi  ABDOMEN: Soft, non-tender, non-distended EXTREMITIES:  No edema; No deformity   ASSESSMENT AND PLAN: .    Coronary Artery Disease History of 3 vessel CAD with 4 vessel CABG in 2021.  - Recent cardiac catheterization on 07/05/2023 showed occluded SVG to RCA and SVG to diagonal. Native RCA underfills due to brisk integrate flow. SVG to OM2 is patent and retrograde fills the large OM1. Jaw pain likely not angina. -Continue medical therapy.  Paroxysmal Atrial Fibrillation History of AFib with spontaneous conversion. Recent heart monitor showed 20% PAC  burden but no AFib episodes.  No evidence of recurrent A-fib  Hypertension: Blood pressure well-controlled  Hyperlipidemia: On Zetia.  Intolerant of statins  PACs: 20% PACs noted on recent heart monitor, reassured the patient PACs are benign.  PVC burden less than 1%.     Dispo: Follow-up with Dr. Antoine Poche in 6 months  Signed, Azalee Course, [place]

## 2023-08-09 NOTE — Patient Instructions (Signed)
 Medication Instructions:  NO CHANGES *If you need a refill on your cardiac medications before your next appointment, please call your pharmacy*   Lab Work: NO LABS If you have labs (blood work) drawn today and your tests are completely normal, you will receive your results only by: MyChart Message (if you have MyChart) OR A paper copy in the mail If you have any lab test that is abnormal or we need to change your treatment, we will call you to review the results.   Testing/Procedures: NO TESTING   Follow-Up: At [facility], you and your health needs are our priority.  As part of our continuing mission to provide you with exceptional heart care, we have created designated Provider Care Teams.  These Care Teams include your primary Cardiologist (physician) and Advanced Practice Providers (APPs -  Physician Assistants and Nurse Practitioners) who all work together to provide you with the care you need, when you need it.  Your next appointment:   6 month(s)  Provider:   Rollene Rotunda, MD   Other Instructions

## 2023-08-12 ENCOUNTER — Telehealth (INDEPENDENT_AMBULATORY_CARE_PROVIDER_SITE_OTHER): Payer: Self-pay | Admitting: Pulmonary Disease

## 2023-08-12 NOTE — Telephone Encounter (Signed)
 Is change back to [facility] appropriate?

## 2023-08-12 NOTE — Telephone Encounter (Signed)
 Elease Hashimoto wife states Symbicort too expensive. Would like Wixela called into pharmacy, Pharmacy is [facility]. Elease Hashimoto phone number is [phone].

## 2023-08-13 ENCOUNTER — Other Ambulatory Visit (HOSPITAL_COMMUNITY): Payer: Self-pay

## 2023-08-13 MED ORDER — FLUTICASONE-SALMETEROL 250-50 MCG/ACT IN AEPB: 1.0000 | INHALATION_SPRAY | Freq: Two times a day (BID) | RESPIRATORY_TRACT | 3 refills | Status: AC

## 2023-08-13 NOTE — Telephone Encounter (Signed)
 Will change from generic symbicort ($59) to Wixela ($5).

## 2023-08-23 ENCOUNTER — Ambulatory Visit

## 2023-08-23 ENCOUNTER — Ambulatory Visit: Payer: Medicare HMO

## 2023-08-23 VITALS — Ht 68.0 in | Wt 172.0 lb

## 2023-08-23 DIAGNOSIS — Z0000 Encounter for general adult medical examination without abnormal findings: Secondary | ICD-10-CM

## 2023-08-23 NOTE — Patient Instructions (Signed)
 Guy Morris , Thank you for taking time to come for your Medicare Wellness Visit. I appreciate your ongoing commitment to your health goals. Please review the following plan we discussed and let me know if I can assist you in the future.   Referrals/Orders/Follow-Ups/Clinician Recommendations: Aim for 30 minutes of exercise or brisk walking, 6-8 glasses of water, and 5 servings of fruits and vegetables each day. Educated and advised on getting the Shingles vaccine.  This is a list of the screening recommended for you and due dates:  Health Maintenance  Topic Date Due   Zoster (Shingles) Vaccine (1 of 2) Never done   Medicare Annual Wellness Visit  08/22/2024   Colon Cancer Screening  08/24/2026   DTaP/Tdap/Td vaccine (4 - Td or Tdap) 08/13/2028   Pneumonia Vaccine  Completed   Flu Shot  Completed   Hepatitis C Screening  Completed   HPV Vaccine  Aged Out   COVID-19 Vaccine  Discontinued    Advanced directives: (Copy Requested) Please bring a copy of your health care power of attorney and living will to the office to be added to your chart at your convenience. You can mail to [facility] [address] [place], [place] [address] or email to [email or web address]  Next Medicare Annual Wellness Visit scheduled for next year: Yes  Managing Pain Without Opioids Opioids are strong medicines used to treat moderate to severe pain. For some people, especially those who have long-term (chronic) pain, opioids may not be the best choice for pain management due to: Side effects like nausea, constipation, and sleepiness. The risk of addiction (opioid use disorder). The longer you take opioids, the greater your risk of addiction. Pain that lasts for more than 3 months is called chronic pain. Managing chronic pain usually requires more than one approach and is often provided by a team of health care providers working together (multidisciplinary approach). Pain management may be done  at a pain management center or pain clinic. How to manage pain without the use of opioids Use non-opioid medicines Non-opioid medicines for pain may include: Over-the-counter or prescription non-steroidal anti-inflammatory drugs (NSAIDs). These may be the first medicines used for pain. They work well for muscle and bone pain, and they reduce swelling. Acetaminophen. This over-the-counter medicine may work well for milder pain but not swelling. Antidepressants. These may be used to treat chronic pain. A certain type of antidepressant (tricyclics) is often used. These medicines are given in lower doses for pain than when used for depression. Anticonvulsants. These are usually used to treat seizures but may also reduce nerve (neuropathic) pain. Muscle relaxants. These relieve pain caused by sudden muscle tightening (spasms). You may also use a pain medicine that is applied to the skin as a patch, cream, or gel (topical analgesic), such as a numbing medicine. These may cause fewer side effects than medicines taken by mouth. Do certain therapies as directed Some therapies can help with pain management. They include: Physical therapy. You will do exercises to gain strength and flexibility. A physical therapist may teach you exercises to move and stretch parts of your body that are weak, stiff, or painful. You can learn these exercises at physical therapy visits and practice them at home. Physical therapy may also involve: Massage. Heat wraps or applying heat or cold to affected areas. Electrical signals that interrupt pain signals (transcutaneous electrical nerve stimulation, TENS). Weak lasers that reduce pain and swelling (low-level laser therapy). Signals from your body that help you  learn to regulate pain (biofeedback). Occupational therapy. This helps you to learn ways to function at home and work with less pain. Recreational therapy. This involves trying new activities or hobbies, such as a  physical activity or drawing. Mental health therapy, including: Cognitive behavioral therapy (CBT). This helps you learn coping skills for dealing with pain. Acceptance and commitment therapy (ACT) to change the way you think and react to pain. Relaxation therapies, including muscle relaxation exercises and mindfulness-based stress reduction. Pain management counseling. This may be individual, family, or group counseling.  Receive medical treatments Medical treatments for pain management include: Nerve block injections. These may include a pain blocker and anti-inflammatory medicines. You may have injections: Near the spine to relieve chronic back or neck pain. Into joints to relieve back or joint pain. Into nerve areas that supply a painful area to relieve body pain. Into muscles (trigger point injections) to relieve some painful muscle conditions. A medical device placed near your spine to help block pain signals and relieve nerve pain or chronic back pain (spinal cord stimulation device). Acupuncture. Follow these instructions at home Medicines Take over-the-counter and prescription medicines only as told by your health care provider. If you are taking pain medicine, ask your health care providers about possible side effects to watch out for. Do not drive or use heavy machinery while taking prescription opioid pain medicine. Lifestyle  Do not use drugs or alcohol to reduce pain. If you drink alcohol, limit how much you have to: 0-1 drink a day for women who are not pregnant. 0-2 drinks a day for men. Know how much alcohol is in a drink. In the [place] one drink equals one 12 oz bottle of beer (355 mL), one 5 oz glass of wine (148 mL), or one 1 oz glass of hard liquor (44 mL). Do not use any products that contain nicotine or tobacco. These products include cigarettes, chewing tobacco, and vaping devices, such as e-cigarettes. If you need help quitting, ask your health care provider. Eat  a healthy diet and maintain a healthy weight. Poor diet and excess weight may make pain worse. Eat foods that are high in fiber. These include fresh fruits and vegetables, whole grains, and beans. Limit foods that are high in fat and processed sugars, such as fried and sweet foods. Exercise regularly. Exercise lowers stress and may help relieve pain. Ask your health care provider what activities and exercises are safe for you. If your health care provider approves, join an exercise class that combines movement and stress reduction. Examples include yoga and tai chi. Get enough sleep. Lack of sleep may make pain worse. Lower stress as much as possible. Practice stress reduction techniques as told by your therapist. General instructions Work with all your pain management providers to find the treatments that work best for you. You are an important member of your pain management team. There are many things you can do to reduce pain on your own. Consider joining an online or in-person support group for people who have chronic pain. Keep all follow-up visits. This is important. Where to find more information You can find more information about managing pain without opioids from: American Academy of Pain Medicine: painmed.org Institute for Chronic Pain: instituteforchronicpain.org American Chronic Pain Association: theacpa.org Contact a health care provider if: You have side effects from pain medicine. Your pain gets worse or does not get better with treatments or home therapy. You are struggling with anxiety or depression. Summary Many types of pain  can be managed without opioids. Chronic pain may respond better to pain management without opioids. Pain is best managed when you and a team of health care providers work together. Pain management without opioids may include non-opioid medicines, medical treatments, physical therapy, mental health therapy, and lifestyle changes. Tell your health care  providers if your pain gets worse or is not being managed well enough. This information is not intended to replace advice given to you by your health care provider. Make sure you discuss any questions you have with your health care provider. Document Revised: 09/07/2020 Document Reviewed: 09/07/2020 Elsevier Patient Education  2024 [employer].
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
## 2023-09-14 ENCOUNTER — Other Ambulatory Visit: Payer: Self-pay | Admitting: Cardiology

## 2023-10-01 ENCOUNTER — Other Ambulatory Visit (HOSPITAL_COMMUNITY): Payer: Self-pay

## 2023-10-01 ENCOUNTER — Encounter: Payer: Self-pay | Admitting: Internal Medicine

## 2023-10-01 ENCOUNTER — Other Ambulatory Visit: Payer: Self-pay | Admitting: Internal Medicine

## 2023-10-01 ENCOUNTER — Ambulatory Visit (INDEPENDENT_AMBULATORY_CARE_PROVIDER_SITE_OTHER): Payer: Medicare Other | Admitting: Internal Medicine

## 2023-10-01 ENCOUNTER — Telehealth: Payer: Self-pay

## 2023-10-01 VITALS — BP 122/76 | HR 76 | Temp 97.8°F | Ht 68.0 in | Wt 167.0 lb

## 2023-10-01 DIAGNOSIS — E538 Deficiency of other specified B group vitamins: Secondary | ICD-10-CM

## 2023-10-01 DIAGNOSIS — J309 Allergic rhinitis, unspecified: Secondary | ICD-10-CM

## 2023-10-01 DIAGNOSIS — E559 Vitamin D deficiency, unspecified: Secondary | ICD-10-CM | POA: Diagnosis not present

## 2023-10-01 DIAGNOSIS — Z0001 Encounter for general adult medical examination with abnormal findings: Secondary | ICD-10-CM | POA: Diagnosis not present

## 2023-10-01 DIAGNOSIS — R739 Hyperglycemia, unspecified: Secondary | ICD-10-CM

## 2023-10-01 DIAGNOSIS — E7800 Pure hypercholesterolemia, unspecified: Secondary | ICD-10-CM | POA: Diagnosis not present

## 2023-10-01 DIAGNOSIS — N320 Bladder-neck obstruction: Secondary | ICD-10-CM | POA: Diagnosis not present

## 2023-10-01 LAB — HEMOGLOBIN A1C: Hgb A1c MFr Bld: 6 % (ref 4.6–6.5)

## 2023-10-01 LAB — BASIC METABOLIC PANEL WITH GFR
BUN: 16 mg/dL (ref 6–23)
CO2: 26 meq/L (ref 19–32)
Calcium: 10 mg/dL (ref 8.4–10.5)
Chloride: 103 meq/L (ref 96–112)
Creatinine, Ser: 0.82 mg/dL (ref 0.40–1.50)
GFR: 88.07 mL/min (ref >60.00)
Glucose, Bld: 95 mg/dL (ref 70–99)
Potassium: 4.6 meq/L (ref 3.5–5.1)
Sodium: 137 meq/L (ref 135–145)

## 2023-10-01 LAB — URINALYSIS, ROUTINE W REFLEX MICROSCOPIC
Bilirubin Urine: NEGATIVE
Hgb urine dipstick: NEGATIVE
Ketones, ur: NEGATIVE
Leukocytes,Ua: NEGATIVE
Nitrite: NEGATIVE
RBC / HPF: NONE SEEN (ref 0–?)
Specific Gravity, Urine: <=1.005 — AB (ref 1.000–1.030)
Total Protein, Urine: NEGATIVE
Urine Glucose: NEGATIVE
Urobilinogen, UA: 0.2 (ref 0.0–1.0)
WBC, UA: NONE SEEN (ref 0–?)
pH: 6 (ref 5.0–8.0)

## 2023-10-01 LAB — CBC WITH DIFFERENTIAL/PLATELET
Basophils Absolute: 0 10*3/uL (ref 0.0–0.1)
Basophils Relative: 0.6 % (ref 0.0–3.0)
Eosinophils Absolute: 0.1 10*3/uL (ref 0.0–0.7)
Eosinophils Relative: 1.9 % (ref 0.0–5.0)
HCT: 45.6 % (ref 39.0–52.0)
Hemoglobin: 15.4 g/dL (ref 13.0–17.0)
Lymphocytes Relative: 28.4 % (ref 12.0–46.0)
Lymphs Abs: 2 10*3/uL (ref 0.7–4.0)
MCHC: 33.8 g/dL (ref 30.0–36.0)
MCV: 92.6 fl (ref 78.0–100.0)
Monocytes Absolute: 0.9 10*3/uL (ref 0.1–1.0)
Monocytes Relative: 12.4 % — ABNORMAL HIGH (ref 3.0–12.0)
Neutro Abs: 3.9 10*3/uL (ref 1.4–7.7)
Neutrophils Relative %: 56.7 % (ref 43.0–77.0)
Platelets: 269 10*3/uL (ref 150.0–400.0)
RBC: 4.93 Mil/uL (ref 4.22–5.81)
RDW: 12.8 % (ref 11.5–15.5)
WBC: 6.9 10*3/uL (ref 4.0–10.5)

## 2023-10-01 LAB — LIPID PANEL
Cholesterol: 189 mg/dL (ref 0–200)
HDL: 45.4 mg/dL (ref >39.00)
LDL Cholesterol: 120 mg/dL — ABNORMAL HIGH (ref 0–99)
NonHDL: 143.48
Total CHOL/HDL Ratio: 4
Triglycerides: 118 mg/dL (ref 0.0–149.0)
VLDL: 23.6 mg/dL (ref 0.0–40.0)

## 2023-10-01 LAB — HEPATIC FUNCTION PANEL
ALT: 12 U/L (ref 0–53)
AST: 16 U/L (ref 0–37)
Albumin: 4.5 g/dL (ref 3.5–5.2)
Alkaline Phosphatase: 84 U/L (ref 39–117)
Bilirubin, Direct: 0.1 mg/dL (ref 0.0–0.3)
Total Bilirubin: 0.8 mg/dL (ref 0.2–1.2)
Total Protein: 6.8 g/dL (ref 6.0–8.3)

## 2023-10-01 LAB — TSH: TSH: 0.03 u[IU]/mL — ABNORMAL LOW (ref 0.35–5.50)

## 2023-10-01 LAB — PSA: PSA: 1.33 ng/mL (ref 0.10–4.00)

## 2023-10-01 LAB — VITAMIN D 25 HYDROXY (VIT D DEFICIENCY, FRACTURES): VITD: 34.16 ng/mL (ref 30.00–100.00)

## 2023-10-01 LAB — VITAMIN B12: Vitamin B-12: 656 pg/mL (ref 211–911)

## 2023-10-01 MED ORDER — LEVOTHYROXINE SODIUM 175 MCG PO TABS
175.0000 ug | ORAL_TABLET | Freq: Every day | ORAL | 3 refills | Status: AC
Start: 2023-10-01 — End: ?

## 2023-10-01 MED ORDER — REPATHA SURECLICK 140 MG/ML ~~LOC~~ SOAJ: 140.0000 mg | SUBCUTANEOUS | 3 refills | Status: DC

## 2023-10-01 NOTE — Telephone Encounter (Signed)
 Pharmacy Patient Advocate Encounter  Received notification from [facility] that Prior Authorization for REPATHA  SURECLICK 140MG /ML has been APPROVED from 06/12/2023 to 06/10/2024. Ran test claim, Copay is $432.88. This test claim was processed through [facility]- copay amounts may vary at other pharmacies due to pharmacy/plan contracts, or as the patient moves through the different stages of their insurance plan.   PA #/Case ID/Reference #: [id number]

## 2023-10-01 NOTE — Assessment & Plan Note (Signed)
 Lab Results  Component Value Date   LDLCALC 120 (H) 10/01/2023   Uncontrolled, for f/u lab today, cont zetia  10 every other day, but also add repatha  140 mg biweekly

## 2023-10-01 NOTE — Assessment & Plan Note (Signed)
 Mild to mod worsening, for add Nasacort otc prn,  to f/u any worsening symptoms or concerns

## 2023-10-01 NOTE — Telephone Encounter (Signed)
 Pharmacy Patient Advocate Encounter   Received notification from CoverMyMeds that prior authorization for Repatha  SureClick 140MG /ML auto-injectors is required/requested.   Insurance verification completed.   The patient is insured through [facility] .   Per test claim: PA required; PA submitted to above mentioned insurance via CoverMyMeds Key/confirmation #/EOC  [facility] Status is pending

## 2023-10-01 NOTE — Assessment & Plan Note (Signed)
 Lab Results  Component Value Date   VITAMINB12 656 10/01/2023   Stable, cont oral replacement - b12 1000 mcg qd

## 2023-10-01 NOTE — Assessment & Plan Note (Signed)
 Lab Results  Component Value Date   HGBA1C 6.0 10/01/2023   Stable, pt to continue current medical treatment  - diet, wt control

## 2023-10-01 NOTE — Assessment & Plan Note (Signed)
Age and sex appropriate education and counseling updated with regular exercise and diet Referrals for preventative services - none needed Immunizations addressed - for shingrx at pharmacy Smoking counseling  - none needed Evidence for depression or other mood disorder - none significant Most recent labs reviewed. I have personally reviewed and have noted: 1) the patient's medical and social history 2) The patient's current medications and supplements 3) The patient's height, weight, and BMI have been recorded in the chart

## 2023-10-01 NOTE — Assessment & Plan Note (Signed)
Also for psa with labs 
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
## 2023-10-01 NOTE — Patient Instructions (Addendum)
 Please have your Shingrix (shingles) shots done at your local pharmacy.  Please take all new medication as prescribed  - the repatha  for cholesterol  Please continue all other medications as before, including the zetia  every other day as you are doing  Please have the pharmacy call with any other refills you may need.  Please continue your efforts at being more active, low cholesterol diet, and weight control.  You are otherwise up to date with prevention measures today.  Please keep your appointments with your specialists as you may have planned  Please go to the LAB at the blood drawing area for the tests to be done  You will be contacted by phone if any changes need to be made immediately.  Otherwise, you will receive a letter about your results with an explanation, but please check with MyChart first.  Please make an Appointment to return in 6 months, or sooner if needed

## 2023-10-01 NOTE — Assessment & Plan Note (Signed)
 Last vitamin D  Lab Results  Component Value Date   VD25OH 34.16 10/01/2023   Low, to start oral replacement

## 2023-11-05 ENCOUNTER — Encounter (HOSPITAL_BASED_OUTPATIENT_CLINIC_OR_DEPARTMENT_OTHER): Payer: Self-pay | Admitting: Adult Health

## 2023-11-05 ENCOUNTER — Ambulatory Visit (HOSPITAL_BASED_OUTPATIENT_CLINIC_OR_DEPARTMENT_OTHER): Payer: Medicare HMO | Admitting: Adult Health

## 2023-11-05 ENCOUNTER — Ambulatory Visit (HOSPITAL_BASED_OUTPATIENT_CLINIC_OR_DEPARTMENT_OTHER)

## 2023-11-05 VITALS — BP 129/95 | HR 52 | Ht 68.0 in | Wt 167.3 lb

## 2023-11-05 DIAGNOSIS — J61 Pneumoconiosis due to asbestos and other mineral fibers: Secondary | ICD-10-CM | POA: Diagnosis not present

## 2023-11-05 DIAGNOSIS — Z87891 Personal history of nicotine dependence: Secondary | ICD-10-CM | POA: Diagnosis not present

## 2023-11-05 DIAGNOSIS — J449 Chronic obstructive pulmonary disease, unspecified: Secondary | ICD-10-CM

## 2023-11-05 DIAGNOSIS — M545 Low back pain, unspecified: Secondary | ICD-10-CM

## 2023-11-05 NOTE — Assessment & Plan Note (Signed)
 Mid to lower back pain questionable etiology.  Seems to be self-limiting and improving.  Advised to use ice or heat.  Check chest x-ray today.  If symptoms or not improving follow-up with primary care.

## 2023-11-05 NOTE — Progress Notes (Signed)
 @Patient  ID: Guy Morris, male    DOB: [birth year], 73 y.o.   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  PFTs in July 02, 2022 showed mild COPD with FEV1 at 86%, ratio 62, FVC 102%.,  No significant bronchodilator esponse, DLCO 93%.  11/05/2023 Follow up : COPD with Emphysema  Patient presents for 88-month follow-up.  Patient has mild COPD with emphysema.  Abnormal CT chest with pleural plaques from presumed asbestos exposure.  Patient is on maintenance regimen with Wixela twice daily.  Previously been tried on Symbicort  but was not affordable due to insurance.  Patient says he is doing well on Wixela.  Denies any flare of cough or wheezing.  Says he remains very active.  Typically getting 10,000 steps each day.  He denies any increased albuterol  use. Says he does not like to work out and hiking.  Has no significant shortness of breath at rest.  Says over the last month felt like he might of pulled a muscle had some tightness in his mid to lower back.  Over the last week or so this has gotten significantly better.  Denies any hemoptysis, chest pain, orthopnea or edema.  Denies any urinary symptoms, radicular symptoms or leg weakness.  Denies fall.     Allergies  Allergen Reactions   Atorvastatin     myalgia   Claritin [Loratadine]     dizzy   Rofecoxib Itching   Rosuvastatin     myalgia   Zocor  [Simvastatin ] Other (See Comments)    myalgia    Immunization History  Administered Date(s) Administered   Influenza Split 04/23/2011, 04/28/2012   Influenza Whole  03/12/2007, 04/18/2010   Influenza, High Dose Seasonal PF 03/02/2021   Influenza,inj,Quad PF,6+ Mos 04/30/2013, 04/29/2014, 05/03/2015, 05/08/2016   Influenza-Unspecified 05/13/2017, 03/26/2018, 04/06/2019, 04/05/2020, 04/04/2023   Janssen (J&J) SARS-COV-2 Vaccination 09/16/2019   Pneumococcal Conjugate-13 11/06/2016   Pneumococcal Polysaccharide-23 11/05/2017   Respiratory Syncytial Virus Vaccine,Recomb Aduvanted(Arexvy) 06/24/2023   Td 04/11/1994, 09/13/2008   Tdap 08/14/2018    Past Medical History:  Diagnosis Date   ABNORMAL TRANSAMINASE-LFT'S 08/04/2008   Qualifier: Diagnosis of  By: Sandrea Cruel MD Sung Engels T    ALLERGIC RHINITIS 05/06/2007   Qualifier: Diagnosis of  By: Autry Legions MD, Alveda Aures    Allergy    Anginal pain (HCC)    Intermittent. Takes Nitro PRN   Atrial fibrillation (HCC)    BPH (benign prostatic hyperplasia) 04/28/2012   COPD (chronic obstructive pulmonary disease) (HCC)    Coronary artery disease with history of myocardial infarction without history of CABG 04/11/2021   Degenerative joint disease 04/23/2011   Dysrhythmia    Hx of A.Fib   ERECTILE DYSFUNCTION 05/06/2007   Qualifier: Diagnosis of  By: Autry Legions MD, Alveda Aures    GERD 05/06/2007   Qualifier: Diagnosis of  By: Autry Legions MD, Alveda Aures    HYPERLIPIDEMIA 05/06/2007   Qualifier: Diagnosis of  By: Autry Legions MD, Alveda Aures    Hypertension    HYPOTHYROIDISM 05/06/2007   Qualifier: Diagnosis of  By: Autry Legions MD, Alveda Aures    Insomnia 04/28/2012  Myocardial infarction (HCC)    OSTEOARTHRITIS, KNEE, 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   Qualifier: Diagnosis of  By: Autry Legions MD, Alveda Aures    Peripheral vascular disease [facility])    Dr. Talitha Faden   Personal history of colonic polyps 07/20/2008   Centricity Description: PERSONAL HX COLONIC POLYPS Qualifier: Diagnosis of  By: Howard Macho MD, Arleen Lacer  Centricity Description: COLONIC POLYPS, HX OF Qualifier: Diagnosis of  By: Autry Legions MD, Alveda Aures    Pneumonia    Hx   RLS (restless legs syndrome) 10/17/2010   Urge  incontinence 04/28/2012   vesicare  heps per urology    Tobacco History: Social History   Tobacco Use  Smoking Status Former   Current packs/day: 0.00   Average packs/day: 2.0 packs/day for 37.0 years (74.0 ttl pk-yrs)   Types: Cigarettes   Start date: 06/12/1963   Quit date: 06/11/2000   Years since quitting: 23.4   Passive exposure: Past  Smokeless Tobacco Never  Tobacco Comments   quit smoking cigarettes 2002 and quit cigars 2016   Counseling given: Not Answered Tobacco comments: quit smoking cigarettes 2002 and quit cigars 2016   Outpatient Medications Prior to Visit  Medication Sig Dispense Refill   ascorbic acid (VITAMIN C) 1000 MG tablet Take 1,000 mg by mouth daily.     aspirin  EC 81 MG tablet Take 1 tablet (81 mg total) by mouth daily. Swallow whole. 30 tablet 11   Ca Carbonate-Mag Hydroxide (ROLAIDS PO) Take 1 tablet by mouth 3 (three) times daily as needed (heartburn).     cholecalciferol (VITAMIN D3) 25 MCG (1000 UNIT) tablet Take 1,000 Units by mouth daily.     cyclobenzaprine  (FLEXERIL ) 10 MG tablet Take 1 tablet (10 mg total) by mouth 3 (three) times daily as needed. for muscle spams (Patient taking differently: Take 10 mg by mouth at bedtime.) 30 tablet 5   ezetimibe  (ZETIA ) 10 MG tablet Take 1 tablet (10 mg total) by mouth daily. 90 tablet 3   fluticasone -salmeterol (WIXELA INHUB) 250-50 MCG/ACT AEPB Inhale 1 puff into the lungs in the morning and at bedtime. 180 each 3   levothyroxine  (SYNTHROID ) 175 MCG tablet Take 1 tablet (175 mcg total) by mouth daily. 90 tablet 3   meclizine  (ANTIVERT ) 12.5 MG tablet TAKE 1 TABLET BY MOUTH THREE TIMES DAILY AS NEEDED FOR DIZZINESS 30 tablet 0   Menthol , Topical Analgesic, (ZIMS MAX-FREEZE) 3.7 % GEL Apply 1 application  topically daily as needed (pain).     metoprolol  tartrate (LOPRESSOR ) 50 MG tablet Take 1/2 (one-half) tablet by mouth twice daily 90 tablet 3   omeprazole  (PRILOSEC) 20 MG capsule Take 20 mg by mouth daily.      pregabalin (LYRICA) 50 MG capsule Take 50 mg by mouth daily.     traMADol  (ULTRAM ) 50 MG tablet Take 1 tablet (50 mg total) by mouth every 6 (six) hours as needed. 30 tablet 0   Turmeric Curcumin 500 MG CAPS Take 1,000 mg by mouth daily.     vitamin B-12 (CYANOCOBALAMIN ) 500 MCG tablet Take 500 mcg by mouth daily.     ABRYSVO 120 MCG/0.5ML injection      acetaminophen  (TYLENOL ) 500 MG tablet Take 1,000 mg by mouth daily.     Evolocumab  (REPATHA  SURECLICK) 140 MG/ML SOAJ Inject 140 mg into the skin every 14 (fourteen) days. 6 mL 3   nitroGLYCERIN  (NITROSTAT ) 0.4 MG SL tablet Place 1 tablet (0.4 mg total) under the tongue every 5 (five) minutes as needed for chest pain. 90 tablet  3   No facility-administered medications prior to visit.     Review of Systems:   Constitutional:   No  weight loss, night sweats,  Fevers, chills, +fatigue, or  lassitude.  HEENT:   No headaches,  Difficulty swallowing,  Tooth/dental problems, or  Sore throat,                No sneezing, itching, ear ache, nasal congestion, post nasal drip,   CV:  No chest pain,  Orthopnea, PND, swelling in lower extremities, anasarca, dizziness, palpitations, syncope.   GI  No heartburn, indigestion, abdominal pain, nausea, vomiting, diarrhea, change in bowel habits, loss of appetite, bloody stools.   Resp: No shortness of breath with exertion or at rest.  No excess mucus, no productive cough,  No non-productive cough,  No coughing up of blood.  No change in color of mucus.  No wheezing.  No chest wall deformity  Skin: no rash or lesions.  GU: no dysuria, change in color of urine, no urgency or frequency.  No flank pain, no hematuria   MS:  No joint pain or swelling.  No decreased range of motion. +Back pain.    Physical Exam  BP (!) 129/95   Pulse (!) 52   Ht 5\' 8"  (1.727 m)   Wt 167 lb 4.8 oz (75.9 kg)   SpO2 97%   BMI 25.44 kg/m   GEN: A/Ox3; pleasant , NAD, well nourished    HEENT:  [place]/AT,  NOSE-clear,  THROAT-clear, no lesions, no postnasal drip or exudate noted.   NECK:  Supple w/ fair ROM; no JVD; normal carotid impulses w/o bruits; no thyromegaly or nodules palpated; no lymphadenopathy.    RESP  Clear  P & A; w/o, wheezes/ rales/ or rhonchi. no accessory muscle use, no dullness to percussion  CARD:  RRR, no m/r/g, no peripheral edema, pulses intact, no cyanosis or clubbing.  GI:   Soft & nt; nml bowel sounds; no organomegaly or masses detected.   Musco: Warm bil, no deformities or joint swelling noted.   Neuro: alert, no focal deficits noted.    Skin: Warm, no lesions or rashes    Lab Results:  CBC    Component Value Date/Time   WBC 6.9 10/01/2023 0924   RBC 4.93 10/01/2023 0924   HGB 15.4 10/01/2023 0924   HGB 15.7 07/03/2023 1257   HCT 45.6 10/01/2023 0924   HCT 47.7 07/03/2023 1257   PLT 269.0 10/01/2023 0924   PLT 294 07/03/2023 1257   MCV 92.6 10/01/2023 0924   MCV 92 07/03/2023 1257   MCH 30.3 07/03/2023 1257   MCH 31.4 08/29/2022 0900   MCHC 33.8 10/01/2023 0924   RDW 12.8 10/01/2023 0924   RDW 12.3 07/03/2023 1257   LYMPHSABS 2.0 10/01/2023 0924   LYMPHSABS 1.8 04/21/2020 1142   MONOABS 0.9 10/01/2023 0924   EOSABS 0.1 10/01/2023 0924   EOSABS 0.1 04/21/2020 1142   BASOSABS 0.0 10/01/2023 0924   BASOSABS 0.1 04/21/2020 1142    BMET    Component Value Date/Time   NA 137 10/01/2023 0924   NA 140 07/03/2023 1257   K 4.6 10/01/2023 0924   CL 103 10/01/2023 0924   CO2 26 10/01/2023 0924   GLUCOSE 95 10/01/2023 0924   BUN 16 10/01/2023 0924   BUN 10 07/03/2023 1257   CREATININE 0.82 10/01/2023 0924   CREATININE 0.76 02/18/2020 0927   CALCIUM  10.0 10/01/2023 0924   GFRNONAA >60 08/29/2022 0900   GFRNONAA 94  02/18/2020 0927   GFRAA 113 04/21/2020 1142   GFRAA 109 02/18/2020 0927    BNP No results found for: "BNP"  ProBNP No results found for: "PROBNP"  Imaging: No results found.  Administration History     None          Latest  Ref Rng & Units 07/02/2022    2:51 PM 04/25/2020    1:47 PM  PFT Results  FVC-Pre L 3.62  4.09   FVC-Predicted Pre % 92  98   FVC-Post L 4.01    FVC-Predicted Post % 102    Pre FEV1/FVC % % 64  66   Post FEV1/FCV % % 62    FEV1-Pre L 2.31  2.71   FEV1-Predicted Pre % 80  88   FEV1-Post L 2.51    DLCO uncorrected ml/min/mmHg 22.15    DLCO UNC% % 93    DLCO corrected ml/min/mmHg 22.15    DLCO COR %Predicted % 93    DLVA Predicted % 101    TLC L 7.55    TLC % Predicted % 117    RV % Predicted % 168      No results found for: "NITRICOXIDE"      Assessment & Plan:   COPD (chronic obstructive pulmonary disease) (HCC) Mild COPD with emphysema currently under good control.  Continue on Plavix, twice daily.  Inhaler oral care discussed  Chest x-ray today.  Plan  Patient Instructions  Continue on Wixela 1 puff Twice daily, rinse after use.   Albuterol  inhaler As needed   Chest xray today .  Activity as tolerated.  Follow up with Dr. Washington Hacker in 4-6 months and As needed            Asbestosis [facility]) Previous history of asbestos with stable pleural plaques on high-resolution CT chest 2024.  No evidence of interstitial lung disease.  LOW BACK PAIN Mid to lower back pain questionable etiology.  Seems to be self-limiting and improving.  Advised to use ice or heat.  Check chest x-ray today.  If symptoms or not improving follow-up with primary care.     Roena Clark, NP 11/05/2023

## 2023-11-05 NOTE — Assessment & Plan Note (Signed)
 Previous history of asbestos with stable pleural plaques on high-resolution CT chest 2024.  No evidence of interstitial lung disease.

## 2023-11-05 NOTE — Patient Instructions (Addendum)
 Continue on Wixela 1 puff Twice daily, rinse after use.   Albuterol  inhaler As needed   Chest xray today .  Activity as tolerated.  Follow up with Dr. Washington Hacker in 4-6 months and As needed

## 2023-11-05 NOTE — Assessment & Plan Note (Signed)
 Mild COPD with emphysema currently under good control.  Continue on Plavix, twice daily.  Inhaler oral care discussed  Chest x-ray today.  Plan  Patient Instructions  Continue on Wixela 1 puff Twice daily, rinse after use.   Albuterol  inhaler As needed   Chest xray today .  Activity as tolerated.  Follow up with Dr. Washington Hacker in 4-6 months and As needed

## 2023-11-06 DIAGNOSIS — M4322 Fusion of spine, cervical region: Secondary | ICD-10-CM | POA: Diagnosis not present

## 2023-11-06 DIAGNOSIS — G894 Chronic pain syndrome: Secondary | ICD-10-CM | POA: Diagnosis not present

## 2023-11-06 DIAGNOSIS — M4722 Other spondylosis with radiculopathy, cervical region: Secondary | ICD-10-CM | POA: Diagnosis not present

## 2023-11-18 ENCOUNTER — Ambulatory Visit: Payer: Self-pay | Admitting: Adult Health

## 2023-11-18 ENCOUNTER — Other Ambulatory Visit: Payer: Self-pay | Admitting: Internal Medicine

## 2023-11-19 ENCOUNTER — Other Ambulatory Visit: Payer: Self-pay

## 2023-12-26 ENCOUNTER — Other Ambulatory Visit: Payer: Self-pay | Admitting: Internal Medicine

## 2023-12-27 ENCOUNTER — Other Ambulatory Visit: Payer: Self-pay | Admitting: Internal Medicine

## 2024-01-19 ENCOUNTER — Other Ambulatory Visit: Payer: Self-pay | Admitting: Internal Medicine

## 2024-02-11 ENCOUNTER — Other Ambulatory Visit: Payer: Self-pay | Admitting: Cardiology

## 2024-02-12 DIAGNOSIS — I739 Peripheral vascular disease, unspecified: Secondary | ICD-10-CM | POA: Insufficient documentation

## 2024-02-12 DIAGNOSIS — I25700 Atherosclerosis of coronary artery bypass graft(s), unspecified, with unstable angina pectoris: Secondary | ICD-10-CM | POA: Insufficient documentation

## 2024-02-12 DIAGNOSIS — I10 Essential (primary) hypertension: Secondary | ICD-10-CM | POA: Insufficient documentation

## 2024-02-12 NOTE — Progress Notes (Unsigned)
 Cardiology Office Note:   Date:  02/14/2024  ID:  Bolden, Hagerman 1951-04-03, MRN [id number] PCP: Norleen Lynwood ORN, MD  [facility] Providers Cardiologist:  Lynwood Schilling, MD {  History of Present Illness:   Guy Morris is a 73 y.o. male who is seen for atrial fib and CAD.   He converted spontaneously when he had atrial fib.   He presented in October  2021 with jaw pain and subsequently was found to have three vessel CAD.  He is status post CABG.   Since I last saw him he was seen by Dr. Barbaraann for evaluation prior to spinal surgery.  He had chest pain and atrial fib.  He had a negative perfusion study for ischemia.  There was inferolateral infarct.  Echo demonstrated an EF of 50%.  In January 2025 he had chest pain and he had cath.  He was found to have a patent LIMA but it was non functional because the flow was brisk in the native LAD.  The SVG to RCA and diag was occluded.  Circ was occluded but SVG to OM was patent.    He still has episodes of feeling weak and dizzy.  He might be out pushing a lawnmower and he just has to stop.  He thinks he goes into fibrillation and might last for an hour but he has not been able to record this on his [place].  I actually looked at some of the phone logs.  A couple of the episodes were too much artifact.  There was 1 episode where his heart rate was in the 60s but it appeared to be sinus bradycardia.  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  He is not getting any new chest discomfort where he requires nitroglycerin .  He is not having new shortness of breath of the periodically he gets winded.  He just feels like he has a harder time getting up and getting going.  He is stiffer when he gets up and moves around.  He wonders if some of this could be aging.  ROS: As stated in the HPI and negative for all other systems.  Studies Reviewed:    EKG:         Risk Assessment/Calculations:    CHA2DS2-VASc Score = 3  \ This indicates a 3.2% annual risk of  stroke. The patient's score is based upon: CHF History: 0 HTN History: 1 Diabetes History: 0 Stroke History: 0 Vascular Disease History: 1 Age Score: 1 Gender Score: 0   Physical Exam:   VS:  BP 124/72   Pulse (!) 53   Ht 5' 8 (1.727 m)   Wt 166 lb 9.6 oz (75.6 kg)   SpO2 98%   BMI 25.33 kg/m    Wt Readings from Last 3 Encounters:  02/14/24 166 lb 9.6 oz (75.6 kg)  11/05/23 167 lb 4.8 oz (75.9 kg)  10/01/23 167 lb (75.8 kg)     GEN: Well nourished, well developed in no acute distress NECK: No JVD; No carotid bruits CARDIAC: RRR, no murmurs, rubs, gallops RESPIRATORY:  Clear to auscultation without rales, wheezing or rhonchi  ABDOMEN: Soft, non-tender, non-distended EXTREMITIES:  No edema; No deformity   ASSESSMENT AND PLAN:   Coronary artery disease/CABG:   I went through the anatomy with him and showed him the illustration.  Basically all of his myocardium is perfused either through his native vessels or grafts.  He should continue with risk reduction but no further  imaging is indicated.    Carotid stenosis:   He had 40 to 59% left stenosis.  He needs follow-up Doppler in November of this year and I will arrange that.   PAD (peripheral artery disease) (HCC): He has a left SFA occluded.  We are pursuing risk reduction.   Dyslipidemia: He did not tolerate a higher dose of statins in the past.  He is now on Repatha .  I will check a lipid profile.  I do not see an LP(a).  This should be ordered as well.    Hypertension: His blood pressure is at target.  No change in therapy.  Atrial fibrillation: He has had atrial appendage occlusion.   I am not really able to document prolonged fibrillation.  I think some of his symptoms are not related to that.  He wore a monitor earlier this year.  I reviewed his infrequent recordings on his phone and I do not see clear atrial fibrillation.  No plans for DOAC.  No plans for rhythm control.  I like him to try to keep tracking this.  He has  more symptoms and recording so he could use the device more frequently.  We had this conversation.   Follow up with me in 1 year or sooner if he has increased palpitations.  Signed, Lynwood Schilling, MD

## 2024-02-14 ENCOUNTER — Encounter: Payer: Self-pay | Admitting: Cardiology

## 2024-02-14 ENCOUNTER — Ambulatory Visit: Attending: Cardiology | Admitting: Cardiology

## 2024-02-14 VITALS — BP 124/72 | HR 53 | Ht 68.0 in | Wt 166.6 lb

## 2024-02-14 DIAGNOSIS — I6523 Occlusion and stenosis of bilateral carotid arteries: Secondary | ICD-10-CM

## 2024-02-14 DIAGNOSIS — I10 Essential (primary) hypertension: Secondary | ICD-10-CM

## 2024-02-14 DIAGNOSIS — I739 Peripheral vascular disease, unspecified: Secondary | ICD-10-CM | POA: Diagnosis not present

## 2024-02-14 DIAGNOSIS — I25700 Atherosclerosis of coronary artery bypass graft(s), unspecified, with unstable angina pectoris: Secondary | ICD-10-CM | POA: Diagnosis not present

## 2024-02-14 DIAGNOSIS — E785 Hyperlipidemia, unspecified: Secondary | ICD-10-CM

## 2024-02-14 NOTE — Patient Instructions (Addendum)
 Medication Instructions:  Your physician recommends that you continue on your current medications as directed. Please refer to the Current Medication list given to you today.  *If you need a refill on your cardiac medications before your next appointment, please call your pharmacy*  Lab Work: Fasting Lipid Panel and Lp(a) at your earliest convenience If you have labs (blood work) drawn today and your tests are completely normal, you will receive your results only by: MyChart Message (if you have MyChart) OR A paper copy in the mail If you have any lab test that is abnormal or we need to change your treatment, we will call you to review the results.  Testing/Procedures: Carotid Doppler in November   Follow-Up: At [facility], you and your health needs are our priority.  As part of our continuing mission to provide you with exceptional heart care, our providers are all part of one team.  This team includes your primary Cardiologist (physician) and Advanced Practice Providers or APPs (Physician Assistants and Nurse Practitioners) who all work together to provide you with the care you need, when you need it.  Your next appointment:   1 year  Provider:   Lavona, MD  We recommend signing up for the patient portal called MyChart.  Sign up information is provided on this After Visit Summary.  MyChart is used to connect with patients for Virtual Visits (Telemedicine).  Patients are able to view lab/test results, encounter notes, upcoming appointments, etc.  Non-urgent messages can be sent to your provider as well.   To learn more about what you can do with MyChart, go to [email or web address].

## 2024-02-17 DIAGNOSIS — E785 Hyperlipidemia, unspecified: Secondary | ICD-10-CM | POA: Diagnosis not present

## 2024-02-18 LAB — LIPID PANEL
Chol/HDL Ratio: 4.8 ratio (ref 0.0–5.0)
Cholesterol, Total: 214 mg/dL — ABNORMAL HIGH (ref 100–199)
HDL: 45 mg/dL (ref >39)
LDL Chol Calc (NIH): 126 mg/dL — ABNORMAL HIGH (ref 0–99)
Triglycerides: 242 mg/dL — ABNORMAL HIGH (ref 0–149)
VLDL Cholesterol Cal: 43 mg/dL — ABNORMAL HIGH (ref 5–40)

## 2024-02-18 LAB — LIPOPROTEIN A (LPA): Lipoprotein (a): 9.8 nmol/L (ref <75.0)

## 2024-02-19 ENCOUNTER — Ambulatory Visit: Payer: Self-pay | Admitting: Cardiology

## 2024-02-19 DIAGNOSIS — E785 Hyperlipidemia, unspecified: Secondary | ICD-10-CM

## 2024-02-20 NOTE — Telephone Encounter (Signed)
 Spoke with pt regarding his results. Pt stated he is taking Zetia  by could not afford Repatha . Pt was told this information would be sent to Dr. Lavona for his suggestions. Pt verbalized understanding. All questions if any were answered.

## 2024-02-20 NOTE — Telephone Encounter (Signed)
-----   Message from Lynwood Schilling sent at 02/19/2024  8:41 AM EDT ----- Please make sure that he is taking his evolocumab .  He should also be on Zetia .  His LDL is still elevated.  If he is taking these medicines lets send him to the Pharm.[facility]. lipid clinic to see if he  would qualify for inclisiran.. ----- Message ----- From: Rebecka Memos Lab Results In Sent: 02/18/2024   6:38 AM EDT To: Lynwood Schilling, MD

## 2024-03-24 DIAGNOSIS — D216 Benign neoplasm of connective and other soft tissue of trunk, unspecified: Secondary | ICD-10-CM | POA: Diagnosis not present

## 2024-04-01 ENCOUNTER — Other Ambulatory Visit (HOSPITAL_COMMUNITY): Payer: Self-pay

## 2024-04-02 ENCOUNTER — Ambulatory Visit: Payer: Self-pay | Admitting: Internal Medicine

## 2024-04-02 ENCOUNTER — Ambulatory Visit: Admitting: Internal Medicine

## 2024-04-02 ENCOUNTER — Encounter: Payer: Self-pay | Admitting: Internal Medicine

## 2024-04-02 VITALS — BP 120/76 | HR 50 | Temp 97.7°F | Ht 68.0 in | Wt 165.0 lb

## 2024-04-02 DIAGNOSIS — E039 Hypothyroidism, unspecified: Secondary | ICD-10-CM

## 2024-04-02 DIAGNOSIS — R739 Hyperglycemia, unspecified: Secondary | ICD-10-CM | POA: Diagnosis not present

## 2024-04-02 DIAGNOSIS — R1084 Generalized abdominal pain: Secondary | ICD-10-CM

## 2024-04-02 DIAGNOSIS — E7800 Pure hypercholesterolemia, unspecified: Secondary | ICD-10-CM

## 2024-04-02 DIAGNOSIS — I10 Essential (primary) hypertension: Secondary | ICD-10-CM | POA: Diagnosis not present

## 2024-04-02 DIAGNOSIS — E559 Vitamin D deficiency, unspecified: Secondary | ICD-10-CM | POA: Diagnosis not present

## 2024-04-02 DIAGNOSIS — E538 Deficiency of other specified B group vitamins: Secondary | ICD-10-CM

## 2024-04-02 DIAGNOSIS — Z23 Encounter for immunization: Secondary | ICD-10-CM

## 2024-04-02 LAB — URINALYSIS, ROUTINE W REFLEX MICROSCOPIC
Bilirubin Urine: NEGATIVE
Hgb urine dipstick: NEGATIVE
Ketones, ur: NEGATIVE
Leukocytes,Ua: NEGATIVE
Nitrite: NEGATIVE
RBC / HPF: NONE SEEN (ref 0–?)
Specific Gravity, Urine: <=1.005 — AB (ref 1.000–1.030)
Total Protein, Urine: NEGATIVE
Urine Glucose: NEGATIVE
Urobilinogen, UA: 0.2 (ref 0.0–1.0)
WBC, UA: NONE SEEN (ref 0–?)
pH: 6 (ref 5.0–8.0)

## 2024-04-02 LAB — CBC WITH DIFFERENTIAL/PLATELET
Basophils Absolute: 0.1 K/uL (ref 0.0–0.1)
Basophils Relative: 0.8 % (ref 0.0–3.0)
Eosinophils Absolute: 0.1 K/uL (ref 0.0–0.7)
Eosinophils Relative: 2.2 % (ref 0.0–5.0)
HCT: 45.8 % (ref 39.0–52.0)
Hemoglobin: 15.1 g/dL (ref 13.0–17.0)
Lymphocytes Relative: 25.5 % (ref 12.0–46.0)
Lymphs Abs: 1.7 K/uL (ref 0.7–4.0)
MCHC: 32.9 g/dL (ref 30.0–36.0)
MCV: 93.7 fl (ref 78.0–100.0)
Monocytes Absolute: 0.7 K/uL (ref 0.1–1.0)
Monocytes Relative: 10.8 % (ref 3.0–12.0)
Neutro Abs: 4 K/uL (ref 1.4–7.7)
Neutrophils Relative %: 60.7 % (ref 43.0–77.0)
Platelets: 277 K/uL (ref 150.0–400.0)
RBC: 4.89 Mil/uL (ref 4.22–5.81)
RDW: 13 % (ref 11.5–15.5)
WBC: 6.7 K/uL (ref 4.0–10.5)

## 2024-04-02 LAB — LIPID PANEL
Cholesterol: 95 mg/dL (ref 0–200)
HDL: 52.6 mg/dL (ref >39.00)
LDL Cholesterol: 20 mg/dL (ref 0–99)
NonHDL: 42.53
Total CHOL/HDL Ratio: 2
Triglycerides: 114 mg/dL (ref 0.0–149.0)
VLDL: 22.8 mg/dL (ref 0.0–40.0)

## 2024-04-02 LAB — BASIC METABOLIC PANEL WITH GFR
BUN: 10 mg/dL (ref 6–23)
CO2: 26 meq/L (ref 19–32)
Calcium: 9.6 mg/dL (ref 8.4–10.5)
Chloride: 103 meq/L (ref 96–112)
Creatinine, Ser: 0.75 mg/dL (ref 0.40–1.50)
GFR: 90.15 mL/min (ref >60.00)
Glucose, Bld: 100 mg/dL — ABNORMAL HIGH (ref 70–99)
Potassium: 4.4 meq/L (ref 3.5–5.1)
Sodium: 138 meq/L (ref 135–145)

## 2024-04-02 LAB — HEPATIC FUNCTION PANEL
ALT: 12 U/L (ref 0–53)
AST: 15 U/L (ref 0–37)
Albumin: 4.5 g/dL (ref 3.5–5.2)
Alkaline Phosphatase: 86 U/L (ref 39–117)
Bilirubin, Direct: 0.2 mg/dL (ref 0.0–0.3)
Total Bilirubin: 0.5 mg/dL (ref 0.2–1.2)
Total Protein: 6.9 g/dL (ref 6.0–8.3)

## 2024-04-02 LAB — THYROID PANEL WITH TSH
Free Thyroxine Index: 4 — ABNORMAL HIGH (ref 1.4–3.8)
T3 Uptake: 34 % (ref 22–35)
T4, Total: 11.8 ug/dL — ABNORMAL HIGH (ref 4.9–10.5)
TSH: 0.51 m[IU]/L (ref 0.40–4.50)

## 2024-04-02 LAB — LIPASE: Lipase: 43 U/L (ref 11.0–59.0)

## 2024-04-02 NOTE — Progress Notes (Signed)
 The test results show that your current treatment is OK, as the tests are stable.  Please continue the same plan.  There is no other need for change of treatment or further evaluation based on these results, at this time.  thanks
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
## 2024-04-02 NOTE — Patient Instructions (Addendum)
 Please have your Shingrix (shingles) shots done at your local pharmacy.  Please continue all other medications as before, and refills have been done if requested.  Please have the pharmacy call with any other refills you may need.  Please continue your efforts at being more active, low cholesterol diet, and weight control.  Please keep your appointments with your specialists as you may have planned  You will be contacted regarding the referral for: Gastroenterology for post prandial pain  Please go to the LAB at the blood drawing area for the tests to be done  You will be contacted by phone if any changes need to be made immediately.  Otherwise, you will receive a letter about your results with an explanation, but please check with MyChart first.  Please make an Appointment to return in 6 months, or sooner if needed

## 2024-04-05 ENCOUNTER — Encounter: Payer: Self-pay | Admitting: Internal Medicine

## 2024-04-05 NOTE — Assessment & Plan Note (Signed)
 Lab Results  Component Value Date   VITAMINB12 656 10/01/2023   Stable, cont oral replacement - b12 1000 mcg qd

## 2024-04-05 NOTE — Assessment & Plan Note (Signed)
 Last vitamin D  Lab Results  Component Value Date   VD25OH 34.16 10/01/2023   Low, to start oral replacement

## 2024-04-05 NOTE — Assessment & Plan Note (Signed)
 BP Readings from Last 3 Encounters:  04/02/24 120/76  02/14/24 124/72  11/05/23 (!) 129/95   Stable, pt to continue medical treatment lopressor  25 bid

## 2024-04-05 NOTE — Assessment & Plan Note (Signed)
 Lab Results  Component Value Date   HGBA1C 6.0 10/01/2023   Stable, pt to continue current medical treatment  - diet, wt control

## 2024-04-05 NOTE — Assessment & Plan Note (Signed)
 Lab Results  Component Value Date   TSH 0.51 04/02/2024   Stable, pt to continue levothyroxine  175 mcg qd

## 2024-04-05 NOTE — Assessment & Plan Note (Signed)
 Lab Results  Component Value Date   LDLCALC 20 04/02/2024   Stable, pt to continue current statin zetia  10 mg qd

## 2024-04-05 NOTE — Assessment & Plan Note (Addendum)
 Etiology unclear with recurring pain for several wks, differential would include vascular insufficiency- intestinal angina,, for labs as ordered, refer GI

## 2024-04-15 ENCOUNTER — Ambulatory Visit (HOSPITAL_COMMUNITY)
Admission: RE | Admit: 2024-04-15 | Discharge: 2024-04-15 | Disposition: A | Discharge status: Home / Self Care | Source: Ambulatory Visit | Attending: Cardiology | Admitting: Cardiology

## 2024-04-15 DIAGNOSIS — I6523 Occlusion and stenosis of bilateral carotid arteries: Secondary | ICD-10-CM | POA: Diagnosis not present

## 2024-05-01 ENCOUNTER — Ambulatory Visit (HOSPITAL_BASED_OUTPATIENT_CLINIC_OR_DEPARTMENT_OTHER): Admitting: Pulmonary Disease

## 2024-05-01 ENCOUNTER — Encounter (HOSPITAL_BASED_OUTPATIENT_CLINIC_OR_DEPARTMENT_OTHER): Payer: Self-pay | Admitting: Pulmonary Disease

## 2024-05-01 VITALS — BP 125/80 | HR 54 | Temp 97.9°F | Ht 68.0 in | Wt 165.1 lb

## 2024-05-01 DIAGNOSIS — K219 Gastro-esophageal reflux disease without esophagitis: Secondary | ICD-10-CM | POA: Diagnosis not present

## 2024-05-01 DIAGNOSIS — J920 Pleural plaque with presence of asbestos: Secondary | ICD-10-CM | POA: Diagnosis not present

## 2024-05-01 DIAGNOSIS — E039 Hypothyroidism, unspecified: Secondary | ICD-10-CM | POA: Diagnosis not present

## 2024-05-01 DIAGNOSIS — E785 Hyperlipidemia, unspecified: Secondary | ICD-10-CM

## 2024-05-01 DIAGNOSIS — Z87891 Personal history of nicotine dependence: Secondary | ICD-10-CM

## 2024-05-01 DIAGNOSIS — Z0000 Encounter for general adult medical examination without abnormal findings: Secondary | ICD-10-CM

## 2024-05-01 DIAGNOSIS — J449 Chronic obstructive pulmonary disease, unspecified: Secondary | ICD-10-CM | POA: Diagnosis not present

## 2024-05-01 NOTE — Assessment & Plan Note (Signed)
 Breakthrough symptoms but overall well controlled --CONTINUE Wixela 250-50 inhaler  --Encourage regular exercise five days a walk up to 20-30 min daily. Uses his pedometer --Up to date vaccinations: influenza, pneumococcal, RSV. Covid declined

## 2024-05-01 NOTE — Patient Instructions (Signed)
 Encounter for general health examination --REFER to PCP at [facility]  Chronic obstructive pulmonary disease, unspecified COPD type (HCC) Breakthrough symptoms but overall well controlled --CONTINUE Wixela 250-50 inhaler  --Encourage regular exercise five days a walk up to 20-30 min daily. Uses his pedometer --Up to date vaccinations: influenza, pneumococcal, RSV. 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

## 2024-05-01 NOTE — Progress Notes (Unsigned)
 Subjective:   PATIENT ID: Guy Morris GENDER: male DOB: February 01, 1951, MRN: [id number]  Chief Complaint  Patient presents with   COPD    Reason for Visit: Follow-up  [name] is a 73 year old male former smoker with COPD, atrial fibrillation s/p ablation in 2021/22, HLD, GERD, BPH, hypothyroidism who presents for follow-up.  Initial consult He reports recent respiratory illness this has improved. He has had gradual shortness of breath in the last year. Wants to be active. Difficulty running errands. Walking shortness distances including going to doctor offices will tire him. Able to grocery shop if he stops and takes frequently breaks. Denies chronic coughing or wheezing. Had covid in 10/2019 and 06/2021.   08/02/22 He continues to have shortness of breath with exertion. However he now walks 8000 steps daily. Exercise does seem to be helping his endurance. Has not been able to pick up Anoro due to cost. Denies cough or wheezing. No exacerbations since our last visit requiring steroids or antibiotics.  10/11/22 Since our last visit he had ACDF of C3-4 in March. Reports symptoms are unchanged. Breathing is rough at times and triggered by pollen and during yardwork. Associated with chest tightness. Denies cough or wheezing. Has been using Wixela once a day. He enjoys being outdoors and taking care of his yard  and his breathing sometimes limits him. Waiting until he can get insurance for 2025 for more inhaler options.  03/04/23 Since our last visit he reports Advair but not consistently per wife. He takes allergy medicine during pollen season. Likes to be outdoors. Will get shortness of breath with activity during pollen season. Denies cough or wheezing. Walks daily. Walked 7000 steps daily  05/31/23 Since our last visit he reports that his oxygen has been in the low 90s. He reports compliant with Wixela. He reports coughing more and worsening shortness of breath in the last few days.  Symptoms worsen while laying down. No wheezing. Denies fevers and chills.  08/06/23 Since our last visit he reports he feels fairly. He is compliant with Wixela but reports hoarseness immediately after use. He feels he is short of breath and unsure if his inhaler is helping. He recently had a cardiac cath on 07/05/23 which demonstrated occluded grafts except for SVG to OM2 and LIMA to LAD however essentially nonfunctional due to brisk antegrade flow through native vessel.  05/01/24 Since our last visit he remained on Wixela due to high cost of Symbicort . He feels that his respiratory symptoms are stable with coughing while eating. Worsened with dairy and certain types of foods. His hoarseness still occurs with inhalers and gargles after use. Denies sputum production or wheezing. Denies exacerbations >12 months. He is averaging 8000-10,000 steps.   Social History: Former smoker. Quit 8 years ago in 2016. 74 pack-years Previously worked as an personnel officer x 30 years. Located at [employer], suspected asbestos exposure   Past Medical History:  Diagnosis Date   ABNORMAL TRANSAMINASE-LFT'S 08/04/2008   Qualifier: Diagnosis of  By: Aneita MD NOLIA Gist T    ALLERGIC RHINITIS 05/06/2007   Qualifier: Diagnosis of  By: Norleen MD, Lynwood ORN    Allergy    Anginal pain    Intermittent. Takes Nitro PRN   Atrial fibrillation (HCC)    BPH (benign prostatic hyperplasia) 04/28/2012   COPD (chronic obstructive pulmonary disease) (HCC)    Coronary artery disease with history of myocardial infarction without history of CABG 04/11/2021   Degenerative joint disease 04/23/2011  Dysrhythmia    Hx of A.Fib   ERECTILE DYSFUNCTION 05/06/2007   Qualifier: Diagnosis of  By: Norleen MD, Lynwood ORN    GERD 05/06/2007   Qualifier: Diagnosis of  By: Norleen MD, Lynwood ORN    HYPERLIPIDEMIA 05/06/2007   Qualifier: Diagnosis of  By: Norleen MD, Lynwood ORN    Hypertension    HYPOTHYROIDISM 05/06/2007   Qualifier: Diagnosis of  By:  Norleen MD, Lynwood ORN    Insomnia 04/28/2012   Myocardial infarction (HCC)    OSTEOARTHRITIS, KNEE, 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   Qualifier: Diagnosis of  By: Norleen MD, Lynwood ORN    Peripheral vascular disease    Dr. Oris Abide   Personal history of colonic polyps 07/20/2008   Centricity Description: PERSONAL HX COLONIC POLYPS Qualifier: Diagnosis of  By: Teressa MD, Toribio SQUIBB  Centricity Description: COLONIC POLYPS, HX OF Qualifier: Diagnosis of  By: Norleen MD, Lynwood ORN    Pneumonia    Hx   RLS (restless legs syndrome) 10/17/2010   Urge incontinence 04/28/2012   vesicare  heps per urology     Family History  Problem Relation Age of Onset   Cancer Mother        Breast Cancer   Arthritis Mother        RA   Cancer Father        Lung Cancer   Esophageal cancer Father    Pancreatic cancer Maternal Uncle    Stomach cancer Sister    Colon cancer Neg Hx    Colon polyps Neg Hx    Rectal cancer Neg Hx      Social History   Occupational History   Occupation: disabled - DJD knees, neck and shoulders - on SSI  Tobacco Use   Smoking status: Former    Current packs/day: 0.00    Average packs/day: 2.0 packs/day for 37.0 years (74.0 ttl pk-yrs)    Types: Cigarettes    Start date: 06/12/1963    Quit date: 06/11/2000    Years since quitting: 23.9    Passive exposure: Past   Smokeless tobacco: Never   Tobacco comments:    quit smoking cigarettes 2002 and quit cigars 2016  Vaping Use   Vaping status: Never Used  Substance and Sexual Activity   Alcohol use: No   Drug use: No   Sexual activity: Not Currently    Allergies  Allergen Reactions   Atorvastatin     myalgia   Claritin [Loratadine]     dizzy   Rofecoxib Itching   Rosuvastatin     myalgia   Zocor  [Simvastatin ] Other (See Comments)    myalgia     Outpatient Medications Prior to Visit  Medication Sig Dispense Refill   ascorbic acid (VITAMIN C) 1000 MG tablet Take 1,000 mg by mouth daily.     aspirin  EC 81 MG tablet Take 1 tablet (81 mg  total) by mouth daily. Swallow whole. 30 tablet 11   Ca Carbonate-Mag Hydroxide (ROLAIDS PO) Take 1 tablet by mouth 3 (three) times daily as needed (heartburn).     cholecalciferol (VITAMIN D3) 25 MCG (1000 UNIT) tablet Take 1,000 Units by mouth daily.     cyclobenzaprine  (FLEXERIL ) 10 MG tablet Take 1 tablet by mouth three times daily as needed for muscle spasm 60 tablet 2   diclofenac (VOLTAREN) 75 MG EC tablet Take 75 mg by mouth 2 (two) times daily as needed.     Evolocumab  (REPATHA  SURECLICK) 140 MG/ML SOAJ Inject 140 mg into the skin  every 14 (fourteen) days.     ezetimibe  (ZETIA ) 10 MG tablet Take 1 tablet (10 mg total) by mouth daily. 90 tablet 3   fluticasone -salmeterol (WIXELA INHUB) 250-50 MCG/ACT AEPB Inhale 1 puff into the lungs in the morning and at bedtime. 180 each 3   levothyroxine  (SYNTHROID ) 175 MCG tablet Take 1 tablet (175 mcg total) by mouth daily. 90 tablet 3   meclizine  (ANTIVERT ) 12.5 MG tablet TAKE 1 TABLET BY MOUTH THREE TIMES DAILY AS NEEDED FOR DIZZINESS 30 tablet 0   Menthol , Topical Analgesic, (ZIMS MAX-FREEZE) 3.7 % GEL Apply 1 application  topically daily as needed (pain).     metoprolol  tartrate (LOPRESSOR ) 50 MG tablet Take 1/2 (one-half) tablet by mouth twice daily 90 tablet 3   nitroGLYCERIN  (NITROSTAT ) 0.4 MG SL tablet DISSOLVE ONE TABLET UNDER THE TONGUE EVERY 5 MINUTES AS NEEDED FOR CHEST PAIN.  DO NOT EXCEED A TOTAL OF 3 DOSES IN 15 MINUTES 25 tablet 5   omeprazole  (PRILOSEC) 20 MG capsule Take 20 mg by mouth daily.     pregabalin (LYRICA) 50 MG capsule Take 50 mg by mouth daily.     Turmeric Curcumin 500 MG CAPS Take 1,000 mg by mouth daily.     vitamin B-12 (CYANOCOBALAMIN ) 500 MCG tablet Take 500 mcg by mouth daily.     No facility-administered medications prior to visit.    Review of Systems  Constitutional:  Negative for chills, diaphoresis, fever, malaise/fatigue and weight loss.  HENT:  Negative for congestion.   Respiratory:  Positive for cough  and sputum production. Negative for hemoptysis, shortness of breath and wheezing.   Cardiovascular:  Negative for chest pain, palpitations and leg swelling.     Objective:   Vitals:   05/01/24 0942  BP: 125/80  Pulse: (!) 54  Temp: 97.9 F (36.6 C)  SpO2: 95%  Weight: 165 lb 1.6 oz (74.9 kg)  Height: 5' 8 (1.727 m)  SpO2: 95 %  Physical Exam: General: Well-appearing, no acute distress HENT: [place], AT Eyes: EOMI, no scleral icterus Respiratory: ***Clear to auscultation bilaterally.  No crackles, wheezing or rales Cardiovascular: RRR, -M/R/G, no JVD Extremities:-Edema,-tenderness Neuro: AAO x4, CNII-XII grossly intact Psych: Normal mood, normal affect  Data Reviewed:  Imaging: CT A/P 06/29/21 - Lower lobes with normal parenchyma CXR 05/22/22 - Right lateral chest with increased nodularity. Pleural plaques. Subtle nodularity in left base CT Chest 07/17/22 - Bilateral pleural plaques, calcified. Emphysema, mild  PFT: 04/25/20 FVC 4.09 (98%) FEV1 2.71 (88%) Ratio 66  Interpretation: Mild obstructive defect based on GOLD's criteria  07/02/22 FVC 4.01 (102%) FEV1 2.51 (866%) Ratio 62  TLC 117% RV 168% DLCO 93% Interpretation: Mild obstructive defect with air trapping   Labs: CBC    Component Value Date/Time   WBC 6.7 04/02/2024 0911   RBC 4.89 04/02/2024 0911   HGB 15.1 04/02/2024 0911   HGB 15.7 07/03/2023 1257   HCT 45.8 04/02/2024 0911   HCT 47.7 07/03/2023 1257   PLT 277.0 04/02/2024 0911   PLT 294 07/03/2023 1257   MCV 93.7 04/02/2024 0911   MCV 92 07/03/2023 1257   MCH 30.3 07/03/2023 1257   MCH 31.4 08/29/2022 0900   MCHC 32.9 04/02/2024 0911   RDW 13.0 04/02/2024 0911   RDW 12.3 07/03/2023 1257   LYMPHSABS 1.7 04/02/2024 0911   LYMPHSABS 1.8 04/21/2020 1142   MONOABS 0.7 04/02/2024 0911   EOSABS 0.1 04/02/2024 0911   EOSABS 0.1 04/21/2020 1142   BASOSABS 0.1 04/02/2024 0911  BASOSABS 0.1 04/21/2020 1142   Absolute eos 09/12/21 - 100     Assessment  & Plan:   Discussion: 73 year old male former smoker with COPD, atrial fibrillation s/p ablation in 2021/22, HLD, GERD, BPH, hypothyroidism who presents for COPD follow-up. Mild breakthrough symptoms on Wixela but no limitations in activity. Intermittent hoarseness that he tolerates. Discussed clinical course and management of COPD/asthma including bronchodilator regimen, preventive care including vaccinations and action plan for exacerbation.    Assessment & Plan Encounter for general health examination --REFER to PCP at [facility] Chronic obstructive pulmonary disease, unspecified COPD type (HCC) Breakthrough symptoms but overall well controlled --CONTINUE Wixela 250-50 inhaler  --Encourage regular exercise five days a walk up to 20-30 min daily. Uses his pedometer --Up to date vaccinations: influenza, pneumococcal, RSV. 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 Stable from 2021-2024 --ORDER CT Chest without contrast 04/2025    Health Maintenance Immunization History  Administered Date(s) Administered   INFLUENZA, HIGH DOSE SEASONAL PF 03/02/2021, 04/04/2024   Influenza Split 04/23/2011, 04/28/2012   Influenza Whole 03/12/2007, 04/18/2010   Influenza,inj,Quad PF,6+ Mos 04/30/2013, 04/29/2014, 05/03/2015, 05/08/2016   Influenza-Unspecified 05/13/2017, 03/26/2018, 04/06/2019, 04/05/2020, 04/04/2023   Janssen (J&J) SARS-COV-2 Vaccination 09/16/2019   Pneumococcal Conjugate-13 11/06/2016   Pneumococcal Polysaccharide-23 11/05/2017   Respiratory Syncytial Virus Vaccine,Recomb Aduvanted(Arexvy) 06/24/2023   Td 04/11/1994, 09/13/2008   Tdap 08/14/2018   CT Lung Screen - consider in the future after above CT  No orders of the defined types were placed in this encounter.  No orders of the defined types were placed in this encounter.   No follow-ups on file.  I have spent a total time of***-minutes on the day of the appointment including chart review, data review,  collecting history, coordinating care and discussing medical diagnosis and plan with the patient/family. Past medical history, allergies, medications were reviewed. Pertinent imaging, labs and tests included in this note have been reviewed and interpreted independently by me.  Rheta Hemmelgarn Slater Staff, MD [facility] 05/01/2024 9:51 AM

## 2024-05-06 DIAGNOSIS — H35033 Hypertensive retinopathy, bilateral: Secondary | ICD-10-CM | POA: Diagnosis not present

## 2024-05-06 DIAGNOSIS — H35363 Drusen (degenerative) of macula, bilateral: Secondary | ICD-10-CM | POA: Diagnosis not present

## 2024-05-06 DIAGNOSIS — H3554 Dystrophies primarily involving the retinal pigment epithelium: Secondary | ICD-10-CM | POA: Diagnosis not present

## 2024-05-06 DIAGNOSIS — H25813 Combined forms of age-related cataract, bilateral: Secondary | ICD-10-CM | POA: Diagnosis not present

## 2024-05-19 DIAGNOSIS — H35033 Hypertensive retinopathy, bilateral: Secondary | ICD-10-CM | POA: Diagnosis not present

## 2024-05-19 DIAGNOSIS — H25813 Combined forms of age-related cataract, bilateral: Secondary | ICD-10-CM | POA: Diagnosis not present

## 2024-05-19 DIAGNOSIS — H3554 Dystrophies primarily involving the retinal pigment epithelium: Secondary | ICD-10-CM | POA: Diagnosis not present

## 2024-06-22 ENCOUNTER — Telehealth: Payer: Self-pay | Admitting: Internal Medicine

## 2024-06-22 NOTE — Telephone Encounter (Signed)
 Patient scheduled for [facility] w/ Wendolyn due to current PCP, Dr. Norleen is retiring. Is this okay with you? Please advise.

## 2024-06-30 ENCOUNTER — Encounter (INDEPENDENT_AMBULATORY_CARE_PROVIDER_SITE_OTHER): Payer: Self-pay | Admitting: *Deleted

## 2024-07-21 ENCOUNTER — Ambulatory Visit (HOSPITAL_BASED_OUTPATIENT_CLINIC_OR_DEPARTMENT_OTHER): Admitting: Family Medicine

## 2024-07-23 ENCOUNTER — Ambulatory Visit: Admitting: Internal Medicine

## 2024-09-28 ENCOUNTER — Encounter: Admitting: Family Medicine

## 2024-10-01 ENCOUNTER — Ambulatory Visit

## 2024-10-01 ENCOUNTER — Encounter: Admitting: Internal Medicine
